# Patient Record
Sex: Male | Born: 1950
Health system: Southern US, Community
[De-identification: ages and names within clinical notes are randomized; demographics above are authoritative.]

## PROBLEM LIST (undated history)

## (undated) DIAGNOSIS — I712 Thoracic aortic aneurysm, without rupture: Secondary | ICD-10-CM

## (undated) DIAGNOSIS — K5100 Ulcerative (chronic) pancolitis without complications: Secondary | ICD-10-CM

## (undated) DIAGNOSIS — C801 Malignant (primary) neoplasm, unspecified: Secondary | ICD-10-CM

## (undated) DIAGNOSIS — F411 Generalized anxiety disorder: Secondary | ICD-10-CM

## (undated) DIAGNOSIS — Z973 Presence of spectacles and contact lenses: Secondary | ICD-10-CM

## (undated) DIAGNOSIS — H409 Unspecified glaucoma: Secondary | ICD-10-CM

## (undated) DIAGNOSIS — E785 Hyperlipidemia, unspecified: Secondary | ICD-10-CM

## (undated) DIAGNOSIS — H269 Unspecified cataract: Secondary | ICD-10-CM

## (undated) DIAGNOSIS — M8580 Other specified disorders of bone density and structure, unspecified site: Secondary | ICD-10-CM

## (undated) DIAGNOSIS — J309 Allergic rhinitis, unspecified: Secondary | ICD-10-CM

## (undated) DIAGNOSIS — W19XXXA Unspecified fall, initial encounter: Secondary | ICD-10-CM

## (undated) DIAGNOSIS — I2510 Atherosclerotic heart disease of native coronary artery without angina pectoris: Secondary | ICD-10-CM

## (undated) DIAGNOSIS — H44009 Unspecified purulent endophthalmitis, unspecified eye: Secondary | ICD-10-CM

## (undated) DIAGNOSIS — T7840XA Allergy, unspecified, initial encounter: Secondary | ICD-10-CM

## (undated) DIAGNOSIS — I7121 Aneurysm of the ascending aorta, without rupture: Secondary | ICD-10-CM

## (undated) DIAGNOSIS — R351 Nocturia: Secondary | ICD-10-CM

## (undated) DIAGNOSIS — Z87898 Personal history of other specified conditions: Secondary | ICD-10-CM

## (undated) HISTORY — DX: Hyperlipidemia, unspecified: E78.5

## (undated) HISTORY — PX: COLONOSCOPY: SHX174

## (undated) HISTORY — DX: Unspecified cataract: H26.9

## (undated) HISTORY — PX: EYE SURGERY: SHX253

## (undated) HISTORY — DX: Other specified disorders of bone density and structure, unspecified site: M85.80

## (undated) HISTORY — PX: CARDIAC CATHETERIZATION: SHX172

## (undated) HISTORY — DX: Malignant (primary) neoplasm, unspecified: C80.1

## (undated) HISTORY — DX: Generalized anxiety disorder: F41.1

## (undated) HISTORY — PX: CORONARY ANGIOPLASTY WITH STENT PLACEMENT: SHX49

## (undated) HISTORY — PX: TRANSTHORACIC ECHOCARDIOGRAM: SHX275

## (undated) HISTORY — DX: Allergy, unspecified, initial encounter: T78.40XA

## (undated) HISTORY — PX: CARDIOVASCULAR STRESS TEST: SHX262

## (undated) HISTORY — DX: Atherosclerotic heart disease of native coronary artery without angina pectoris: I25.10

## (undated) HISTORY — DX: Unspecified fall, initial encounter: W19.XXXA

## (undated) HISTORY — DX: Unspecified purulent endophthalmitis, unspecified eye: H44.009

---
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
## 1985-01-17 HISTORY — PX: SEPTOPLASTY: SUR1290

## 1998-04-15 ENCOUNTER — Ambulatory Visit (HOSPITAL_COMMUNITY): Admission: RE | Admit: 1998-04-15 | Discharge: 1998-04-15 | Payer: Self-pay | Admitting: Gastroenterology

## 1999-04-21 ENCOUNTER — Ambulatory Visit (HOSPITAL_COMMUNITY): Admission: RE | Admit: 1999-04-21 | Discharge: 1999-04-21 | Payer: Self-pay | Admitting: Gastroenterology

## 1999-04-21 ENCOUNTER — Encounter (INDEPENDENT_AMBULATORY_CARE_PROVIDER_SITE_OTHER): Payer: Self-pay | Admitting: Specialist

## 2000-04-28 ENCOUNTER — Ambulatory Visit (HOSPITAL_COMMUNITY): Admission: RE | Admit: 2000-04-28 | Discharge: 2000-04-28 | Payer: Self-pay | Admitting: Gastroenterology

## 2000-10-13 ENCOUNTER — Encounter (INDEPENDENT_AMBULATORY_CARE_PROVIDER_SITE_OTHER): Payer: Self-pay | Admitting: Specialist

## 2000-10-13 ENCOUNTER — Ambulatory Visit (HOSPITAL_COMMUNITY): Admission: RE | Admit: 2000-10-13 | Discharge: 2000-10-13 | Payer: Self-pay | Admitting: Gastroenterology

## 2002-07-25 ENCOUNTER — Ambulatory Visit (HOSPITAL_COMMUNITY): Admission: RE | Admit: 2002-07-25 | Discharge: 2002-07-26 | Payer: Self-pay | Admitting: Cardiology

## 2003-04-04 ENCOUNTER — Ambulatory Visit (HOSPITAL_BASED_OUTPATIENT_CLINIC_OR_DEPARTMENT_OTHER): Admission: RE | Admit: 2003-04-04 | Discharge: 2003-04-04 | Payer: Self-pay | Admitting: Pulmonary Disease

## 2003-12-30 ENCOUNTER — Ambulatory Visit: Payer: Self-pay | Admitting: Pulmonary Disease

## 2004-01-16 ENCOUNTER — Ambulatory Visit: Payer: Self-pay | Admitting: Internal Medicine

## 2004-01-18 HISTORY — PX: INGUINAL HERNIA REPAIR: SUR1180

## 2004-01-28 ENCOUNTER — Ambulatory Visit: Payer: Self-pay | Admitting: Pulmonary Disease

## 2004-01-30 ENCOUNTER — Ambulatory Visit: Payer: Self-pay | Admitting: Gastroenterology

## 2004-02-20 ENCOUNTER — Ambulatory Visit: Payer: Self-pay | Admitting: Gastroenterology

## 2004-02-27 ENCOUNTER — Ambulatory Visit: Payer: Self-pay | Admitting: Gastroenterology

## 2004-03-09 ENCOUNTER — Ambulatory Visit: Payer: Self-pay | Admitting: Cardiology

## 2004-03-26 ENCOUNTER — Ambulatory Visit: Payer: Self-pay | Admitting: Gastroenterology

## 2004-05-13 ENCOUNTER — Ambulatory Visit: Payer: Self-pay | Admitting: Gastroenterology

## 2004-10-25 ENCOUNTER — Ambulatory Visit (HOSPITAL_COMMUNITY): Admission: RE | Admit: 2004-10-25 | Discharge: 2004-10-25 | Payer: Self-pay | Admitting: Family Medicine

## 2004-11-24 ENCOUNTER — Encounter: Admission: RE | Admit: 2004-11-24 | Discharge: 2004-11-24 | Payer: Self-pay | Admitting: Surgery

## 2004-12-24 ENCOUNTER — Ambulatory Visit: Payer: Self-pay | Admitting: Gastroenterology

## 2005-03-08 ENCOUNTER — Encounter: Admission: RE | Admit: 2005-03-08 | Discharge: 2005-03-08 | Payer: Self-pay | Admitting: Emergency Medicine

## 2005-03-14 ENCOUNTER — Ambulatory Visit: Payer: Self-pay | Admitting: Gastroenterology

## 2005-03-18 ENCOUNTER — Encounter: Admission: RE | Admit: 2005-03-18 | Discharge: 2005-03-18 | Payer: Self-pay | Admitting: Cardiology

## 2005-03-18 ENCOUNTER — Ambulatory Visit: Payer: Self-pay | Admitting: Oncology

## 2005-03-29 ENCOUNTER — Encounter (INDEPENDENT_AMBULATORY_CARE_PROVIDER_SITE_OTHER): Payer: Self-pay | Admitting: Specialist

## 2005-03-29 ENCOUNTER — Ambulatory Visit: Payer: Self-pay | Admitting: Gastroenterology

## 2005-04-27 LAB — CBC WITH DIFFERENTIAL/PLATELET
BASO%: 1.7 % (ref 0.0–2.0)
EOS%: 3.7 % (ref 0.0–7.0)
HCT: 42.8 % (ref 38.7–49.9)
LYMPH%: 22.4 % (ref 14.0–48.0)
MCH: 36.5 pg — ABNORMAL HIGH (ref 28.0–33.4)
MCHC: 34.4 g/dL (ref 32.0–35.9)
NEUT%: 63.2 % (ref 40.0–75.0)
Platelets: 191 10*3/uL (ref 145–400)
RBC: 4.03 10*6/uL — ABNORMAL LOW (ref 4.20–5.71)

## 2006-04-17 ENCOUNTER — Ambulatory Visit: Payer: Self-pay | Admitting: Gastroenterology

## 2006-04-17 LAB — CONVERTED CEMR LAB
ALT: 12 units/L (ref 0–40)
AST: 26 units/L (ref 0–37)
Albumin: 3.8 g/dL (ref 3.5–5.2)
Alkaline Phosphatase: 47 units/L (ref 39–117)
Basophils Absolute: 0.1 10*3/uL (ref 0.0–0.1)
Basophils Relative: 1.6 % — ABNORMAL HIGH (ref 0.0–1.0)
Bilirubin, Direct: 0.1 mg/dL (ref 0.0–0.3)
Eosinophils Absolute: 0.1 10*3/uL (ref 0.0–0.6)
Eosinophils Relative: 2.8 % (ref 0.0–5.0)
HCT: 43.1 % (ref 39.0–52.0)
Hemoglobin: 15 g/dL (ref 13.0–17.0)
Lymphocytes Relative: 25.7 % (ref 12.0–46.0)
MCHC: 34.7 g/dL (ref 30.0–36.0)
MCV: 101.1 fL — ABNORMAL HIGH (ref 78.0–100.0)
Monocytes Absolute: 0.4 10*3/uL (ref 0.2–0.7)
Monocytes Relative: 8.4 % (ref 3.0–11.0)
Neutro Abs: 3.3 10*3/uL (ref 1.4–7.7)
Neutrophils Relative %: 61.5 % (ref 43.0–77.0)
Platelets: 255 10*3/uL (ref 150–400)
RBC: 4.27 M/uL (ref 4.22–5.81)
RDW: 13.2 % (ref 11.5–14.6)
Total Bilirubin: 1 mg/dL (ref 0.3–1.2)
Total Protein: 7.3 g/dL (ref 6.0–8.3)
WBC: 5.2 10*3/uL (ref 4.5–10.5)

## 2006-05-02 ENCOUNTER — Ambulatory Visit: Payer: Self-pay | Admitting: Gastroenterology

## 2006-05-02 ENCOUNTER — Encounter (INDEPENDENT_AMBULATORY_CARE_PROVIDER_SITE_OTHER): Payer: Self-pay | Admitting: Specialist

## 2006-10-20 ENCOUNTER — Ambulatory Visit: Payer: Self-pay | Admitting: Gastroenterology

## 2006-10-20 LAB — CONVERTED CEMR LAB
ALT: 24 units/L (ref 0–53)
AST: 32 units/L (ref 0–37)
Alkaline Phosphatase: 50 units/L (ref 39–117)
Basophils Absolute: 0.1 10*3/uL (ref 0.0–0.1)
Basophils Relative: 1.2 % — ABNORMAL HIGH (ref 0.0–1.0)
Bilirubin, Direct: 0.2 mg/dL (ref 0.0–0.3)
Eosinophils Absolute: 0.1 10*3/uL (ref 0.0–0.6)
Eosinophils Relative: 2.8 % (ref 0.0–5.0)
HCT: 40.1 % (ref 39.0–52.0)
Hemoglobin: 13.8 g/dL (ref 13.0–17.0)
Lymphocytes Relative: 27.1 % (ref 12.0–46.0)
MCHC: 34.3 g/dL (ref 30.0–36.0)
MCV: 101.8 fL — ABNORMAL HIGH (ref 78.0–100.0)
Monocytes Relative: 9.5 % (ref 3.0–11.0)
Neutrophils Relative %: 59.4 % (ref 43.0–77.0)
Platelets: 246 10*3/uL (ref 150–400)
RBC: 3.94 M/uL — ABNORMAL LOW (ref 4.22–5.81)
RDW: 14.6 % (ref 11.5–14.6)
Total Bilirubin: 1.4 mg/dL — ABNORMAL HIGH (ref 0.3–1.2)
Total Protein: 7.4 g/dL (ref 6.0–8.3)
WBC: 4.4 10*3/uL — ABNORMAL LOW (ref 4.5–10.5)

## 2007-01-24 ENCOUNTER — Ambulatory Visit: Payer: Self-pay | Admitting: Gastroenterology

## 2007-01-24 LAB — CONVERTED CEMR LAB
ALT: 17 units/L (ref 0–53)
AST: 26 units/L (ref 0–37)
Albumin: 4 g/dL (ref 3.5–5.2)
Alkaline Phosphatase: 43 units/L (ref 39–117)
Basophils Absolute: 0 10*3/uL (ref 0.0–0.1)
Basophils Relative: 0 % (ref 0.0–1.0)
Bilirubin, Direct: 0.3 mg/dL (ref 0.0–0.3)
Eosinophils Absolute: 0.1 10*3/uL (ref 0.0–0.6)
Eosinophils Relative: 2.1 % (ref 0.0–5.0)
HCT: 40.8 % (ref 39.0–52.0)
Hemoglobin: 14.6 g/dL (ref 13.0–17.0)
Lymphocytes Relative: 18.6 % (ref 12.0–46.0)
MCHC: 35.9 g/dL (ref 30.0–36.0)
MCV: 103.1 fL — ABNORMAL HIGH (ref 78.0–100.0)
Monocytes Absolute: 0.3 10*3/uL (ref 0.2–0.7)
Monocytes Relative: 5.7 % (ref 3.0–11.0)
Neutro Abs: 4.5 10*3/uL (ref 1.4–7.7)
Neutrophils Relative %: 73.6 % (ref 43.0–77.0)
Platelets: 194 10*3/uL (ref 150–400)
RBC: 3.96 M/uL — ABNORMAL LOW (ref 4.22–5.81)
RDW: 15.8 % — ABNORMAL HIGH (ref 11.5–14.6)
Total Bilirubin: 2 mg/dL — ABNORMAL HIGH (ref 0.3–1.2)
Total Protein: 7.3 g/dL (ref 6.0–8.3)
WBC: 6 10*3/uL (ref 4.5–10.5)

## 2007-04-03 ENCOUNTER — Encounter: Admission: RE | Admit: 2007-04-03 | Discharge: 2007-04-03 | Payer: Self-pay | Admitting: Family Medicine

## 2007-05-17 ENCOUNTER — Ambulatory Visit: Payer: Self-pay | Admitting: Gastroenterology

## 2007-05-17 DIAGNOSIS — K5190 Ulcerative colitis, unspecified, without complications: Secondary | ICD-10-CM | POA: Insufficient documentation

## 2007-05-17 LAB — CONVERTED CEMR LAB
ALT: 18 units/L (ref 0–53)
AST: 26 units/L (ref 0–37)
Albumin: 4.3 g/dL (ref 3.5–5.2)
Alkaline Phosphatase: 48 units/L (ref 39–117)
Basophils Absolute: 0 10*3/uL (ref 0.0–0.1)
Basophils Relative: 0.8 % (ref 0.0–1.0)
Bilirubin, Direct: 0.1 mg/dL (ref 0.0–0.3)
Eosinophils Absolute: 0.1 10*3/uL (ref 0.0–0.7)
Eosinophils Relative: 2.8 % (ref 0.0–5.0)
HCT: 41.6 % (ref 39.0–52.0)
Hemoglobin: 14.1 g/dL (ref 13.0–17.0)
Lymphocytes Relative: 23.3 % (ref 12.0–46.0)
MCHC: 33.9 g/dL (ref 30.0–36.0)
MCV: 108.9 fL — ABNORMAL HIGH (ref 78.0–100.0)
Monocytes Absolute: 0.3 10*3/uL (ref 0.1–1.0)
Monocytes Relative: 6.2 % (ref 3.0–12.0)
Neutro Abs: 3.1 10*3/uL (ref 1.4–7.7)
Neutrophils Relative %: 66.9 % (ref 43.0–77.0)
Platelets: 216 10*3/uL (ref 150–400)
RBC: 3.82 M/uL — ABNORMAL LOW (ref 4.22–5.81)
RDW: 15.7 % — ABNORMAL HIGH (ref 11.5–14.6)
Total Bilirubin: 1.5 mg/dL — ABNORMAL HIGH (ref 0.3–1.2)
Total Protein: 7.6 g/dL (ref 6.0–8.3)
WBC: 4.6 10*3/uL (ref 4.5–10.5)

## 2007-06-07 ENCOUNTER — Encounter: Payer: Self-pay | Admitting: Gastroenterology

## 2007-06-07 ENCOUNTER — Ambulatory Visit: Payer: Self-pay | Admitting: Gastroenterology

## 2007-06-13 ENCOUNTER — Encounter: Payer: Self-pay | Admitting: Gastroenterology

## 2007-08-06 ENCOUNTER — Telehealth: Payer: Self-pay | Admitting: Gastroenterology

## 2007-08-13 ENCOUNTER — Encounter: Payer: Self-pay | Admitting: Gastroenterology

## 2007-08-13 ENCOUNTER — Telehealth: Payer: Self-pay | Admitting: Gastroenterology

## 2007-08-15 ENCOUNTER — Encounter: Payer: Self-pay | Admitting: Gastroenterology

## 2007-11-07 ENCOUNTER — Telehealth: Payer: Self-pay | Admitting: Gastroenterology

## 2007-11-27 ENCOUNTER — Emergency Department (HOSPITAL_COMMUNITY): Admission: EM | Admit: 2007-11-27 | Discharge: 2007-11-27 | Payer: Self-pay | Admitting: Emergency Medicine

## 2007-12-03 ENCOUNTER — Telehealth: Payer: Self-pay | Admitting: Gastroenterology

## 2007-12-03 ENCOUNTER — Encounter (HOSPITAL_COMMUNITY): Admission: RE | Admit: 2007-12-03 | Discharge: 2008-01-17 | Payer: Self-pay | Admitting: Emergency Medicine

## 2008-01-02 ENCOUNTER — Encounter: Payer: Self-pay | Admitting: Internal Medicine

## 2008-01-30 ENCOUNTER — Telehealth: Payer: Self-pay | Admitting: Gastroenterology

## 2008-06-03 ENCOUNTER — Encounter (INDEPENDENT_AMBULATORY_CARE_PROVIDER_SITE_OTHER): Payer: Self-pay | Admitting: *Deleted

## 2008-07-01 ENCOUNTER — Ambulatory Visit: Payer: Self-pay | Admitting: Gastroenterology

## 2008-07-01 ENCOUNTER — Telehealth: Payer: Self-pay | Admitting: Gastroenterology

## 2008-07-01 LAB — CONVERTED CEMR LAB
ALT: 36 units/L (ref 0–53)
AST: 34 units/L (ref 0–37)
Albumin: 3.9 g/dL (ref 3.5–5.2)
Alkaline Phosphatase: 38 units/L — ABNORMAL LOW (ref 39–117)
Basophils Absolute: 0.1 10*3/uL (ref 0.0–0.1)
Basophils Relative: 1.4 % (ref 0.0–3.0)
Bilirubin, Direct: 0.3 mg/dL (ref 0.0–0.3)
Eosinophils Absolute: 0.1 10*3/uL (ref 0.0–0.7)
Eosinophils Relative: 2.1 % (ref 0.0–5.0)
HCT: 39.4 % (ref 39.0–52.0)
Hemoglobin: 13.9 g/dL (ref 13.0–17.0)
Lymphocytes Relative: 19.1 % (ref 12.0–46.0)
Lymphs Abs: 1 10*3/uL (ref 0.7–4.0)
MCHC: 35.3 g/dL (ref 30.0–36.0)
MCV: 111.7 fL — ABNORMAL HIGH (ref 78.0–100.0)
Monocytes Absolute: 0.3 10*3/uL (ref 0.1–1.0)
Monocytes Relative: 5.7 % (ref 3.0–12.0)
Neutro Abs: 3.5 10*3/uL (ref 1.4–7.7)
Neutrophils Relative %: 71.7 % (ref 43.0–77.0)
Platelets: 161 10*3/uL (ref 150.0–400.0)
RBC: 3.52 M/uL — ABNORMAL LOW (ref 4.22–5.81)
RDW: 15.1 % — ABNORMAL HIGH (ref 11.5–14.6)
Total Bilirubin: 1.8 mg/dL — ABNORMAL HIGH (ref 0.3–1.2)
Total Protein: 7 g/dL (ref 6.0–8.3)
WBC: 5 10*3/uL (ref 4.5–10.5)

## 2008-09-02 ENCOUNTER — Ambulatory Visit: Payer: Self-pay | Admitting: Gastroenterology

## 2008-09-12 ENCOUNTER — Telehealth: Payer: Self-pay | Admitting: Gastroenterology

## 2008-10-10 ENCOUNTER — Encounter: Payer: Self-pay | Admitting: Gastroenterology

## 2008-10-10 ENCOUNTER — Ambulatory Visit: Payer: Self-pay | Admitting: Gastroenterology

## 2008-10-10 LAB — HM COLONOSCOPY

## 2008-10-15 ENCOUNTER — Encounter: Payer: Self-pay | Admitting: Gastroenterology

## 2008-12-02 ENCOUNTER — Telehealth: Payer: Self-pay | Admitting: Gastroenterology

## 2008-12-17 ENCOUNTER — Telehealth: Payer: Self-pay | Admitting: Gastroenterology

## 2008-12-19 ENCOUNTER — Encounter: Payer: Self-pay | Admitting: Internal Medicine

## 2008-12-19 ENCOUNTER — Encounter: Payer: Self-pay | Admitting: Gastroenterology

## 2008-12-19 LAB — CONVERTED CEMR LAB
ALT: 30 units/L
AST: 35 units/L
Albumin: 4.3 g/dL
Alkaline Phosphatase: 30 units/L
BUN: 15 mg/dL
Basophils Relative: 1.4 %
CO2: 29 meq/L
Calcium: 9 mg/dL
Chloride: 103 meq/L
Cholesterol: 165 mg/dL
Creatinine, Ser: 0.9 mg/dL
Eosinophils Relative: 3.5 %
Glucose, Bld: 89 mg/dL
HCT: 40.7 %
HDL: 61 mg/dL
Hemoglobin: 14 g/dL
LDL Cholesterol: 89 mg/dL
Lymphocytes, automated: 28.5 %
MCV: 110.3 fL
Monocytes Relative: 4 %
Neutrophils Relative %: 62.6 %
PSA: 2.66 ng/mL
Platelets: 181 10*3/uL
Potassium: 3.8 meq/L
RBC: 3.69 M/uL
RDW: 15.6 %
Sodium: 139 meq/L
TSH: 1.5 microintl units/mL
Total Bilirubin: 1.2 mg/dL
Total Protein: 7 g/dL
Triglyceride fasting, serum: 46 mg/dL
WBC: 3.9 10*3/uL

## 2008-12-30 ENCOUNTER — Telehealth: Payer: Self-pay | Admitting: Gastroenterology

## 2009-01-13 ENCOUNTER — Encounter: Payer: Self-pay | Admitting: Internal Medicine

## 2009-03-16 ENCOUNTER — Telehealth: Payer: Self-pay | Admitting: Gastroenterology

## 2009-03-23 ENCOUNTER — Telehealth: Payer: Self-pay | Admitting: Gastroenterology

## 2009-05-15 ENCOUNTER — Ambulatory Visit: Payer: Self-pay | Admitting: Internal Medicine

## 2009-05-15 DIAGNOSIS — M8580 Other specified disorders of bone density and structure, unspecified site: Secondary | ICD-10-CM | POA: Insufficient documentation

## 2009-05-15 DIAGNOSIS — M810 Age-related osteoporosis without current pathological fracture: Secondary | ICD-10-CM | POA: Insufficient documentation

## 2009-05-15 DIAGNOSIS — F411 Generalized anxiety disorder: Secondary | ICD-10-CM | POA: Insufficient documentation

## 2009-05-18 ENCOUNTER — Ambulatory Visit: Payer: Self-pay | Admitting: Internal Medicine

## 2009-05-18 LAB — CONVERTED CEMR LAB
ALT: 33 units/L (ref 0–53)
AST: 33 units/L (ref 0–37)
Albumin: 4.2 g/dL (ref 3.5–5.2)
Alkaline Phosphatase: 37 units/L — ABNORMAL LOW (ref 39–117)
BUN: 13 mg/dL (ref 6–23)
Basophils Absolute: 0 10*3/uL (ref 0.0–0.1)
Basophils Relative: 0.3 % (ref 0.0–3.0)
Bilirubin Urine: NEGATIVE
Bilirubin, Direct: 0.2 mg/dL (ref 0.0–0.3)
CO2: 32 meq/L (ref 19–32)
Calcium: 9.2 mg/dL (ref 8.4–10.5)
Chloride: 106 meq/L (ref 96–112)
Cholesterol: 157 mg/dL (ref 0–200)
Creatinine, Ser: 1 mg/dL (ref 0.4–1.5)
Eosinophils Absolute: 0.1 10*3/uL (ref 0.0–0.7)
Eosinophils Relative: 3.4 % (ref 0.0–5.0)
GFR calc non Af Amer: 81.82 mL/min (ref >60)
Glucose, Bld: 92 mg/dL (ref 70–99)
HCT: 38.8 % — ABNORMAL LOW (ref 39.0–52.0)
HDL: 45.9 mg/dL (ref >39.00)
Hemoglobin: 13.6 g/dL (ref 13.0–17.0)
Ketones, ur: NEGATIVE mg/dL
LDL Cholesterol: 98 mg/dL (ref 0–99)
Leukocytes, UA: NEGATIVE
Lymphocytes Relative: 27.3 % (ref 12.0–46.0)
Lymphs Abs: 0.8 10*3/uL (ref 0.7–4.0)
MCHC: 35 g/dL (ref 30.0–36.0)
MCV: 111.6 fL — ABNORMAL HIGH (ref 78.0–100.0)
Monocytes Absolute: 0.2 10*3/uL (ref 0.1–1.0)
Monocytes Relative: 6.7 % (ref 3.0–12.0)
Neutro Abs: 1.9 10*3/uL (ref 1.4–7.7)
Neutrophils Relative %: 62.3 % (ref 43.0–77.0)
Nitrite: NEGATIVE
PSA: 2.59 ng/mL (ref 0.10–4.00)
Platelets: 203 10*3/uL (ref 150.0–400.0)
Potassium: 4 meq/L (ref 3.5–5.1)
RBC: 3.47 M/uL — ABNORMAL LOW (ref 4.22–5.81)
RDW: 16.6 % — ABNORMAL HIGH (ref 11.5–14.6)
Sodium: 143 meq/L (ref 135–145)
Specific Gravity, Urine: 1.025 (ref 1.000–1.030)
TSH: 1.36 microintl units/mL (ref 0.35–5.50)
Total Bilirubin: 1.3 mg/dL — ABNORMAL HIGH (ref 0.3–1.2)
Total CHOL/HDL Ratio: 3
Total Protein, Urine: NEGATIVE mg/dL
Total Protein: 6.7 g/dL (ref 6.0–8.3)
Triglycerides: 64 mg/dL (ref 0.0–149.0)
Urine Glucose: NEGATIVE mg/dL
Urobilinogen, UA: 0.2 (ref 0.0–1.0)
VLDL: 12.8 mg/dL (ref 0.0–40.0)
WBC: 3.1 10*3/uL — ABNORMAL LOW (ref 4.5–10.5)
pH: 5.5 (ref 5.0–8.0)

## 2009-05-20 ENCOUNTER — Telehealth (INDEPENDENT_AMBULATORY_CARE_PROVIDER_SITE_OTHER): Payer: Self-pay | Admitting: *Deleted

## 2009-06-02 ENCOUNTER — Telehealth (INDEPENDENT_AMBULATORY_CARE_PROVIDER_SITE_OTHER): Payer: Self-pay | Admitting: *Deleted

## 2009-06-04 ENCOUNTER — Encounter: Payer: Self-pay | Admitting: Internal Medicine

## 2009-06-04 DIAGNOSIS — Z9889 Other specified postprocedural states: Secondary | ICD-10-CM | POA: Insufficient documentation

## 2009-06-04 DIAGNOSIS — E559 Vitamin D deficiency, unspecified: Secondary | ICD-10-CM | POA: Insufficient documentation

## 2009-06-04 DIAGNOSIS — D509 Iron deficiency anemia, unspecified: Secondary | ICD-10-CM | POA: Insufficient documentation

## 2009-06-04 DIAGNOSIS — J309 Allergic rhinitis, unspecified: Secondary | ICD-10-CM | POA: Insufficient documentation

## 2009-06-04 DIAGNOSIS — K219 Gastro-esophageal reflux disease without esophagitis: Secondary | ICD-10-CM | POA: Insufficient documentation

## 2009-06-08 ENCOUNTER — Telehealth: Payer: Self-pay | Admitting: Internal Medicine

## 2009-06-11 ENCOUNTER — Encounter: Payer: Self-pay | Admitting: Cardiology

## 2009-06-12 ENCOUNTER — Ambulatory Visit: Payer: Self-pay | Admitting: Cardiology

## 2009-06-12 DIAGNOSIS — E7800 Pure hypercholesterolemia, unspecified: Secondary | ICD-10-CM | POA: Insufficient documentation

## 2009-06-12 DIAGNOSIS — E785 Hyperlipidemia, unspecified: Secondary | ICD-10-CM | POA: Insufficient documentation

## 2009-06-12 HISTORY — DX: Pure hypercholesterolemia, unspecified: E78.00

## 2009-08-07 ENCOUNTER — Ambulatory Visit: Payer: Self-pay | Admitting: Cardiology

## 2009-08-13 LAB — CONVERTED CEMR LAB
ALT: 26 units/L (ref 0–53)
AST: 34 units/L (ref 0–37)
Albumin: 4 g/dL (ref 3.5–5.2)
Alkaline Phosphatase: 39 units/L (ref 39–117)
Bilirubin, Direct: 0.2 mg/dL (ref 0.0–0.3)
Cholesterol: 139 mg/dL (ref 0–200)
HDL: 53.4 mg/dL (ref >39.00)
LDL Cholesterol: 80 mg/dL (ref 0–99)
Total Bilirubin: 1 mg/dL (ref 0.3–1.2)
Total CHOL/HDL Ratio: 3
Total Protein: 6.7 g/dL (ref 6.0–8.3)
Triglycerides: 28 mg/dL (ref 0.0–149.0)
VLDL: 5.6 mg/dL (ref 0.0–40.0)

## 2009-09-23 ENCOUNTER — Ambulatory Visit: Payer: Self-pay

## 2009-09-23 ENCOUNTER — Ambulatory Visit: Payer: Self-pay | Admitting: Internal Medicine

## 2009-09-23 DIAGNOSIS — M79609 Pain in unspecified limb: Secondary | ICD-10-CM | POA: Insufficient documentation

## 2009-10-07 ENCOUNTER — Encounter: Payer: Self-pay | Admitting: Gastroenterology

## 2009-10-07 ENCOUNTER — Encounter: Payer: Self-pay | Admitting: Internal Medicine

## 2009-12-07 ENCOUNTER — Telehealth: Payer: Self-pay | Admitting: Gastroenterology

## 2009-12-14 ENCOUNTER — Telehealth: Payer: Self-pay | Admitting: Gastroenterology

## 2009-12-21 ENCOUNTER — Ambulatory Visit: Payer: Self-pay | Admitting: Internal Medicine

## 2009-12-21 DIAGNOSIS — J019 Acute sinusitis, unspecified: Secondary | ICD-10-CM | POA: Insufficient documentation

## 2010-01-19 ENCOUNTER — Telehealth: Payer: Self-pay | Admitting: Gastroenterology

## 2010-01-19 ENCOUNTER — Encounter: Payer: Self-pay | Admitting: Gastroenterology

## 2010-01-19 ENCOUNTER — Ambulatory Visit
Admission: RE | Admit: 2010-01-19 | Discharge: 2010-01-19 | Payer: Self-pay | Source: Home / Self Care | Attending: Gastroenterology | Admitting: Gastroenterology

## 2010-01-19 ENCOUNTER — Encounter: Payer: Self-pay | Admitting: Cardiology

## 2010-01-19 LAB — CONVERTED CEMR LAB
BUN: 12 mg/dL (ref 6–23)
Basophils Absolute: 0 10*3/uL (ref 0.0–0.1)
Basophils Relative: 0.5 % (ref 0.0–3.0)
CO2: 32 meq/L (ref 19–32)
Calcium: 9.2 mg/dL (ref 8.4–10.5)
Chloride: 104 meq/L (ref 96–112)
Creatinine, Ser: 1 mg/dL (ref 0.4–1.5)
Eosinophils Relative: 5.3 % — ABNORMAL HIGH (ref 0.0–5.0)
Glucose, Bld: 84 mg/dL (ref 70–99)
HCT: 41.6 % (ref 39.0–52.0)
Hemoglobin: 14.4 g/dL (ref 13.0–17.0)
Lymphocytes Relative: 23.6 % (ref 12.0–46.0)
Lymphs Abs: 0.9 10*3/uL (ref 0.7–4.0)
MCV: 111 fL — ABNORMAL HIGH (ref 78.0–100.0)
Monocytes Absolute: 0.3 10*3/uL (ref 0.1–1.0)
Monocytes Relative: 7.1 % (ref 3.0–12.0)
Neutro Abs: 2.3 10*3/uL (ref 1.4–7.7)
Neutrophils Relative %: 63.5 % (ref 43.0–77.0)
Platelets: 156 10*3/uL (ref 150.0–400.0)
Potassium: 4.4 meq/L (ref 3.5–5.1)
RBC: 3.74 M/uL — ABNORMAL LOW (ref 4.22–5.81)
Sodium: 141 meq/L (ref 135–145)
WBC: 3.7 10*3/uL — ABNORMAL LOW (ref 4.5–10.5)

## 2010-01-21 ENCOUNTER — Encounter: Payer: Self-pay | Admitting: Cardiology

## 2010-02-04 ENCOUNTER — Other Ambulatory Visit: Payer: Self-pay | Admitting: Gastroenterology

## 2010-02-04 ENCOUNTER — Ambulatory Visit
Admission: RE | Admit: 2010-02-04 | Discharge: 2010-02-04 | Payer: Self-pay | Source: Home / Self Care | Attending: Gastroenterology | Admitting: Gastroenterology

## 2010-02-10 ENCOUNTER — Encounter: Payer: Self-pay | Admitting: Gastroenterology

## 2010-02-11 ENCOUNTER — Ambulatory Visit
Admission: RE | Admit: 2010-02-11 | Discharge: 2010-02-11 | Payer: Self-pay | Source: Home / Self Care | Attending: Cardiology | Admitting: Cardiology

## 2010-02-16 NOTE — Progress Notes (Signed)
Summary: Needs refill/PT PAST DUE FOR COLONOSCOPY  Medications Added PENTASA 500 MG CR-CAPS (MESALAMINE) 2 tablets by mouth four times a day       Phone Note Call from Patient Call back at Home Phone [phone]   Caller: Patient Call For: Dr. Arlyce Dice Reason for Call: Refill Medication Details for Reason: Refill Summary of Call: Pt. needs a refill for a 14-month supply of Pentasa faxed to [facility] [phone]. It will need to include his name, date of birth, and member ID# which is [id number].  Thank you. Initial call taken by: Schuyler Amor,  December 07, 2009 1:12 PM  Follow-up for Phone Call        Called pt to inform he is past due on Colonoscopy. And to follow up office appointment, L/M for pt to return call if he has any questions Follow-up by: Merri Ray CMA Duncan Dull),  December 07, 2009 4:21 PM    New/Updated Medications: PENTASA 500 MG CR-CAPS (MESALAMINE) 2 tablets by mouth four times a day Prescriptions: PENTASA 500 MG CR-CAPS (MESALAMINE) 2 tablets by mouth four times a day  #720 x 3   Entered by:   Merri Ray CMA (AAMA)   Authorized by:   Louis Meckel MD   Signed by:   Merri Ray CMA (AAMA) on 12/07/2009   Method used:   Faxed to ...       [facility] Environmental education officer)       [address] [address]       [place], [place]  [address]       Ph: [phone]       Fax: [phone]   RxID:   [id number]

## 2010-02-16 NOTE — Assessment & Plan Note (Signed)
Summary: SINUS PROBLEM--CHILLS --SORE THROAT  STC   Vital Signs:  Patient profile:   60 year old male Height:      68.5 inches (173.99 cm) Weight:      156.4 pounds (71.09 kg) O2 Sat:      96 % on Room air Temp:     99.0 degrees F (37.22 degrees C) oral Pulse rate:   74 / minute BP sitting:   110 / 80  (left arm) Cuff size:   regular  Vitals Entered By: Tomma Lightning RMA (December 21, 2009 11:30 AM)  O2 Flow:  Room air CC: sinus/chills/sore throat Is Patient Diabetic? No Pain Assessment Patient in pain? no        Primary Care Provider:  Rowe Clack MD  CC:  sinus/chills/sore throat.  History of Present Illness: c/o sinus pressure onset 4 days ago located above and behind both eyes assoc with chills but no fever, green nasal discharge and ST +sick contacts with similar symptoms  no fever, no HA  symptoms not improved with otc meds thus far   also review of other med issues:  CAD -  follows annually with cardiologist Ron Parker),  no CP or anginal symptoms - 100% compliant with meds  UC - no flares since 2009 - 100% compliant with meds as rx'd by GI - no adv se on current tx  osteoporosis - no bone pains - related to prior steroid use - not on fosamax for years and does not take Ca or vit d regularly  Current Medications (verified): 1)  Azathioprine 50 Mg  Tabs (Azathioprine) .... Take 3 1/2 Tablets Daily( 179m Per Day) 2)  Pentasa 500 Mg Cr-Caps (Mesalamine) .... 2 Tablets By Mouth Four Times A Day 3)  Lexapro 10 Mg Tabs (Escitalopram Oxalate) ..Marland Kitchen. 1 Tablet By Mouth Once Daily 4)  Aspirin 81 Mg Tbec (Aspirin) ..Marland Kitchen. 1 Tablet By Mouth Once Daily 5)  Niacin 500 Mg Tabs (Niacin) .... 2 Tablets By Mouth Once Daily 6)  Multivitamins  Tabs (Multiple Vitamin) ..Marland Kitchen. 1 Tablet By Mouth Once Daily 7)  B Complex  Tabs (B Complex Vitamins) ..Marland Kitchen. 1 Tablet By Mouth Once Daily 8)  Plavix 75 Mg Tabs (Clopidogrel Bisulfate) ..Marland Kitchen. 1 Tablet By Mouth Once Daily 9)  Eql Coq10 300 Mg Caps  (Coenzyme Q10) .... Once Daily 10)  Oscal 500/200 D-3 500-200 Mg-Unit Tabs (Calcium-Vitamin D) ..Marland Kitchen. 1 By Mouth Once Daily 11)  Simvastatin 10 Mg Tabs (Simvastatin) .... Take One Tablet By Mouth Daily At Bedtime  Allergies (verified): 1)  ! Demerol  Past History:  Past Medical History: CAD   Cypher stent..Marland KitchenLAD - 2004 (seen last cardiology 2006) /  nuclear scan... 2005... excellent tolerance... no significant scar or ischemia... EF 51% Ulcerative Colitis anxiety osteopenia - T score -2.1 Ulcerative colitis LV.. normal by history   GERD Allergic rhinitis  MD roster: GI - kaplan cards - prev torelli, now [place] - Kaur  optho - cohen derm - gruber  Review of Systems  The patient denies weight loss, vision loss, decreased hearing, hoarseness, chest pain, syncope, dyspnea on exertion, peripheral edema, prolonged cough, and headaches.    Physical Exam  General:  alert, well-developed, well-nourished, and cooperative to examination.   mild ill Head:  tenderness to palp over frontal sinuses Eyes:  vision grossly intact; pupils equal, round and reactive to light.  conjunctiva and lids normal.    Ears:  normal pinnae bilaterally, without erythema, swelling, or tenderness to  palpation. TMs clear, without effusion, or cerumen impaction. Hearing grossly normal bilaterally  Mouth:  teeth and gums in good repair; mucous membranes moist, without lesions or ulcers. oropharynx clear without exudate, mild erythema.  Lungs:  normal respiratory effort, no intercostal retractions or use of accessory muscles; normal breath sounds bilaterally - no crackles and no wheezes.    Heart:  normal rate, regular rhythm, no murmur, and no rub. BLE without edema.   Impression & Recommendations:  Problem # 1:  ACUTE SINUSITIS, UNSPECIFIED (ICD-461.9)  emperic levaquin - also rec otc meds - mucinex dm or tylenol cold and sinus Instructed on treatment. Call if symptoms persist or worsen.   His updated  medication list for this problem includes:    Levaquin 500 Mg Tabs (Levofloxacin) .Marland Kitchen... 1 by mouth once daily x 7days    Tylenol Cold Head Congestion 05-18-08-325 Mg Tabs (Phenyleph-cpm-dm-apap) .Marland Kitchen... As directed  Complete Medication List: 1)  Azathioprine 50 Mg Tabs (Azathioprine) .... Take 3 1/2 tablets daily( 134m per day) 2)  Pentasa 500 Mg Cr-caps (Mesalamine) .... 2 tablets by mouth four times a day 3)  Lexapro 10 Mg Tabs (Escitalopram oxalate) ..Marland Kitchen. 1 tablet by mouth once daily 4)  Aspirin 81 Mg Tbec (Aspirin) ..Marland Kitchen. 1 tablet by mouth once daily 5)  Niacin 500 Mg Tabs (Niacin) .... 2 tablets by mouth once daily 6)  Multivitamins Tabs (Multiple vitamin) ..Marland Kitchen. 1 tablet by mouth once daily 7)  B Complex Tabs (B complex vitamins) ..Marland Kitchen. 1 tablet by mouth once daily 8)  Plavix 75 Mg Tabs (Clopidogrel bisulfate) ..Marland Kitchen. 1 tablet by mouth once daily 9)  Eql Coq10 300 Mg Caps (Coenzyme q10) .... Once daily 10)  Oscal 500/200 D-3 500-200 Mg-unit Tabs (Calcium-vitamin d) ..Marland Kitchen. 1 by mouth once daily 11)  Simvastatin 10 Mg Tabs (Simvastatin) .... Take one tablet by mouth daily at bedtime 12)  Levaquin 500 Mg Tabs (Levofloxacin) ..Marland Kitchen. 1 by mouth once daily x 7days 13)  Tylenol Cold Head Congestion 05-18-08-325 Mg Tabs (Phenyleph-cpm-dm-apap) .... As directed  Patient Instructions: 1)  it was good to see you today. 2)  Levaquin as discussed for sinus symptoms - also Mucinex DM or other OTC cold formulation 3)  Get plenty of rest, drink lots of clear liquids, and use Tylenol or Ibuprofen for fever and comfort. Return in 7-10 days if you're not better:sooner if you're feeling worse. Prescriptions: LEVAQUIN 500 MG TABS (LEVOFLOXACIN) 1 by mouth once daily x 7days  #7 x 0   Entered and Authorized by:   VRowe ClackMD   Signed by:   VRowe ClackMD on 12/21/2009   Method used:   Print then Give to Patient   RxID:   [id number]   Orders Added: 1)  Est. Patient Level IV [[95284]

## 2010-02-16 NOTE — Assessment & Plan Note (Signed)
Summary: ec6/cad last seen 06  Medications Added EQL COQ10 300 MG CAPS (COENZYME Q10) once daily OSCAL 500/200 D-3 500-200 MG-UNIT TABS (CALCIUM-VITAMIN D) 1 by mouth once daily DOXYCYCLINE HYCLATE 100 MG CAPS (DOXYCYCLINE HYCLATE) once daily PROBIOTIC  CAPS (PROBIOTIC PRODUCT) once daily SIMVASTATIN 10 MG TABS (SIMVASTATIN) Take one tablet by mouth daily at bedtime        Visit Type:  reestablish cardiac care Primary Provider:  Rowe Clack MD  CC:  CAD.  History of Present Illness: The patient is seen to reestablish his cardiac care with our office.  He has known coronary disease.  He received a Cypher stent in 2004.  Nuclear scan in 2005 revealed excellent tolerance with no ischemia.  Ejection fraction was in the low 50% range in the past.  Over the past several years he has seen Dr.Torelli.  He had nuclear stress studies and echo stress studies.  The last one was done approximately 2 years ago.  He had no significant ischemia.  I do not have his left ventricular function data but I can assume that is normal.  Patient has a problem with chronic mild fatigue.  Etiology is unknown.  There is question that it may be related to his ulcerative colitis but this is not proven.  Currently he is not having any significant chest pain or shortness of breath.  Current Medications (verified): 1)  Azathioprine 50 Mg  Tabs (Azathioprine) .... Take 3 1/2 Tablets Daily( 165m Per Day) 2)  Pentasa 500 Mg Cr-Caps (Mesalamine) .... 2 Tablets By Mouth Four Times A Day 3)  Lexapro 10 Mg Tabs (Escitalopram Oxalate) ..Marland Kitchen. 1 Tablet By Mouth Once Daily 4)  Aspirin 81 Mg Tbec (Aspirin) ..Marland Kitchen. 1 Tablet By Mouth Once Daily 5)  Red Yeast Rice Extract 600 Mg Caps (Red Yeast Rice Extract) .... Take 4 By Mouth Once Daily 6)  Niacin 500 Mg Tabs (Niacin) .... 2 Tablets By Mouth Once Daily 7)  Multivitamins  Tabs (Multiple Vitamin) ..Marland Kitchen. 1 Tablet By Mouth Once Daily 8)  B Complex  Tabs (B Complex Vitamins) ..Marland Kitchen. 1  Tablet By Mouth Once Daily 9)  Plavix 75 Mg Tabs (Clopidogrel Bisulfate) ..Marland Kitchen. 1 Tablet By Mouth Once Daily 10)  Eql Coq10 300 Mg Caps (Coenzyme Q10) .... Once Daily 11)  Oscal 500/200 D-3 500-200 Mg-Unit Tabs (Calcium-Vitamin D) ..Marland Kitchen. 1 By Mouth Once Daily 12)  Doxycycline Hyclate 100 Mg Caps (Doxycycline Hyclate) .... Once Daily 13)  Probiotic  Caps (Probiotic Product) .... Once Daily  Allergies: 1)  ! Demerol  Past History:  Past Medical History: Last updated: 06/11/2009 CAD   Cypher stent..Marland KitchenLAD - 2004 (seen last cardiology 2006) /  nuclear scan... 2005... excellent tolerance... no significant scar or ischemia... EF 51% Ulcerative Colitis anxiety osteopenia - T score -2.1 Ulcerative colitis LV.. normal by history  MD rooster: GI - kaplan cards - torelli, (nishan) psyc - Kaur  optho - cohen derm - gruber  GERD Allergic rhinitis  Review of Systems       Patient denies fever, chills, headache, sweats, rash, change in vision, change in hearing, chest pain, cough, nausea or vomiting, urinary symptoms.  All other systems are reviewed and are negative  Vital Signs:  Patient profile:   60year old male Height:      68.5 inches Weight:      151 pounds BMI:     22.71 Pulse rate:   64 / minute BP sitting:   118 / 52  (  left arm)  Vitals Entered By: Margaretmary Bayley CMA (Jun 12, 2009 4:10 PM)  Physical Exam  General:  patient is stable in general. Head:  head is atraumatic. Eyes:  no xanthelasma. Neck:  no jugular venous stent. Chest Wall:  no chest wall tenderness. Lungs:  lungs are clear.  Respiratory effort is nonlabored. Heart:  cardiac exam reveals S1-S2.  No clicks or significant murmurs. Abdomen:  abdomen is soft. Msk:  no musculoskeletal deformities. Extremities:  no peripheral edema. Skin:  no skin rashes. Psych:  patient is oriented to person time and place.  Affect is normal.   Impression & Recommendations:  Problem # 1:  CAD (ICD-414.00)  His updated  medication list for this problem includes:    Aspirin 81 Mg Tbec (Aspirin) .Marland Kitchen... 1 tablet by mouth once daily    Plavix 75 Mg Tabs (Clopidogrel bisulfate) .Marland Kitchen... 1 tablet by mouth once daily The patient has documented coronary disease.  He has had stress tests over the past several years.  The last was approximately 2 years ago.  He is active and not having any significant symptoms.  I have reviewed EKG done previously on May 15 2009.  It is normal.  He does not need further workup at this time.  We do need to be more aggressive with his lipid treatment.  Problem # 2:  DYSLIPIDEMIA (ICD-272.4) The patient follows a good diet.  He had some difficulty with Advicor in the past.  He has used radiation arise.  The guidelines have changed over the years and if possible I would like for his LDL to be closer to 70.  This in mind we will try a small dose of simvastatin to see how he responds.  Problem # 3:  FATIGUE (ICD-780.79) I believe that his fatigue is not related to his cardiovascular system.  No further workup.  Patient Instructions: 1)  Start Simvastatin 55m daily 2)  Your physician recommends that you return for a FASTING lipid and liver profile: in 6 weeks (414.01, 272.2) 3)  Follow up in 1 year Prescriptions: SIMVASTATIN 10 MG TABS (SIMVASTATIN) Take one tablet by mouth daily at bedtime  #30 x 6   Entered by:   HKevan Rosebush RN   Authorized by:   JLorenza Evangelist MD, [facility]  Signed by:   HKevan Rosebush RN on 06/12/2009   Method used:   Electronically to        [id number] (retail)       [address]      [place]  [address]      Ph: [phone] [phone]      Fax: [phone]  RxID:   [id number]

## 2010-02-16 NOTE — Progress Notes (Signed)
Summary: Disability Form   Phone Note Call from Patient Call back at Home Phone [phone]   Caller: Patient Call For: Dr. Arlyce Dice Reason for Call: Talk to Nurse Summary of Call: Calling about a disability form he dropped off 2 weeks ago Initial call taken by: Karna Christmas,  December 14, 2009 12:09 PM  Follow-up for Phone Call        Informed pt that we have the paperwork and I will get Dr Arlyce Dice to singn tomorrow. Dr Arlyce Dice was off for 10 days Follow-up by: Merri Ray CMA Duncan Dull),  December 14, 2009 1:54 PM  Additional Follow-up for Phone Call Additional follow up Details #1::        Papers are signed and hand delivered to Medical Records. Additional Follow-up by: Merri Ray CMA (AAMA),  December 15, 2009 9:30 AM

## 2010-02-16 NOTE — Progress Notes (Signed)
Summary: Refill  Medications Added AZATHIOPRINE 50 MG  TABS (AZATHIOPRINE) take 3 1/2 tablets daily( 175mg  per day)       Phone Note Call from Patient Call back at [facility] Phone [phone]   Call For: Dr Arlyce Dice Reason for Call: Refill Medication Summary of Call: Azathioprimne 20m supply takes 3 1/2 pills daily Total of 315tabs. Can we fax  refill sent to [facility]. Fax [phone] Needs to have on script: Date of Birth & member ID # [id number]. Can we let him know when it has been done? Initial call taken by: Leanor Kail [facility],  March 16, 2009 11:10 AM  Follow-up for Phone Call        Called pt to inform that [facility] Follow-up by: Merri Ray CMA Duncan Dull),  March 16, 2009 1:46 PM    New/Updated Medications: AZATHIOPRINE 50 MG  TABS (AZATHIOPRINE) take 3 1/2 tablets daily( 175mg  per day) Prescriptions: AZATHIOPRINE 50 MG  TABS (AZATHIOPRINE) take 3 1/2 tablets daily( 175mg  per day)  #315 x 4   Entered by:   Merri Ray CMA (AAMA)   Authorized by:   Louis Meckel MD   Signed by:   Merri Ray CMA (AAMA) on 03/16/2009   Method used:   Faxed to ...       [facility] Environmental education officer)       [address] [address]       [place], [place]  [address]       Ph: [phone]       Fax: [phone]   RxID:   [id number]

## 2010-02-16 NOTE — Assessment & Plan Note (Signed)
Summary: LEFT LEG PAIN-LB   Vital Signs:  Patient profile:   60 year old male Height:      68.5 inches (173.99 cm) Weight:      155.4 pounds (70.64 kg) O2 Sat:      97 % on Room air Temp:     98.2 degrees F (36.78 degrees C) oral Pulse rate:   57 / minute BP sitting:   110 / 72  (left arm) Cuff size:   regular  Vitals Entered By: Orlan Leavens RMA (September 23, 2009 10:39 AM)  O2 Flow:  Room air CC: (L) leg pain x's 2 weeks Is Patient Diabetic? No Pain Assessment Patient in pain? yes     Location: (L) leg Type: aching   Primary Care Provider:  Newt Lukes MD  CC:  (L) leg pain x's 2 weeks.  History of Present Illness:  c/o LLE pain onset 2 weeks ago - poss precipitated by tubing with g-kids 2 weeks prior to onset of pain - but does not recall injury or pop pain worse with prolonged walking and when standing on toes (as opposed to heels) assoc with swelling calf after prolonged (5 mile) walk- but no swelling now no bruising or bleeding at site of pain (medial lower calf) concerned for poss blood clot - no CP or SOB, no prior hx DVT    also review of other med issues:  CAD -  est with cardiologist for annual review [name]), prev saw torelli 2009 - no CP or anginal symptoms - 100% compliant with meds  UC - no flares >1 year - 100% compliant with meds as rx'd by GI - no adv se on current tx  osteoporosis - no bone pains - related to prior steroid use - not on fosamax for years and does not take Ca or vit d regularly  Current Medications (verified): 1)  Azathioprine 50 Mg  Tabs (Azathioprine) .... Take 3 1/2 Tablets Daily( 175mg  Per Day) 2)  Pentasa 500 Mg Cr-Caps (Mesalamine) .... 2 Tablets By Mouth Four Times A Day 3)  Lexapro 10 Mg Tabs (Escitalopram Oxalate) .Marland Kitchen.. 1 Tablet By Mouth Once Daily 4)  Aspirin 81 Mg Tbec (Aspirin) .Marland Kitchen.. 1 Tablet By Mouth Once Daily 5)  Niacin 500 Mg Tabs (Niacin) .... 2 Tablets By Mouth Once Daily 6)  Multivitamins  Tabs (Multiple  Vitamin) .Marland Kitchen.. 1 Tablet By Mouth Once Daily 7)  B Complex  Tabs (B Complex Vitamins) .Marland Kitchen.. 1 Tablet By Mouth Once Daily 8)  Plavix 75 Mg Tabs (Clopidogrel Bisulfate) .Marland Kitchen.. 1 Tablet By Mouth Once Daily 9)  Eql Coq10 300 Mg Caps (Coenzyme Q10) .... Once Daily 10)  Oscal 500/200 D-3 500-200 Mg-Unit Tabs (Calcium-Vitamin D) .Marland Kitchen.. 1 By Mouth Once Daily 11)  Simvastatin 10 Mg Tabs (Simvastatin) .... Take One Tablet By Mouth Daily At Bedtime 12)  Vitamin E 1000 Unit Caps (Vitamin E) .... Take 1 By Mouth Once Daily  Allergies (verified): 1)  ! Demerol  Past History:  Past Medical History: CAD   Cypher stent.Marland Kitchen LAD - 2004 (seen last cardiology 2006) /  nuclear scan... 2005... excellent tolerance... no significant scar or ischemia... EF 51% Ulcerative Colitis anxiety osteopenia - T score -2.1 Ulcerative colitis LV.. normal by history  MD roster: GI - kaplan cards - prev torelli, now [place] psyc - Kaur  optho - cohen derm - gruber  GERD Allergic rhinitis  Review of Systems  The patient denies fever, chest pain, dyspnea on exertion, headaches,  muscle weakness, and difficulty walking.    Physical Exam  General:  fit, thin, alert, well-developed, well-nourished, and cooperative to examination.    Lungs:  normal respiratory effort, no intercostal retractions or use of accessory muscles; normal breath sounds bilaterally - no crackles and no wheezes.    Heart:  normal rate, regular rhythm, no murmur, and no rub. BLE without edema. normal DP pulses and normal cap refill in all 4 extremities    Msk:  right calf tender along medial head of gastroc to palp - no divets - ligamtenous fx intact with nontender FROM active and passive Skin:  mild brusing anterior shin LLE but no brusining over site of pain (medial distal gastroc head)   Impression & Recommendations:  Problem # 1:  CALF PAIN, LEFT (ICD-729.5) suspect gastroc strain based on hx and exam -  improved with TENS unit (provided by  chiropractor) but only temp relief while TENS on - given prev swelling, r/o DVT for pt reassurance - doppler today if no DVT, tx 2 weeks meloxicam with ice/TENS - to call if unimproved for refer to sports med doc and or PT, call sooner if worse - erx done instucted to avoid explosive type activity - running, jumping Orders: Misc. Referral (Misc. Ref) Prescription Created Electronically [phone])  Complete Medication List: 1)  Azathioprine 50 Mg Tabs (Azathioprine) .... Take 3 1/2 tablets daily( 175mg  per day) 2)  Pentasa 500 Mg Cr-caps (Mesalamine) .... 2 tablets by mouth four times a day 3)  Lexapro 10 Mg Tabs (Escitalopram oxalate) .Marland Kitchen.. 1 tablet by mouth once daily 4)  Aspirin 81 Mg Tbec (Aspirin) .Marland Kitchen.. 1 tablet by mouth once daily 5)  Niacin 500 Mg Tabs (Niacin) .... 2 tablets by mouth once daily 6)  Multivitamins Tabs (Multiple vitamin) .Marland Kitchen.. 1 tablet by mouth once daily 7)  B Complex Tabs (B complex vitamins) .Marland Kitchen.. 1 tablet by mouth once daily 8)  Plavix 75 Mg Tabs (Clopidogrel bisulfate) .Marland Kitchen.. 1 tablet by mouth once daily 9)  Eql Coq10 300 Mg Caps (Coenzyme q10) .... Once daily 10)  Oscal 500/200 D-3 500-200 Mg-unit Tabs (Calcium-vitamin d) .Marland Kitchen.. 1 by mouth once daily 11)  Simvastatin 10 Mg Tabs (Simvastatin) .... Take one tablet by mouth daily at bedtime 12)  Vitamin E 1000 Unit Caps (Vitamin e) .... Take 1 by mouth once daily 13)  Meloxicam 15 Mg Tabs (Meloxicam) .Marland Kitchen.. 1 by mouth once daily x 7 days, then as needed for pain  Patient Instructions: 1)  it was good to see you today. 2)  we'll make referral for ultrasound of left calf today to look for clot. Our office will contact you regarding this appointment once made.  3)  If clot found, you will be contacted with further medication instructions - 4)  if no clot found, use meloxicam once daily x 7 days, then as needed for pain as discussed -your prescription has been electronically submitted to your pharmacy. Contact our office if you  believe you're having problems with the medication(s).  5)  cont TENS unit, ice and rest as ongoing until pain improved - 6)  if continued pain in 2 weeks, or if pain becoming wrose, let us know and we will refer to orthopedics for further evaluation - 7)  until pain improved, avoid explosive activity such as running and jumping - ok to walk so long as not increasing pain to do so Prescriptions: MELOXICAM 15 MG TABS (MELOXICAM) 1 by mouth once daily x 7 days, then  as needed for pain  #30 x 0   Entered and Authorized by:   Newt Lukes MD   Signed by:   Newt Lukes MD on 09/23/2009   Method used:   Electronically to        [id number] (retail)       [address]       [place], [place]  [address]       Ph: [phone] or [phone]       Fax: [phone]   RxID:   [id number]

## 2010-02-16 NOTE — Progress Notes (Signed)
Summary: Refill    Phone Note Call from Patient Call back at Home Phone [phone]   Call For: Dr Arlyce Dice Reason for Call: Refill Medication Summary of Call: [facility] did not receive the refill order we faxed. Wonders if we can call in to  [phone]. It's a vmail and we need to leave his name, member id, dob & address. Initial call taken by: Leanor Kail [facility],  March 23, 2009 10:55 AM  Follow-up for Phone Call        [facility], refaxed rx with pts ID #. Will call back to verify they recieved Follow-up by: Merri Ray CMA Duncan Dull),  March 23, 2009 12:05 PM  Additional Follow-up for Phone Call Additional follow up Details #1::        Called pt to inform that med was refaxed Additional Follow-up by: Merri Ray CMA Duncan Dull),  March 23, 2009 2:54 PM    Prescriptions: AZATHIOPRINE 50 MG  TABS (AZATHIOPRINE) take 3 1/2 tablets daily( 175mg  per day)  #315 x 4   Entered by:   Merri Ray CMA (AAMA)   Authorized by:   Louis Meckel MD   Signed by:   Merri Ray CMA (AAMA) on 03/23/2009   Method used:   Printed then faxed to ...       [facility] Environmental education officer)       [address] [address]       [place], [place]  [address]       Ph: [phone]       Fax: [phone]   RxID:   [id number]

## 2010-02-16 NOTE — Progress Notes (Signed)
----   Converted from flag ---- ---- 05/20/2009 12:44 PM, Edman Circle wrote: I scheduled this pt with Dr [name]. He saw him last in 2006. Dr Eden Emms will not see pt who have seen another Dr in the past. I sent a message to Dr Fabio Bering nurse but have not heard a reply.   Appt 5/6 @ 9:30 with [name]     ---- 05/15/2009 3:59 PM, Dagoberto Reef wrote: Thanks  ---- 05/15/2009 3:50 PM, Newt Lukes MD wrote: The following orders have been entered for this patient and placed on Admin Hold:  Type:     Referral       Code:   Cardiology Description:   Cardiology Referral Order Date:   05/15/2009   Authorized By:   Newt Lukes MD Order #:   [id number] Clinical Notes:   dr. Eden Emms at [facility] - eval and tx, hx stent to LAD 2004,  chronic dz mgmt/review - no active symptoms ------------------------------

## 2010-02-16 NOTE — Letter (Signed)
Summary: [facility]  [facility]   Imported By: Phillis Knack 10/12/2009 08:24:56  _____________________________________________________________________  External Attachment:    Type:   Image     Comment:   External Document

## 2010-02-16 NOTE — Progress Notes (Signed)
  Phone Note Other Incoming   Summary of Call: Copies of records received from Dr. Georgina Pillion at [facility] [phone]); sent up to Dr. Diamantina Monks attn by [employer].

## 2010-02-16 NOTE — Letter (Signed)
Summary: Colonoscopy Letter  [facility]  [name], [name] [address]   Phone: [phone]  Fax: [phone]      October 07, 2009 MRN: [id number]   [facility] [place], [place]  [address]   Dear Joseph Booker,   According to your medical record, it is time for you to schedule a Colonoscopy. The American Cancer Society recommends this procedure as a method to detect early colon cancer. Patients with a family history of colon cancer, or a personal history of colon polyps or inflammatory bowel disease are at increased risk.  This letter has beeen generated based on the recommendations made at the time of your procedure. If you feel that in your particular situation this may no longer apply, please contact our office.  Please call our office at [phone] to schedule this appointment or to update your records at your earliest convenience.  Thank you for cooperating with [place] to provide you with the very best care possible.   Sincerely,  Sandy Salaam. Deatra Ina, M.D.  [facility] [phone]

## 2010-02-16 NOTE — Miscellaneous (Signed)
  Clinical Lists Changes  Observations: Added new observation of PAST MED HX: CAD   Cypher stent.Marland Kitchen LAD - 2004 (seen last cardiology 2006) /  nuclear scan... 2005... excellent tolerance... no significant scar or ischemia... EF 51% Ulcerative Colitis anxiety osteopenia - T score -2.1 Ulcerative colitis LV.. normal by history  MD rooster: GI - kaplan cards - torelli, (nishan) psyc - [facility]  optho - cohen derm - gruber  GERD Allergic rhinitis  (06/11/2009 12:36) Added new observation of PRIMARY MD: Newt Lukes MD (06/11/2009 12:36)       Past History:  Past Medical History: CAD   Cypher stent.Marland Kitchen LAD - 2004 (seen last cardiology 2006) /  nuclear scan... 2005... excellent tolerance... no significant scar or ischemia... EF 51% Ulcerative Colitis anxiety osteopenia - T score -2.1 Ulcerative colitis LV.. normal by history  MD rooster: GI - kaplan cards - torelli, (nishan) psyc - [facility]  optho - cohen derm - gruber  GERD Allergic rhinitis

## 2010-02-16 NOTE — Assessment & Plan Note (Signed)
Summary: NEW/AETNA/#/CD   Vital Signs:  Patient profile:   60 year old male Height:      68.5 inches (173.99 cm) Weight:      153.0 pounds (69.55 kg) O2 Sat:      98 % on Room air Temp:     98.4 degrees F (36.89 degrees C) oral Pulse rate:   74 / minute BP sitting:   130 / 76  (left arm) Cuff size:   regular  Vitals Entered By: Tomma Lightning (May 15, 2009 2:57 PM)  O2 Flow:  Room air CC: New patient Is Patient Diabetic? No Pain Assessment Patient in pain? no        Primary Care Provider:  Rowe Clack MD  CC:  New patient.  History of Present Illness: new pt to me and our division (seen remotely by dr. hopper in 12/2003) - here to est care prior PCP dr. Burnett Harry at [facility] has recently retired  also patient is here today for annual physical.  Patient feels well and has no complaints.   Not fasting but will return next week for labs.  also review of other med issues:  CAD -needs new cardiologist for annual review, last saw torelli >35mo ago - no CP or anginal symptoms - 100% compliant with meds  UC - no flares >1 year - 100% compliant with meds as rx'd by GI - no adv se on current tx  osteoporosis - no bone pains - related to prior steroid use - not on fosamax for years and does not take Ca or vit d regularly  Preventive Screening-Counseling & Management  Alcohol-Tobacco     Alcohol drinks/day: <1     Alcohol type: wine     Alcohol Counseling: not indicated; use of alcohol is not excessive or problematic     Smoking Status: never     Tobacco Counseling: not indicated; no tobacco use  Caffeine-Diet-Exercise     Caffeine Counseling: not indicated; caffeine use is not excessive or problematic     Does Patient Exercise: yes     Exercise Counseling: not indicated; exercise is adequate     Depression Counseling: not indicated; screening negative for depression  Safety-Violence-Falls     Seat Belt Counseling: not indicated; patient wears seat belts     Helmet  Counseling: not applicable     Firearm Counseling: not applicable     Smoke Detector Counseling: no     Violence Counseling: not indicated; no violence risk noted     Fall Risk Counseling: not indicated; no significant falls noted  Clinical Review Panels:  Prevention   Last Colonoscopy:  Location:  [facility]  (10/10/2008)  Immunizations   Last Tetanus Booster:  Historical (01/18/2007)   Last Flu Vaccine:  Historical (10/17/2008)   Last Pneumovax:  Historical (01/18/1992)  CBC   WBC:  5.0 (07/01/2008)   RBC:  3.52 (07/01/2008)   Hgb:  13.9 (07/01/2008)   Hct:  39.4 (07/01/2008)   Platelets:  161.0 (07/01/2008)   MCV  111.7 H fl (07/01/2008)   MCHC  35.3 (07/01/2008)   RDW  15.1 (07/01/2008)   PMN:  71.7 (07/01/2008)   Lymphs:  19.1 (07/01/2008)   Monos:  5.7 (07/01/2008)   Eosinophils:  2.1 (07/01/2008)   Basophil:  1.4 (07/01/2008)  Complete Metabolic Panel   Albumin:  3.9 (07/01/2008)   Total Protein:  7.0 (07/01/2008)   Total Bili:  1.8 (07/01/2008)   Alk Phos:  38 (07/01/2008)   SGPT (  ALT):  36 (07/01/2008)   SGOT (AST):  34 (07/01/2008)   Current Medications (verified): 1)  Azathioprine 50 Mg  Tabs (Azathioprine) .... Take 3 1/2 Tablets Daily( 180m Per Day) 2)  Pentasa 500 Mg Cr-Caps (Mesalamine) .... 2 Tablets By Mouth Four Times A Day 3)  Lexapro 10 Mg Tabs (Escitalopram Oxalate) ..Marland Kitchen. 1 Tablet By Mouth Once Daily 4)  Aspirin 81 Mg Tbec (Aspirin) ..Marland Kitchen. 1 Tablet By Mouth Once Daily 5)  Red Yeast Rice Extract 600 Mg Caps (Red Yeast Rice Extract) .... Take 4 By Mouth Once Daily 6)  Niacin 500 Mg Tabs (Niacin) .... 2 Tablets By Mouth Once Daily 7)  Multivitamins  Tabs (Multiple Vitamin) ..Marland Kitchen. 1 Tablet By Mouth Once Daily 8)  B Complex  Tabs (B Complex Vitamins) ..Marland Kitchen. 1 Tablet By Mouth Once Daily 9)  Plavix 75 Mg Tabs (Clopidogrel Bisulfate) ..Marland Kitchen. 1 Tablet By Mouth Once Daily 10)  Coq10 30 Mg Caps (Coenzyme Q10) ..Marland Kitchen. 1 Capsule By Mouth Once  Daily  Allergies (verified): 1)  ! Demerol  Past History:  Past Medical History: CAD s/p stent to LAD - 2004 Ulcerative Colitis anxiety osteopenia - T score -2.1  MD rooster: GI - kaplan cards - torelli, (nishan) psyc - [facility] - cohen derm -Tonia Brooms Past Surgical History: Reviewed history from 07/29/2008 and no changes required. 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 Alcohol Use - yes  Occasional wine >>8/EXHBIllicit Drug Use - no Daily Caffeine Use  2 per day  Does Patient Exercise:  yes  Review of Systems       see HPI above. I have reviewed all other systems and they were negative.   Physical Exam  General:  fit, thin, alert, well-developed, well-nourished, and cooperative to examination.    Eyes:  vision grossly intact; pupils equal, round and reactive to light.  conjunctiva and lids normal.   wears glasses Ears:  normal pinnae bilaterally, without erythema, swelling, or tenderness to palpation. TMs clear, without effusion, or cerumen impaction. Hearing grossly normal bilaterally  Mouth:  teeth and gums in good repair; mucous membranes moist, without lesions or ulcers. oropharynx clear without exudate, no erythema.  Neck:  supple, full ROM, no masses, no thyromegaly; no thyroid nodules or tenderness. no JVD or carotid bruits.   Lungs:  normal respiratory effort, no intercostal retractions or use of accessory muscles; normal breath sounds bilaterally - no crackles and no wheezes.    Heart:  normal rate, regular rhythm, no murmur, and no rub. BLE without edema. normal DP pulses and normal cap refill in all 4 extremities    Abdomen:  soft, non-tender, normal bowel sounds, no distention; no masses and no  appreciable hepatomegaly or splenomegaly.   Rectal:  defer to GI Prostate:  defer to GI Msk:  No deformity or scoliosis noted of thoracic or lumbar spine.   Neurologic:  alert & oriented X3 and cranial nerves II-XII symetrically intact.  strength normal in all extremities, sensation intact to light touch, and gait normal. speech fluent without dysarthria or aphasia; follows commands with good comprehension.  Skin:  no rashes, vesicles, ulcers, or erythema. No nodules or irregularity to palpation.  Psych:  Oriented X3, memory intact for recent and remote, normally  interactive, good eye contact, not anxious appearing, not depressed appearing, and not agitated.      Impression & Recommendations:  Problem # 1:  PREVENTIVE HEALTH CARE (ICD-V70.0)  Patient has been counseled on age-appropriate routine health concerns for screening and prevention. These are reviewed and up-to-date. Immunizations are up-to-date or declined. Labs ordered, and ECG reviewed.   Orders: EKG w/ Interpretation (93000)  Problem # 2:  OSTEOPENIA (ICD-733.90) related to prior steroid use will send for records - reports last bone scan 8mo ago stable at -2.1, prev -2.2 took fosamax for years but none > 3 years reminded of need to take Ca+D  Problem # 3:  CAD (ICD-414.00)  will send for records re: last stress test - no active symptoms -EKG today for CPX normal cont current meds refer to cards to est with new cards provider - pt requests dr. nJohnsie CancelHis updated medication list for this problem includes:    Aspirin 81 Mg Tbec (Aspirin) ..Marland Kitchen.. 1 tablet by mouth once daily    Plavix 75 Mg Tabs (Clopidogrel bisulfate) ..Marland Kitchen.. 1 tablet by mouth once daily  Orders: Cardiology Referral (Cardiology)  Problem # 4:  ULCERATIVE COLITIS-UNIVERSAL (ICD-556.9) stable- mgmt per dr. kDeatra Ina Problem # 5:  ANXIETY STATE, UNSPECIFIED (ICD-300.00) stable - cont meds and to follow with psyc as needed  His updated medication list for  this problem includes:    Lexapro 10 Mg Tabs (Escitalopram oxalate) ..Marland Kitchen.. 1 tablet by mouth once daily  Complete Medication List: 1)  Azathioprine 50 Mg Tabs (Azathioprine) .... Take 3 1/2 tablets daily( 1743mper day) 2)  Pentasa 500 Mg Cr-caps (Mesalamine) .... 2 tablets by mouth four times a day 3)  Lexapro 10 Mg Tabs (Escitalopram oxalate) ...Marland Kitchen 1 tablet by mouth once daily 4)  Aspirin 81 Mg Tbec (Aspirin) ...Marland Kitchen 1 tablet by mouth once daily 5)  Red Yeast Rice Extract 600 Mg Caps (Red yeast rice extract) .... Take 4 by mouth once daily 6)  Niacin 500 Mg Tabs (Niacin) .... 2 tablets by mouth once daily 7)  Multivitamins Tabs (Multiple vitamin) ...Marland Kitchen 1 tablet by mouth once daily 8)  B Complex Tabs (B complex vitamins) ...Marland Kitchen 1 tablet by mouth once daily 9)  Plavix 75 Mg Tabs (Clopidogrel bisulfate) ...Marland Kitchen 1 tablet by mouth once daily 10)  Coq10 30 Mg Caps (Coenzyme q10) ...Marland Kitchen 1 capsule by mouth once daily 11)  Oscal 500/200 D-3 500-200 Mg-unit Tabs (Calcium-vitamin d) ...Marland Kitchen 1 by mouth two times a day  Patient Instructions: 1)  it was good to see you today.  2)  history and medications reviewed today -  3)  recommend adding calcium + vit D (such as Oscal or Citracal) two times a day 4)  will send for records from dr. maCarolyne Fiscalffice 5)  we'll make referral to Dr. NiJohnsie Cancelith cardiology. Our office will contact you regarding this appointment once made.  6)  return next week in AM for labs as discussed - test(s) ordered today - your results will be posted on the phone tree for review in 48-72 hours from the time of test completion; call [phone] enter your 9 digit MRN (listed above on this page, just below your name); if any changes need to be made or there are abnormal results, you will be contacted directly.  7)  Please schedule a follow-up appointment annually, sooner if problems.    Colonoscopy  Procedure date:  05/02/2006  Findings:      Location:  [facility].    Results: Normal.   Colonoscopy  Procedure date:  03/29/2005  Findings:      Location:  [facility].  Results: Colitis.            Immunization History:  Tetanus/Td Immunization History:    Tetanus/Td:  historical (01/18/2007)  Influenza Immunization History:    Influenza:  historical (10/17/2008)  Pneumovax Immunization History:    Pneumovax:  historical (01/18/1992)

## 2010-02-16 NOTE — Letter (Signed)
Summary: [facility] and Labs/[facility]   Imported By: Lester Bulloch 06/09/2009 08:43:23  _____________________________________________________________________  External Attachment:    Type:   Image     Comment:   External Document

## 2010-02-16 NOTE — Miscellaneous (Signed)
Summary: Orders Update  Clinical Lists Changes  Orders: Added new Test order of Venous Duplex Lower Extremity (Venous Duplex Lower) - Signed 

## 2010-02-16 NOTE — Progress Notes (Signed)
Summary: Rx refill req  Phone Note Refill Request   Refills Requested: Medication #1:  PLAVIX 75 MG TABS 1 tablet by mouth once daily   Dosage confirmed as above?Dosage Confirmed Pt is requesting 90 day supply to [facility]  Initial call taken by: Margaret Pyle, CMA,  Jun 08, 2009 1:18 PM    Prescriptions: PLAVIX 75 MG TABS (CLOPIDOGREL BISULFATE) 1 tablet by mouth once daily  #90 x 3   Entered by:   Margaret Pyle, CMA   Authorized by:   Newt Lukes MD   Signed by:   Margaret Pyle, CMA on 06/08/2009   Method used:   Faxed to ...       [facility] Environmental education officer)       [address] [address]       [place], [place]  [address]       Ph: [phone]       Fax: [phone]   RxID:   [id number]

## 2010-02-18 NOTE — Letter (Signed)
Summary: Patient Notice- Colon Biospy Results  [facility]  [address] [place], [place] [address]   Phone: [phone]  Fax: [phone]        February 10, 2010 MRN: [id number]    [facility] [address] [place], [place]  [address]    Dear Mr. WILCZYNSKI,  I am pleased to inform you that the biopsies taken during your recent colonoscopy did not show any evidence of cancer or precancerous changes upon pathologic examination.  Additional information/recommendations:  __No further action is needed at this time.  Please follow-up with      your primary care physician for your other healthcare needs.  __Please call [phone] to schedule a return visit to review      your condition.  __Continue with the treatment plan as outlined on the day of your      exam.  _x_You should have a repeat colonoscopy examination in 1_ years.  Please call us if you are having persistent problems or have questions about your condition that have not been fully answered at this time.  Sincerely,  Louis Meckel MD   This letter has been electronically signed by your physician.  Appended Document: Patient Notice- Colon Biospy Results LETTER MAILED

## 2010-02-18 NOTE — Assessment & Plan Note (Signed)
Summary: f/u--ch.    History of Present Illness Visit Type: Follow-up Visit Primary GI MD: Erskine Emery MD [facility] Primary Provider: Rowe Clack MD Requesting Provider: na Chief Complaint: F/u for ulcerative colitis. Pt states he is having severe fatigue but denies any GI complaints  History of Present Illness:   Joseph Booker has returned for followup of his ulcerative colitis.  He remains in clinical remission on a regimen of Imuran and Pentasa.  He occasionally has loose stools.  He denies rectal bleeding.  His main complaint is fatigue.  This is an intermittent problem that has been more noticeable over the last 3-4 months.  He has had pancolitis for greater than 15 years.  He is due for surveillance colonoscopy.  The patient is on Plavix for coronary artery disease.  He has a stent that was placed in 2004.   GI Review of Systems      Denies abdominal pain, acid reflux, belching, bloating, chest pain, dysphagia with liquids, dysphagia with solids, heartburn, loss of appetite, nausea, vomiting, vomiting blood, weight loss, and  weight gain.        Denies anal fissure, black tarry stools, change in bowel habit, constipation, diarrhea, diverticulosis, fecal incontinence, heme positive stool, hemorrhoids, irritable bowel syndrome, jaundice, light color stool, liver problems, rectal bleeding, and  rectal pain.    Current Medications (verified): 1)  Azathioprine 50 Mg  Tabs (Azathioprine) .... Take 3 1/2 Tablets Daily( 131m Per Day) 2)  Pentasa 500 Mg Cr-Caps (Mesalamine) .... 2 Tablets By Mouth Four Times A Day 3)  Lexapro 10 Mg Tabs (Escitalopram Oxalate) ..Marland Kitchen. 1 Tablet By Mouth Once Daily 4)  Aspirin 81 Mg Tbec (Aspirin) ..Marland Kitchen. 1 Tablet By Mouth Once Daily 5)  Niacin 500 Mg Tabs (Niacin) .... 2 Tablets By Mouth Once Daily 6)  Multivitamins  Tabs (Multiple Vitamin) ..Marland Kitchen. 1 Tablet By Mouth Once Daily 7)  B Complex  Tabs (B Complex Vitamins) ..Marland Kitchen. 1 Tablet By Mouth Once Daily 8)  Plavix 75  Mg Tabs (Clopidogrel Bisulfate) ..Marland Kitchen. 1 Tablet By Mouth Once Daily 9)  Eql Coq10 300 Mg Caps (Coenzyme Q10) .... Once Daily 10)  Oscal 500/200 D-3 500-200 Mg-Unit Tabs (Calcium-Vitamin D) ..Marland Kitchen. 1 By Mouth Once Daily 11)  Simvastatin 10 Mg Tabs (Simvastatin) .... Take One Tablet By Mouth Daily At Bedtime  Allergies (verified): 1)  ! Demerol  Past History:  Past Medical History: CAD   Cypher stent..Marland KitchenLAD - 2004 (seen last cardiology 2006) /  nuclear scan... 2005... excellent tolerance... no significant scar or ischemia... EF 51% anxiety osteopenia - T score -2.1 Ulcerative colitis LV.. normal by history   GERD Allergic rhinitis Esophagitis  Hyperlipidemia   MD roster: GI - kaplan cards - prev torelli, now k[place]iapsyc - [facility]  optho - cohen derm -Tonia Brooms Past Surgical History: Reviewed history from 06/04/2009 and no changes required. angioplasty/stent 2004 abdominal hernia 2006  Family History: Reviewed history from 05/15/2009 and no changes required. No FH of Colon Cancer Family History of Diabetes: mother,father  Family History of Heart Disease: mother and father died at age 5859MI  Social History: Reviewed history from 05/15/2009 and no changes required. 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 Alcohol Use - yes  Occasional wine >>5/YIFOIllicit Drug Use - no Daily Caffeine Use  2 per day   Review of Systems       The patient complains of fatigue.  The  patient denies allergy/sinus, anemia, anxiety-new, arthritis/joint pain, back pain, blood in urine, breast changes/lumps, change in vision, confusion, cough, coughing up blood, depression-new, fainting, fever, headaches-new, hearing problems, heart murmur, heart rhythm changes, itching, menstrual pain, muscle pains/cramps, night sweats, nosebleeds, pregnancy symptoms, shortness of breath, skin rash, sleeping problems, sore throat, swelling of feet/legs,  swollen lymph glands, thirst - excessive , urination - excessive , urination changes/pain, urine leakage, vision changes, and voice change.         All other systems were reviewed and were negative   Vital Signs:  Patient profile:   60 year old male Height:      68.5 inches Weight:      159 pounds BMI:     23.91 BSA:     1.87 Pulse rate:   64 / minute Pulse rhythm:   regular BP sitting:   124 / 72  (left arm) Cuff size:   regular  Vitals Entered By: Hope Pigeon CMA (January 19, 2010 9:01 AM)  Physical Exam  Additional Exam:  On physical exam he is a well-developed well-nourished male  skin: anicteric HEENT: normocephalic; PEERLA; no nasal or pharyngeal abnormalities neck: supple nodes: no cervical lymphadenopathy chest: clear to ausculatation and percussion heart: no murmurs, gallops, or rubs abd: soft, nontender; BS normoactive; no abdominal masses, tenderness, organomegaly rectal: deferred ext: no cynanosis, clubbing, edema skeletal: no deformities neuro: oriented x 3; no focal abnormalities    Impression & Recommendations:  Problem # 1:  ULCERATIVE COLITIS-UNIVERSAL (ICD-556.9) Patient remains in clinical remission on Imuran and Pentasa.  Medications #1 surveillance colonoscopy  Orders: Colonoscopy (Colon) TLB-CBC Platelet - w/Differential (85025-CBCD) TLB-BMP (Basic Metabolic Panel-BMET) (06301-SWFUXNA)  Problem # 2:  FATIGUE (ICD-780.79) This is a nonspecific symptom.  Labs will be checked including CBC and comprehensive metabolic profile.  Patient Instructions: 1)  Copy sent to : Rowe Clack MD 2)  You will go to the basement for labs today 3)  You have signed a waiver to take [place] for your prep for your procedure 4)  Your colonoscopy is scheduled on 02/04/2010 at 4pm 5)  You will hold your Plavix 7 days prior to your procedure Per Dr Deatra Ina 6)  Colonoscopy and Flexible Sigmoidoscopy brochure given.  7)  Conscious Sedation brochure given.    8)  The medication list was reviewed and reconciled.  All changed / newly prescribed medications were explained.  A complete medication list was provided to the patient / caregiver. Prescriptions: OSMOPREP 1.102-0.398 GM  TABS (SOD PHOS MONO-SOD PHOS DIBASIC) As per prep instructions.  #32 x 0   Entered by:   Genella Mech CMA ([place])   Authorized by:   Inda Castle MD   Signed by:   Genella Mech CMA ([place]) on 01/19/2010   Method used:   Electronically to        [id number] (retail)       [address]       [place], [place]  [address]       Ph: [phone] or [phone]       Fax: [phone]   RxID:   [id number]

## 2010-02-18 NOTE — Progress Notes (Signed)
Summary: Rx Info   Phone Note Call from Patient Call back at Home Phone [phone]   Caller: Patient Call For: Dr Deatra Ina Reason for Call: Talk to Nurse Details for Reason: New RX coverage Summary of Call: Pt wanted to call and let you know that [facility] is now his RX coverage Initial call taken by: Cora Daniels,  January 19, 2010 2:22 PM  Follow-up for Phone Call        Noted, will change in system  Additional Follow-up for Phone Call Additional follow up Details #1::        Changed to [facility] Additional Follow-up by: Genella Mech CMA Deborra Medina),  January 20, 2010 10:16 AM

## 2010-02-18 NOTE — Letter (Signed)
Summary: Anticoagulation/[facility]  Anticoagulation/[facility]   Imported By: Sherian Rein 01/29/2010 12:33:40  _____________________________________________________________________  External Attachment:    Type:   Image     Comment:   External Document

## 2010-02-18 NOTE — Miscellaneous (Signed)
Summary: OSP consent form/[facility]  OSP consent form/[facility]   Imported By: Sherian Rein 01/22/2010 09:42:28  _____________________________________________________________________  External Attachment:    Type:   Image     Comment:   External Document

## 2010-02-18 NOTE — Letter (Signed)
Summary: Anticoagulation Modification Letter (OK TO HOLD)  [facility]  [place], [place] [address]   Phone: [phone]  Fax: [phone]    January 19, 2010  Re:    Joseph Booker DOB:    [birth year] MRN:    [id number]    Dear Dr Ron Parker,  We have scheduled the above patient for an endoscopic procedure. Our records show that  he/she is on anticoagulation therapy. Please advise as to how long the patient may come off their therapy of Plavix  prior to the scheduled procedure(s) on PLAVIX   Please fax back/or route the completed form to [place]  at [phone] Thank you for your help with this matter.  Sincerely,  Genella Mech CMA Deborra Medina)   Physician Recommendation:  Hold Plavix 7 days prior ________________   Appended Document: Anticoagulation Modification Letter (OK TO HOLD) Recieved letter back from Dr Ron Parker. Will send to be scanned in

## 2010-02-18 NOTE — Procedures (Addendum)
Summary: Colonoscopy  Patient: [name] Note: All result statuses are Final unless otherwise noted.  Tests: (1) Colonoscopy (COL)   COL Colonoscopy           DONE     [facility]     [address]     [place], [place]  [address]           COLONOSCOPY PROCEDURE REPORT           PATIENT:  Joseph Booker, Joseph Booker  MR#:  [id number]     BIRTHDATE:  Mar 04, 1950, 59 yrs. old  GENDER:  male           ENDOSCOPIST:  Barbette Hair. Arlyce Dice, MD     Referred by:           PROCEDURE DATE:  02/04/2010     PROCEDURE:  Colonoscopy with biopsy     ASA CLASS:  Class II     INDICATIONS:  1) evaluation of ulcerative colitis  2) surveillance                 MEDICATIONS:   Fentanyl 100 mcg IV, Versed 10 mg IV, Benadryl 50     mg IV           DESCRIPTION OF PROCEDURE:   After the risks benefits and     alternatives of the procedure were thoroughly explained, informed     consent was obtained.  Digital rectal exam was performed and     revealed no abnormalities.   The [facility] CF-[id number] endoscope     was introduced through the anus and advanced to the cecum, which     was identified by both the appendix and ileocecal valve, without     limitations.  The quality of the prep was excellent, using     MoviPrep.  The instrument was then slowly withdrawn as the colon     was fully examined.     <<PROCEDUREIMAGES>>           FINDINGS:  A normal appearing cecum, ileocecal valve, and     appendiceal orifice were identified. The ascending, hepatic     flexure, transverse, splenic flexure, descending, sigmoid colon,     and rectum appeared unremarkable (see image2, image3, image4,     image5, image6, and image7). Random biopsies were taken every 10cm     Retroflexed views in the rectum revealed no abnormalities.    The     time to cecum =  6.0  minutes. The scope was then withdrawn (time     =  7.75  min) from the patient and the procedure completed.     COMPLICATIONS:  None           ENDOSCOPIC  IMPRESSION:     1) Normal colon     RECOMMENDATIONS:     1) Colonoscopy     2) Sedation with MAC for future procedures           REPEAT EXAM:   1 year(s) Colonoscopy           ______________________________     Barbette Hair. Arlyce Dice, MD           CC:  Newt Lukes, MD           n.     Rosalie DoctorBarbette Hair. Lachele Lievanos at 02/04/2010 04:48 PM           Mallie Snooks, [id number]  Note: An exclamation mark (!) indicates a  result that was not dispersed into the flowsheet. Document Creation Date: 02/04/2010 4:48 PM _______________________________________________________________________  (1) Order result status: Final Collection or observation date-time: 02/04/2010 16:44 Requested date-time:  Receipt date-time:  Reported date-time:  Referring Physician:   Ordering Physician: Melvia Heaps [phone]) Specimen Source:  Source: Launa Grill Order Number: [id number] Lab site:   Appended Document: Colonoscopy     Procedures Next Due Date:    Colonoscopy: 01/2011

## 2010-02-18 NOTE — Letter (Signed)
Summary: Letter from [facility]  Letter from [facility]   Imported By: Marylou Mccoy 01/27/2010 17:24:06  _____________________________________________________________________  External Attachment:    Type:   Image     Comment:   External Document

## 2010-02-22 ENCOUNTER — Telehealth (INDEPENDENT_AMBULATORY_CARE_PROVIDER_SITE_OTHER): Payer: Self-pay

## 2010-02-23 ENCOUNTER — Encounter: Payer: Self-pay | Admitting: Cardiology

## 2010-02-23 ENCOUNTER — Ambulatory Visit (HOSPITAL_COMMUNITY): Payer: Managed Care, Other (non HMO) | Attending: Cardiology

## 2010-02-23 DIAGNOSIS — I4949 Other premature depolarization: Secondary | ICD-10-CM

## 2010-02-23 DIAGNOSIS — I2510 Atherosclerotic heart disease of native coronary artery without angina pectoris: Secondary | ICD-10-CM | POA: Insufficient documentation

## 2010-02-23 DIAGNOSIS — R0789 Other chest pain: Secondary | ICD-10-CM

## 2010-02-23 DIAGNOSIS — R0609 Other forms of dyspnea: Secondary | ICD-10-CM

## 2010-02-23 DIAGNOSIS — R0989 Other specified symptoms and signs involving the circulatory and respiratory systems: Secondary | ICD-10-CM

## 2010-02-24 NOTE — Assessment & Plan Note (Signed)
Summary: rov. gd      Allergies Added:   Visit Type:  Follow-up Primary Provider:  Newt Lukes MD  CC:  CAD.  History of Present Illness: The patient is seen for cardiology followup.  He is actually doing well.  He has not had any significant problems.  He is almost 3 years out from his last coronary intervention.  He is taking a very low dose of statin.  This is not causing any increased fatigue.  His LDL is 80, HDL 53, triglycerides 28.  He is going about fall activities.  Current Medications (verified): 1)  Azathioprine 50 Mg  Tabs (Azathioprine) .... Take 3 1/2 Tablets Daily( 175mg  Per Day) 2)  Pentasa 500 Mg Cr-Caps (Mesalamine) .... 2 Tablets By Mouth Four Times A Day 3)  Lexapro 10 Mg Tabs (Escitalopram Oxalate) .Marland Kitchen.. 1 Tablet By Mouth Once Daily 4)  Aspirin 81 Mg Tbec (Aspirin) .Marland Kitchen.. 1 Tablet By Mouth Once Daily 5)  Niacin 500 Mg Tabs (Niacin) .... 2 Tablets By Mouth Once Daily 6)  Multivitamins  Tabs (Multiple Vitamin) .Marland Kitchen.. 1 Tablet By Mouth Once Daily 7)  B Complex  Tabs (B Complex Vitamins) .Marland Kitchen.. 1 Tablet By Mouth Once Daily 8)  Plavix 75 Mg Tabs (Clopidogrel Bisulfate) .Marland Kitchen.. 1 Tablet By Mouth Once Daily 9)  Eql Coq10 300 Mg Caps (Coenzyme Q10) .... Once Daily 10)  Oscal 500/200 D-3 500-200 Mg-Unit Tabs (Calcium-Vitamin D) .Marland Kitchen.. 1 By Mouth Once Daily 11)  Simvastatin 10 Mg Tabs (Simvastatin) .... Take One Tablet By Mouth Daily At Bedtime  Allergies (verified): 1)  ! Demerol  Past History:  Past Medical History: CAD   Cypher stent.Marland Kitchen LAD - 2004 (seen last cardiology 2006) /  nuclear scan... 2005... excellent tolerance... no significant scar or ischemia... EF 51% anxiety osteopenia - T score -2.1 Ulcerative colitis LV.. normal by history   GERD Allergic rhinitis Esophagitis  Hyperlipidemia ..  MD roster: GI - kaplan cards - prev torelli, now [place] psyc - Kaur  optho - cohen derm - gruber  Review of Systems       The patient denies fever, chills, headache,  sweats, rash, change in vision, change in hearing, chest pain, cough, nausea vomiting, urinary symptoms.  All of the systems are reviewed and are negative  Vital Signs:  Patient profile:   60 year old male Height:      68.5 inches Weight:      156 pounds BMI:     23.46 Pulse rate:   70 / minute BP sitting:   122 / 74  (left arm) Cuff size:   regular  Vitals Entered By: Hardin Negus, RMA (February 11, 2010 3:17 PM)  Physical Exam  General:  The patient looks good today. Head:  head is atraumatic. Eyes:  no xanthelasma. Neck:  no jugular venous distention. Chest Wall:  no chest wall tenderness. Lungs:  lungs are clear cardiorespiratory effort is nonlabored. Heart:  cardiac exam reveals S1-S2.  No clicks or significant murmurs. Abdomen:  abdomen is soft. Msk:  no musculoskeletal deformities. Extremities:  no peripheral edema. Skin:  no skin rashes. Psych:  patient is oriented to person time and place.  Affect is normal.   Impression & Recommendations:  Problem # 1:  DYSLIPIDEMIA (ICD-272.4) The patient is tolerating very low dose statin.  He is doing well with this.  No change in therapy.  Problem # 2:  FATIGUE (ICD-780.79) His fatigue level appears to be stable.  No change in therapy.  Problem # 3:  CAD (ICD-414.00)  His updated medication list for this problem includes:    Aspirin 81 Mg Tbec (Aspirin) .Marland Kitchen... 1 tablet by mouth once daily    Plavix 75 Mg Tabs (Clopidogrel bisulfate) .Marland Kitchen... 1 tablet by mouth once daily The patient's coronary status seems to be stable.  His last nuclear study was 2005.  It is appropriate now to proceed with followup stress nuclear scan.  We will be very careful to follow all  FAA specifications for this study.  This will help [place] reassess his heart and also have all information needed for his place of work.  Orders: Nuclear Stress Test (Nuc Stress Test)  Problem # 4:  ACUTE SINUSITIS, UNSPECIFIED (ICD-461.9)  Patient Instructions: 1)  Your  physician recommends that you schedule a follow-up appointment after stress test 2)  Your physician recommends that you continue on your current medications as directed. Please refer to the Current Medication list given to you today. 3)  Your physician has requested that you have an exercise stress myoview.  For further information please visit [email or web address]  Please follow instruction sheet, as given.

## 2010-03-04 NOTE — Progress Notes (Signed)
Summary: Nuc. Pre-Procedure  Phone Note Outgoing Call Call back at [facility] Phone [phone]   Call placed by: Irean Hong, RN,  February 22, 2010 3:03 PM Summary of Call: Reviewed information on Myoview Information Sheet (see scanned document for further details).  Spoke with patient per Allen Kell CNMT/Brianna Bennett,RN.     Nuclear Med Background Indications for Stress Test: Evaluation for Ischemia, Stent Patency  Indications Comments: FAA Physical  History: Echo, GXT, Heart Catheterization, Myocardial Perfusion Study, Stents  History Comments: GXT, Echo in past by Dr. Shelva Majestic. '04 Cath>Stent LAD. '05 MPS: (-) ischemia, EF=51%.  Symptoms: Fatigue    Nuclear Pre-Procedure Cardiac Risk Factors: Family History - CAD, Lipids Height (in): 68.5

## 2010-03-04 NOTE — Assessment & Plan Note (Signed)
Summary: Cardiology Nuclear Testing  Nuclear Med Background Indications for Stress Test: Evaluation for Ischemia, Stent Patency  Indications Comments: FAA Physical  History: Echo, GXT, Heart Catheterization, Myocardial Perfusion Study, Stents  History Comments: GXT, Echo in past by Dr. Rayford Halsted. '04 Cath>Stent LAD. '05 MPS: (-) ischemia, EF=51%.  Symptoms: Chest Pressure, Chest Pressure with Exertion, Fatigue, Palpitations, Rapid HR  Symptoms Comments: Last CP 5 days ago,10 second duration.   Nuclear Pre-Procedure Cardiac Risk Factors: Family History - CAD, Lipids Caffeine/Decaff Intake: none NPO After: 8:00 PM Lungs: clear IV 0.9% NS with Angio Cath: 20g     IV Site: R Forearm IV Started by: Matilde Haymaker, RN Chest Size (in) 40     Height (in): 68.5 Weight (lb): 156 BMI: 23.46  Nuclear Med Study 1 or 2 day study:  1 day     Stress Test Type:  Stress Reading MD:  Kirk Ruths, MD     Referring MD:  Veatrice Bourbon Resting Radionuclide:  Technetium 67mTetrofosmin     Resting Radionuclide Dose:  10.8 mCi  Stress Radionuclide:  Technetium 928metrofosmin     Stress Radionuclide Dose:  33 mCi   Stress Protocol Exercise Time (min):  13:01 min     Max HR:  150 bpm     Predicted Max HR:  16433pm  Max Systolic BP: 18295m Hg     Percent Max HR:  92.02 %     METS: 15.3 Rate Pressure Product:  27300    Stress Test Technologist:  PaIrven Baltimore RN     Nuclear Technologist:  ToAnnye RuskCNMT  Rest Procedure  Myocardial perfusion imaging was performed at rest 45 minutes following the intravenous administration of Technetium 9921mtrofosmin.  Stress Procedure  The patient exercised for 13 minutes and 01 second, RPE=16.  The patient stopped due to DOE   and denied any chest pain.  There were no significant ST-T wave changes, occ. PVC's. The systolic BP dropped to 149188/41mediately post exercise with dizziness and mild nausea, RPE=16.  Technetium 37m40mrofosmin was injected at peak  exercise and myocardial perfusion imaging was performed after a brief delay.  QPS Raw Data Images:  There is interference from nuclear activity from structures below the diaphragm.  This does not affect the ability to read the study. Stress Images:  There is decreased uptake in the inferobasal wall Rest Images:  There is decreased uptake in the inferobasal wall. Subtraction (SDS):  No evidence of ischemia. Transient Ischemic Dilatation:  0.87  (Normal <1.22)  Lung/Heart Ratio:  0.28  (Normal <0.45)  Quantitative Gated Spect Images QGS EDV:  111 ml QGS ESV:  46 ml QGS EF:  59 % QGS cine images:  Normal wall motion.   Overall Impression  Exercise Capacity: Excellent exercise capacity. BP Response: Normal blood pressure response. Clinical Symptoms: No chest pain ECG Impression: No significant ST segment change suggestive of ischemia. Overall Impression: Normal stress nuclear study with inferior thinning but no ischemia.  Appended Document: Cardiology Nuclear Testing GOOD  Appended Document: Cardiology Nuclear Testing pt aware

## 2010-03-08 ENCOUNTER — Encounter: Payer: Self-pay | Admitting: Cardiology

## 2010-03-09 ENCOUNTER — Encounter: Payer: Self-pay | Admitting: Cardiology

## 2010-03-09 ENCOUNTER — Ambulatory Visit (INDEPENDENT_AMBULATORY_CARE_PROVIDER_SITE_OTHER): Payer: Managed Care, Other (non HMO) | Admitting: Cardiology

## 2010-03-09 DIAGNOSIS — I2510 Atherosclerotic heart disease of native coronary artery without angina pectoris: Secondary | ICD-10-CM

## 2010-03-16 NOTE — Letter (Signed)
Summary: Cardiac Catheterization Instructions- [facility]  [facility], Main Office  [address]   [place], [place] [address]   Phone: [phone]  Fax: [phone]     03/09/2010 MRN: [id number]  DOUGLES KIMMEY [address] [place], [place]  [address]  [place]  Dear Mr. NAVES,   Bonita Quin are scheduled for a Cardiac Catheterization on Friday 03/26/10 with Dr. Clifton James  Please arrive to the 1st floor of the Heart and Vascular Center at [facility] at 8:30 am / pm on the day of your procedure. Please do not arrive before 6:30 a.m. Call the Heart and Vascular Center at [phone] if you are unable to make your appointmnet. The Code to get into the parking garage under the building is 3000. Take the elevators to the 1st floor. You must have someone to drive you home. Someone must be with you for the first 24 hours after you arrive home. Please wear clothes that are easy to get on and off and wear slip-on shoes. Do not eat or drink after midnight except water with your medications that morning. Bring all your medications and current insurance cards with you.  ___ DO NOT take these medications before your procedure: ________________________________________________________________  ___ Make sure you take your aspirin.  _X__ You may take ALL of your medications with water that morning. ________________________________________________________________________________________________________________________________  ___ DO NOT take ANY medications before your procedure.  ___ Pre-med instructions:  ________________________________________________________________________________________________________________________________  The usual length of stay after your procedure is 2 to 3 hours. This can vary.  If you have any questions, please call the office at the number listed above.   Meredith Staggers, RN  Appended Document: Cardiac Catheterization Instructions- [facility] cath  time resch he needs to be there at 9:30am have called pt and Left message to call back   Appended Document: Cardiac Catheterization Instructions- [facility] pt aware of time change

## 2010-03-16 NOTE — Miscellaneous (Signed)
  Clinical Lists Changes  Observations: Added new observation of PAST MED HX: CAD   Cypher stent.Marland Kitchen LAD - 2004 (seen last cardiology 2006) /  nuclear scan... 2005... excellent tolerance... no significant scar or ischemia... EF 51%  /  nuclear.. February 7,2012... no scar or ischemia... EF 59% anxiety osteopenia - T score -2.1 Ulcerative colitis LV.. normal by history   GERD Allergic rhinitis Esophagitis  Hyperlipidemia ..  MD roster: GI - kaplan cards - prev torelli, now [place] psyc - Kaur  optho - cohen derm - gruber  (03/08/2010 16:09) Added new observation of REFERRING MD: na (03/08/2010 16:09) Added new observation of PRIMARY MD: Rowe Clack MD (03/08/2010 16:09)       Past History:  Past Medical History: CAD   Cypher stent.Marland Kitchen LAD - 2004 (seen last cardiology 2006) /  nuclear scan... 2005... excellent tolerance... no significant scar or ischemia... EF 51%  /  nuclear.. February 7,2012... no scar or ischemia... EF 59% anxiety osteopenia - T score -2.1 Ulcerative colitis LV.. normal by history   GERD Allergic rhinitis Esophagitis  Hyperlipidemia ..  MD roster: GI - kaplan cards - prev torelli, now [place] psyc - Kaur  optho - cohen derm - Tonia Brooms

## 2010-03-16 NOTE — Assessment & Plan Note (Signed)
Summary: F/U STRESS  TEST/SF      Allergies Added: NKDA  Visit Type:  Follow-up Primary Provider:  Newt Lukes MD  CC:  CAD.  History of Present Illness: The patient is seen for followup of coronary artery disease.  When I saw her last decision was made to proceed with a stress nuclear scan.  This has been done..  There was no diagnostic abnormality on the nuclear scan.  Unfortunately he has been having some chest tightness.  At this point there is no definite proof of ischemia.  However the patient didn't have a stent to the LAD in 2004..  I cannot rule out the possibility of recurrent ischemia.  The nuclear test is 85% sensitive and specific.  Current Medications (verified): 1)  Azathioprine 50 Mg  Tabs (Azathioprine) .... Take 3 1/2 Tablets Daily( 175mg  Per Day) 2)  Pentasa 500 Mg Cr-Caps (Mesalamine) .... 2 Tablets By Mouth Four Times A Day 3)  Lexapro 10 Mg Tabs (Escitalopram Oxalate) .Marland Kitchen.. 1 Tablet By Mouth Once Daily 4)  Aspirin 81 Mg Tbec (Aspirin) .Marland Kitchen.. 1 Tablet By Mouth Once Daily 5)  Niacin 500 Mg Tabs (Niacin) .... 2 Tablets By Mouth Once Daily 6)  Multivitamins  Tabs (Multiple Vitamin) .Marland Kitchen.. 1 Tablet By Mouth Once Daily 7)  B Complex  Tabs (B Complex Vitamins) .Marland Kitchen.. 1 Tablet By Mouth Once Daily 8)  Plavix 75 Mg Tabs (Clopidogrel Bisulfate) .Marland Kitchen.. 1 Tablet By Mouth Once Daily 9)  Eql Coq10 300 Mg Caps (Coenzyme Q10) .... Once Daily 10)  Oscal 500/200 D-3 500-200 Mg-Unit Tabs (Calcium-Vitamin D) .Marland Kitchen.. 1 By Mouth Once Daily 11)  Simvastatin 10 Mg Tabs (Simvastatin) .... Take One Tablet By Mouth Daily At Bedtime  Allergies (verified): No Known Drug Allergies  Past History:  Past Medical History: CAD   Cypher stent.Marland Kitchen LAD - 2004 (seen last cardiology 2006) /  nuclear scan... 2005... excellent tolerance... no significant scar or ischemia... EF 51%  /  nuclear.. February 7,2012... no scar or ischemia... EF 59% anxiety osteopenia - T score -2.1 Ulcerative colitis LV.. normal  by history   GERD Allergic rhinitis Esophagitis .Marland Kitchen Hyperlipidemia ..  MD roster: GI - kaplan cards - prev torelli, now [place] psyc - Kaur  optho - cohen derm - gruber  Review of Systems       Patient denies fever, chills, headache, sweats, rash, change in vision, change in hearing, cough, nausea vomiting, urinary symptoms.  All other systems are reviewed and are negative.  Vital Signs:  Patient profile:   60 year old male Height:      68.5 inches Weight:      157 pounds BMI:     23.61 Pulse rate:   60 / minute BP sitting:   98 / 70  (left arm) Cuff size:   regular  Vitals Entered By: Hardin Negus, RMA (March 09, 2010 10:48 AM)  Physical Exam  General:  Patient is stable today. Head:  head is atraumatic. Eyes:  no xanthelasma. Neck:  no jugular venous distention. Chest Wall:  no chest wall tenderness. Lungs:  lungs are clear.  Respiratory effort is nonlabored. Heart:  cardiac exam reveals S1-S2.  No clicks or significant murmurs. Abdomen:  abdomen is soft. Msk:  no musculoskeletal knees. Extremities:  no peripheral edema. Skin:  no skin rashes. Psych:  patient is oriented to person time and place.  Affect is normal.   Impression & Recommendations:  Problem # 1:  DYSLIPIDEMIA (ICD-272.4) Lipids are being  treated.  No change in therapy.  Problem # 2:  CAD (ICD-414.00) The patient has known coronary disease.  He has been having some chest tightness.  His nuclear scan does not show significant ischemia.  In the past he has had a nuclear scan that did not show any significant abnormalities before his coronary artery disease was discovered.  At this point I cannot be sure if he has any ongoing significant stenoses or not.  Therefore after very careful discussion I decided to proceed with catheterization.  This will be arranged.  EKG is done today and reviewed by me.  There is no diagnostic change.  Other Orders: EKG w/ Interpretation (93000) Cardiac Catheterization  (Cardiac Cath)  Patient Instructions: 1)  Your physician recommends that you return for lab work in: week of 3/5 (bmet, cbc, pt 414.01, v72.81) 2)  Your physician has requested that you have a cardiac catheterization.  Cardiac catheterization is used to diagnose and/or treat various heart conditions. Doctors may recommend this procedure for a number of different reasons. The most common reason is to evaluate chest pain. Chest pain can be a symptom of coronary artery disease (CAD), and cardiac catheterization can show whether plaque is narrowing or blocking your heart's arteries. This procedure is also used to evaluate the valves, as well as measure the blood flow and oxygen levels in different parts of your heart.  For further information please visit [email or web address]  Please follow instruction sheet, as given.

## 2010-03-22 ENCOUNTER — Other Ambulatory Visit (INDEPENDENT_AMBULATORY_CARE_PROVIDER_SITE_OTHER): Payer: Managed Care, Other (non HMO)

## 2010-03-22 ENCOUNTER — Encounter: Payer: Self-pay | Admitting: Cardiology

## 2010-03-22 ENCOUNTER — Other Ambulatory Visit: Payer: Self-pay | Admitting: Cardiology

## 2010-03-22 DIAGNOSIS — I2510 Atherosclerotic heart disease of native coronary artery without angina pectoris: Secondary | ICD-10-CM

## 2010-03-22 DIAGNOSIS — Z01810 Encounter for preprocedural cardiovascular examination: Secondary | ICD-10-CM

## 2010-03-22 LAB — BASIC METABOLIC PANEL
BUN: 17 mg/dL (ref 6–23)
CO2: 29 mEq/L (ref 19–32)
Calcium: 9.1 mg/dL (ref 8.4–10.5)
Chloride: 104 mEq/L (ref 96–112)
Creatinine, Ser: 1.1 mg/dL (ref 0.4–1.5)
Glucose, Bld: 90 mg/dL (ref 70–99)
Potassium: 4.2 mEq/L (ref 3.5–5.1)
Sodium: 139 mEq/L (ref 135–145)

## 2010-03-22 LAB — CBC WITH DIFFERENTIAL/PLATELET
Basophils Relative: 0.6 % (ref 0.0–3.0)
Eosinophils Absolute: 0.1 10*3/uL (ref 0.0–0.7)
HCT: 41 % (ref 39.0–52.0)
Lymphs Abs: 1 10*3/uL (ref 0.7–4.0)
MCHC: 34.7 g/dL (ref 30.0–36.0)
MCV: 112.7 fl — ABNORMAL HIGH (ref 78.0–100.0)
Monocytes Absolute: 0.2 10*3/uL (ref 0.1–1.0)
Neutro Abs: 2.6 10*3/uL (ref 1.4–7.7)
Neutrophils Relative %: 65.4 % (ref 43.0–77.0)
Platelets: 207 10*3/uL (ref 150.0–400.0)
RBC: 3.64 Mil/uL — ABNORMAL LOW (ref 4.22–5.81)
RDW: 16.2 % — ABNORMAL HIGH (ref 11.5–14.6)

## 2010-03-22 LAB — PROTIME-INR
INR: 1.4 ratio — ABNORMAL HIGH (ref 0.8–1.0)
Prothrombin Time: 14.9 s — ABNORMAL HIGH (ref 10.2–12.4)

## 2010-03-26 ENCOUNTER — Inpatient Hospital Stay (HOSPITAL_BASED_OUTPATIENT_CLINIC_OR_DEPARTMENT_OTHER)
Admission: RE | Admit: 2010-03-26 | Discharge: 2010-03-26 | Disposition: A | Discharge status: Home / Self Care | Payer: Managed Care, Other (non HMO) | Source: Ambulatory Visit | Attending: Cardiovascular Disease | Admitting: Cardiovascular Disease

## 2010-03-26 DIAGNOSIS — R079 Chest pain, unspecified: Secondary | ICD-10-CM | POA: Insufficient documentation

## 2010-03-26 DIAGNOSIS — I2510 Atherosclerotic heart disease of native coronary artery without angina pectoris: Secondary | ICD-10-CM

## 2010-03-26 DIAGNOSIS — Z9861 Coronary angioplasty status: Secondary | ICD-10-CM | POA: Insufficient documentation

## 2010-03-29 ENCOUNTER — Encounter: Payer: Self-pay | Admitting: Internal Medicine

## 2010-03-29 ENCOUNTER — Ambulatory Visit (INDEPENDENT_AMBULATORY_CARE_PROVIDER_SITE_OTHER): Payer: Managed Care, Other (non HMO) | Admitting: Internal Medicine

## 2010-03-29 ENCOUNTER — Other Ambulatory Visit: Payer: Managed Care, Other (non HMO)

## 2010-03-29 ENCOUNTER — Other Ambulatory Visit: Payer: Self-pay | Admitting: Internal Medicine

## 2010-03-29 DIAGNOSIS — E785 Hyperlipidemia, unspecified: Secondary | ICD-10-CM

## 2010-03-29 DIAGNOSIS — K219 Gastro-esophageal reflux disease without esophagitis: Secondary | ICD-10-CM

## 2010-03-29 DIAGNOSIS — Z131 Encounter for screening for diabetes mellitus: Secondary | ICD-10-CM

## 2010-03-29 LAB — GLUCOSE, RANDOM: Glucose, Bld: 93 mg/dL (ref 70–99)

## 2010-03-29 LAB — LIPID PANEL
HDL: 52.1 mg/dL (ref >39.00)
Total CHOL/HDL Ratio: 3
Triglycerides: 37 mg/dL (ref 0.0–149.0)
VLDL: 7.4 mg/dL (ref 0.0–40.0)

## 2010-03-29 NOTE — Procedures (Signed)
Joseph Booker, Joseph Booker NO.:  [id number]  MEDICAL RECORD NO.:  [id number]           PATIENT TYPE:  LOCATION:                                 FACILITY:  PHYSICIAN:  Lauree Chandler, MDDATE OF BIRTH:  12/28/1950  DATE OF PROCEDURE:  03/26/2010 DATE OF DISCHARGE:                           CARDIAC CATHETERIZATION   PRIMARY CARDIOLOGIST:  Carlena Bjornstad, MD, [facility]  PRIMARY CARE PHYSICIAN:  Jannifer Rodney. Asa Lente, MD  PROCEDURES PERFORMED: 1. Left heart catheterization. 2. Selective coronary angiography. 3. Left ventricular angiogram.  OPERATOR:  Lauree Chandler, MD  INDICATIONS:  This is a 60 year old Caucasian male with a history of coronary artery disease with placement of a Cypher drug-eluting stent in the mid left anterior descending artery in 2004 by Dr. Rodell Perna. The patient has done well since then.  He recently has been having complaints of chest discomfort.  A nuclear stress test showed no ischemia.  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  This catheterization was arranged by Dr. Dola Argyle.  I was asked to perform the procedure.  DETAILS OF PROCEDURE:  The patient was brought to the Outpatient Cardiac Catheterization Laboratory after signing informed consent for the procedure.  The right groin was prepped and draped in a sterile fashion. Lidocaine 1% was used for local anesthesia.  A 4-French sheath was inserted into the right femoral artery without difficulty.  A JL-5 diagnostic catheter was used to perform selective angiography of left coronary system.  A 3-DRC catheter was used to perform selective angiography of the native right coronary artery.  A pigtail catheter was used to perform a left ventricular angiogram.  The patient tolerated the procedure well.  He was taken to  the recovery area in stable condition. There were no immediate complications.  HEMODYNAMIC FINDINGS:  Central aortic pressure 116/65.  Left ventricular pressure 115/6.  Left ventricular end-diastolic pressure 12.  ANGIOGRAPHIC FINDINGS: 1. The left main coronary artery had no angiographic evidence of     coronary artery disease.  This vessel bifurcated into the left     anterior descending artery and the circumflex artery. 2. Left anterior descending was a large vessel that coursed to the     apex and gave off 2 small caliber diagonal branches.  Both diagonal     branches were free of any disease.  The proximal left anterior     descending artery had 10-20% nonobstructive plaque.  The mid vessel     just after the first diagonal branch had a 30% stenosis.  This     appeared unchanged since his catheterization in 2004.  The stent in     the mid vessel was patent with mild 10% in-stent restenosis.  There     was mild 10% plaque in the distal LAD. 3. The circumflex artery was a moderate-sized vessel that gave off a     moderate-sized obtuse marginal branch.  The AV groove circumflex     became small  in caliber after the takeoff of the obtuse marginal     branch.  There was no obstructive disease noted in this vessel. 4. The right coronary artery is a large dominant vessel with mild 10%     plaque in the proximal and mid vessel.  There were no flow-limiting     lesions in this vessel.  This vessel terminated into a large     posterior descending artery and a large posterolateral branch. 5. Left ventricular angiogram performed in the RAO projection showed     normal left ventricular systolic function with ejection fraction of     55%.  There was no mitral regurgitation noted.  The aortic root was     not enlarged.  IMPRESSION: 1. Single-vessel coronary artery disease with patent stent in the mid     left anterior descending artery. 2. Mild to moderate disease in the remainder of the left  anterior     descending artery with no lesions that appeared to be flow-     limiting. 3. Normal left ventricular systolic function.  RECOMMENDATIONS:  I would recommend continued medical management at this time with no further ischemic workup.     Lauree Chandler, MD     CM/MEDQ  D:  03/26/2010  T:  03/27/2010  Job:  [id number]  cc:   Carlena Bjornstad, MD, [facility] Jannifer Rodney. Asa Lente, MD  Electronically Signed by Lauree Chandler MD on 03/29/2010 01:58:26 PM

## 2010-04-06 NOTE — Assessment & Plan Note (Signed)
Summary: SORE THROAT   STC   Vital Signs:  Patient profile:   60 year old male O2 Sat:      97 % on Room air Temp:     98.3 degrees F (36.83 degrees C) oral Pulse rate:   66 / minute BP sitting:   110 / 72  (left arm) Cuff size:   regular  Vitals Entered By: Orlan Leavens RMA (March 29, 2010 1:52 PM)  O2 Flow:  Room air CC: Sore throat Is Patient Diabetic? No Pain Assessment Patient in pain? no      Comments Pt states he ? if his systoms are acid reflux. Had strep test done came back negative, but still has scratchy throat & bad breath at times   Primary Care Provider:  Newt Lukes MD  CC:  Sore throat.  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 denies adverse side effects related to current therapy.   Clinical Review Panels:  Lipid Management   Cholesterol:  139 (08/07/2009)   LDL (bad choesterol):  80 (08/07/2009)   HDL (good cholesterol):  53.40 (08/07/2009)   Triglycerides:  46 (12/19/2008)  CBC   WBC:  3.9 (03/22/2010)   RBC:  3.64 (03/22/2010)   Hgb:  14.2 (03/22/2010)   Hct:  41.0 (03/22/2010)   Platelets:  207.0 (03/22/2010)   MCV  112.7 (03/22/2010)   MCHC  34.7 (03/22/2010)   RDW  16.2  (03/22/2010)   PMN:  65.4 (03/22/2010)   Lymphs:  25.0 (03/22/2010)   Monos:  5.7 (03/22/2010)   Eosinophils:  3.3 (03/22/2010)   Basophil:  0.6 (03/22/2010)  Complete Metabolic Panel   Glucose:  90 (03/22/2010)   Sodium:  139 (03/22/2010)   Potassium:  4.2 (03/22/2010)   Chloride:  104 (03/22/2010)   CO2:  29 (03/22/2010)   BUN:  17 (03/22/2010)   Creatinine:  1.1 (03/22/2010)   Albumin:  4.0 (08/07/2009)   Total Protein:  6.7 (08/07/2009)   Calcium:  9.1 (03/22/2010)   Total Bili:  1.0 (08/07/2009)   Alk Phos:  39 (08/07/2009)   SGPT (ALT):  26 (08/07/2009)   SGOT (AST):  34 (08/07/2009)   Current Medications (verified): 1)  Azathioprine 50 Mg  Tabs (Azathioprine) .... Take 3 1/2 Tablets Daily( 175mg  Per Day) 2)  Pentasa 500 Mg Cr-Caps (Mesalamine) .... 2 Tablets By Mouth Four Times A Day 3)  Lexapro 10 Mg Tabs (Escitalopram Oxalate) .Marland Kitchen.. 1 Tablet By Mouth Once Daily 4)  Aspirin 81 Mg Tbec (Aspirin) .Marland Kitchen.. 1 Tablet By Mouth Once Daily 5)  Niacin 500 Mg Tabs (Niacin) .... 2 Tablets By Mouth Once Daily 6)  Multivitamins  Tabs (Multiple Vitamin) .Marland Kitchen.. 1 Tablet By Mouth Once Daily 7)  B Complex  Tabs (B Complex Vitamins) .Marland Kitchen.. 1 Tablet By Mouth Once Daily 8)  Plavix 75 Mg Tabs (Clopidogrel Bisulfate) .Marland Kitchen.. 1 Tablet By Mouth Once Daily 9)  Eql Coq10 300 Mg Caps (Coenzyme Q10) .... Once Daily 10)  Oscal 500/200 D-3 500-200 Mg-Unit Tabs (Calcium-Vitamin D) .Marland Kitchen.. 1 By Mouth Once Daily 11)  Simvastatin 10 Mg Tabs (Simvastatin) .... Take One Tablet By Mouth Daily At Bedtime  Allergies (verified): No Known Drug Allergies  Past History:  Past Medical History: CAD   Cypher stent.Marland Kitchen LAD - 2004 (seen last cardiology 2006) /  nuclear scan... 2005... excellent tolerance... no significant scar or ischemia... EF 51%  /  nuclear.. February 7,2012... no scar or ischemia... EF 59% anxiety osteopenia - T score -2.1 Ulcerative colitis LV.. normal by history    GERD Allergic rhinitis Esophagitis  .Marland Kitchen Hyperlipidemia ..  MD roster: GI - kaplan cards - prev torelli, now [place] psyc - Kaur  optho - cohen derm - gruber  Review of Systems  The patient denies weight loss, chest pain, headaches, and hemoptysis.    Physical Exam  General:  alert, well-developed, well-nourished, and cooperative to examination.   Eyes:  vision grossly intact; pupils equal, round and reactive to light.  conjunctiva and lids normal.    Ears:  R ear normal and L ear normal.   Mouth:  teeth and gums in good repair; mucous membranes moist, without lesions or ulcers. oropharynx clear without exudate, min erythema.  Lungs:  normal respiratory effort, no intercostal retractions or use of accessory muscles; normal breath sounds bilaterally - no crackles and no wheezes.    Heart:  normal rate, regular rhythm, no murmur, and no rub. BLE without edema. Abdomen:  soft, non-tender, normal bowel sounds, no distention; no masses and no appreciable hepatomegaly or splenomegaly.     Impression & Recommendations:  Problem # 1:  GERD (ICD-530.81)  advised against otc med use due to plavix use - try rx PPI x 30d if not improved, f/u GI as planned His updated medication list for this problem includes:    Pantoprazole Sodium 40 Mg Tbec (Pantoprazole sodium) .Marland Kitchen... 1 by mouth two times a day x 2 weeks, then 1 by mouth once daily (or as directed)  EGD: Findings: Esophagitis  Location: [facility]   (02/27/2004)  Labs Reviewed: Hgb: 14.2 (03/22/2010)   Hct: 41.0 (03/22/2010)  Orders: Prescription Created Electronically [phone])  Problem # 2:  DYSLIPIDEMIA (ICD-272.4)  His updated medication list for this problem includes:    Niacin 500 Mg Tabs (Niacin) .Marland Kitchen... 2 tablets by mouth once daily    Simvastatin 10 Mg Tabs (Simvastatin) .Marland Kitchen... Take one tablet by mouth daily at bedtime  Orders: TLB-Lipid Panel (80061-LIPID)  Lipids are being treated.  No change in therapy.  Labs Reviewed: SGOT: 34  (08/07/2009)   SGPT: 26 (08/07/2009)   HDL:53.40 (08/07/2009), 45.90 (05/18/2009)  LDL:80 (08/07/2009), 98 (05/18/2009)  Chol:139 (08/07/2009), 157 (05/18/2009)  Trig:28.0 (08/07/2009), 64.0 (05/18/2009)  Problem # 3:  SCREENING, DIABETES MELLITUS (ICD-V77.1)  Orders: TLB-Glucose, QUANT (82947-GLU)  Complete Medication List: 1)  Azathioprine 50 Mg Tabs (Azathioprine) .... Take 3 1/2 tablets daily( 175mg  per day) 2)  Pentasa 500 Mg Cr-caps (Mesalamine) .... 2 tablets by mouth four times a day 3)  Lexapro 10 Mg Tabs (Escitalopram oxalate) .Marland Kitchen.. 1 tablet by mouth once daily 4)  Aspirin 81 Mg Tbec (Aspirin) .Marland KitchenMarland KitchenMarland Kitchen 1  tablet by mouth once daily 5)  Niacin 500 Mg Tabs (Niacin) .... 2 tablets by mouth once daily 6)  Multivitamins Tabs (Multiple vitamin) .Marland Kitchen.. 1 tablet by mouth once daily 7)  B Complex Tabs (B complex vitamins) .Marland Kitchen.. 1 tablet by mouth once daily 8)  Plavix 75 Mg Tabs (Clopidogrel bisulfate) .Marland Kitchen.. 1 tablet by mouth once daily 9)  Eql Coq10 300 Mg Caps (Coenzyme q10) .... Once daily 10)  Oscal 500/200 D-3 500-200 Mg-unit Tabs (Calcium-vitamin d) .Marland Kitchen.. 1 by mouth once daily 11)  Simvastatin 10 Mg Tabs (Simvastatin) .... Take one tablet by mouth daily at bedtime 12)  Pantoprazole Sodium 40 Mg Tbec (Pantoprazole sodium) .Marland Kitchen.. 1 by mouth two times a day x 2 weeks, then 1 by mouth once daily (or as directed)  Patient Instructions: 1)  it was good to see you today. 2)  use generic protonix for reflux symptoms - your prescriptions have been electronically submitted to your pharmacy. Please take as directed. Contact our office if you believe you're having problems with the medication(s).  3)  If reflux not improved with this treatment, follow up with Dr. Arlyce Dice as discussed 4)  test(s) ordered today as requested - your results will be mailed to you after review in 48-72 hours from the time of test completion 5)  Please schedule a follow-up appointment in 6 months (or as scheduled), call sooner if  problems.  Prescriptions: PANTOPRAZOLE SODIUM 40 MG TBEC (PANTOPRAZOLE SODIUM) 1 by mouth two times a day x 2 weeks, then 1 by mouth once daily (or as directed)  #60 x 3   Entered and Authorized by:   Newt Lukes MD   Signed by:   Newt Lukes MD on 03/29/2010   Method used:   Electronically to        [id number] (retail)       [address]       [place], [place]  [address]       Ph: [phone] or [phone]       Fax: [phone]   RxID:   [id number]    Orders Added: 1)  TLB-Lipid Panel [80061-LIPID] 2)  TLB-Glucose, QUANT [82947-GLU] 3)  Est. Patient Level IV [64403] 4)  Prescription Created Electronically [id number]

## 2010-04-14 ENCOUNTER — Encounter: Payer: Self-pay | Admitting: Cardiology

## 2010-04-28 ENCOUNTER — Encounter: Payer: Self-pay | Admitting: Cardiology

## 2010-04-28 ENCOUNTER — Other Ambulatory Visit: Payer: Managed Care, Other (non HMO) | Admitting: *Deleted

## 2010-04-28 ENCOUNTER — Ambulatory Visit (INDEPENDENT_AMBULATORY_CARE_PROVIDER_SITE_OTHER): Payer: Managed Care, Other (non HMO) | Admitting: Cardiology

## 2010-04-28 DIAGNOSIS — I2510 Atherosclerotic heart disease of native coronary artery without angina pectoris: Secondary | ICD-10-CM | POA: Insufficient documentation

## 2010-04-28 DIAGNOSIS — R0789 Other chest pain: Secondary | ICD-10-CM | POA: Insufficient documentation

## 2010-04-28 NOTE — Assessment & Plan Note (Signed)
Patient's cardiac status is stable.  His catheter shows that he has no significant obstructive disease.  No further workup is needed.

## 2010-04-28 NOTE — Progress Notes (Signed)
HPI The patient is seen today to followup his cardiac catheterization.  See the complete report.  His stent site looks excellent.  He has minimal other scattered disease.  He tolerated the procedure well.  The patient has had some vague chest tightness since his catheterization.  This does not appear to be cardiac in origin.  No Known Allergies  Current Outpatient Prescriptions  Medication Sig Dispense Refill  . aspirin 81 MG tablet Take 81 mg by mouth daily.        Marland Kitchen azaTHIOprine (IMURAN) 50 MG tablet 50 mg. 3 1/2 tab po daily       . B Complex Vitamins (VITAMIN B COMPLEX PO) 1 po daily       . calcium-vitamin D (OSCAL WITH D) 500-200 MG-UNIT per tablet Take 1 tablet by mouth daily.        . clopidogrel (PLAVIX) 75 MG tablet Take 75 mg by mouth daily.        . Coenzyme Q10 (EQL COQ10) 300 MG CAPS 1 po daily       . escitalopram (LEXAPRO) 10 MG tablet Take 10 mg by mouth daily.        . mesalamine (PENTASA) 500 MG CR capsule 500 mg. 2 po qid       . Multiple Vitamin (MULTIVITAMIN) capsule Take 1 capsule by mouth daily.        . niacin (NIASPAN) 500 MG CR tablet 500 mg. 2 po daily       . pantoprazole (PROTONIX) 40 MG tablet Take 40 mg by mouth daily.        . simvastatin (ZOCOR) 10 MG tablet Take 10 mg by mouth at bedtime.          History   Social History  . Marital Status: Married    Spouse Name: N/A    Number of Children: 1  . Years of Education: N/A   Occupational History  . Systems developer    Social History Main Topics  . Smoking status: Never Smoker   . Smokeless tobacco: Not on file  . Alcohol Use: Yes  . Drug Use: No  . Sexually Active: Not on file   Other Topics Concern  . Not on file   Social History Narrative  . No narrative on file    Family History  Problem Relation Age of Onset  . Diabetes Mother   . Heart disease Mother   . Diabetes Father   . Heart disease Father 77    MI    Past Medical History  Diagnosis Date  . Anxiety   . Osteopenia   .  Ulcerative colitis   . GERD (gastroesophageal reflux disease)   . Allergic rhinitis   . Esophagitis   . Hyperlipidemia   . CAD (coronary artery disease)     [name] / nuclear 2005,, excellent tolerance no scar or ischemia, EF 51% / nuclear, February, 2012, no scar or ischemia / catheterization March 26, 2010.. 10% in-stent restenosis, minimal other nonobstructive coronary disease, ejection fraction 55%., excellent result  . Chest tightness     April, 2012    Past Surgical History  Procedure Date  . Angioplasty     stent 2004  . Hernia repair     2006    ROS  Patient denies fever, chills, headache, sweats, rash, change in vision, change in hearing, chest pain, cough, nausea vomiting, urinary symptoms.  All other systems are reviewed and are negative.  PHYSICAL EXAM Patient is oriented  to person time and place.  Affect is normal.  There is no xanthelasma.  Lungs are clear.  Respiratory effort is not labored.  There is no drug abuse distention.  Cardiac exam reveals S1-S2.  No clicks or significant murmurs.  The abdomen is soft.  There is no peripheral edema.  Filed Vitals:   04/28/10 1103  BP: 112/68  Pulse: 60  Resp: 18  Height: 5\' 9"  (1.753 m)  Weight: 156 lb (70.761 kg)    EKG EKG is done today and reviewed by me.  It is normal.  ASSESSMENT & PLAN

## 2010-04-28 NOTE — Patient Instructions (Signed)
Your physician wants you to follow-up in: 1 year. You will receive a reminder letter in the mail two months in advance. If you don't receive a letter, please call our office to schedule the follow-up appointment.  

## 2010-04-28 NOTE — Assessment & Plan Note (Signed)
The patient has vague chest tightness today.  His EKG is normal.  I believe that this is not cardiac in origin.  No further workup.

## 2010-06-01 ENCOUNTER — Other Ambulatory Visit: Payer: Self-pay | Admitting: Internal Medicine

## 2010-06-04 NOTE — Discharge Summary (Signed)
NAMECHIOKE, NOXON NO.:  [id number]   MEDICAL RECORD NO.:  [id number]                   PATIENT TYPE:  OIB   LOCATION:  7741                                 FACILITY:  [facility]   PHYSICIAN:  Barnett Abu, M.D.               DATE OF BIRTH:  07-08-1950   DATE OF ADMISSION:  07/25/2002  DATE OF DISCHARGE:  07/26/2002                                 DISCHARGE SUMMARY   ADMISSION DIAGNOSES:  1. High-grade left anterior descending disease, as evidenced at diagnostic     cardiac catheterization July 11, 2002.  2. Preserved left ventricular function, ejection fraction 60%-65%.  3. Ulcerative colitis.  4. Osteopenia.  5. History of LDL 123, elevated Lpa (by [place]).  64. Strong family history of premature coronary artery disease.   DISCHARGE DIAGNOSES:  1. Elective left anterior descending intervention, successful.  2. Diagnosis of elevated LDL and LPa--Zocor therapy initiated.  3. Preserved left ventricular function, ejection fraction 60%-65%.  4. Ulcerative colitis.  5. Osteopenia.  77. Strong family history of premature coronary artery disease.   HISTORY OF PRESENT ILLNESS:  Mr. Brissett is a pleasant 60 year old gentleman  who was initially seen by Dr. Jaci Standard on July 01, 2002 with complaints of  stabbing chest pain and shortness of breath.  A regular stress test was  performed by his primary care Jaimeson Gopal and showed no inducible ischemia  after 13 minutes and 42 seconds of exercise; however, he has had progressive  chest pain with exertion.  He is concerned and he has significant risk  factors for coronary artery disease including a very strong family history  of premature coronary artery disease and an elevated LPa performed at the  [facility] and an LDL of 123.   Diagnostic cardiac catheterization was performed at the [facility] on July 11, 2002 by  Dr. Jaci Standard and revealed critical proximal LAD disease of 70%  (single  vessel).  Preserved EF at 60%-65%.   The patient is now admitted to [facility] for elective intervention  to the LAD by Dr. Leonia Reeves.  The risks and the benefits of the procedure have  been reviewed and he agrees to proceed.   PROCEDURES:  Cardiac catheterization with intervention to the LAD on July 25, 2002 by Dr. Leonia Reeves.   COMPLICATIONS:  None.   CONSULTATIONS:  None.   HOSPITAL COURSE:  Mr. Hern was brought to [facility] on July 25, 2002 for elective cardiac catheterization and intervention to the LAD.  Preprocedure laboratory studies revealed a BUN of 11 and creatinine 1.  Potassium 4.  Hemoglobin 14.5 and platelets 251.  INR 1.16.   Catheterization with intervention by Dr. Leonia Reeves was performed without  difficulty.  He reduced a 75% mid LAD lesion to 0% and placed a 3.5 x 18 mm  Cypher stent.  There was no  angina with device insertion.  There was good  hemostasis after Perclose and [place]-Seal.  The patient tolerated the  procedure well.   On July 26, 2002, the patient continued to be stable.  Hemoglobin 13.3 and  platelets 203.  Potassium 3.6, BUN 8, and creatinine 1.   The right groin is stable.   The patient will be discharged to home today in improved condition.   DISCHARGE MEDICATIONS:  1. Plavix 75 mg a day for six months.  2. Enteric-coated aspirin 325 mg a day.  3. Actonel 30 mg a week.  4. Nexium 40 mg a day.  5. Pentasa 400 mg a day.  6. Folic acid 459 mg a day.  7. Calcium plus D 1200 mg a day.  8. Multivitamin.  9. Zocor 40 mg at bedtime.  10.      Nitroglycerin under the tongue as needed for chest pain.   No strenuous activity, lifting more than 5 pounds, or driving for the next  24-48 hours.  Low fat, low cholesterol, low salt.  No soaking in the tub or  swimming for one week.   He is asked to call the office with any problems or questions.   Followup has been scheduled with Dr. Jaci Standard for Friday, July 23, at 10   o'clock.  He will also have a followup statin panel and an appointment with  the cholesterol clinic Tuesday, August 10, at 10 o'clock.  If there are any  problems or questions in the interim, he will call.     Dellia Nims Jernejcic, P.A.                   Barnett Abu, M.D.    TCJ/MEDQ  D:  07/26/2002  T:  07/26/2002  Job:  [id number]   cc:   Fabio Asa, M.D.  [address] [address]  Fax: [phone]   Zella Richer. Burnett Harry, M.D.  [place]  [place] [address]  Fax: [phone]    cc:   Fabio Asa, M.D.  [address] [address]  Fax: [phone]   Zella Richer. Burnett Harry, M.D.  [place]  [place] [address]  Fax: [phone]

## 2010-06-04 NOTE — Cardiovascular Report (Signed)
Joseph Booker, Joseph Booker NO.:  [id number]   MEDICAL RECORD NO.:  [id number]                   PATIENT TYPE:  OIB   LOCATION:  2852                                 FACILITY:  [facility]   PHYSICIAN:  Barnett Abu, M.D.               DATE OF BIRTH:  January 20, 1950   DATE OF PROCEDURE:  07/25/2002  DATE OF DISCHARGE:                              CARDIAC CATHETERIZATION   PROCEDURE PERFORMED:  1. PCI/drug eluding stent implantation mid left anterior descending.  2. Percutaneous closure right femoral artery (Perclose).   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  He  underwent a myocardial perfusion study which showed reversible lateral and  inferolateral defect.  Cardiac catheterization revealed a 70-80% LAD  stenosis.  No significant obstruction was seen in the LCX or right coronary  system.  The patient has been advised that this stenosis does not correspond  to the area of ischemia noted and may not relieve his chest pain.  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  He is brought now to the cardiac catheterization laboratory to  provide for percutaneous revascularization.   PROCEDURAL NOTE:  The patient is brought to the cardiac catheterization  laboratory in a fasting state.  The right groin was prepped and draped in  the usual sterile fashion.  Local anesthesia was obtained with the  infiltration of 1% lidocaine.  A 6-French catheter sheath was inserted  percutaneously into the right femoral artery utilizing an anterior approach  over a guiding J-wire.  A 6-French #4 CLS guiding catheter was advanced to  the ascending aorta where the left coronary os was engaged.  The patient had  received 54 mg of bivalirudin intravenously.  He also received a confident  infusion of 1.75 mg/kg/hour during the procedure.  The initial ACT was 377  seconds.  A 0.014 inch Scimed Luge  intracoronary guidewire was passed across  the lesion in the mid LAD without difficulty.  The stent was deployed at a  peak pressure of 14 atmospheres for approximately one minute.  This resulted  in wide patency and good coverage of the lesion.  No evidence of edge  dissection seen.  There was good TIMI grade III flow following the  insertion.  Adequate patency was confirmed in orthogonal views both with and  without the guidewire in place.  The guiding catheter was then removed.  A  right femoral arteriogram was performed via the sheath using hand injection  in a 45 degree RAO angulation.  This documented the right femoral artery to  be widely patent with the arteriotomy site well above the bifurcation into  the profunda femoris and superficial femoral arteries.  The artery was quite  tortuous.  The Perclose device was deployed successfully.  There was some  fairly brisk  oozing at completion that resolved significantly with pressure  alone for 10 minutes.  The Chitoseal was applied with a good result  subsequently.   FINAL IMPRESSION:  1. Successful elective percutaneous coronary intervention with drug eluding     stent implantation mid left anterior descending.  This was using a 3.5/18     mm cortis Cypher stent.  2. Successful percutaneous closure right femoral artery.  3. Typical angina was not reproduced with device insertion or balloon     inflation.                                               Barnett Abu, M.D.    JHE/MEDQ  D:  07/25/2002  T:  07/26/2002  Job:  [id number]  Fabio Asa, M.D.  [place] [place] [address]  Fax: [phone]   Zella Richer. Burnett Harry, M.D.  [address]  [place] [address]  Fax: [phone]   cc:   Fabio Asa, M.D.  [address] [place] [address]  Fax: [phone]   Zella Richer. Burnett Harry, M.D.  [place]  [place] [address]  Fax: [phone]

## 2010-06-04 NOTE — Assessment & Plan Note (Signed)
[facility] OFFICE NOTE   ALEXAVIER, TSUTSUI                          MRN:          [id number]  DATE:04/17/2006                            DOB:          Apr 16, 1950    PROBLEM:  Ulcerative colitis.   REASON FOR VISIT:  Mr. Chilson has returned for an annual visit.  He  remains in clinical remission on a regimen of Imuran 175 mg a day and  Pentasa 1 gm four times a day.  Though instructed to have his liver  tests and blood counts checked several times a year, he has not had any  blood work for one year.  He has no GI complaints including abdominal  pain, diarrhea, or bleeding.   MEDICATIONS:  Plavix, Pentasa, Imuran, baby aspirin, red rice, yeast,  and Niacin.  He is ALLERGIC TO DEMEROL.   PHYSICAL EXAMINATION:  VITAL SIGNS:  Pulse 74, blood pressure 118/62,  weight 158.   IMPRESSION:  Ulcerative colitis - in remission.   RECOMMENDATION:  1. Check LFTs and CBC today and every three to four months.  2. Followup colonoscopy.     Sandy Salaam. Deatra Ina, MD,FACG  Electronically Signed    RDK/MedQ  DD: 04/17/2006  DT: 04/17/2006  Job #: [id number]   cc:   Darrick Penna. Linna Darner, MD,FACP,FCCP  Carlena Bjornstad, MD, [facility]

## 2010-06-18 ENCOUNTER — Telehealth: Payer: Self-pay | Admitting: Cardiology

## 2010-06-18 NOTE — Telephone Encounter (Signed)
Walk In Pt Form " Pt Needs records for [employer] License" Pt signed ROI,I copied records called pt for Pick up 06/18/10/km

## 2010-06-23 ENCOUNTER — Encounter: Payer: Self-pay | Admitting: Gastroenterology

## 2010-06-28 ENCOUNTER — Encounter: Payer: Self-pay | Admitting: Internal Medicine

## 2010-06-28 ENCOUNTER — Ambulatory Visit (INDEPENDENT_AMBULATORY_CARE_PROVIDER_SITE_OTHER): Payer: Managed Care, Other (non HMO) | Admitting: Internal Medicine

## 2010-06-28 VITALS — BP 112/72 | HR 58 | Temp 99.5°F | Ht 68.5 in

## 2010-06-28 DIAGNOSIS — K5190 Ulcerative colitis, unspecified, without complications: Secondary | ICD-10-CM

## 2010-06-28 DIAGNOSIS — J329 Chronic sinusitis, unspecified: Secondary | ICD-10-CM

## 2010-06-28 MED ORDER — LEVOFLOXACIN 500 MG PO TABS: 500.0000 mg | ORAL_TABLET | Freq: Every day | ORAL | Status: AC

## 2010-06-28 NOTE — Progress Notes (Signed)
  Subjective:     Joseph Booker is a 60 y.o. male who presents for evaluation of sinus pain. Symptoms include: facial pain, fevers, headaches, nasal congestion, sinus pressure, sneezing, sore throat and tooth pain. Onset of symptoms was 2 days ago. Symptoms have been rapidly worsening since that time. Past history is significant for no history of pneumonia or bronchitis. Patient is a non-smoker. Last flare sinusitis 12/2009 - improved with levaquin  The following portions of the patient's history were reviewed and updated as appropriate: allergies, current medications, past medical history, past social history and problem list.  Review of Systems Constitutional: negative for anorexia Respiratory: negative for dyspnea on exertion, hemoptysis and wheezing Cardiovascular: negative for chest pressure/discomfort and palpitations Genitourinary:negative for dysuria and urinary incontinence   Objective:    BP 112/72  Pulse 58  Temp(Src) 99.5 F (37.5 C) (Oral)  Ht 5' 8.5" (1.74 m)  SpO2 97% General appearance: alert, cooperative and no distress Head: Normocephalic, without obvious abnormality, atraumatic, sinuses tender to percussion Eyes: conjunctivae/corneas clear. PERRL, EOM's intact. Fundi benign. Nose: no discharge, sinus tenderness left Throat: lips, mucosa, and tongue normal; teeth and gums normal Neck: no adenopathy, no carotid bruit, no JVD, supple, symmetrical, trachea midline and thyroid not enlarged, symmetric, no tenderness/mass/nodules Lungs: clear to auscultation bilaterally Heart: regular rate and rhythm, S1, S2 normal, no murmur, click, rub or gallop    Assessment:    Acute bacterial sinusitis.    Plan:    Nasal saline sprays. Levaquin per medication orders.

## 2010-06-28 NOTE — Assessment & Plan Note (Signed)
Follows with GI ([facility]) for same - chronic immune suppression - No flares in years - stable symptoms on current treatment surveillance colonoscopy ongoing as per GI -

## 2010-06-28 NOTE — Patient Instructions (Signed)
It was good to see you today. Levaquin - Your prescription(s) have been submitted to your pharmacy. Please take as directed and contact our office if you believe you are having problem(s) with the medication(s). try nasal saline or Neti pot if congestion worse If unimproved after treatment, call for other treatment/evaluation as needed, sooner if symptoms worse

## 2010-07-05 ENCOUNTER — Telehealth: Payer: Self-pay

## 2010-07-05 MED ORDER — AMOXICILLIN-POT CLAVULANATE 875-125 MG PO TABS: 1.0000 | ORAL_TABLET | Freq: Two times a day (BID) | ORAL | Status: DC

## 2010-07-05 NOTE — Telephone Encounter (Signed)
Pt called stating he is still sick with sinus pressure and pain. Pt has completed Levaquin course but is requesting alternate ABX therapy, please advise

## 2010-07-05 NOTE — Telephone Encounter (Signed)
erx Augmentin done - if continued problems after this additional week, will need ROV to reeval symptoms, sooner if worse - thanks

## 2010-07-05 NOTE — Telephone Encounter (Signed)
Pt advised of Md's recommendations

## 2010-07-22 ENCOUNTER — Other Ambulatory Visit: Payer: Self-pay

## 2010-07-22 MED ORDER — SIMVASTATIN 10 MG PO TABS: 10.0000 mg | ORAL_TABLET | Freq: Every day | ORAL | Status: DC

## 2010-07-25 ENCOUNTER — Other Ambulatory Visit: Payer: Self-pay | Admitting: Internal Medicine

## 2010-07-27 ENCOUNTER — Other Ambulatory Visit: Payer: Self-pay | Admitting: *Deleted

## 2010-07-27 ENCOUNTER — Telehealth: Payer: Self-pay | Admitting: Cardiology

## 2010-07-27 MED ORDER — CLOPIDOGREL BISULFATE 75 MG PO TABS: 75.0000 mg | ORAL_TABLET | Freq: Every day | ORAL | Status: DC

## 2010-07-27 NOTE — Telephone Encounter (Signed)
PT CALLING TO SEE IF CAN CHANGE TO GENERIC PLAVIX 75 MG, IF SO NEEDS REFILL SENT TO [facility]

## 2010-08-11 ENCOUNTER — Other Ambulatory Visit: Payer: Self-pay | Admitting: Cardiology

## 2010-09-03 ENCOUNTER — Encounter: Payer: Self-pay | Admitting: Internal Medicine

## 2010-10-06 ENCOUNTER — Other Ambulatory Visit: Payer: Self-pay

## 2010-10-11 MED ORDER — SIMVASTATIN 10 MG PO TABS: 10.0000 mg | ORAL_TABLET | Freq: Every day | ORAL | Status: DC

## 2010-11-26 ENCOUNTER — Ambulatory Visit (INDEPENDENT_AMBULATORY_CARE_PROVIDER_SITE_OTHER): Payer: Managed Care, Other (non HMO) | Admitting: Internal Medicine

## 2010-11-26 ENCOUNTER — Encounter: Payer: Self-pay | Admitting: Internal Medicine

## 2010-11-26 VITALS — BP 110/72 | HR 72 | Temp 99.4°F

## 2010-11-26 DIAGNOSIS — J069 Acute upper respiratory infection, unspecified: Secondary | ICD-10-CM

## 2010-11-26 MED ORDER — AZITHROMYCIN 250 MG PO TABS: ORAL_TABLET | ORAL | Status: AC

## 2010-11-26 MED ORDER — HYDROCODONE-HOMATROPINE 5-1.5 MG/5ML PO SYRP: 5.0000 mL | ORAL_SOLUTION | Freq: Four times a day (QID) | ORAL | Status: AC | PRN

## 2010-11-26 MED ORDER — BENZONATATE 100 MG PO CAPS: 100.0000 mg | ORAL_CAPSULE | Freq: Three times a day (TID) | ORAL | Status: DC | PRN

## 2010-11-26 NOTE — Progress Notes (Signed)
  Subjective:    Patient ID: Joseph Booker, male    DOB: [birth year], 60 y.o.   MRN: [id number]  HPI  complains of cough associated with PND, sneezing but clear discharge Onset 5 days ago +sick contacts  Past Medical History  Diagnosis Date  . Osteopenia   . Esophagitis   . CAD (coronary artery disease)     [name] / nuclear 2005, excellent tolerance no scar or ischemia, EF 51% / nuclear, February, 2012, no scar or ischemia / catheterization March 26, 2010.. 10% in-stent restenosis, minimal other nonobstructive coronary disease, ejection fraction 55%., excellent result  . INGUINAL HERNIA   . OSTEOPENIA   . VITAMIN D DEFICIENCY   . ALLERGIC RHINITIS   . ANEMIA, IRON DEFICIENCY   . Anxiety state, unspecified   . ULCERATIVE COLITIS-UNIVERSAL   . Hyperlipidemia   . GERD (gastroesophageal reflux disease)   . DYSLIPIDEMIA      Review of Systems  Constitutional: Negative for fever, chills and fatigue.  HENT: Positive for sinus pressure. Negative for ear pain.   Respiratory: Negative for shortness of breath and wheezing.   Cardiovascular: Negative for chest pain and leg swelling.  Neurological: Negative for syncope and headaches.       Objective:   Physical Exam BP 110/72  Pulse 72  Temp(Src) 99.4 F (37.4 C) (Oral)  SpO2 97% Wt Readings from Last 3 Encounters:  04/28/10 156 lb (70.761 kg)  03/09/10 157 lb (71.215 kg)  02/23/10 156 lb (70.761 kg)   Constitutional:  He appears well-developed and well-nourished. No distress.  HENT: Nontender to palpation over sinuses, ears clear bilaterally; oropharynx without erythema Neck: Normal range of motion. Neck supple. No JVD present. No thyromegaly present.  Cardiovascular: Normal rate, regular rhythm and normal heart sounds.  No murmur heard. no BLE edema Pulmonary/Chest: Effort normal and breath sounds normal. No respiratory distress. no wheezes.  Psychiatric: he has a normal mood and affect. behavior is normal.  Judgment and thought content normal.        Assessment & Plan:  URI - tendency towards sinusitis but no evidence for bacterial infections at this time  Zpak to fill if symptoms worse but explained no role for antibiotics in viral disease symptomatic treatment with antitussives: Hydromet and/or Tessalon - rx done

## 2010-11-26 NOTE — Patient Instructions (Signed)
It was good to see you today. Z-Pak antibiotic prescription provided to you today to fill if you develop worsening symptoms or fever, but it does not appear necessary to use antibiotics at this time. Cough medication for suppression as discussed: Hydromet at night and Tessalon during the day as needed -  these prescription(s) have been submitted to your pharmacy. Please take as directed and contact our office if you believe you are having problem(s) with the medication(s). Okay to continue DayQuil as ongoing, focus on adequate hydration and rest Let us know if referral to ENT specialist as needed

## 2010-12-07 ENCOUNTER — Telehealth: Payer: Self-pay

## 2010-12-07 MED ORDER — AMOXICILLIN-POT CLAVULANATE 875-125 MG PO TABS: 1.0000 | ORAL_TABLET | Freq: Two times a day (BID) | ORAL | Status: AC

## 2010-12-07 NOTE — Telephone Encounter (Signed)
augmentin erx done - stop Zpak

## 2010-12-07 NOTE — Telephone Encounter (Signed)
Pt called stating he waited 10 days before starting the ABX in the hope that sxs would subside. Pt still congested, sinus pressure and discomfort. Pt is requesting ABX - Amox?

## 2010-12-07 NOTE — Telephone Encounter (Signed)
Pt advised via VM that new ABX has been sent to pharmacy per pt request

## 2010-12-15 ENCOUNTER — Other Ambulatory Visit: Payer: Self-pay | Admitting: Gastroenterology

## 2011-02-03 ENCOUNTER — Encounter: Payer: Self-pay | Admitting: Gastroenterology

## 2011-02-15 ENCOUNTER — Ambulatory Visit (INDEPENDENT_AMBULATORY_CARE_PROVIDER_SITE_OTHER): Payer: Managed Care, Other (non HMO) | Admitting: Gastroenterology

## 2011-02-15 ENCOUNTER — Encounter: Payer: Self-pay | Admitting: Gastroenterology

## 2011-02-15 ENCOUNTER — Telehealth: Payer: Self-pay | Admitting: Cardiology

## 2011-02-15 ENCOUNTER — Telehealth: Payer: Self-pay | Admitting: Gastroenterology

## 2011-02-15 VITALS — BP 118/72 | HR 82 | Ht 68.5 in | Wt 161.0 lb

## 2011-02-15 DIAGNOSIS — K5190 Ulcerative colitis, unspecified, without complications: Secondary | ICD-10-CM

## 2011-02-15 MED ORDER — SOD PHOS MONO-SOD PHOS DIBASIC 1.102-0.398 G PO TABS: 1.0000 | ORAL_TABLET | Freq: Once | ORAL | Status: DC

## 2011-02-15 MED ORDER — SIMVASTATIN 10 MG PO TABS: 10.0000 mg | ORAL_TABLET | Freq: Every day | ORAL | Status: DC

## 2011-02-15 NOTE — Telephone Encounter (Signed)
N/A.  LMTC. 

## 2011-02-15 NOTE — Progress Notes (Signed)
History of Present Illness:  Joseph Booker has returned for his annual visit. He's had ulcerative colitis for 16 years and is maintained Imuran and Pentasa. He has no GI complaints.    Review of Systems: Pertinent positive and negative review of systems were noted in the above HPI section. All other review of systems were otherwise negative.    Current Medications, Allergies, Past Medical History, Past Surgical History, Family History and Social History were reviewed in [employer] [facility] electronic medical record  Vital signs were reviewed in today's medical record. Physical Exam: General: Well developed , well nourished, no acute distress Head: Normocephalic and atraumatic Eyes:  sclerae anicteric, EOMI Ears: Normal auditory acuity Mouth: No deformity or lesions Lungs: Clear throughout to auscultation Heart: Regular rate and rhythm; no murmurs, rubs or bruits Abdomen: Soft, non tender and non distended. No masses, hepatosplenomegaly or hernias noted. Normal Bowel sounds Rectal:deferred Musculoskeletal: Symmetrical with no gross deformities  Pulses:  Normal pulses noted Extremities: No clubbing, cyanosis, edema or deformities noted Neurological: Alert oriented x 4, grossly nonfocal Psychological:  Alert and cooperative. Normal mood and affect

## 2011-02-15 NOTE — Telephone Encounter (Signed)
Called pt to inform the directions on the OsmoPrep

## 2011-02-15 NOTE — Telephone Encounter (Signed)
Pt requesting written rx for Simvastatin.

## 2011-02-15 NOTE — Telephone Encounter (Signed)
F/U  Patient returning nurse DL call, he can be reached at [phone]

## 2011-02-15 NOTE — Assessment & Plan Note (Signed)
Remains in remission on Imuran and Pentasa  Recommendations #1 continue current medications #2 check LFTs and CBC #3 surveillance colonoscopy

## 2011-02-15 NOTE — Telephone Encounter (Signed)
New problem Pt wants paper rx for simvastatin 10 mg 30 day supply until he gets mail order. Please call when ready

## 2011-02-15 NOTE — Patient Instructions (Signed)
Your Colonoscopy is scheduled on 03/04/2011 at 9am Your Prep has been sent to your pharmacy Waiver for OsmoPrep has been signed You will go to the basement today for labs

## 2011-02-16 ENCOUNTER — Other Ambulatory Visit (INDEPENDENT_AMBULATORY_CARE_PROVIDER_SITE_OTHER): Payer: Managed Care, Other (non HMO)

## 2011-02-16 DIAGNOSIS — K5190 Ulcerative colitis, unspecified, without complications: Secondary | ICD-10-CM

## 2011-02-16 LAB — CBC WITH DIFFERENTIAL/PLATELET
Eosinophils Relative: 5 % (ref 0.0–5.0)
HCT: 39 % (ref 39.0–52.0)
Hemoglobin: 13.5 g/dL (ref 13.0–17.0)
Lymphs Abs: 1.1 10*3/uL (ref 0.7–4.0)
Monocytes Relative: 4.4 % (ref 3.0–12.0)
Neutro Abs: 2.9 10*3/uL (ref 1.4–7.7)
RBC: 3.55 Mil/uL — ABNORMAL LOW (ref 4.22–5.81)
WBC: 4.5 10*3/uL (ref 4.5–10.5)

## 2011-02-16 LAB — HEPATIC FUNCTION PANEL
ALT: 24 U/L (ref 0–53)
AST: 29 U/L (ref 0–37)
Albumin: 4 g/dL (ref 3.5–5.2)

## 2011-02-17 ENCOUNTER — Telehealth: Payer: Self-pay | Admitting: Cardiology

## 2011-02-17 NOTE — Telephone Encounter (Signed)
FU Call: Pt returning call from [place]. Please call pt back.

## 2011-02-17 NOTE — Telephone Encounter (Signed)
Pt was notified that his rx is ready to pick up.  Rx left at the front desk.

## 2011-02-17 NOTE — Telephone Encounter (Signed)
Rx signed.  N/A at pt number.

## 2011-03-02 ENCOUNTER — Ambulatory Visit (AMBULATORY_SURGERY_CENTER): Payer: Managed Care, Other (non HMO) | Admitting: Gastroenterology

## 2011-03-02 ENCOUNTER — Encounter: Payer: Self-pay | Admitting: Gastroenterology

## 2011-03-02 VITALS — BP 116/66 | HR 62 | Temp 98.1°F | Resp 17 | Ht 68.0 in | Wt 161.0 lb

## 2011-03-02 DIAGNOSIS — K5190 Ulcerative colitis, unspecified, without complications: Secondary | ICD-10-CM

## 2011-03-02 DIAGNOSIS — D126 Benign neoplasm of colon, unspecified: Secondary | ICD-10-CM

## 2011-03-02 MED ORDER — SODIUM CHLORIDE 0.9 % IV SOLN: 500.0000 mL | INTRAVENOUS | Status: DC

## 2011-03-02 NOTE — Progress Notes (Signed)
Patient did not experience any of the following events: a burn prior to discharge; a fall within the facility; wrong site/side/patient/procedure/implant event; or a hospital transfer or hospital admission upon discharge from the facility. (G8907) Patient did not have preoperative order for IV antibiotic SSI prophylaxis. (G8918)  

## 2011-03-02 NOTE — Patient Instructions (Signed)
Please refer to your blue and neon green sheets for instructions regarding diet and activity for the rest of today.  You may resume your medications as you would normally take them.  

## 2011-03-02 NOTE — Op Note (Signed)
[facility] [address] [place], [place]  [address]  COLONOSCOPY PROCEDURE REPORT  PATIENT:  Joseph Booker, Joseph Booker  MR#:  [id number] BIRTHDATE:  02-29-1950, 60 yrs. old  GENDER:  male ENDOSCOPIST:  Barbette Hair. Arlyce Dice, MD REF. BY: PROCEDURE DATE:  03/02/2011 PROCEDURE:  Colonoscopy with biopsy ASA CLASS:  Class II INDICATIONS:  evaluation of ulcerative colitis MEDICATIONS:   MAC sedation, administered by CRNA propofol 350mg IV  DESCRIPTION OF PROCEDURE:   After the risks benefits and alternatives of the procedure were thoroughly explained, informed consent was obtained.  Digital rectal exam was performed and revealed no abnormalities.   The LB CF-[id number] endoscope was introduced through the anus and advanced to the cecum, which was identified by both the appendix and ileocecal valve, without limitations.  The quality of the prep was excellent, using MoviPrep.  The instrument was then slowly withdrawn as the colon was fully examined. <<PROCEDUREIMAGES>>  FINDINGS:  A normal appearing cecum, ileocecal valve, and appendiceal orifice were identified. The ascending, hepatic flexure, transverse, splenic flexure, descending, sigmoid colon, and rectum appeared unremarkable (see image2, image3, image5, and image6). Random biopsies were taken every 10cm throughout the colon   Retroflexed views in the rectum revealed no abnormalities. The time to cecum =  1) 11.25  minutes. The scope was then withdrawn in  1) 7.0  minutes from the cecum and the procedure completed. COMPLICATIONS:  None ENDOSCOPIC IMPRESSION: 1) Normal colon RECOMMENDATIONS: 1) Continue current medications REPEAT EXAM:  In 1 year(s) for Colonoscopy.  ______________________________ Barbette Hair. Arlyce Dice, MD  CC:  Newt Lukes, MD  n. Rosalie DoctorBarbette Hair. Marica Trentham at 03/02/2011 11:55 AM  Mallie Snooks, [phone]

## 2011-03-03 ENCOUNTER — Telehealth: Payer: Self-pay

## 2011-03-03 NOTE — Telephone Encounter (Signed)
Left message on answering machine. 

## 2011-03-04 ENCOUNTER — Encounter: Payer: Managed Care, Other (non HMO) | Admitting: Gastroenterology

## 2011-03-08 ENCOUNTER — Encounter: Payer: Self-pay | Admitting: Gastroenterology

## 2011-06-15 ENCOUNTER — Encounter: Payer: Self-pay | Admitting: Cardiology

## 2011-06-15 ENCOUNTER — Ambulatory Visit (INDEPENDENT_AMBULATORY_CARE_PROVIDER_SITE_OTHER): Payer: Managed Care, Other (non HMO) | Admitting: Cardiology

## 2011-06-15 VITALS — BP 130/78 | HR 53 | Ht 68.0 in | Wt 156.0 lb

## 2011-06-15 DIAGNOSIS — I2510 Atherosclerotic heart disease of native coronary artery without angina pectoris: Secondary | ICD-10-CM

## 2011-06-15 DIAGNOSIS — E785 Hyperlipidemia, unspecified: Secondary | ICD-10-CM

## 2011-06-15 MED ORDER — SIMVASTATIN 10 MG PO TABS: 10.0000 mg | ORAL_TABLET | Freq: Every day | ORAL | Status: DC

## 2011-06-15 MED ORDER — CLOPIDOGREL BISULFATE 75 MG PO TABS: 75.0000 mg | ORAL_TABLET | Freq: Every day | ORAL | Status: DC

## 2011-06-15 NOTE — Assessment & Plan Note (Signed)
Patient needs a fasting lipid profile. This will be arranged.

## 2011-06-15 NOTE — Patient Instructions (Addendum)
Your physician recommends that you return for lab work: 06/16/11 anytime after 8:15am.  (lipid, liver, cbc, bmet, tsh)  Your physician wants you to follow-up in: 1 year.  You will receive a reminder letter in the mail two months in advance. If you don't receive a letter, please call our office to schedule the follow-up appointment.

## 2011-06-15 NOTE — Assessment & Plan Note (Signed)
The patient's coronary status is stable. He needs no further workup. He needs a fasting lipid profile and other screening labs.

## 2011-06-15 NOTE — Progress Notes (Signed)
HPI  Patient returns for cardiology followup. I saw him last April, 2012. He has known coronary disease. He's been very stable. In 2012 decision was made to proceed with further testing. Ultimately cardiac catheterization was done. The study showed 10% in-stent restenosis in his stent and minimal nonobstructive disease elsewhere. LV function was normal. He also had had a stress test that showed no marked abnormalities. He continues to have rare chest tightness.  No Known Allergies  Current Outpatient Prescriptions  Medication Sig Dispense Refill  . aspirin 81 MG tablet Take 81 mg by mouth daily.        Marland Kitchen azaTHIOprine (IMURAN) 50 MG tablet 50 mg. 3 1/2 tab po daily       . B Complex Vitamins (VITAMIN B COMPLEX PO) 1 po daily       . calcium-vitamin D (OSCAL WITH D) 500-200 MG-UNIT per tablet Take 1 tablet by mouth daily.        . clopidogrel (PLAVIX) 75 MG tablet Take 1 tablet (75 mg total) by mouth daily.  90 tablet  3  . Coenzyme Q10 (EQL COQ10) 300 MG CAPS 1 po daily       . escitalopram (LEXAPRO) 10 MG tablet Take 10 mg by mouth daily.        . fish oil-omega-3 fatty acids 1000 MG capsule Take 1 g by mouth daily.      . Multiple Vitamin (MULTIVITAMIN) capsule Take 1 capsule by mouth daily.        . niacin (NIASPAN) 500 MG CR tablet 500 mg. 2 po daily       . PENTASA 500 MG CR capsule TAKE 2 CAPSULES FOUR TIMES A DAY  720 capsule  2  . simvastatin (ZOCOR) 10 MG tablet Take 1 tablet (10 mg total) by mouth at bedtime.  30 tablet  3  . sodium phosphates (VISICOL) 1.102-0.398 G TABS Take 1 tablet by mouth once. As per Prep instructions  32 tablet  0    History   Social History  . Marital Status: Married    Spouse Name: N/A    Number of Children: 1  . Years of Education: N/A   Occupational History  . Herbalist    Social History Main Topics  . Smoking status: Never Smoker   . Smokeless tobacco: Never Used   Comment: Married, 1 stepson, 5 g-kids. Retired Emergency planning/management officer, now  Herbalist  . Alcohol Use: 0.6 oz/week    1 Glasses of wine per week     weekly  . Drug Use: No  . Sexually Active: Not on file   Other Topics Concern  . Not on file   Social History Narrative  . No narrative on file    Family History  Problem Relation Age of Onset  . Diabetes Mother   . Heart disease Mother   . Diabetes Father   . Heart disease Father 50    MI    Past Medical History  Diagnosis Date  . Osteopenia   . Esophagitis   . CAD (coronary artery disease)     [name] / nuclear 2005, excellent tolerance no scar or ischemia, EF 51% / nuclear, February, 2012, no scar or ischemia / catheterization March 26, 2010.. 10% in-stent restenosis, minimal other nonobstructive coronary disease, ejection fraction 55%., excellent result  . INGUINAL HERNIA   . OSTEOPENIA   . VITAMIN D DEFICIENCY   . ALLERGIC RHINITIS   . ANEMIA, IRON DEFICIENCY   .  Anxiety state, unspecified   . ULCERATIVE COLITIS-UNIVERSAL   . Hyperlipidemia   . GERD (gastroesophageal reflux disease)   . DYSLIPIDEMIA   . URI (upper respiratory infection)     Past Surgical History  Procedure Date  . Angioplasty     stent 2004  . Hernia repair     2006    ROS   Patient denies fever, chills, headache, sweats, rash, change in vision, change in hearing, chest pain, cough, nausea vomiting, urinary symptoms. All other systems are reviewed and are negative.  PHYSICAL EXAM  Patient is stable. He is oriented to person time and place. Affect is normal. Lungs are clear. Respiratory effort is nonlabored. There is no jugular venous distention. Cardiac exam reveals S1 and S2. There no clicks or significant murmurs. Abdomen is soft. There is no peripheral edema.  There were no vitals filed for this visit.  EKG is done today and reviewed by me. It is normal. ASSESSMENT & PLAN

## 2011-06-16 ENCOUNTER — Other Ambulatory Visit (INDEPENDENT_AMBULATORY_CARE_PROVIDER_SITE_OTHER): Payer: Managed Care, Other (non HMO)

## 2011-06-16 DIAGNOSIS — I2510 Atherosclerotic heart disease of native coronary artery without angina pectoris: Secondary | ICD-10-CM

## 2011-06-16 DIAGNOSIS — E785 Hyperlipidemia, unspecified: Secondary | ICD-10-CM

## 2011-06-16 LAB — CBC WITH DIFFERENTIAL/PLATELET
Basophils Relative: 0.9 % (ref 0.0–3.0)
Eosinophils Relative: 3.2 % (ref 0.0–5.0)
Lymphocytes Relative: 26.4 % (ref 12.0–46.0)
Neutrophils Relative %: 65.4 % (ref 43.0–77.0)
RBC: 3.63 Mil/uL — ABNORMAL LOW (ref 4.22–5.81)
WBC: 3.5 10*3/uL — ABNORMAL LOW (ref 4.5–10.5)

## 2011-06-16 LAB — BASIC METABOLIC PANEL
BUN: 16 mg/dL (ref 6–23)
Creatinine, Ser: 0.9 mg/dL (ref 0.4–1.5)
GFR: 94.15 mL/min (ref >60.00)
Glucose, Bld: 86 mg/dL (ref 70–99)
Potassium: 3.8 mEq/L (ref 3.5–5.1)

## 2011-06-16 LAB — LIPID PANEL
Triglycerides: 98 mg/dL (ref 0.0–149.0)
VLDL: 19.6 mg/dL (ref 0.0–40.0)

## 2011-06-16 LAB — HEPATIC FUNCTION PANEL
ALT: 40 U/L (ref 0–53)
Alkaline Phosphatase: 45 U/L (ref 39–117)
Bilirubin, Direct: 0.2 mg/dL (ref 0.0–0.3)
Total Bilirubin: 1.5 mg/dL — ABNORMAL HIGH (ref 0.3–1.2)
Total Protein: 7.1 g/dL (ref 6.0–8.3)

## 2011-06-16 LAB — TSH: TSH: 0.73 u[IU]/mL (ref 0.35–5.50)

## 2011-06-28 ENCOUNTER — Telehealth: Payer: Self-pay | Admitting: Cardiology

## 2011-06-28 NOTE — Telephone Encounter (Signed)
Lab results were given to pt.  He was also told to contact Dr Basilio Cairo regarding his cbc and bili.

## 2011-06-28 NOTE — Telephone Encounter (Signed)
Fu call °Patient returning your call °

## 2011-07-06 ENCOUNTER — Encounter: Payer: Self-pay | Admitting: Internal Medicine

## 2011-07-06 ENCOUNTER — Ambulatory Visit (INDEPENDENT_AMBULATORY_CARE_PROVIDER_SITE_OTHER): Payer: Managed Care, Other (non HMO) | Admitting: Internal Medicine

## 2011-07-06 VITALS — BP 112/70 | HR 65 | Temp 98.2°F | Ht 68.5 in | Wt 156.0 lb

## 2011-07-06 DIAGNOSIS — D72819 Decreased white blood cell count, unspecified: Secondary | ICD-10-CM

## 2011-07-06 DIAGNOSIS — N400 Enlarged prostate without lower urinary tract symptoms: Secondary | ICD-10-CM | POA: Insufficient documentation

## 2011-07-06 MED ORDER — SAW PALMETTO (SERENOA REPENS) 80 MG PO CAPS: 80.0000 mg | ORAL_CAPSULE | Freq: Two times a day (BID) | ORAL | Status: DC

## 2011-07-06 NOTE — Progress Notes (Signed)
Subjective:    Patient ID: Joseph Booker, male    DOB: 09/16/1950, 61 y.o.   MRN: [id number]  HPI Here to discuss recent lab abnormalities: mild CBC and tot bili on recent labs -  Also increase in nocturnal polyuria - no dysuria  also review of other medical issues:   CAD -stent to LAD 2004 -  follows annually with cardiologist Ron Parker),  no chest pain or anginal symptoms - 100% compliant with meds   UC - no flares since 2009 - 100% compliant with meds as rx'd by GI - no adv se on current tx   osteoporosis - no bone pains - related to prior steroid use - not on fosamax for years and does not take Ca or vit d regularly   dyslipidemia - reports compliance with ongoing medical treatment and no changes in medication dose or frequency. denies adverse side effects related to current therapy.     Past Medical History  Diagnosis Date  . Osteopenia   . Esophagitis   . CAD (coronary artery disease)     [name] / nuclear 2005, excellent tolerance no scar or ischemia, EF 51% / nuclear, February, 2012, no scar or ischemia / catheterization March 26, 2010.. 10% in-stent restenosis, minimal other nonobstructive coronary disease, ejection fraction 55%., excellent result  . INGUINAL HERNIA   . OSTEOPENIA   . VITAMIN D DEFICIENCY   . ALLERGIC RHINITIS   . ANEMIA, IRON DEFICIENCY   . Anxiety state, unspecified   . ULCERATIVE COLITIS-UNIVERSAL   . Hyperlipidemia   . GERD (gastroesophageal reflux disease)      Review of Systems  Respiratory: Negative for cough and shortness of breath.   Cardiovascular: Negative for chest pain and palpitations.  Genitourinary: Positive for difficulty urinating. Negative for dysuria, urgency, hematuria, flank pain, decreased urine volume and testicular pain.       Objective:   Physical Exam BP 112/70  Pulse 65  Temp 98.2 F (36.8 C) (Oral)  Ht 5' 8.5" (1.74 m)  Wt 156 lb (70.761 kg)  BMI 23.37 kg/m2  SpO2 95% Wt Readings from Last 3  Encounters:  07/06/11 156 lb (70.761 kg)  06/15/11 156 lb (70.761 kg)  03/02/11 161 lb (73.029 kg)   Constitutional:  He appears well-developed and well-nourished. No distress.  Neck: Normal range of motion. Neck supple. No JVD present. No thyromegaly present.  Cardiovascular: Normal rate, regular rhythm and normal heart sounds.  No murmur heard. no BLE edema Pulmonary/Chest: Effort normal and breath sounds normal. No respiratory distress. no wheezes.  Abdominal: Soft. Bowel sounds are normal. Patient exhibits no distension. There is no tenderness.  GU: rectal with slight hypertrophy and soft, no nodules or mass Skin: Skin is warm and dry.  No erythema or ulceration.  Psychiatric: he has a normal mood and affect. behavior is normal. Judgment and thought content normal.   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

## 2011-07-06 NOTE — Patient Instructions (Addendum)
It was good to see you today. We have reviewed your prior records including labs and tests today Try saw palmetto for your prostate symptoms - let us know if symptoms progressive despite this medication Other Medications reviewed, no changes at this time.  Please schedule followup in 6 months for physical and labs, call sooner if problems. Benign Prostatic Hypertrophy   The prostate gland is part of the reproductive system of men. A normal prostate is about the size and shape of a walnut. The prostate gland makes a fluid that is mixed with sperm to make semen. This gland surrounds the urethra and is located in front of the rectum and just below the bladder. The bladder is where urine is stored. The urethra is the tube through which urine passes from the bladder to get out of the body. The prostate grows as a man ages. An enlarged prostate not caused by cancer is called benign prostatic hypertrophy (BPH). This is a common health problem in men over age 28. This condition is a normal part of aging. An enlarged prostate presses on the urethra. This makes it harder to pass urine. In the early stages of enlargement, the bladder can get by with a narrowed urethra by forcing the urine through. If the problem gets worse, medical or surgical treatment may be required.   This condition should be followed by your caregiver. Longstanding back pressure on the kidneys can cause infection. Back pressure and infection can progress to bladder damage and kidney (renal) failure. If needed, your caregiver may refer you to a specialist in kidney and prostate disease (urologist). CAUSES   The exact cause is not known.   SYMPTOMS    You are not able to completely empty your bladder.   Getting up often during the night to urinate.   Need to urinate frequently during the day.   Difficultly in starting urine flow.   Decrease in size and strength of the urine stream.   Dribbling after urination.   Pain on urination  (more common with infection).   Inability to pass your water. This needs immediate treatment.  DIAGNOSIS   These tests will help your caregiver understand your problem:  Digital rectal exam (DRE). In a rectal exam, your caregiver checks your prostate by putting a gloved, lubricated finger into the rectum to feel the back of your prostate gland. This exam detects the size of the gland and abnormal lumps or growths.   Urinalysis (exam of the urine). This may include a culture if there is concern about infection.   Prostate Specific Antigen (PSA). This is a blood test used to screen for prostate cancer. It is not used alone for diagnosing prostate cancer.   Rectal ultrasound (sonogram). This test uses sound waves to electronically produce a "picture" of the prostate. It helps examine the prostate gland for cancer.  TREATMENT   Mild symptoms may not need treatment. Simple observation and yearly exams may be all that is required. Medications and surgery are options for more severe problems. Your caregiver can help you make an informed decision for what is best. Two classes of medications are available for relief of prostate symptoms:  Medications that shrink the prostate. This helps relieve symptoms.   Uncommon side effects include problems with sexual function.   Medications to relax the muscle of the prostate. This also relieves the obstruction.   Side effects can include dizziness, fatigue, lightheadedness, and retrograde ejaculation (diminished volume of ejaculate).  Several types of surgical  treatments are available for relief of prostate symptoms:  Transurethral resection of the prostate (TURP). In this treatment, an instrument is inserted through opening at the tip of the penis. It is used to cut away pieces of the inner core of the prostate. The pieces are removed through the same opening of the penis. This removes the obstruction and helps get rid of the symptoms.   Transurethral  incision (TUIP). In this procedure, small cuts are made in the prostate. This lessens the prostates pressure on the urethra.   Transurethral microwave thermotherapy (TUMT). This procedure uses microwaves to create heat. The heat destroys and removes a small amount of prostate tissue.   Transurethral needle ablation (TUNA). This is a procedure that uses radio frequencies to do the same as TUMT.   Interstitial laser coagulation ([place]). This is a procedure that uses a laser to do the same as TUMT and TUNA.   Transurethral electrovaporization (TUVP). This is a procedure that uses electrodes to do the same as the procedures listed above.  Regardless of the method of treatment chosen, you and your caregiver will discuss the options. With this knowledge, you along with your caregiver can decide upon the best treatment for you. SEEK MEDICAL CARE IF:    You develop chills, fever of 100.5 F (38.1 C), or night sweats.   There is unexplained back pain.   Symptoms are not helped by medications prescribed.   You develop medication side effects.   Your urine becomes very dark or has a bad smell.  SEEK IMMEDIATE MEDICAL CARE IF:    You are suddenly unable to urinate. This is an emergency. You should be seen immediately.   There are large amounts of blood or clots in the urine.   Your urinary problems become unmanageable.   You develop lightheadedness, severe dizziness, or you feel faint.   You develop moderate to severe low back or flank pain.   You develop chills or fever.  Document Released: 01/03/2005 Document Revised: 12/23/2010 Document Reviewed: 09/25/2006 [facility] Patient Information 2012 [place].

## 2011-07-06 NOTE — Assessment & Plan Note (Signed)
Mild nocturnal symptoms Declines rx med - will try OTC 1st Will check PSA next visit/CPX Education provided Lab Results  Component Value Date   PSA 2.59 05/18/2009   PSA 2.66 12/19/2008

## 2011-07-21 ENCOUNTER — Other Ambulatory Visit: Payer: Self-pay | Admitting: Gastroenterology

## 2011-08-08 ENCOUNTER — Telehealth: Payer: Self-pay | Admitting: Cardiology

## 2011-08-08 DIAGNOSIS — I2510 Atherosclerotic heart disease of native coronary artery without angina pectoris: Secondary | ICD-10-CM

## 2011-08-08 NOTE — Telephone Encounter (Signed)
Pt calling re the [place] pt needs EKG and exercise  stress test same day can get order, pls call pt [phone]

## 2011-08-08 NOTE — Telephone Encounter (Signed)
Spoke with patient and stated these are done every year.  Will forward to Debbie L. LPN to make sure ok.  Then if ok can be sent to scheduling.

## 2011-08-09 NOTE — Telephone Encounter (Signed)
Does Mr Hinnant need a stress test?  If so, regular ETT or Nuclear?  Thanks.

## 2011-08-10 ENCOUNTER — Telehealth: Payer: Self-pay | Admitting: Cardiology

## 2011-08-10 NOTE — Telephone Encounter (Signed)
PT NEEDS STRESS TEST PER FAA, THEY ARE NOT REQUIRING NUCLEAR  THIS TIME, CAN WE GO AHEAD AND SCHEDULE? REALLY NEEDS BEFORE DR Ron Parker GETS BACK IF POSSIBLE  [phone]

## 2011-08-10 NOTE — Telephone Encounter (Signed)
Pt was notified that I will call him as soon as I check with Dr [name] regarding need for stress test and what type is necessary.

## 2011-08-18 NOTE — Telephone Encounter (Signed)
Standard GXT is fine if this is what he says the FAA wants.

## 2011-08-19 NOTE — Telephone Encounter (Signed)
LM for pt that he will receive a call to schedule this.

## 2011-10-05 ENCOUNTER — Encounter: Payer: Self-pay | Admitting: Physician Assistant

## 2011-10-05 ENCOUNTER — Ambulatory Visit (INDEPENDENT_AMBULATORY_CARE_PROVIDER_SITE_OTHER): Payer: Managed Care, Other (non HMO) | Admitting: Physician Assistant

## 2011-10-05 DIAGNOSIS — I2510 Atherosclerotic heart disease of native coronary artery without angina pectoris: Secondary | ICD-10-CM

## 2011-10-05 NOTE — Procedures (Signed)
Exercise Treadmill Test  Pre-Exercise Testing Evaluation Rhythm: normal sinus  Rate: 66   PR:  .13 QRS:  .09  QT:  .39 QTc: .41     Test  Exercise Tolerance Test Ordering MD: Willa Rough, MD  Interpreting MD: Tereso Newcomer , PA-C  Unique Test No: 1  Treadmill:  1  Indication for ETT: known ASHD  Contraindication to ETT: No   Stress Modality: exercise - treadmill  Cardiac Imaging Performed: non   Protocol: standard Bruce - maximal  Max BP:  191/77  Max MPHR (bpm):  161 85% MPR (bpm):  137  MPHR obtained (bpm):  157 % MPHR obtained:  97%  Reached 85% MPHR (min:sec):  10:05 Total Exercise Time (min-sec):  13:14  Workload in METS:  15.7 Borg Scale: 16  Reason ETT Terminated:  desired heart rate attained    ST Segment Analysis At Rest: normal ST segments - no evidence of significant ST depression With Exercise: no evidence of significant ST depression  Other Information Arrhythmia:  1 Ventricular Couplet noted at Max Stress Angina during ETT:  absent (0) Quality of ETT:  diagnostic  ETT Interpretation:  normal - no evidence of ischemia by ST analysis  Comments: Excellent exercise tolerance. No chest pain. Normal BP response to exercise. No ST-T changes to suggest ischemia.   Recommendations: Results reviewed with Mallie Snooks  Follow up with Dr. Zackery Barefoot as directed Tereso Newcomer, PA-C  10:04 AM 10/05/2011

## 2011-10-13 ENCOUNTER — Telehealth: Payer: Self-pay | Admitting: Cardiology

## 2011-10-13 NOTE — Telephone Encounter (Signed)
Walk In pt Form " Pt Dropped Off Letter from [name]" sent to Joseph Booker  10/13/11/KM

## 2011-10-14 ENCOUNTER — Encounter: Payer: Self-pay | Admitting: Gastroenterology

## 2011-10-17 ENCOUNTER — Encounter: Payer: Self-pay | Admitting: Cardiology

## 2011-10-17 ENCOUNTER — Telehealth: Payer: Self-pay

## 2011-10-17 NOTE — Telephone Encounter (Signed)
Left detailed message on pt's VM that his paperwork ([place]) is ready to be picked up at the front office./LV

## 2011-10-17 NOTE — Progress Notes (Signed)
   We're currently putting together data to give to the [facility] for the patient. I personally saw him last in the office in May, 2013. He had a standard treadmill done by Mr. Tereso Newcomer PA-C in September, 2013. I have reviewed this study. There were no diagnostic EKG changes. The study was negative. The patient underwent cardiac catheterization in 2012. He had 10% in-stent restenosis in his stent. There was minimal nonobstructive disease elsewhere. Left ventricular function is normal.  The patient continues on his cardiac meds. These are listed in the progress note that I have copied from Jun 15, 2011. The patient is stable. He will continue on these medications.  The plan for cardiology followup will be in one year from May, 2013. Of course we will see him if he has any significant change in his status.  Jerral Bonito, MD, [facility]

## 2012-01-04 ENCOUNTER — Encounter: Payer: Self-pay | Admitting: Internal Medicine

## 2012-01-04 ENCOUNTER — Other Ambulatory Visit (INDEPENDENT_AMBULATORY_CARE_PROVIDER_SITE_OTHER): Payer: Managed Care, Other (non HMO)

## 2012-01-04 ENCOUNTER — Ambulatory Visit (INDEPENDENT_AMBULATORY_CARE_PROVIDER_SITE_OTHER): Payer: Managed Care, Other (non HMO) | Admitting: Internal Medicine

## 2012-01-04 VITALS — BP 130/72 | HR 65 | Temp 98.7°F | Ht 68.5 in | Wt 162.4 lb

## 2012-01-04 DIAGNOSIS — R5381 Other malaise: Secondary | ICD-10-CM

## 2012-01-04 DIAGNOSIS — J309 Allergic rhinitis, unspecified: Secondary | ICD-10-CM

## 2012-01-04 DIAGNOSIS — Z0000 Encounter for general adult medical examination without abnormal findings: Secondary | ICD-10-CM

## 2012-01-04 DIAGNOSIS — E785 Hyperlipidemia, unspecified: Secondary | ICD-10-CM

## 2012-01-04 DIAGNOSIS — K5190 Ulcerative colitis, unspecified, without complications: Secondary | ICD-10-CM

## 2012-01-04 DIAGNOSIS — R5383 Other fatigue: Secondary | ICD-10-CM

## 2012-01-04 DIAGNOSIS — Z23 Encounter for immunization: Secondary | ICD-10-CM

## 2012-01-04 LAB — CBC WITH DIFFERENTIAL/PLATELET
Basophils Absolute: 0 10*3/uL (ref 0.0–0.1)
Eosinophils Absolute: 0.2 10*3/uL (ref 0.0–0.7)
Eosinophils Relative: 4.3 % (ref 0.0–5.0)
HCT: 43 % (ref 39.0–52.0)
Lymphs Abs: 0.9 10*3/uL (ref 0.7–4.0)
MCHC: 33.9 g/dL (ref 30.0–36.0)
MCV: 109.8 fl — ABNORMAL HIGH (ref 78.0–100.0)
Monocytes Absolute: 0.2 10*3/uL (ref 0.1–1.0)
Neutrophils Relative %: 66 % (ref 43.0–77.0)
Platelets: 229 10*3/uL (ref 150.0–400.0)
RDW: 15.9 % — ABNORMAL HIGH (ref 11.5–14.6)

## 2012-01-04 LAB — HEPATIC FUNCTION PANEL
Alkaline Phosphatase: 50 U/L (ref 39–117)
Bilirubin, Direct: 0.2 mg/dL (ref 0.0–0.3)
Total Bilirubin: 1.6 mg/dL — ABNORMAL HIGH (ref 0.3–1.2)
Total Protein: 7.5 g/dL (ref 6.0–8.3)

## 2012-01-04 LAB — LIPID PANEL
Cholesterol: 152 mg/dL (ref 0–200)
LDL Cholesterol: 89 mg/dL (ref 0–99)
VLDL: 11.4 mg/dL (ref 0.0–40.0)

## 2012-01-04 LAB — BASIC METABOLIC PANEL
BUN: 17 mg/dL (ref 6–23)
CO2: 28 mEq/L (ref 19–32)
Chloride: 105 mEq/L (ref 96–112)
Creatinine, Ser: 1 mg/dL (ref 0.4–1.5)
Glucose, Bld: 97 mg/dL (ref 70–99)
Potassium: 4.1 mEq/L (ref 3.5–5.1)

## 2012-01-04 MED ORDER — FLUTICASONE PROPIONATE 50 MCG/ACT NA SUSP: 2.0000 | Freq: Every day | NASAL | Status: DC

## 2012-01-04 NOTE — Assessment & Plan Note (Signed)
Follows with GI (kaplan) for same - chronic immune suppression - No flares in years - stable symptoms on current treatment surveillance colonoscopy ongoing as per GI - 

## 2012-01-04 NOTE — Assessment & Plan Note (Signed)
Increase seasonal symptoms  Start flonase - erx done

## 2012-01-04 NOTE — Patient Instructions (Addendum)
It was good to see you today. Health Maintenance reviewed - all recommended immunizations and age-appropriate screenings are up-to-date. Test(s) ordered today. Your results will be released to MyChart (or called to you) after review, usually within 72hours after test completion. If any changes need to be made, you will be notified at that same time. Medications reviewed, add Flonase - no other changes at this time. Your prescription(s) have been printed for you to submit to your pharmacy. Please take as directed and contact our office if you believe you are having problem(s) with the medication(s). Please schedule followup in 6-12 months, call sooner if problems.  Health Maintenance, Males A healthy lifestyle and preventative care can promote health and wellness.  Maintain regular health, dental, and eye exams.   Eat a healthy diet. Foods like vegetables, fruits, whole grains, low-fat dairy products, and lean protein foods contain the nutrients you need without too many calories. Decrease your intake of foods high in solid fats, added sugars, and salt. Get information about a proper diet from your caregiver, if necessary.   Regular physical exercise is one of the most important things you can do for your health. Most adults should get at least 150 minutes of moderate-intensity exercise (any activity that increases your heart rate and causes you to sweat) each week. In addition, most adults need muscle-strengthening exercises on 2 or more days a week.     Maintain a healthy weight. The body mass index (BMI) is a screening tool to identify possible weight problems. It provides an estimate of body fat based on height and weight. Your caregiver can help determine your BMI, and can help you achieve or maintain a healthy weight. For adults 20 years and older:   A BMI below 18.5 is considered underweight.   A BMI of 18.5 to 24.9 is normal.   A BMI of 25 to 29.9 is considered overweight.   A BMI of  30 and above is considered obese.   Maintain normal blood lipids and cholesterol by exercising and minimizing your intake of saturated fat. Eat a balanced diet with plenty of fruits and vegetables. Blood tests for lipids and cholesterol should begin at age 49 and be repeated every 5 years. If your lipid or cholesterol levels are high, you are over 50, or you are a high risk for heart disease, you may need your cholesterol levels checked more frequently. Ongoing high lipid and cholesterol levels should be treated with medicines, if diet and exercise are not effective.   If you smoke, find out from your caregiver how to quit. If you do not use tobacco, do not start.   If you choose to drink alcohol, do not exceed 2 drinks per day. One drink is considered to be 12 ounces (355 mL) of beer, 5 ounces (148 mL) of wine, or 1.5 ounces (44 mL) of liquor.   Avoid use of street drugs. Do not share needles with anyone. Ask for help if you need support or instructions about stopping the use of drugs.   High blood pressure causes heart disease and increases the risk of stroke. Blood pressure should be checked at least every 1 to 2 years. Ongoing high blood pressure should be treated with medicines if weight loss and exercise are not effective.   If you are 59 to 59 years old, ask your caregiver if you should take aspirin to prevent heart disease.   Diabetes screening involves taking a blood sample to check your fasting blood sugar  level. This should be done once every 3 years, after age 99, if you are within normal weight and without risk factors for diabetes. Testing should be considered at a younger age or be carried out more frequently if you are overweight and have at least 1 risk factor for diabetes.   Colorectal cancer can be detected and often prevented. Most routine colorectal cancer screening begins at the age of 48 and continues through age 63. However, your caregiver may recommend screening at an earlier  age if you have risk factors for colon cancer. On a yearly basis, your caregiver may provide home test kits to check for hidden blood in the stool. Use of a small camera at the end of a tube, to directly examine the colon (sigmoidoscopy or colonoscopy), can detect the earliest forms of colorectal cancer. Talk to your caregiver about this at age 87, when routine screening begins. Direct examination of the colon should be repeated every 5 to 10 years through age 14, unless early forms of pre-cancerous polyps or small growths are found.   Hepatitis C blood testing is recommended for all people born from [birth year] through 1963 and any individual with known risks for hepatitis C.   Healthy men should no longer receive prostate-specific antigen (PSA) blood tests as part of routine cancer screening. Consult with your caregiver about prostate cancer screening.   Testicular cancer screening is not recommended for adolescents or adult males who have no symptoms. Screening includes self-exam, caregiver exam, and other screening tests. Consult with your caregiver about any symptoms you have or any concerns you have about testicular cancer.   Practice safe sex. Use condoms and avoid high-risk sexual practices to reduce the spread of sexually transmitted infections (STIs).   Use sunscreen with a sun protection factor (SPF) of 30 or greater. Apply sunscreen liberally and repeatedly throughout the day. You should seek shade when your shadow is shorter than you. Protect yourself by wearing long sleeves, pants, a wide-brimmed hat, and sunglasses year round, whenever you are outdoors.   Notify your caregiver of new moles or changes in moles, especially if there is a change in shape or color. Also notify your caregiver if a mole is larger than the size of a pencil eraser.   A one-time screening for abdominal aortic aneurysm (AAA) and surgical repair of large AAAs by sound wave imaging (ultrasonography) is recommended for ages  66 to 10 years who are current or former smokers.   Stay current with your immunizations.  Document Released: 07/02/2007 Document Revised: 03/28/2011 Document Reviewed: 05/31/2010 [facility] Patient Information 2013 [place], [place].

## 2012-01-04 NOTE — Assessment & Plan Note (Signed)
On statin, low dose due CAD - ?mild myalgias Ok for 1 week drug holiday - follow up with cards on same Check lipids and adjust as needed, ?alternate statin if needed

## 2012-01-04 NOTE — Progress Notes (Signed)
Subjective:    Patient ID: Joseph Booker, male    DOB: December 12, 1950, 61 y.o.   MRN: [id number]  HPI  patient is here today for annual physical. Patient feels well overall.  also review of other medical issues:   CAD -stent to LAD 2004 -  follows annually with cardiologist [name]),  no chest pain or anginal symptoms - 100% compliant with meds   UC - no flares since 2009 - 100% compliant with meds as rx'd by GI - no adverse side effects on current tx   osteoporosis - no bone pains - related to prior steroid use - not on fosamax for years and does not take Ca or vit d regularly   dyslipidemia - complains of myalgia, hx same on atorva remotely, now on simva due to CAD at urging of cards. reports compliance with ongoing medical treatment and no changes in medication dose or frequency.     Past Medical History  Diagnosis Date  . Osteopenia   . Esophagitis   . CAD (coronary artery disease)     [name] / nuclear 2005, excellent tolerance no scar or ischemia, EF 51% / nuclear, February, 2012, no scar or ischemia / catheterization March 26, 2010.. 10% in-stent restenosis, minimal other nonobstructive coronary disease, ejection fraction 55%., excellent result; ETT 9/13: ex 13:14, no CP, no ischemic ECG changes  . INGUINAL HERNIA   . OSTEOPENIA   . VITAMIN D DEFICIENCY   . ALLERGIC RHINITIS   . ANEMIA, IRON DEFICIENCY   . Anxiety state, unspecified   . ULCERATIVE COLITIS-UNIVERSAL   . Hyperlipidemia   . GERD (gastroesophageal reflux disease)    Family History  Problem Relation Age of Onset  . Diabetes Mother   . Heart disease Mother   . Diabetes Father   . Heart disease Father 79    MI   History  Substance Use Topics  . Smoking status: Never Smoker   . Smokeless tobacco: Never Used     Comment: Married, 1 stepson, 5 g-kids. Retired Buyer, retail, now Systems developer  . Alcohol Use: 0.6 oz/week    1 Glasses of wine per week     Comment: weekly   Review of Systems   Constitutional: Negative for fever or weight change. complains of fatigue Respiratory: Negative for cough and shortness of breath.   Cardiovascular: Negative for chest pain or palpitations.  Gastrointestinal: Negative for abdominal pain, no bowel changes.  Musculoskeletal: Negative for gait problem or joint swelling.  Skin: Negative for rash.  Neurological: Negative for dizziness or headache.  No other specific complaints in a complete review of systems (except as listed in HPI above).     Objective:   Physical Exam  BP 130/72  Pulse 65  Temp 98.7 F (37.1 C) (Oral)  Ht 5' 8.5" (1.74 m)  Wt 162 lb 6.4 oz (73.664 kg)  BMI 24.33 kg/m2  SpO2 97% Wt Readings from Last 3 Encounters:  01/04/12 162 lb 6.4 oz (73.664 kg)  07/06/11 156 lb (70.761 kg)  06/15/11 156 lb (70.761 kg)   Constitutional:  He appears well-developed and well-nourished. No distress.  Neck: Normal range of motion. Neck supple. No JVD present. No thyromegaly present.  Cardiovascular: Normal rate, regular rhythm and normal heart sounds.  No murmur heard. no BLE edema Pulmonary/Chest: Effort normal and breath sounds normal. No respiratory distress. no wheezes.  Abdominal: Soft. Bowel sounds are normal. Patient exhibits no distension. There is no tenderness.  GU: defer today Skin: Skin is  warm and dry.  No erythema or ulceration.  Psychiatric: he has a normal mood and affect. behavior is normal. Judgment and thought content normal.   Lab Results  Component Value Date   WBC 3.5* 06/16/2011   HGB 13.7 06/16/2011   HCT 40.7 06/16/2011   PLT 173.0 06/16/2011   GLUCOSE 86 06/16/2011   CHOL 153 06/16/2011   TRIG 98.0 06/16/2011   HDL 49.80 06/16/2011   LDLCALC 84 06/16/2011   ALT 40 06/16/2011   AST 37 06/16/2011   NA 141 06/16/2011   K 3.8 06/16/2011   CL 108 06/16/2011   CREATININE 0.9 06/16/2011   BUN 16 06/16/2011   CO2 25 06/16/2011   TSH 0.73 06/16/2011   PSA 2.59 05/18/2009   INR 1.4* 03/22/2010        Assessment &  Plan:   Patient has been counseled on age-appropriate routine health concerns for screening and prevention. These are reviewed and up-to-date. Immunizations are up-to-date or declined. Labs ordered/reviewed  Fatigue - nonspecific symptoms/exam - check screening labs  Also See problem list. Medications and labs reviewed today.

## 2012-01-19 ENCOUNTER — Other Ambulatory Visit: Payer: Self-pay | Admitting: Gastroenterology

## 2012-01-19 MED ORDER — MESALAMINE ER 500 MG PO CPCR: 500.0000 mg | ORAL_CAPSULE | Freq: Four times a day (QID) | ORAL | Status: DC

## 2012-01-19 NOTE — Telephone Encounter (Signed)
Called pt to inform medication sent

## 2012-01-20 ENCOUNTER — Other Ambulatory Visit: Payer: Self-pay | Admitting: Gastroenterology

## 2012-01-20 NOTE — Telephone Encounter (Signed)
[facility]. No other information was needed per Pharmacy  Called pt to inform

## 2012-01-26 ENCOUNTER — Telehealth: Payer: Self-pay | Admitting: Gastroenterology

## 2012-01-27 NOTE — Telephone Encounter (Signed)
Waited on hold for 20 minutes will try and call back

## 2012-01-30 ENCOUNTER — Other Ambulatory Visit: Payer: Self-pay

## 2012-01-30 MED ORDER — MESALAMINE ER 500 MG PO CPCR: 1000.0000 mg | ORAL_CAPSULE | Freq: Four times a day (QID) | ORAL | Status: DC

## 2012-02-01 ENCOUNTER — Encounter: Payer: Self-pay | Admitting: Gastroenterology

## 2012-02-01 NOTE — Telephone Encounter (Signed)
Called pharmacy at [facility] called in new script for Pentasa BID and cancelled the old one.

## 2012-02-09 ENCOUNTER — Encounter: Payer: Self-pay | Admitting: Cardiology

## 2012-02-09 ENCOUNTER — Ambulatory Visit (INDEPENDENT_AMBULATORY_CARE_PROVIDER_SITE_OTHER): Payer: Managed Care, Other (non HMO) | Admitting: Cardiology

## 2012-02-09 VITALS — BP 118/76 | HR 66 | Ht 68.5 in | Wt 157.8 lb

## 2012-02-09 DIAGNOSIS — E785 Hyperlipidemia, unspecified: Secondary | ICD-10-CM

## 2012-02-09 DIAGNOSIS — M79609 Pain in unspecified limb: Secondary | ICD-10-CM

## 2012-02-09 DIAGNOSIS — M79603 Pain in arm, unspecified: Secondary | ICD-10-CM | POA: Insufficient documentation

## 2012-02-09 DIAGNOSIS — I2510 Atherosclerotic heart disease of native coronary artery without angina pectoris: Secondary | ICD-10-CM

## 2012-02-09 DIAGNOSIS — IMO0001 Reserved for inherently not codable concepts without codable children: Secondary | ICD-10-CM

## 2012-02-09 DIAGNOSIS — M791 Myalgia, unspecified site: Secondary | ICD-10-CM

## 2012-02-09 NOTE — Patient Instructions (Addendum)
Your physician wants you to follow-up in: 6 months.   You will receive a reminder letter in the mail two months in advance. If you don't receive a letter, please call our office to schedule the follow-up appointment.  Your physician has recommended you make the following change in your medication: HOLD your Simvastatin for 4 weeks, then call Debby Modene Andy, Dr Henrietta Hoover nurse, at [phone] and let us know how your arm pain is doing.  Your physician recommends that you return for lab work in: today (cpk)

## 2012-02-09 NOTE — Progress Notes (Signed)
HPI   Patient is seen today to followup coronary disease. He is also seen with a new complaint of discomfort in his right arm. He seems to be having difficulties when using his right arm. He has discomfort in the right upper arm but there is some radiation down the arm. Etiology is not clear. He held his statin for 8 days without improvement. He has vague chest pain but this is unchanged from the past.  No Known Allergies  Current Outpatient Prescriptions  Medication Sig Dispense Refill  . aspirin 81 MG tablet Take 81 mg by mouth daily.        Marland Kitchen azaTHIOprine (IMURAN) 50 MG tablet TAKE THREE AND ONE-HALF TABLETS DAILY  315 tablet  3  . B Complex Vitamins (VITAMIN B COMPLEX PO) 1 po daily       . calcium-vitamin D (OSCAL WITH D) 500-200 MG-UNIT per tablet Take 1 tablet by mouth daily.        . clopidogrel (PLAVIX) 75 MG tablet Take 1 tablet (75 mg total) by mouth daily.  90 tablet  3  . Coenzyme Q10 (EQL COQ10) 300 MG CAPS 1 po daily       . escitalopram (LEXAPRO) 10 MG tablet Take 10 mg by mouth daily.        . mesalamine (PENTASA) 500 MG CR capsule Take 2 capsules (1,000 mg total) by mouth 4 (four) times daily.  720 capsule  3  . Multiple Vitamin (MULTIVITAMIN) capsule Take 1 capsule by mouth daily.        . niacin (NIASPAN) 500 MG CR tablet 500 mg. 2 po daily       . saw palmetto 160 MG capsule Take 160 mg by mouth 2 (two) times daily.      . simvastatin (ZOCOR) 10 MG tablet Take 1 tablet (10 mg total) by mouth at bedtime.  90 tablet  3    History   Social History  . Marital Status: Married    Spouse Name: N/A    Number of Children: 1  . Years of Education: N/A   Occupational History  . Systems developer    Social History Main Topics  . Smoking status: Never Smoker   . Smokeless tobacco: Never Used     Comment: Married, 1 stepson, 5 g-kids. Retired Buyer, retail, now Systems developer  . Alcohol Use: 0.6 oz/week    1 Glasses of wine per week     Comment: weekly  . Drug Use: No   . Sexually Active: Not on file   Other Topics Concern  . Not on file   Social History Narrative  . No narrative on file    Family History  Problem Relation Age of Onset  . Diabetes Mother   . Heart disease Mother   . Diabetes Father   . Heart disease Father 61    MI    Past Medical History  Diagnosis Date  . Osteopenia   . Esophagitis   . CAD (coronary artery disease)     [name] / nuclear 2005, excellent tolerance no scar or ischemia, EF 51% / nuclear, February, 2012, no scar or ischemia / catheterization March 26, 2010.. 10% in-stent restenosis, minimal other nonobstructive coronary disease, ejection fraction 55%., excellent result; ETT 9/13: ex 13:14, no CP, no ischemic ECG changes  . INGUINAL HERNIA   . OSTEOPENIA   . VITAMIN D DEFICIENCY   . ALLERGIC RHINITIS   . ANEMIA, IRON DEFICIENCY   . Anxiety  state, unspecified   . ULCERATIVE COLITIS-UNIVERSAL   . Hyperlipidemia   . GERD (gastroesophageal reflux disease)     Past Surgical History  Procedure Date  . Angioplasty     stent 2004  . Hernia repair     2006    Patient Active Problem List  Diagnosis  . VITAMIN D DEFICIENCY  . DYSLIPIDEMIA  . ANEMIA, IRON DEFICIENCY  . ANXIETY STATE, UNSPECIFIED  . ALLERGIC RHINITIS  . GERD  . ULCERATIVE COLITIS-UNIVERSAL  . OSTEOPENIA  . CAD (coronary artery disease)  . BPH (benign prostatic hypertrophy)    ROS   Patient denies fever, chills, headache, sweats, rash, change in vision, change in hearing, cough, nausea vomiting, urinary symptoms. All other systems are reviewed and are negative.  PHYSICAL EXAM  Patient is oriented to person time and place. Affect is normal. There is no jugulovenous distention. Lungs are clear. Respiratory effort is nonlabored. Cardiac exam reveals S1 and S2. There no clicks or significant murmurs. The abdomen is soft. Is no peripheral edema. There is no obvious functional abnormality with his right arm.  Filed Vitals:    02/09/12 1620  BP: 118/76  Pulse: 66  Height: 5' 8.5" (1.74 m)  Weight: 157 lb 12.8 oz (71.578 kg)    6  ASSESSMENT & PLAN

## 2012-02-09 NOTE — Assessment & Plan Note (Signed)
On long term the patient needs to remain on his statin. However we will hold it for 3-4 weeks to give a complete trial relative to the pain in his arm. Also CPK will be checked.

## 2012-02-09 NOTE — Assessment & Plan Note (Signed)
Coronary disease is stable. He has normal left jugular function. Catheterization in March, 2012 revealed 10% in-stent restenosis and minimal other disease. No further workup at this time.

## 2012-02-09 NOTE — Assessment & Plan Note (Signed)
Etiology of the arm pain is not clear. We will keep him off his statin for several weeks and check a CPK. If the CPK is normal and there is no obvious improvement he'll talk to him about further referral.

## 2012-02-10 LAB — CK: Total CK: 164 U/L (ref 7–232)

## 2012-02-17 ENCOUNTER — Telehealth: Payer: Self-pay | Admitting: Cardiology

## 2012-02-17 NOTE — Telephone Encounter (Signed)
**Note De-Identified  Obfuscation** Pt advised of CPK results, he verbalized understanding.

## 2012-02-17 NOTE — Telephone Encounter (Signed)
New problem:    Aware that nurse is off today   Returning call back to nurse

## 2012-04-13 ENCOUNTER — Ambulatory Visit (INDEPENDENT_AMBULATORY_CARE_PROVIDER_SITE_OTHER): Payer: Managed Care, Other (non HMO) | Admitting: Gastroenterology

## 2012-04-13 ENCOUNTER — Encounter: Payer: Self-pay | Admitting: Gastroenterology

## 2012-04-13 ENCOUNTER — Telehealth: Payer: Self-pay | Admitting: *Deleted

## 2012-04-13 VITALS — BP 118/80 | HR 57 | Ht 69.0 in | Wt 163.0 lb

## 2012-04-13 DIAGNOSIS — K5190 Ulcerative colitis, unspecified, without complications: Secondary | ICD-10-CM

## 2012-04-13 MED ORDER — NA SULFATE-K SULFATE-MG SULF 17.5-3.13-1.6 GM/177ML PO SOLN: 1.0000 | Freq: Once | ORAL | Status: DC

## 2012-04-13 MED ORDER — SOD PHOS MONO-SOD PHOS DIBASIC 1.102-0.398 G PO TABS: 1.0000 | ORAL_TABLET | Freq: Once | ORAL | Status: DC

## 2012-04-13 NOTE — Telephone Encounter (Signed)
[facility] [address] [place] [address]   04/13/2012    RE: Joseph Booker DOB: [birth year] MRN: [id number]   Dear  [name],   We have scheduled the above patient for an endoscopic procedure. Our records show that he is on anticoagulation therapy.   Please advise as to how long the patient may come off his therapy of plavix prior to the procedure, which is scheduled for 05/17/2012.  Please fax back/ or route the completed form to [name] at [phone].   Sincerely,  Merri Ray

## 2012-04-13 NOTE — Progress Notes (Signed)
History of Present Illness: Pleasant 62 year old white male with history of ulcerative colitis on Imuran and Pentasa here for an annual visit. He occasionally has mild urgency and loose stools. He denies rectal bleeding. Patient is on Plavix because of coronary artery disease. Last colonoscopy was one year ago.    Past Medical History  Diagnosis Date  . Osteopenia   . Esophagitis   . CAD (coronary artery disease)     [name] / nuclear 2005, excellent tolerance no scar or ischemia, EF 51% / nuclear, February, 2012, no scar or ischemia / catheterization March 26, 2010.. 10% in-stent restenosis, minimal other nonobstructive coronary disease, ejection fraction 55%., excellent result; ETT 9/13: ex 13:14, no CP, no ischemic ECG changes  . INGUINAL HERNIA   . OSTEOPENIA   . VITAMIN D DEFICIENCY   . ALLERGIC RHINITIS   . ANEMIA, IRON DEFICIENCY   . Anxiety state, unspecified   . ULCERATIVE COLITIS-UNIVERSAL   . Hyperlipidemia   . GERD (gastroesophageal reflux disease)   . Arm pain     Right arm discomfort with some radiation from the biceps down to the wrist..  January, 2014   Past Surgical History  Procedure Laterality Date  . Angioplasty      stent 2004  . Hernia repair      2006   family history includes Diabetes in his father and mother; Heart disease in his mother; and Heart disease (age of onset: 75) in his father. Current Outpatient Prescriptions  Medication Sig Dispense Refill  . aspirin 81 MG tablet Take 81 mg by mouth daily.        Marland Kitchen azaTHIOprine (IMURAN) 50 MG tablet TAKE THREE AND ONE-HALF TABLETS DAILY  315 tablet  3  . B Complex Vitamins (VITAMIN B COMPLEX PO) 1 po daily       . calcium-vitamin D (OSCAL WITH D) 500-200 MG-UNIT per tablet Take 1 tablet by mouth daily.        . clopidogrel (PLAVIX) 75 MG tablet Take 1 tablet (75 mg total) by mouth daily.  90 tablet  3  . Coenzyme Q10 (EQL COQ10) 300 MG CAPS 1 po daily       . escitalopram (LEXAPRO) 10 MG tablet  Take 10 mg by mouth daily.        . mesalamine (PENTASA) 500 MG CR capsule Take 2 capsules (1,000 mg total) by mouth 4 (four) times daily.  720 capsule  3  . Multiple Vitamin (MULTIVITAMIN) capsule Take 1 capsule by mouth daily.        . niacin (NIASPAN) 500 MG CR tablet 500 mg. 2 po daily       . saw palmetto 160 MG capsule Take 160 mg by mouth 2 (two) times daily.      . simvastatin (ZOCOR) 10 MG tablet Take 1 tablet (10 mg total) by mouth at bedtime.  90 tablet  3   No current facility-administered medications for this visit.   Allergies as of 04/13/2012  . (No Known Allergies)    reports that he has never smoked. He has never used smokeless tobacco. He reports that he drinks about 0.6 ounces of alcohol per week. He reports that he does not use illicit drugs.     Review of Systems: Pertinent positive and negative review of systems were noted in the above HPI section. All other review of systems were otherwise negative.  Vital signs were reviewed in today's medical record Physical Exam: General: Well developed , well nourished, no  acute distress Skin: anicteric Head: Normocephalic and atraumatic Eyes:  sclerae anicteric, EOMI Ears: Normal auditory acuity Mouth: No deformity or lesions Neck: Supple, no masses or thyromegaly Lungs: Clear throughout to auscultation Heart: Regular rate and rhythm; no murmurs, rubs or bruits Abdomen: Soft, non tender and non distended. No masses, hepatosplenomegaly or hernias noted. Normal Bowel sounds Rectal:deferred Musculoskeletal: Symmetrical with no gross deformities  Skin: No lesions on visible extremities Pulses:  Normal pulses noted Extremities: No clubbing, cyanosis, edema or deformities noted Neurological: Alert oriented x 4, grossly nonfocal Cervical Nodes:  No significant cervical adenopathy Inguinal Nodes: No significant inguinal adenopathy Psychological:  Alert and cooperative. Normal mood and affect

## 2012-04-13 NOTE — Patient Instructions (Addendum)
We are excited to introduce MyChart, a new best-in-class service that provides you online access to important information in your electronic medical record. We want to make it easier for you to view your health information - all in one secure location - when and where you need it. We expect MyChart will enhance the quality of care and service we provide.  When you register for MyChart, you can:    View your test results.    Request appointments and receive appointment reminders via email.    Request medication renewals.    View your medical history, allergies, medications and immunizations.    Communicate with your physician's office through a password-protected site.    Conveniently print information such as your medication lists.  To find out if MyChart is right for you, please talk to a member of our clinical staff today. We will gladly answer your questions about this free health and wellness tool.  If you are age 60 or older and want a member of your family to have access to your record, you must provide written consent by completing a proxy form available at our office. Please speak to our clinical staff about guidelines regarding accounts for patients younger than age 6.  As you activate your MyChart account and need any technical assistance, please call the MyChart technical support line at [phone]) or email your question to [email or web address]. If you email your question(s), please include your name, a return phone number and the best time to reach you.  If you have non-urgent health-related questions, you can send a message to our office through MyChart at [email or web address]. If you have a medical emergency, call 911.  Thank you for using MyChart as your new health and wellness resource!   MyChart licensed from [employer],  [id number]. Patents Pending.   You have been scheduled for a  colonoscopy with propofol. Please follow written instructions given to you at your visit today.  Please pick up your prep kit at the pharmacy within the next 1-3 days. If you use inhalers (even only as needed), please bring them with you on the day of your procedure.  You will be contacted by our office prior to your procedure for directions on holding your Plavix.  If you do not hear from our office 1 week prior to your scheduled procedure, please call [phone] to discuss.    April 13, 2012 MRN: [id number] [facility]  [facility]  OSP BASED PRODUCT CONSENT FORM  I understand that in order to prepare myself for an examination of my large bowel during a colonoscpoy I must undergo a bowel cleansing prior to the procedure. This is to ensure that my bowel is as clean as possible so the physician performing the procedure can thoroughly examine the lining of my colon to detect polyps or other abnormalities.  I have beeen informed that the FDA has become aware of reports of acute phosphate nephropathy, a type of acute kidney injury, associated with the use of oral sodium phosphate based products (OPS) for bowel cleansing prior to colonoscopy and other procedures. These rare but serious events have occurred in some patients wothout identifible factors that would put them at risk for developing acute kidney injury. These products include the prescription products, OsmoPrep and Visicol, as well as previously available over-the-counter laxatives (e.g., Fleet Phospho-soda).  Prior caution should be taken in patients with a history of renal insufficiency, advanced age, or those taking diuretics or certain blood  pressure medications. The use of OSP is contraindicated in patients with advanced renal disease or a history of heart failure.  Alternative preparations for bowel cleansing recommended by my physician include: MoviPrep, Golytely, HalfLytely, Miralax, or other PEG (polyethylene glycol)  based products which, to date, have not been associated with these types od adverse events.  The above risks and alternatives have been fully expained to me and I am aware that by taking OsmoPrep, or like products, an acute kidney injury may occur without warning. Despite this warning, I have chosen to use a phosphate-based regime for bowel cleansing prior to my colonoscopy and will assume responsibility for any adverse effect that I may experience from this product. I also understand the importance of vigorous hydration before and immediately following my procedure.     Instructed by:________________________________     Signed:________________________________________   Date:_______________   Original 03/20/07   Was signed and scanned in epic..... rms

## 2012-04-13 NOTE — Assessment & Plan Note (Signed)
Plan to continue Imuran and Pentasa. We'll check CBC and comprehensive metabolic profile twice here. Continue yearly colonoscopy.

## 2012-04-16 ENCOUNTER — Encounter: Payer: Self-pay | Admitting: Gastroenterology

## 2012-04-16 NOTE — Telephone Encounter (Signed)
Okay to hold Plavix for GI procedure. 

## 2012-04-20 NOTE — Telephone Encounter (Signed)
Dr [name] do we hold 5 or 7 days

## 2012-04-24 NOTE — Telephone Encounter (Signed)
Ok to hold plavix before procedure L/M for pt that it is ok

## 2012-04-26 ENCOUNTER — Telehealth: Payer: Self-pay | Admitting: Gastroenterology

## 2012-04-26 NOTE — Telephone Encounter (Signed)
Lm on vm 

## 2012-04-26 NOTE — Telephone Encounter (Signed)
After reviewing Dr. Kelby Fam most recent office note and the colonoscopy instructions given the patient, I told him it appeared that the suprep had been ordered in error as everything pointed to him just using Osmoprep for his procedure.  Patient agreed.

## 2012-05-17 ENCOUNTER — Ambulatory Visit (AMBULATORY_SURGERY_CENTER): Payer: Managed Care, Other (non HMO) | Admitting: Gastroenterology

## 2012-05-17 ENCOUNTER — Encounter: Payer: Self-pay | Admitting: Gastroenterology

## 2012-05-17 VITALS — BP 114/51 | HR 46 | Temp 97.8°F | Resp 60 | Ht 69.0 in | Wt 163.0 lb

## 2012-05-17 DIAGNOSIS — K5289 Other specified noninfective gastroenteritis and colitis: Secondary | ICD-10-CM

## 2012-05-17 DIAGNOSIS — K5190 Ulcerative colitis, unspecified, without complications: Secondary | ICD-10-CM

## 2012-05-17 MED ORDER — SODIUM CHLORIDE 0.9 % IV SOLN: 500.0000 mL | INTRAVENOUS | Status: DC

## 2012-05-17 NOTE — Op Note (Signed)
[place]  [address] [place], [address]   COLONOSCOPY PROCEDURE REPORT  PATIENT: Joseph, Booker  MR#: [id number] BIRTHDATE: 1950-06-15 , 62  yrs. old GENDER: Male ENDOSCOPIST: Inda Castle, MD REFERRED BY: PROCEDURE DATE:  05/17/2012 PROCEDURE:   Colonoscopy with biopsy ASA CLASS:   Class II INDICATIONS: MEDICATIONS: MAC sedation, administered by CRNA and propofol (Diprivan) 368m IV  DESCRIPTION OF PROCEDURE:   After the risks benefits and alternatives of the procedure were thoroughly explained, informed consent was obtained.  A digital rectal exam revealed no abnormalities of the rectum.   The LB [id number] endoscope was introduced through the anus and advanced to the cecum, which was identified by both the appendix and ileocecal valve. No adverse events experienced.   The quality of the prep was excellent using Suprep  The instrument was then slowly withdrawn as the colon was fully examined.      COLON FINDINGS: The colon was irrigated with methylene-impregnated normal saline.  There were very few areas of increased uptake. These were selectively biopsied..  There was no gross erythema or other mucosal changes in the colon.  It was very slight mucosal atrophy in the rectal vault.   The colon was irrigated with methylene-impregnated normal saline.  There were very few areas of increased uptake.  These were selectively biopsied..  There was no gross erythema or other mucosal changes in the colon.  It was very slight mucosal atrophy in the rectal vault.  Retroflexed views revealed no abnormalities. The time to cecum=6 minutes 37 seconds. Withdrawal time=9 minutes 34 seconds.  The scope was withdrawn and the procedure completed. COMPLICATIONS: There were no complications.  ENDOSCOPIC IMPRESSION: 1.  ulcerative colitis-in remission  RECOMMENDATIONS: Await biopsy results   eSigned:  RInda Castle MD 05/17/2012 4:15 PM   cc:  VRowe Clack MD and AMarylynn PearsonMD

## 2012-05-17 NOTE — Progress Notes (Signed)
Patient did not experience any of the following events: a burn prior to discharge; a fall within the facility; wrong site/side/patient/procedure/implant event; or a hospital transfer or hospital admission upon discharge from the facility. (G8907) Patient did not have preoperative order for IV antibiotic SSI prophylaxis. (G8918)  

## 2012-05-17 NOTE — Patient Instructions (Addendum)
Discharge instructions given with verbal understanding. Biopsies taken. Resume previous medications. YOU HAD AN ENDOSCOPIC PROCEDURE TODAY AT THE [facility]: Refer to the procedure report that was given to you for any specific questions about what was found during the examination.  If the procedure report does not answer your questions, please call your gastroenterologist to clarify.  If you requested that your care partner not be given the details of your procedure findings, then the procedure report has been included in a sealed envelope for you to review at your convenience later.  YOU SHOULD EXPECT: Some feelings of bloating in the abdomen. Passage of more gas than usual.  Walking can help get rid of the air that was put into your GI tract during the procedure and reduce the bloating. If you had a lower endoscopy (such as a colonoscopy or flexible sigmoidoscopy) you may notice spotting of blood in your stool or on the toilet paper. If you underwent a bowel prep for your procedure, then you may not have a normal bowel movement for a few days.  DIET: Your first meal following the procedure should be a light meal and then it is ok to progress to your normal diet.  A half-sandwich or bowl of soup is an example of a good first meal.  Heavy or fried foods are harder to digest and may make you feel nauseous or bloated.  Likewise meals heavy in dairy and vegetables can cause extra gas to form and this can also increase the bloating.  Drink plenty of fluids but you should avoid alcoholic beverages for 24 hours.  ACTIVITY: Your care partner should take you home directly after the procedure.  You should plan to take it easy, moving slowly for the rest of the day.  You can resume normal activity the day after the procedure however you should NOT DRIVE or use heavy machinery for 24 hours (because of the sedation medicines used during the test).    SYMPTOMS TO REPORT IMMEDIATELY: A gastroenterologist  can be reached at any hour.  During normal business hours, 8:30 AM to 5:00 PM Monday through Friday, call [phone].  After hours and on weekends, please call the GI answering service at [phone] who will take a message and have the physician on call contact you.   Following lower endoscopy (colonoscopy or flexible sigmoidoscopy):  Excessive amounts of blood in the stool  Significant tenderness or worsening of abdominal pains  Swelling of the abdomen that is new, acute  Fever of 100F or higher  FOLLOW UP: If any biopsies were taken you will be contacted by phone or by letter within the next 1-3 weeks.  Call your gastroenterologist if you have not heard about the biopsies in 3 weeks.  Our staff will call the home number listed on your records the next business day following your procedure to check on you and address any questions or concerns that you may have at that time regarding the information given to you following your procedure. This is a courtesy call and so if there is no answer at the home number and we have not heard from you through the emergency physician on call, we will assume that you have returned to your regular daily activities without incident.  SIGNATURES/CONFIDENTIALITY: You and/or your care partner have signed paperwork which will be entered into your electronic medical record.  These signatures attest to the fact that that the information above on your After Visit Summary has been reviewed   and is understood.  Full responsibility of the confidentiality of this discharge information lies with you and/or your care-partner.  

## 2012-05-17 NOTE — Progress Notes (Signed)
Stable to RR 

## 2012-05-18 ENCOUNTER — Telehealth: Payer: Self-pay | Admitting: *Deleted

## 2012-05-18 NOTE — Telephone Encounter (Signed)
  Follow up Call-  Call back number 05/17/2012 03/02/2011  Post procedure Call Back phone  # [phone]  Permission to leave phone message Yes Yes    LMOM

## 2012-05-25 ENCOUNTER — Encounter: Payer: Self-pay | Admitting: Gastroenterology

## 2012-06-22 ENCOUNTER — Other Ambulatory Visit: Payer: Self-pay | Admitting: Gastroenterology

## 2012-06-26 ENCOUNTER — Other Ambulatory Visit: Payer: Self-pay

## 2012-06-26 NOTE — Telephone Encounter (Signed)
02/09/2012 4:00 PM Office Visit Simvastatin (ZOCOR) 10 MG tablet  Take 1 tablet (10 mg total) by mouth at bedtime.   90 tablet   3   Your physician has recommended you make the following change in your medication: HOLD your Simvastatin for 4 weeks, then call Debby Lefler, Dr Henrietta Hoover nurse, at [phone] and let us know how your arm pain is doing. Your physician recommends that you return for lab work in: today (cpk)

## 2012-06-27 MED ORDER — SIMVASTATIN 10 MG PO TABS: 10.0000 mg | ORAL_TABLET | Freq: Every day | ORAL | Status: DC

## 2012-06-28 ENCOUNTER — Other Ambulatory Visit: Payer: Self-pay | Admitting: Cardiology

## 2012-09-02 ENCOUNTER — Other Ambulatory Visit: Payer: Self-pay | Admitting: Cardiology

## 2012-11-21 ENCOUNTER — Ambulatory Visit (INDEPENDENT_AMBULATORY_CARE_PROVIDER_SITE_OTHER): Payer: Managed Care, Other (non HMO)

## 2012-11-21 DIAGNOSIS — Z23 Encounter for immunization: Secondary | ICD-10-CM

## 2013-01-24 ENCOUNTER — Other Ambulatory Visit: Payer: Self-pay | Admitting: Dermatology

## 2013-02-24 ENCOUNTER — Other Ambulatory Visit: Payer: Self-pay | Admitting: Gastroenterology

## 2013-03-01 ENCOUNTER — Other Ambulatory Visit: Payer: Self-pay | Admitting: Cardiology

## 2013-03-02 ENCOUNTER — Other Ambulatory Visit: Payer: Self-pay | Admitting: Cardiology

## 2013-03-06 ENCOUNTER — Other Ambulatory Visit: Payer: Self-pay | Admitting: Dermatology

## 2013-03-12 ENCOUNTER — Telehealth: Payer: Self-pay | Admitting: *Deleted

## 2013-03-12 DIAGNOSIS — Z0000 Encounter for general adult medical examination without abnormal findings: Secondary | ICD-10-CM

## 2013-03-12 DIAGNOSIS — I2510 Atherosclerotic heart disease of native coronary artery without angina pectoris: Secondary | ICD-10-CM

## 2013-03-12 NOTE — Telephone Encounter (Signed)
Patient phoned requesting for PCP to order a lipid panel in addition to any other routine labs necessary.  Last OV with PCP 01/04/12 (and last routine annual exam). Please advise.  CB# [phone]

## 2013-03-13 NOTE — Telephone Encounter (Signed)
Phoned and left voicemail message on patient's line with MD's orders.

## 2013-03-13 NOTE — Telephone Encounter (Signed)
Labs ordered.

## 2013-03-19 ENCOUNTER — Encounter: Payer: Self-pay | Admitting: Gastroenterology

## 2013-03-20 ENCOUNTER — Other Ambulatory Visit (INDEPENDENT_AMBULATORY_CARE_PROVIDER_SITE_OTHER): Payer: Managed Care, Other (non HMO)

## 2013-03-20 ENCOUNTER — Telehealth: Payer: Self-pay | Admitting: Cardiology

## 2013-03-20 DIAGNOSIS — I2510 Atherosclerotic heart disease of native coronary artery without angina pectoris: Secondary | ICD-10-CM

## 2013-03-20 DIAGNOSIS — Z0000 Encounter for general adult medical examination without abnormal findings: Secondary | ICD-10-CM

## 2013-03-20 LAB — URINALYSIS, ROUTINE W REFLEX MICROSCOPIC
Bilirubin Urine: NEGATIVE
Ketones, ur: NEGATIVE
LEUKOCYTES UA: NEGATIVE
NITRITE: NEGATIVE
Specific Gravity, Urine: >=1.03 — AB (ref 1.000–1.030)
Total Protein, Urine: NEGATIVE
UROBILINOGEN UA: 0.2 (ref 0.0–1.0)
Urine Glucose: NEGATIVE
pH: 5.5 (ref 5.0–8.0)

## 2013-03-20 LAB — TSH: TSH: 1.3 u[IU]/mL (ref 0.35–5.50)

## 2013-03-20 LAB — BASIC METABOLIC PANEL
BUN: 18 mg/dL (ref 6–23)
CALCIUM: 9 mg/dL (ref 8.4–10.5)
CHLORIDE: 107 meq/L (ref 96–112)
CO2: 27 meq/L (ref 19–32)
Creatinine, Ser: 1 mg/dL (ref 0.4–1.5)
GFR: 82.66 mL/min (ref >60.00)
GLUCOSE: 92 mg/dL (ref 70–99)
Potassium: 3.8 mEq/L (ref 3.5–5.1)
Sodium: 140 mEq/L (ref 135–145)

## 2013-03-20 LAB — CBC WITH DIFFERENTIAL/PLATELET
BASOS ABS: 0 10*3/uL (ref 0.0–0.1)
Basophils Relative: 0.8 % (ref 0.0–3.0)
Eosinophils Absolute: 0.2 10*3/uL (ref 0.0–0.7)
Eosinophils Relative: 3.5 % (ref 0.0–5.0)
HCT: 44.6 % (ref 39.0–52.0)
HEMOGLOBIN: 14.9 g/dL (ref 13.0–17.0)
LYMPHS PCT: 15.6 % (ref 12.0–46.0)
Lymphs Abs: 0.8 10*3/uL (ref 0.7–4.0)
MCHC: 33.5 g/dL (ref 30.0–36.0)
MCV: 110.7 fl — ABNORMAL HIGH (ref 78.0–100.0)
Monocytes Absolute: 0.3 10*3/uL (ref 0.1–1.0)
Monocytes Relative: 6 % (ref 3.0–12.0)
NEUTROS ABS: 3.8 10*3/uL (ref 1.4–7.7)
Neutrophils Relative %: 74.1 % (ref 43.0–77.0)
Platelets: 189 10*3/uL (ref 150.0–400.0)
RBC: 4.03 Mil/uL — ABNORMAL LOW (ref 4.22–5.81)
RDW: 15.8 % — AB (ref 11.5–14.6)
WBC: 5.2 10*3/uL (ref 4.5–10.5)

## 2013-03-20 LAB — HEPATIC FUNCTION PANEL
ALT: 34 U/L (ref 0–53)
AST: 34 U/L (ref 0–37)
Albumin: 4.3 g/dL (ref 3.5–5.2)
Alkaline Phosphatase: 48 U/L (ref 39–117)
Bilirubin, Direct: 0.1 mg/dL (ref 0.0–0.3)
Total Bilirubin: 1.3 mg/dL — ABNORMAL HIGH (ref 0.3–1.2)
Total Protein: 7.3 g/dL (ref 6.0–8.3)

## 2013-03-20 LAB — LIPID PANEL
Cholesterol: 141 mg/dL (ref 0–200)
HDL: 56.8 mg/dL (ref >39.00)
LDL CALC: 79 mg/dL (ref 0–99)
Total CHOL/HDL Ratio: 2
Triglycerides: 24 mg/dL (ref 0.0–149.0)
VLDL: 4.8 mg/dL (ref 0.0–40.0)

## 2013-03-20 NOTE — Telephone Encounter (Signed)
Walk in pt Form " sealed Envelope" Dropped Off gave to Wonda Horner

## 2013-03-22 ENCOUNTER — Other Ambulatory Visit: Payer: Self-pay

## 2013-03-22 ENCOUNTER — Telehealth: Payer: Self-pay

## 2013-03-22 DIAGNOSIS — I2510 Atherosclerotic heart disease of native coronary artery without angina pectoris: Secondary | ICD-10-CM

## 2013-03-22 NOTE — Telephone Encounter (Signed)
Patient called no answer.Left message to call me Monday 03/25/13 regarding letter you sent to Dr.Katz.

## 2013-03-25 ENCOUNTER — Telehealth: Payer: Self-pay | Admitting: Cardiology

## 2013-03-25 NOTE — Telephone Encounter (Signed)
Patient came by office.Dr.Katz received letter your mailed him.Treadmill scheduled with Dr.Katz Monday 04/01/13 at 1:00 pm.

## 2013-03-25 NOTE — Telephone Encounter (Signed)
Returned call to patient no answer.LMTC. 

## 2013-03-25 NOTE — Telephone Encounter (Signed)
New message  Patient is returning your call from Friday, please call back.

## 2013-04-01 ENCOUNTER — Encounter: Payer: Self-pay | Admitting: Cardiology

## 2013-04-01 ENCOUNTER — Ambulatory Visit (INDEPENDENT_AMBULATORY_CARE_PROVIDER_SITE_OTHER): Payer: Managed Care, Other (non HMO) | Admitting: Cardiology

## 2013-04-01 VITALS — BP 130/66 | HR 60 | Ht 68.0 in | Wt 158.0 lb

## 2013-04-01 DIAGNOSIS — I2510 Atherosclerotic heart disease of native coronary artery without angina pectoris: Secondary | ICD-10-CM

## 2013-04-01 DIAGNOSIS — E785 Hyperlipidemia, unspecified: Secondary | ICD-10-CM

## 2013-04-01 NOTE — Assessment & Plan Note (Signed)
The patient received a stent to his LAD in 2004. He had repeat catheterization in March, 2012. There was 10% in-stent restenosis. There was mild other coronary disease. He has some chest pain at this time. The treadmill has been done today. He exercised well. There was no definite EKG change. There was one ventricular couplet with peak stress. There was a mild hypertensive response. There were be no change in his therapy at this time.  I have outlined the patient's symptoms. Medications are listed in this note. Physical examination is listed. Diagnoses are listed. The patient's overall prognosis at this time is good. He has known coronary disease but he is stable at this time. Recommended followup will be in one year.

## 2013-04-01 NOTE — Assessment & Plan Note (Signed)
The patient is on a low dose of a statin. He does not tolerate higher dosing. No change in therapy at this time.

## 2013-04-01 NOTE — Progress Notes (Signed)
Exercise Treadmill Test  Pre-Exercise Testing Evaluation Rhythm: sinus bradycardia Rate: 52    Test  Exercise Tolerance Test Ordering MD: Dola Argyle, MD  Interpreting MD: Dola Argyle, MD  Unique Test No: 1  Treadmill:  1  Indication for ETT: known ASHD  Contraindication to ETT: No   Stress Modality: exercise - treadmill  Cardiac Imaging Performed: non   Protocol: standard Bruce - maximal  Max BP:  205/96  Max MPHR (bpm):  160 85% MPR (bpm):  136   MPHR obtained (bpm):  153 % MPHR obtained:  96  Reached 85% MPHR (min:sec):  12:06 Total Exercise Time (min-sec):  13:39  Workload in METS: 16.5 Borg Scale: 18  Reason ETT Terminated:  fatigue    ST Segment Analysis At Rest: normal ST segments - no evidence of significant ST depression With Exercise: no evidence of significant ST depression  Other Information Arrhythmia:  Yes Angina during ETT:  absent (0) Quality of ETT:  diagnostic  ETT Interpretation:  normal - no evidence of ischemia by ST analysis  Comments:   The patient exercise well on the treadmill. This was a maximum effort for him. The study was limited by fatigue. There were no diagnostic EKG changes. There were scattered PVCs. The patient did have 2 ventricular couplets there were seen at peak stress. There was a mild hypertensive blood pressure response.  Recommendations:   No further cardiac workup is recommended at this time.  Dola Argyle, MD

## 2013-04-01 NOTE — Progress Notes (Signed)
HPI  The patient is seen today to followup his coronary disease. He does have some chest discomfort. He is able to go about good activities. He is able to tolerate a low dose of a statin.  No Known Allergies  Current Outpatient Prescriptions  Medication Sig Dispense Refill  . aspirin 81 MG tablet Take 81 mg by mouth daily.        Marland Kitchen azaTHIOprine (IMURAN) 50 MG tablet TAKE THREE AND ONE-HALF TABLETS DAILY  315 tablet  1  . clopidogrel (PLAVIX) 75 MG tablet TAKE 1 TABLET DAILY  90 tablet  2  . Coenzyme Q10 (EQL COQ10) 300 MG CAPS 1 po daily       . mesalamine (PENTASA) 500 MG CR capsule Take 2 capsules (1,000 mg total) by mouth 4 (four) times daily.  720 capsule  3  . niacin (NIASPAN) 500 MG CR tablet 500 mg. 2 po daily       . saw palmetto 160 MG capsule Take 160 mg by mouth 2 (two) times daily.      . simvastatin (ZOCOR) 10 MG tablet TAKE 1 TABLET AT BEDTIME  90 tablet  0   No current facility-administered medications for this visit.    History   Social History  . Marital Status: Married    Spouse Name: N/A    Number of Children: 1  . Years of Education: N/A   Occupational History  . Herbalist    Social History Main Topics  . Smoking status: Never Smoker   . Smokeless tobacco: Never Used     Comment: Married, 1 stepson, 5 g-kids. Retired Emergency planning/management officer, now Herbalist  . Alcohol Use: 0.6 oz/week    1 Glasses of wine per week     Comment: weekly  . Drug Use: No  . Sexual Activity: Not on file   Other Topics Concern  . Not on file   Social History Narrative  . No narrative on file    Family History  Problem Relation Age of Onset  . Diabetes Mother   . Heart disease Mother   . Diabetes Father   . Heart disease Father 34    MI    Past Medical History  Diagnosis Date  . Osteopenia   . Esophagitis   . CAD (coronary artery disease)     [name] / nuclear 2005, excellent tolerance no scar or ischemia, EF 51% / nuclear, February, 2012,  no scar or ischemia / catheterization March 26, 2010.. 10% in-stent restenosis, minimal other nonobstructive coronary disease, ejection fraction 55%., excellent result; ETT 9/13: ex 13:14, no CP, no ischemic ECG changes  . INGUINAL HERNIA   . OSTEOPENIA   . VITAMIN D DEFICIENCY   . ALLERGIC RHINITIS   . ANEMIA, IRON DEFICIENCY   . Anxiety state, unspecified   . ULCERATIVE COLITIS-UNIVERSAL   . Hyperlipidemia   . GERD (gastroesophageal reflux disease)   . Arm pain     Right arm discomfort with some radiation from the biceps down to the wrist..  January, 2014    Past Surgical History  Procedure Laterality Date  . Angioplasty      stent 2004  . Hernia repair      2006    Patient Active Problem List   Diagnosis Date Noted  . Arm pain   . BPH (benign prostatic hypertrophy) 07/06/2011  . CAD (coronary artery disease)   . DYSLIPIDEMIA 06/12/2009  . VITAMIN D DEFICIENCY 06/04/2009  . ANEMIA,  IRON DEFICIENCY 06/04/2009  . ALLERGIC RHINITIS 06/04/2009  . GERD 06/04/2009  . ANXIETY STATE, UNSPECIFIED 05/15/2009  . OSTEOPENIA 05/15/2009  . ULCERATIVE COLITIS-UNIVERSAL 05/17/2007    ROS   Patient denies fever, chills, headache, sweats, rash, change in vision, change in hearing, cough, nausea vomiting, urinary symptoms. All other systems are reviewed and are negative.  PHYSICAL EXAM  Patient is oriented to person time and place. Affect is normal. There is no jugulovenous distention. Lungs are clear. Respiratory effort is not labored. Cardiac exam reveals S1 and S2. There no clicks or significant murmurs. The abdomen is soft. There is no peripheral edema. There no musculoskeletal deformities. There are no skin rashes.  Filed Vitals:   04/01/13 1412  BP: 130/66  Pulse: 60  Height: 5\' 8"  (1.727 m)  Weight: 158 lb (71.668 kg)   Resting EKG reveals no diagnostic abnormalities. A full standard treadmill test was done today. The report is enclosed.  ASSESSMENT & PLAN

## 2013-04-01 NOTE — Patient Instructions (Signed)
**Note De-identified  Obfuscation** Your physician recommends that you continue on your current medications as directed. Please refer to the Current Medication list given to you today.  Your physician wants you to follow-up in: 1 year. You will receive a reminder letter in the mail two months in advance. If you don't receive a letter, please call our office to schedule the follow-up appointment.  

## 2013-04-10 ENCOUNTER — Ambulatory Visit: Payer: Managed Care, Other (non HMO) | Admitting: Cardiology

## 2013-04-17 ENCOUNTER — Ambulatory Visit (INDEPENDENT_AMBULATORY_CARE_PROVIDER_SITE_OTHER)
Admission: RE | Admit: 2013-04-17 | Discharge: 2013-04-17 | Disposition: A | Discharge status: Home / Self Care | Payer: Managed Care, Other (non HMO) | Source: Ambulatory Visit | Attending: Internal Medicine | Admitting: Internal Medicine

## 2013-04-17 ENCOUNTER — Ambulatory Visit (INDEPENDENT_AMBULATORY_CARE_PROVIDER_SITE_OTHER): Payer: Managed Care, Other (non HMO) | Admitting: Internal Medicine

## 2013-04-17 ENCOUNTER — Encounter: Payer: Self-pay | Admitting: Internal Medicine

## 2013-04-17 VITALS — BP 120/70 | HR 58 | Temp 98.6°F | Wt 162.4 lb

## 2013-04-17 DIAGNOSIS — R0989 Other specified symptoms and signs involving the circulatory and respiratory systems: Secondary | ICD-10-CM

## 2013-04-17 DIAGNOSIS — R0600 Dyspnea, unspecified: Secondary | ICD-10-CM

## 2013-04-17 DIAGNOSIS — R9389 Abnormal findings on diagnostic imaging of other specified body structures: Secondary | ICD-10-CM

## 2013-04-17 DIAGNOSIS — R918 Other nonspecific abnormal finding of lung field: Secondary | ICD-10-CM

## 2013-04-17 DIAGNOSIS — R0609 Other forms of dyspnea: Secondary | ICD-10-CM

## 2013-04-17 LAB — PULMONARY FUNCTION TEST
DL/VA % PRED: 101 %
DL/VA: 4.61 ml/min/mmHg/L
DLCO UNC % PRED: 95 %
DLCO UNC: 29.72 ml/min/mmHg
FEF 25-75 PRE: 4.5 L/s
FEF 25-75 Post: 3.87 L/sec
FEF2575-%Change-Post: -14 %
FEF2575-%PRED-PRE: 158 %
FEF2575-%Pred-Post: 136 %
FEV1-%Change-Post: 0 %
FEV1-%PRED-PRE: 106 %
FEV1-%Pred-Post: 105 %
FEV1-Post: 3.65 L
FEV1-Pre: 3.66 L
FEV1FVC-%Change-Post: 0 %
FEV1FVC-%Pred-Pre: 114 %
FEV6-%CHANGE-POST: 1 %
FEV6-%PRED-POST: 98 %
FEV6-%Pred-Pre: 97 %
FEV6-Post: 4.28 L
FEV6-Pre: 4.23 L
FEV6FVC-%Change-Post: 0 %
FEV6FVC-%Pred-Post: 105 %
FEV6FVC-%Pred-Pre: 104 %
FVC-%Change-Post: 0 %
FVC-%PRED-POST: 93 %
FVC-%Pred-Pre: 93 %
FVC-Post: 4.28 L
FVC-Pre: 4.28 L
POST FEV1/FVC RATIO: 85 %
POST FEV6/FVC RATIO: 100 %
Pre FEV1/FVC ratio: 86 %
Pre FEV6/FVC Ratio: 99 %
RV % pred: 100 %
RV: 2.22 L
TLC % PRED: 95 %
TLC: 6.5 L

## 2013-04-17 NOTE — Patient Instructions (Signed)
It was good to see you today.  We have reviewed your prior records including labs and tests today  Test(s) ordered today (chest xray). Your results will be released to [place] (or called to you) after review, usually within 72hours after test completion. If any changes need to be made, you will be notified at that same time.  Medications reviewed and updated, no changes recommended at this time. Refill on medication(s) as discussed today.  we'll make referral for pulmonary function test to evaluate for potential asthma . Our office will contact you regarding appointment(s) once made.  Please schedule followup in 3-4 months to 10 your review; please call sooner if problems.

## 2013-04-17 NOTE — Progress Notes (Signed)
Subjective:    Patient ID: Joseph Booker, male    DOB: November 11, 1950, 63 y.o.   MRN: [id number]  Breathing Problem He complains of difficulty breathing and shortness of breath. There is no chest tightness, cough, frequent throat clearing, hoarse voice, sputum production or wheezing. This is a recurrent (every winter, worse in past 6 months) problem. The problem occurs daily. The problem has been waxing and waning. Pertinent negatives include no chest pain, fever, postnasal drip, rhinorrhea, sneezing, sore throat or trouble swallowing. His symptoms are aggravated by minimal activity (dry air). His symptoms are alleviated by rest. He reports minimal improvement on treatment. There is no history of emphysema or pneumonia. Asthma: ??    Also reviewed chronic medical issues and interval medical events  Past Medical History  Diagnosis Date  . Osteopenia   . Esophagitis   . CAD (coronary artery disease)     [name] / nuclear 2005, excellent tolerance no scar or ischemia, EF 51% / nuclear, February, 2012, no scar or ischemia / catheterization March 26, 2010.. 10% in-stent restenosis, minimal other nonobstructive coronary disease, ejection fraction 55%., excellent result; ETT 9/13: ex 13:14, no CP, no ischemic ECG changes  . INGUINAL HERNIA   . OSTEOPENIA   . VITAMIN D DEFICIENCY   . ALLERGIC RHINITIS   . ANEMIA, IRON DEFICIENCY   . Anxiety state, unspecified   . ULCERATIVE COLITIS-UNIVERSAL   . Hyperlipidemia   . GERD (gastroesophageal reflux disease)   . Arm pain     Right arm discomfort with some radiation from the biceps down to the wrist..  January, 2014    Review of Systems  Constitutional: Negative for fever and fatigue.  HENT: Negative for dental problem, drooling, hoarse voice, postnasal drip, rhinorrhea, sinus pressure, sneezing, sore throat, trouble swallowing and voice change.   Respiratory: Positive for shortness of breath. Negative for cough, sputum production and  wheezing.   Cardiovascular: Negative for chest pain and leg swelling.  Neurological: Negative for weakness.       Objective:   Physical Exam  BP 120/70  Pulse 58  Temp(Src) 98.6 F (37 C) (Oral)  Wt 162 lb 6.4 oz (73.664 kg)  SpO2 96% Wt Readings from Last 3 Encounters:  04/17/13 162 lb 6.4 oz (73.664 kg)  04/01/13 158 lb (71.668 kg)  05/17/12 163 lb (73.936 kg)   Constitutional: he appears well-developed and well-nourished. No distress.  Neck: Normal range of motion. Neck supple. No JVD present. No thyromegaly present.  Cardiovascular: Normal rate, regular rhythm and normal heart sounds.  No murmur heard. No BLE edema. Pulmonary/Chest: Effort normal and breath sounds normal. No respiratory distress. he has no wheezes.  Psychiatric: he has a normal mood and affect. His behavior is normal. Judgment and thought content normal.   Lab Results  Component Value Date   WBC 5.2 03/20/2013   HGB 14.9 03/20/2013   HCT 44.6 03/20/2013   PLT 189.0 03/20/2013   GLUCOSE 92 03/20/2013   CHOL 141 03/20/2013   TRIG 24.0 03/20/2013   HDL 56.80 03/20/2013   LDLCALC 79 03/20/2013   ALT 34 03/20/2013   AST 34 03/20/2013   NA 140 03/20/2013   K 3.8 03/20/2013   CL 107 03/20/2013   CREATININE 1.0 03/20/2013   BUN 18 03/20/2013   CO2 27 03/20/2013   TSH 1.30 03/20/2013   PSA 2.59 05/18/2009   INR 1.4* 03/22/2010   04/01/13 ETT: negative for ischemia, negative for arrhythmia  Chest x-ray and CT lungs  2007 reviewed -unremarkable     Assessment & Plan:   Dyspnea. Reports triggered by dry air worse in size. Not specifically exertional, not positional, not seasonal. Describes feeling winded with conversation but not with exercise. No associated weight loss, cough, chest tightness, wheeze or allergy symptoms. Concern for potential asthma given family history of same (sister)  Reviewed recent cardiac stress test 2 weeks ago, negative for ischemia Reviewed recent labs: No anemia or metabolic abnormalities  Check chest x-ray now  and refer for pulmonary function testing consider CT chest with pulmonary consult if abnormalities or persistent symptoms  No medication changes recommended today in absence of clarifying diagnosis  Reviewed with patient who understands and agrees to same  Problem List Items Addressed This Visit   None    Visit Diagnoses   Dyspnea    -  Primary    Relevant Orders       DG Chest 2 View       Pulmonary Function Test

## 2013-04-17 NOTE — Progress Notes (Signed)
Pre visit review using our clinic review tool, if applicable. No additional management support is needed unless otherwise documented below in the visit note. 

## 2013-04-18 DIAGNOSIS — R9389 Abnormal findings on diagnostic imaging of other specified body structures: Secondary | ICD-10-CM | POA: Insufficient documentation

## 2013-04-18 NOTE — Addendum Note (Signed)
Addended by: Gwendolyn Grant A on: 04/18/2013 09:13 AM   Modules accepted: Orders

## 2013-04-23 ENCOUNTER — Encounter: Payer: Self-pay | Admitting: *Deleted

## 2013-04-23 ENCOUNTER — Other Ambulatory Visit: Payer: Self-pay | Admitting: Gastroenterology

## 2013-04-24 ENCOUNTER — Ambulatory Visit (INDEPENDENT_AMBULATORY_CARE_PROVIDER_SITE_OTHER): Payer: Managed Care, Other (non HMO) | Admitting: Internal Medicine

## 2013-04-24 ENCOUNTER — Ambulatory Visit: Payer: Managed Care, Other (non HMO) | Admitting: Internal Medicine

## 2013-04-24 DIAGNOSIS — R0602 Shortness of breath: Secondary | ICD-10-CM

## 2013-04-24 NOTE — Progress Notes (Signed)
PFT done today. 

## 2013-04-30 ENCOUNTER — Other Ambulatory Visit: Payer: Managed Care, Other (non HMO)

## 2013-04-30 ENCOUNTER — Ambulatory Visit (INDEPENDENT_AMBULATORY_CARE_PROVIDER_SITE_OTHER): Payer: Managed Care, Other (non HMO) | Admitting: Internal Medicine

## 2013-04-30 ENCOUNTER — Encounter: Payer: Self-pay | Admitting: Internal Medicine

## 2013-04-30 VITALS — BP 134/72 | HR 54 | Ht 69.0 in | Wt 165.8 lb

## 2013-04-30 DIAGNOSIS — R0600 Dyspnea, unspecified: Secondary | ICD-10-CM

## 2013-04-30 DIAGNOSIS — R0609 Other forms of dyspnea: Secondary | ICD-10-CM

## 2013-04-30 DIAGNOSIS — R0989 Other specified symptoms and signs involving the circulatory and respiratory systems: Secondary | ICD-10-CM

## 2013-04-30 NOTE — Patient Instructions (Addendum)
Unclear cause for shortness of breath but could include hiatal hernia and Ulcerative colitis related pulmonary issues even though PFT normal Will need to rule out Pulmonar Embolism: Please do d-dimer blood test for shortness of breath Depending on the results of this test will advise you about CT scan of the chest

## 2013-04-30 NOTE — Progress Notes (Signed)
Subjective:    Patient ID: Joseph Booker, male    DOB: [birth year], 63 y.o.   MRN: [id number] PCP Gwendolyn Grant, MD  HPI  IOV 04/30/2013  Chief Complaint  Patient presents with  . Pulmonary Consult    Referred by Dr. Asa Lente for abnormal cxr and dyspnea   63 year old male with medical problems listed below reports dyspnea. Insidious onset 6 months to a year ago. It is progressive. Moderate in intensity. It mostly occurs at rest when he is talking a lot or when he is sitting and doing some paperwork or when he lies down. When he walks 5 miles a day for his regular exercise he does not short of breath. Dyspnea is relieved by talking less of stopping talking. There is no specific relieving factors other than this. He feels like he cannot get a deep sensation. He reports associated hiatal hernia that is apparently large according to his history 15 years ago when he was subjected to endoscopy. I personally reviewed his imaging from 2007 CT scan of the abdomen and chest and I do not see a large hiatal hernia. On chest x-ray 2015 there is a small hiatal hernia present. He also seems have pectus excavatum of the chest that is mild but his PFTs are normal.. No PE risk factors   reports that he has never smoked. He has never used smokeless tobacco.  Body mass index is 24.47 kg/(m^2).   Ct chest 2007 - emphysema per report, reporeted but in my opinion has LLL GGO  CXR 4/1/12015: emphysema with clear lung fields per report, hyperinflaion per me. Also small hiatal hernia present according to me  Function test 04/17/2013 shows  -FEV13.6 L/106%. FVC 4.3 L/92%. Ratio of 86. Post bronchodilator FEV1 is 3.65/105%. No bronchodilator response. Total lung capacity 6.5/95%. Residual volume 2.2 L/100%. DLCO 29.7/95%  - Cardiac exercise stress test March 2015: As normal according to patient and primary care physician  - Past medical history: Has ulcerative colitis for many years. Used to be on chronic  prednisone several years ago. Currently followed by Dr. Deatra Ina. Gives history of "  large hiatal hernia" diagnosed 15 years ago by Dr. Gershon Crane on endoscopy a chest x-ray shows a small hiatal hernia in 2015    - Family history: Mom died of interstitial lung disease. Sister has asthma   Past Medical History  Diagnosis Date  . Osteopenia   . Esophagitis   . CAD (coronary artery disease)     [name] / nuclear 2005, excellent tolerance no scar or ischemia, EF 51% / nuclear, February, 2012, no scar or ischemia / catheterization March 26, 2010.. 10% in-stent restenosis, minimal other nonobstructive coronary disease, ejection fraction 55%., excellent result; ETT 9/13: ex 13:14, no CP, no ischemic ECG changes  . INGUINAL HERNIA   . OSTEOPENIA   . VITAMIN D DEFICIENCY   . ALLERGIC RHINITIS   . ANEMIA, IRON DEFICIENCY   . Anxiety state, unspecified   . ULCERATIVE COLITIS-UNIVERSAL   . Hyperlipidemia   . GERD (gastroesophageal reflux disease)   . Arm pain     Right arm discomfort with some radiation from the biceps down to the wrist..  January, 2014     Family History  Problem Relation Age of Onset  . Diabetes Mother   . Heart disease Mother   . Heart disease Father 67    MI  . Lung disease Mother     ILD  . Asthma Sister      History  Social History  . Marital Status: Married    Spouse Name: N/A    Number of Children: 1  . Years of Education: N/A   Occupational History  . retired    Social History Main Topics  . Smoking status: Never Smoker   . Smokeless tobacco: Never Used     Comment: Married, 1 stepson, 5 g-kids. Retired Emergency planning/management officer, now Herbalist  . Alcohol Use: 0.6 oz/week    1 Glasses of wine per week     Comment: weekly  . Drug Use: No  . Sexual Activity: Not on file   Other Topics Concern  . Not on file   Social History Narrative  . No narrative on file     No Known Allergies   Outpatient Prescriptions Prior to Visit  Medication Sig  Dispense Refill  . aspirin 81 MG tablet Take 81 mg by mouth daily.        Marland Kitchen azaTHIOprine (IMURAN) 50 MG tablet TAKE THREE AND ONE-HALF TABLETS DAILY  315 tablet  1  . clopidogrel (PLAVIX) 75 MG tablet TAKE 1 TABLET DAILY  90 tablet  2  . Coenzyme Q10 (EQL COQ10) 300 MG CAPS 1 po daily       . niacin (NIASPAN) 500 MG CR tablet 500 mg. 2 po daily       . PENTASA 500 MG CR capsule TAKE 2 CAPSULES (1 GRAM) FOUR TIMES A DAY  720 capsule  2  . saw palmetto 160 MG capsule Take 160 mg by mouth daily.       . simvastatin (ZOCOR) 10 MG tablet TAKE 1 TABLET AT BEDTIME  90 tablet  0   No facility-administered medications prior to visit.       Review of Systems  Constitutional: Negative for fever and unexpected weight change.  HENT: Positive for postnasal drip and rhinorrhea. Negative for congestion, dental problem, ear pain, nosebleeds, sinus pressure, sneezing, sore throat and trouble swallowing.   Eyes: Negative for redness and itching.  Respiratory: Positive for cough, chest tightness and shortness of breath. Negative for wheezing.   Cardiovascular: Negative for palpitations and leg swelling.  Gastrointestinal: Negative for nausea and vomiting.  Genitourinary: Negative for dysuria.  Musculoskeletal: Negative for joint swelling.  Skin: Negative for rash.  Neurological: Negative for headaches.  Hematological: Does not bruise/bleed easily.  Psychiatric/Behavioral: Negative for dysphoric mood. The patient is not nervous/anxious.        Objective:   Physical Exam  Nursing note and vitals reviewed. Constitutional: He is oriented to person, place, and time. He appears well-developed and well-nourished. No distress.  HENT:  Head: Normocephalic and atraumatic.  Right Ear: External ear normal.  Left Ear: External ear normal.  Mouth/Throat: Oropharynx is clear and moist. No oropharyngeal exudate.  Eyes: Conjunctivae and EOM are normal. Pupils are equal, round, and reactive to light. Right eye  exhibits no discharge. Left eye exhibits no discharge. No scleral icterus.  Neck: Normal range of motion. Neck supple. No JVD present. No tracheal deviation present. No thyromegaly present.  Cardiovascular: Normal rate, regular rhythm and intact distal pulses.  Exam reveals no gallop and no friction rub.   No murmur heard. Pulmonary/Chest: Effort normal and breath sounds normal. No respiratory distress. He has no wheezes. He has no rales. He exhibits no tenderness.  ? Mild pectus excavatum  Abdominal: Soft. Bowel sounds are normal. He exhibits no distension and no mass. There is no tenderness. There is no rebound and no guarding.  Musculoskeletal:  Normal range of motion. He exhibits no edema and no tenderness.  Lymphadenopathy:    He has no cervical adenopathy.  Neurological: He is alert and oriented to person, place, and time. He has normal reflexes. No cranial nerve deficit. Coordination normal.  Skin: Skin is warm and dry. No rash noted. He is not diaphoretic. No erythema. No pallor.  Psychiatric: He has a normal mood and affect. His behavior is normal. Judgment and thought content normal.          Assessment & Plan:

## 2013-05-01 ENCOUNTER — Telehealth: Payer: Self-pay | Admitting: Internal Medicine

## 2013-05-01 DIAGNOSIS — K5190 Ulcerative colitis, unspecified, without complications: Secondary | ICD-10-CM

## 2013-05-01 LAB — D-DIMER, QUANTITATIVE: D-Dimer, Quant: 0.27 ug/mL-FEU (ref 0.00–0.48)

## 2013-05-01 NOTE — Telephone Encounter (Signed)
Pt aware of results and recs.  CT ordered.  Nothing further needed at this time.

## 2013-05-01 NOTE — Telephone Encounter (Signed)
Joseph Booker  Needs to be High Resolution CT chest without contrast on ILD protocol. Only  Dr Lorin Picket or Dr. Vinnie Langton to read   Please double check    Dr. Brand Males, M.D., [facility].C.P Pulmonary and Critical Care Medicine Staff Physician [facility] Pager: [phone], If no answer or between  15:00h - 7:00h: call [phone]  05/01/2013 5:56 PM

## 2013-05-01 NOTE — Telephone Encounter (Signed)
D dimer test is normal. So no blood clot or atleast unlikely. Dyspnea remains unexplained. Please order   High Resolution CT chest without contrast on ILD protocol. Only  Dr Lorin Picket or Dr. Vinnie Langton to read: indication; Dyspnea, ulcerative colitis. Hiatal hernia - put all 3

## 2013-05-02 ENCOUNTER — Other Ambulatory Visit: Payer: Self-pay

## 2013-05-02 NOTE — Telephone Encounter (Signed)
Spoke with Joseph Booker with imaging.  CT has been ordered high res without contrast ild protocol, blietz or entrikin to read.  Nothing further needed.

## 2013-05-05 NOTE — Assessment & Plan Note (Signed)
Unclear cause for shortness of breath but could include hiatal hernia and Ulcerative colitis related pulmonary issues even though PFT normal Will need to rule out Pulmonar Embolism: Please do d-dimer blood test for shortness of breath Depending on the results of this test will advise you about CT scan of the ches

## 2013-05-08 ENCOUNTER — Telehealth: Payer: Self-pay | Admitting: Internal Medicine

## 2013-05-08 ENCOUNTER — Other Ambulatory Visit: Payer: Managed Care, Other (non HMO)

## 2013-05-08 ENCOUNTER — Other Ambulatory Visit: Payer: Self-pay | Admitting: Cardiology

## 2013-05-08 NOTE — Telephone Encounter (Signed)
Per pt Questionnaire: Joseph Booker,   We will be happy to forward your questions/concerns to Joseph Booker for you.  Once he provides his feedback, we will contact you either through [place] or phone.  If you should have any further questions prior to hearing from our office, please let us know.  Thank you and have a great day! [facility] ===View-only below this line===   ----- Message -----    From: Joseph Booker    Sent: 05/08/2013 11:19 AM EDT      To: Joseph Males, MD Subject: RE: Questionnaire Submission  I believe my concerns about my ultra low triglyceride levels might be related to my lung problems.  This is why I asked Joseph. Chase Booker to review the numbers. My finger tips are very dry and so is my mouth.  They have lost some of their elasticity and feeling.  Is it possible that this same problem is occurring in my lungs? Could this "drying out" effect show up as emphysema on an x-ray?  ----- Message ----- From: Joseph Booker Sent: 05/08/2013 11:05 AM EDT To: Joseph Booker Subject: RE: Questionnaire Submission  In regards to reviewing your labs you would need to contact Joseph Booker office since she is your primary care doctor. Thanks and have a great day!  ----- Message -----    From: Joseph Booker    Sent: 05/08/2013 10:20 AM EDT      To: Joseph Males, MD Subject: Questionnaire Submission  Patient Questionnaire Submission --------------------------------  Questionnaire: Questionnaire  Question: How are you feeling after your recent visit? Answer:   The same.  Question: Does the recommended course of treatment seem to be helping your symptoms? Answer:   There is no treatment plan at this time.  Question: Are you experiencing any side effects from your recommended treatment? Answer:   n/a  Question: is there anything else you would like to ask your physician? Answer:   Please check my recent blood test results.  My lipid profile (specifically my  triglycerides) looks odd to me.  ----- Message -----    From: Joseph Males, MD    Sent: 05/05/2013  7:37 PM      To: Joseph Booker Subject: Questionnaire   To ensure we are providing you the highest quality healthcare, we'd like to know how you are feeling after your recent visit. At your earliest convenience, please complete the brief follow-up assessment by clicking the Task: Questionnaire link listed above.   Thank you for your time in helping [place] improve our services and for partnering with [place] in your wellness and care.   Sincerely,   Your Care Team  =================================================== Joseph please advise, thank you. Message    I believe my concerns about my ultra low triglyceride levels might be related to my lung problems. This is why I asked Joseph. Chase Booker to review the numbers.   My finger tips are very dry and so is my mouth. They have lost some of their elasticity and feeling. Is it possible that this same problem is occurring in my lungs? Could this "drying out" effect show up as emphysema on an x-ray?

## 2013-05-08 NOTE — Telephone Encounter (Signed)
I can only answer question after CT chest

## 2013-05-09 ENCOUNTER — Ambulatory Visit (INDEPENDENT_AMBULATORY_CARE_PROVIDER_SITE_OTHER)
Admission: RE | Admit: 2013-05-09 | Discharge: 2013-05-09 | Disposition: A | Discharge status: Home / Self Care | Payer: Managed Care, Other (non HMO) | Source: Ambulatory Visit | Attending: Internal Medicine | Admitting: Internal Medicine

## 2013-05-09 DIAGNOSIS — K5190 Ulcerative colitis, unspecified, without complications: Secondary | ICD-10-CM

## 2013-05-09 NOTE — Telephone Encounter (Signed)
I have sent pt an email .

## 2013-05-10 ENCOUNTER — Telehealth: Payer: Self-pay | Admitting: Internal Medicine

## 2013-05-10 DIAGNOSIS — I719 Aortic aneurysm of unspecified site, without rupture: Secondary | ICD-10-CM

## 2013-05-10 NOTE — Telephone Encounter (Signed)
Highly anxious patient. So non urgent result to be called in to him today so sending this to triage instead of [place] [facility].   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 Please give him news and fu with cardiology next week [facility] Dr Ron Parker or his PA  REturn to see me in 1 month   Dr. Brand Males, M.D., [facility].C.P Pulmonary and Critical Care Medicine Staff Physician [place] [facility] Pager: [phone], If no answer or between  15:00h - 7:00h: call [phone]  05/10/2013 10:52 AM  Ct Chest High Resolution  05/09/2013   CLINICAL DATA:  Increasing shortness of breath over the past few months. Evaluate for potential interstitial lung disease.  EXAM: CHEST CT WITHOUT CONTRAST  TECHNIQUE: Multidetector CT imaging of the chest was performed following the standard protocol without intravenous contrast. High resolution imaging of the lungs, as well as inspiratory and expiratory imaging, was performed.  COMPARISON:  Chest CT 03/18/2005.  FINDINGS: Mediastinum: Heart size is normal. There is no significant pericardial fluid, thickening or pericardial calcification. There is atherosclerosis of the thoracic aorta, the great vessels of the mediastinum and the coronary arteries, including calcified atherosclerotic plaque in the left anterior descending coronary arteries. Left anterior descending coronary artery stent also noted. Ectasia of the ascending thoracic aorta which measures up to 4.2 cm in diameter. The in aortic root also appears prominent measuring approximately 43 mm in diameter at the level of the sinuses of Valsalva. No pathologically enlarged mediastinal or hilar lymph nodes. Please note that accurate exclusion of hilar adenopathy is limited on noncontrast CT scans. Esophagus is  unremarkable in appearance.  Lungs/Pleura: High-resolution images demonstrate no significant ground-glass attenuation, subpleural reticulation, parenchymal banding, traction bronchiectasis or honeycombing to suggest an underlying interstitial lung disease at this time. Inspiratory and expiratory imaging is unremarkable. No acute consolidative airspace disease. No pleural effusions. No suspicious appearing pulmonary nodules or masses.  Upper Abdomen: Unremarkable.  Musculoskeletal: There are no aggressive appearing lytic or blastic lesions noted in the visualized portions of the skeleton.  IMPRESSION: 1. No evidence of underlying interstitial lung disease. 2. Atherosclerosis, including left anterior descending coronary artery disease. Patient has a left anterior descending coronary artery stent in position. 3. In addition, there is ectasia of the ascending thoracic aorta, and prominence of the aortic root which is estimated to measure at least 43 mm at the level of the sinuses of Valsalva. Given the lack of lung findings to account for the patient's symptoms, consideration for further evaluation with echocardiography to assess for potential aortic valve disease may be warranted if clinically appropriate.   Electronically Signed   By: Vinnie Langton M.D.   On: 05/09/2013 16:37

## 2013-05-10 NOTE — Telephone Encounter (Signed)
Called and spoke with pt and he is aware of results per MR.  Order has been placed for the pt to be set up with Dr. Ron Parker next week for appt.  Pt is aware. Pt stated that he will call back to schedule the appt with MR in 1 month.

## 2013-05-10 NOTE — Telephone Encounter (Signed)
Pt is requesting results prior to the weekend & can be reached at  [phone].  Joseph Booker

## 2013-05-10 NOTE — Telephone Encounter (Signed)
lmomtcb x1 for pt 

## 2013-05-13 ENCOUNTER — Encounter: Payer: Self-pay | Admitting: Cardiology

## 2013-05-13 ENCOUNTER — Ambulatory Visit (INDEPENDENT_AMBULATORY_CARE_PROVIDER_SITE_OTHER): Payer: Managed Care, Other (non HMO) | Admitting: Cardiology

## 2013-05-13 ENCOUNTER — Telehealth: Payer: Self-pay | Admitting: Cardiology

## 2013-05-13 VITALS — BP 138/88 | HR 64 | Ht 69.0 in | Wt 160.1 lb

## 2013-05-13 DIAGNOSIS — I2510 Atherosclerotic heart disease of native coronary artery without angina pectoris: Secondary | ICD-10-CM

## 2013-05-13 DIAGNOSIS — K5190 Ulcerative colitis, unspecified, without complications: Secondary | ICD-10-CM

## 2013-05-13 DIAGNOSIS — I77810 Thoracic aortic ectasia: Secondary | ICD-10-CM | POA: Insufficient documentation

## 2013-05-13 DIAGNOSIS — K219 Gastro-esophageal reflux disease without esophagitis: Secondary | ICD-10-CM

## 2013-05-13 DIAGNOSIS — R209 Unspecified disturbances of skin sensation: Secondary | ICD-10-CM

## 2013-05-13 DIAGNOSIS — R0602 Shortness of breath: Secondary | ICD-10-CM

## 2013-05-13 DIAGNOSIS — R202 Paresthesia of skin: Secondary | ICD-10-CM

## 2013-05-13 DIAGNOSIS — Q676 Pectus excavatum: Secondary | ICD-10-CM | POA: Insufficient documentation

## 2013-05-13 DIAGNOSIS — R200 Anesthesia of skin: Secondary | ICD-10-CM | POA: Insufficient documentation

## 2013-05-13 NOTE — Telephone Encounter (Signed)
The pt is scheduled to see Dr Ron Parker today at 4 pm, the pt made aware by scheduler.

## 2013-05-13 NOTE — Assessment & Plan Note (Addendum)
The etiology of his shortness of breath is not clear. Two-dimensional echo will be obtained to further assess his aortic root in his LV and RV function. We will proceed with a full cardiopulmonary exercise test to see if more information can be obtained. I have tried to reassure him. I have encouraged him to go about usual activities. I have made plans for early followup to be sure that we continue to watch him carefully. I have ordered a CPK to see if there is any suggestion of a muscular process causing his problem. Eventually it may be necessary to proceed with cardiac catheterization. However I feel this is not necessary at this time.  I feel it is safe for him to continue to drive.  As part of today's evaluation I have spent greater than 60 minutes with his full assessment. I spent an extensive amount of time talking the patient and his wife on a diagnostic basis and a reassuring bases.

## 2013-05-13 NOTE — Assessment & Plan Note (Signed)
The patient may have slight pectus excavatum. I think it is very unlikely that this is playing any role with his current symptoms.

## 2013-05-13 NOTE — Assessment & Plan Note (Signed)
As of today I suspect that this is a nonspecific finding for him.

## 2013-05-13 NOTE — Telephone Encounter (Signed)
New Message  Pt called states that he was advised to come in for an urgent appt with dr. Ron Parker. We offered a PA or Np. Pt declined. Requests an urgent appt with Dr. Ron Parker being that he has a thorasdic aorta anuerism with difficulty breathing sharp chest pain. Per Dr. Ron Parker add pt to the 4 pm time slot on 05/13/2013.

## 2013-05-13 NOTE — Assessment & Plan Note (Signed)
There is a history of GERD. He also says that he has a large hiatal hernia.

## 2013-05-13 NOTE — Assessment & Plan Note (Signed)
The patient has known coronary disease. His last catheterization was March, 2012. He had 10% in-stent restenosis. GXT in March, 2015 showed good tolerance and no ischemia. As of today I am not convinced that his shortness of breath represents ischemia. However this possibility has to be kept in mind. I've chosen not to proceed with ischemia evaluation.

## 2013-05-13 NOTE — Patient Instructions (Signed)
Your physician has requested that you have an echocardiogram. Echocardiography is a painless test that uses sound waves to create images of your heart. It provides your doctor with information about the size and shape of your heart and how well your heart's chambers and valves are working. This procedure takes approximately one hour. There are no restrictions for this procedure.Dr.Katz wants scheduled soon   Scheduled Cardio Pulmonary Stress Test   Lab work today ( CPK )   Your physician recommends that you schedule a follow-up appointment in: 2 weeks with Dr.Katz.

## 2013-05-13 NOTE — Assessment & Plan Note (Signed)
The patient has ulcerative colitis that is under good control. He is on Imuran.

## 2013-05-13 NOTE — Assessment & Plan Note (Signed)
From the CT scan there is suggestion that he has some dilatation of the sinuses of Valsalva. He will have a 2-D echo to assess this area and his aortic valve and his LV function. RV function can also be assessed.

## 2013-05-13 NOTE — Progress Notes (Signed)
Patient ID: Joseph Booker, male   DOB: 1950/12/01, 63 y.o.   MRN: [id number]    HPI  The patient is seen today as an urgent add-on because he had been told that he has a new diagnosis of a thoracic aortic aneurysm. Fortunately, he does not have a thoracic aortic aneurysm. I have reviewed his old records carefully. I have reviewed the recent evaluation by primary care and pulmonary. I have reviewed his recent high-resolution chest CT scan. I had an extensive discussion with the patient and his wife.  The patient does have coronary artery disease. He received a stent in the past. The stent was patent on his last catheterization. He had a standard treadmill within the past few months showing no obvious ischemia. Most recently he has had shortness of breath. He feels that he cannot take a deep breath. He does not have PND or orthopnea. He does think that he is becoming more short of breath when he tries to walk. His chest x-ray showed no evidence of CHF. There was question of some interstitial findings. He had pulmonary function studies showing no significant abnormalities. He had a high-resolution chest CT that showed no significant interstitial lung disease. It was noted on that CT that he had coronary artery disease. He was also noted that he is aorta was somewhat ectatic. It was mentioned that the measurement of his aorta at the sinuses of Valsalva was 43 mm. This is mildly enlarged. This does not represent a thoracic aortic aneurysm. The patient has had a mild cough but this has not been a major symptom. He has had some chest discomfort with deep inspiration. He also has some chest discomfort after his large dog jumped up on him yesterday.  It is my understanding the patient does have a large hiatal hernia. I do not know if this could be playing a role with his current symptoms.  The patient is on Imuran for ulcerative colitis. This problem has been stable.  He has had lab studies very recently showing  normal CBC and normal chemistries. His lipids are quite good. He is concerned that his triglycerides may be too low. TSH was normal.    No Known Allergies  Current Outpatient Prescriptions  Medication Sig Dispense Refill  . aspirin 81 MG tablet Take 81 mg by mouth daily.        Marland Kitchen azaTHIOprine (IMURAN) 50 MG tablet TAKE THREE AND ONE-HALF TABLETS DAILY  315 tablet  1  . clopidogrel (PLAVIX) 75 MG tablet TAKE 1 TABLET DAILY  90 tablet  2  . Coenzyme Q10 (EQL COQ10) 300 MG CAPS 1 po daily       . fluticasone (FLONASE) 50 MCG/ACT nasal spray Place 2 sprays into both nostrils daily as needed for allergies or rhinitis.      Marland Kitchen niacin (NIASPAN) 500 MG CR tablet 500 mg. 2 po daily       . PENTASA 500 MG CR capsule TAKE 2 CAPSULES (1 GRAM) FOUR TIMES A DAY  720 capsule  2  . saw palmetto 160 MG capsule Take 160 mg by mouth daily.       . simvastatin (ZOCOR) 10 MG tablet TAKE 1 TABLET AT BEDTIME (NO FURTHER REFILLS WITHOUT APPOINTMENT)  90 tablet  0   No current facility-administered medications for this visit.    History   Social History  . Marital Status: Married    Spouse Name: N/A    Number of Children: 1  . Years of Education:  N/A   Occupational History  . retired    Social History Main Topics  . Smoking status: Never Smoker   . Smokeless tobacco: Never Used     Comment: Married, 1 stepson, 5 g-kids. Retired Emergency planning/management officer, now Herbalist  . Alcohol Use: 0.6 oz/week    1 Glasses of wine per week     Comment: weekly  . Drug Use: No  . Sexual Activity: Not on file   Other Topics Concern  . Not on file   Social History Narrative  . No narrative on file    Family History  Problem Relation Age of Onset  . Diabetes Mother   . Heart disease Mother   . Heart disease Father 94    MI  . Lung disease Mother     ILD  . Asthma Sister     Past Medical History  Diagnosis Date  . Osteopenia   . Esophagitis   . CAD (coronary artery disease)     [name] /  nuclear 2005, excellent tolerance no scar or ischemia, EF 51% / nuclear, February, 2012, no scar or ischemia / catheterization March 26, 2010.. 10% in-stent restenosis, minimal other nonobstructive coronary disease, ejection fraction 55%., excellent result; ETT 9/13: ex 13:14, no CP, no ischemic ECG changes  . INGUINAL HERNIA   . OSTEOPENIA   . VITAMIN D DEFICIENCY   . ALLERGIC RHINITIS   . ANEMIA, IRON DEFICIENCY   . Anxiety state, unspecified   . ULCERATIVE COLITIS-UNIVERSAL   . Hyperlipidemia   . GERD (gastroesophageal reflux disease)   . Arm pain     Right arm discomfort with some radiation from the biceps down to the wrist..  January, 2014    Past Surgical History  Procedure Laterality Date  . Angioplasty      stent 2004  . Hernia repair      2006  . Tonsillectomy      Patient Active Problem List   Diagnosis Date Noted  . Shortness of breath 05/13/2013  . 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 Dyspnea 05/05/2013  . Abnormal chest x-ray 04/18/2013  . Arm pain   . BPH (benign prostatic hypertrophy) 07/06/2011  . CAD (coronary artery disease)   . DYSLIPIDEMIA 06/12/2009  . VITAMIN D DEFICIENCY 06/04/2009  . ANEMIA, IRON DEFICIENCY 06/04/2009  . ALLERGIC RHINITIS 06/04/2009  . GERD 06/04/2009  . ANXIETY STATE, UNSPECIFIED 05/15/2009  . OSTEOPENIA 05/15/2009  . ULCERATIVE COLITIS-UNIVERSAL 05/17/2007    ROS   Patient denies fever, chills, headache, sweats, rash, change in vision, change in hearing, nausea vomiting, urinary symptoms. All other systems are reviewed and are negative.  PHYSICAL EXAM  The patient is oriented to person time and place. Affect is normal. He is here with his wife. In general he appears quite stable. Head is atraumatic. Conjunctiva are normal. There is no carotid bruit. There is no jugulovenous distention. Lungs are clear. Respiratory effort is nonlabored. Cardiac exam reveals an S1 and S2. There is a very soft systolic murmur. The abdomen is soft.  There is no peripheral edema. There are no skin rashes. 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  Filed Vitals:   05/13/13 0912  BP: 138/88  Pulse: 64  Height: 5\' 9"  (1.753 m)  Weight: 160 lb 1.9 oz (72.63 kg)     ASSESSMENT & PLAN

## 2013-05-14 ENCOUNTER — Ambulatory Visit (HOSPITAL_COMMUNITY)
Admission: RE | Admit: 2013-05-14 | Discharge: 2013-05-14 | Disposition: A | Discharge status: Home / Self Care | Payer: Managed Care, Other (non HMO) | Source: Ambulatory Visit | Attending: Cardiovascular Disease | Admitting: Cardiovascular Disease

## 2013-05-14 ENCOUNTER — Telehealth: Payer: Self-pay | Admitting: Internal Medicine

## 2013-05-14 DIAGNOSIS — I2510 Atherosclerotic heart disease of native coronary artery without angina pectoris: Secondary | ICD-10-CM | POA: Insufficient documentation

## 2013-05-14 DIAGNOSIS — I77810 Thoracic aortic ectasia: Secondary | ICD-10-CM

## 2013-05-14 DIAGNOSIS — I059 Rheumatic mitral valve disease, unspecified: Secondary | ICD-10-CM

## 2013-05-14 DIAGNOSIS — I77819 Aortic ectasia, unspecified site: Secondary | ICD-10-CM | POA: Insufficient documentation

## 2013-05-14 DIAGNOSIS — R0602 Shortness of breath: Secondary | ICD-10-CM | POA: Insufficient documentation

## 2013-05-14 LAB — CK: Total CK: 115 U/L (ref 7–232)

## 2013-05-14 NOTE — Progress Notes (Signed)
2D Echo Performed 05/14/2013    Tacy Chavis, RCS  

## 2013-05-14 NOTE — Telephone Encounter (Signed)
We received the following message from the pt through MyChart:  I am not sure what happened here, but I do hope that someone will sit up and take notice.  I was told that I had a thoracic aorta aneurism last Friday. Since I am symptomatic, I spent the entire weekend being very worried about my probable upcoming open chest surgery. Yesterday, I found out from Dr. Ron Parker that that diagnosis was incorrect.   Please review this mistaken diagnosis.   Joseph Booker  lmtcb x1

## 2013-05-14 NOTE — Telephone Encounter (Signed)
I spoke with the pt and apologized about any confusion. I advised the pt that I will have Dr. Chase Caller call him. I advised it will not be today, but I will send message to MR to call the pt to discuss his CT results and any questions he may have.Marland Kitchen

## 2013-05-15 NOTE — Telephone Encounter (Signed)
LMTCB [phone] [id number]  Tonight before 11pm  Dr. Brand Males, M.D., [facility].C.P Pulmonary and Critical Care Medicine Staff Physician [place] [facility] Pager: [phone], If no answer or between  15:00h - 7:00h: call [phone]  05/15/2013 5:19 PM

## 2013-05-15 NOTE — Telephone Encounter (Signed)
Spoke with patient  Explained that I generated confusion on by using the word aneurysm when the report said ectasia because both mean dilatation. Regardless, I wwas going to send him to cards for this. So, I am glad he saw cards DR Ron Parker (I have spoke to Dr Ron Parker on this issue as well). I apologized for confusion. Next test is CPX which has been ordered by cards anyways. I will follow from far and if CPST shows resp causes, will see hm back. HE is agreeable with plan  Thanks  Dr. Brand Males, M.D., [facility].C.P Pulmonary and Critical Care Medicine Staff Physician [place] [facility] Pager: [phone], If no answer or between  15:00h - 7:00h: call [phone]  05/15/2013 6:35 PM

## 2013-05-20 ENCOUNTER — Encounter (HOSPITAL_COMMUNITY): Payer: Managed Care, Other (non HMO)

## 2013-05-23 ENCOUNTER — Ambulatory Visit (HOSPITAL_COMMUNITY): Payer: Managed Care, Other (non HMO) | Attending: Cardiology

## 2013-05-23 ENCOUNTER — Encounter: Payer: Self-pay | Admitting: Cardiology

## 2013-05-23 DIAGNOSIS — Z87898 Personal history of other specified conditions: Secondary | ICD-10-CM

## 2013-05-23 DIAGNOSIS — R9430 Abnormal result of cardiovascular function study, unspecified: Secondary | ICD-10-CM | POA: Insufficient documentation

## 2013-05-23 DIAGNOSIS — R0602 Shortness of breath: Secondary | ICD-10-CM

## 2013-05-23 DIAGNOSIS — I340 Nonrheumatic mitral (valve) insufficiency: Secondary | ICD-10-CM | POA: Insufficient documentation

## 2013-05-23 DIAGNOSIS — I77819 Aortic ectasia, unspecified site: Secondary | ICD-10-CM | POA: Insufficient documentation

## 2013-05-23 DIAGNOSIS — I77810 Thoracic aortic ectasia: Secondary | ICD-10-CM

## 2013-05-23 DIAGNOSIS — I2510 Atherosclerotic heart disease of native coronary artery without angina pectoris: Secondary | ICD-10-CM

## 2013-05-23 HISTORY — DX: Personal history of other specified conditions: Z87.898

## 2013-05-27 ENCOUNTER — Ambulatory Visit (INDEPENDENT_AMBULATORY_CARE_PROVIDER_SITE_OTHER): Payer: Managed Care, Other (non HMO) | Admitting: Cardiology

## 2013-05-27 ENCOUNTER — Encounter: Payer: Self-pay | Admitting: Cardiology

## 2013-05-27 VITALS — BP 137/84 | HR 57 | Ht 69.0 in | Wt 163.1 lb

## 2013-05-27 DIAGNOSIS — R0602 Shortness of breath: Secondary | ICD-10-CM

## 2013-05-27 MED ORDER — SIMVASTATIN 10 MG PO TABS: ORAL_TABLET | ORAL | Status: DC

## 2013-05-27 NOTE — Progress Notes (Signed)
Patient ID: Joseph Booker, male   DOB: [birth year], 63 y.o.   MRN: [id number]    HPI  Patient is seen today to followup coronary disease and to followup the recent sensation of shortness of breath. I have an extensive note in the record dated May 13, 2013. On that day I described to him that he had mild dilatation of his aortic root. We also talked at length about his ongoing evaluation of a sensation of shortness of breath. CPK was checked to be sure he didn't have a type of myositis. The CPK was normal. Two-dimensional echo was done showing again that he has good systolic left ventricular function. He also had a full cardiopulmonary exercise test. The preliminary result shows no significant abnormality. I will await the final reading.  The patient realized that he was taking ginger along with his statin. He stopped them both. He then began to feel much better. In the last day or 2 he had some slight sensation of shortness of breath but not marked.  No Known Allergies  Current Outpatient Prescriptions  Medication Sig Dispense Refill  . aspirin 81 MG tablet Take 81 mg by mouth daily.        Marland Kitchen azaTHIOprine (IMURAN) 50 MG tablet TAKE THREE AND ONE-HALF TABLETS DAILY  315 tablet  1  . clopidogrel (PLAVIX) 75 MG tablet TAKE 1 TABLET DAILY  90 tablet  2  . Coenzyme Q10 (EQL COQ10) 300 MG CAPS 1 po daily       . fluticasone (FLONASE) 50 MCG/ACT nasal spray Place 2 sprays into both nostrils daily as needed for allergies or rhinitis.      Marland Kitchen niacin (NIASPAN) 500 MG CR tablet 500 mg. 2 po daily       . PENTASA 500 MG CR capsule TAKE 2 CAPSULES (1 GRAM) FOUR TIMES A DAY  720 capsule  2  . saw palmetto 160 MG capsule Take 160 mg by mouth daily.        No current facility-administered medications for this visit.    History   Social History  . Marital Status: Married    Spouse Name: N/A    Number of Children: 1  . Years of Education: N/A   Occupational History  . retired    Social History Main  Topics  . Smoking status: Never Smoker   . Smokeless tobacco: Never Used     Comment: Married, 1 stepson, 5 g-kids. Retired Emergency planning/management officer, now Herbalist  . Alcohol Use: 0.6 oz/week    1 Glasses of wine per week     Comment: weekly  . Drug Use: No  . Sexual Activity: Not on file   Other Topics Concern  . Not on file   Social History Narrative  . No narrative on file    Family History  Problem Relation Age of Onset  . Diabetes Mother   . Heart disease Mother   . Heart disease Father 15    MI  . Lung disease Mother     ILD  . Asthma Sister     Past Medical History  Diagnosis Date  . Osteopenia   . Esophagitis   . CAD (coronary artery disease)     [name] / nuclear 2005, excellent tolerance no scar or ischemia, EF 51% / nuclear, February, 2012, no scar or ischemia / catheterization March 26, 2010.. 10% in-stent restenosis, minimal other nonobstructive coronary disease, ejection fraction 55%., excellent result; ETT 9/13: ex 13:14, no CP, no ischemic  ECG changes  . INGUINAL HERNIA   . OSTEOPENIA   . VITAMIN D DEFICIENCY   . ALLERGIC RHINITIS   . ANEMIA, IRON DEFICIENCY   . Anxiety state, unspecified   . ULCERATIVE COLITIS-UNIVERSAL   . Hyperlipidemia   . GERD (gastroesophageal reflux disease)   . Arm pain     Right arm discomfort with some radiation from the biceps down to the wrist..  January, 2014  . Ejection fraction     Past Surgical History  Procedure Laterality Date  . Angioplasty      stent 2004  . Hernia repair      2006  . Tonsillectomy      Patient Active Problem List   Diagnosis Date Noted  . Mitral regurgitation 05/23/2013  . Ejection fraction   . Shortness of breath 05/13/2013  . 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 Pectus excavatum 05/13/2013  . Numbness and tingling in hands 05/13/2013  . Abnormal chest x-ray 04/18/2013  . Arm pain   . BPH (benign prostatic hypertrophy) 07/06/2011  . CAD (coronary artery disease)   .  DYSLIPIDEMIA 06/12/2009  . VITAMIN D DEFICIENCY 06/04/2009  . ANEMIA, IRON DEFICIENCY 06/04/2009  . ALLERGIC RHINITIS 06/04/2009  . GERD 06/04/2009  . ANXIETY STATE, UNSPECIFIED 05/15/2009  . OSTEOPENIA 05/15/2009  . ULCERATIVE COLITIS-UNIVERSAL 05/17/2007    ROS   Patient denies fever, chills, headache, sweats, rash, change in vision, change in hearing, chest pain, cough, nausea or vomiting, urinary symptoms. All other systems are reviewed and are negative.  PHYSICAL EXAM   Patient is stable today. He is oriented to person time and place. Affect is normal. There is no jugulovenous distention. Head is atraumatic. Sclera and conjunctiva are normal. Lungs are clear. Respiratory effort is nonlabored. Cardiac exam reveals S1 and S2. The abdomen is soft. Is no peripheral edema.  Filed Vitals:   05/27/13 1603  BP: 137/84  Pulse: 57  Height: 5\' 9"  (1.753 m)  Weight: 163 lb 1.9 oz (73.991 kg)      ASSESSMENT & PLAN

## 2013-05-27 NOTE — Assessment & Plan Note (Signed)
We know that his PFTs revealed no marked abnormality. High-resolution chest CT revealed no significant interstitial lung disease. His followup 2-D echo shows good left ventricular function. Cardiopulmonary exercise test reveals no marked abnormality. He is feeling better but not completely back to baseline. His CPK was normal. He did stop his statin. He noted also that he had been taking ginger along with a statin. We do not know if this could have been affecting him. The plan for now will be to go about full activity. He will stay off his statin for another week. After that he will resume it at 10 mg every other day for a month and then back to his usual dose of 10 mg daily.  I had a long and careful discussion with him about the findings. At this point I continue to reassure him that his overall cardiac status is stable. As part of today's evaluation I spent greater than 25 minutes with his overall care. More than half of this time was with direct discussion with him. We had a long discussion about the results of his studies. We discussed extensively the idea of whether a statin could be playing a role and whether ginger was planned additional role.

## 2013-05-27 NOTE — Patient Instructions (Addendum)
Your physician has recommended you make the following change in your medication: Stay off of Simvastatin for another week then restart taking it at 10 mg every other night at bedtime.  Your physician recommends that you schedule a follow-up appointment in: 1 month

## 2013-06-03 ENCOUNTER — Encounter: Payer: Self-pay | Admitting: Cardiology

## 2013-06-03 ENCOUNTER — Telehealth: Payer: Self-pay

## 2013-06-03 NOTE — Telephone Encounter (Signed)
This message came through [facility]:   I thought I should give you a heads up on my medical situation. My breathing difficulties have definitely gotten worse. Yesterday while walking my dogs, I had to stop over a dozen times while trying to get a deep breath. Perhaps the heat and walking had something to do with this, but I'm having similar difficulties today and that without doing anything strenuous. Not sure of what to do.   Please advise.

## 2013-06-04 ENCOUNTER — Encounter: Payer: Self-pay | Admitting: Cardiology

## 2013-06-04 NOTE — Telephone Encounter (Signed)
Please call the patient to see how he is feeling today. Please tell him that I have spoken with his primary physician, Dr. Asa Lente. The next step will be for him to see her for a followup visit to rediscuss all of his ongoing symptoms. Please encourage him to call and make that appointment.    Also, please let him know that I spoke directly with Dr. Haroldine Laws about his cardiopulmonary exercise test. Dr. Haroldine Laws is the expert concerning these tests. He does feel that the test is completely normal. This argues against any major cardiac or pulmonary cause for his current symptoms. I want to reassure the patient. I will plan to see him back for his next scheduled appointment with me.

## 2013-06-04 NOTE — Telephone Encounter (Signed)
**Note De-Identified  Obfuscation** The pt is advised, he verbalized understanding and states that he is seeing Dr Asa Lente this Friday May 22. He wants Dr Ron Parker to know that his feet and hands feel very dry and are numb. I will forward this note to Dr Ron Parker as an Juluis Rainier.

## 2013-06-07 ENCOUNTER — Encounter: Payer: Self-pay | Admitting: Internal Medicine

## 2013-06-07 ENCOUNTER — Ambulatory Visit (INDEPENDENT_AMBULATORY_CARE_PROVIDER_SITE_OTHER): Payer: Managed Care, Other (non HMO) | Admitting: Internal Medicine

## 2013-06-07 ENCOUNTER — Other Ambulatory Visit (INDEPENDENT_AMBULATORY_CARE_PROVIDER_SITE_OTHER): Payer: Managed Care, Other (non HMO)

## 2013-06-07 VITALS — BP 142/80 | HR 56 | Temp 98.5°F | Wt 163.2 lb

## 2013-06-07 DIAGNOSIS — R0602 Shortness of breath: Secondary | ICD-10-CM

## 2013-06-07 DIAGNOSIS — R209 Unspecified disturbances of skin sensation: Secondary | ICD-10-CM

## 2013-06-07 DIAGNOSIS — R200 Anesthesia of skin: Secondary | ICD-10-CM

## 2013-06-07 DIAGNOSIS — R202 Paresthesia of skin: Secondary | ICD-10-CM

## 2013-06-07 NOTE — Progress Notes (Signed)
Subjective:    Patient ID: Joseph Booker, male    DOB: [birth year], 63 y.o.   MRN: [id number]  HPI  He re follow up shortness of breath reviewed chronic medical issues and interval medical events  Past Medical History  Diagnosis Date  . Osteopenia   . Esophagitis   . CAD (coronary artery disease)     [name] / nuclear 2005, excellent tolerance no scar or ischemia, EF 51% / nuclear, February, 2012, no scar or ischemia / catheterization March 26, 2010.. 10% in-stent restenosis, minimal other nonobstructive coronary disease, ejection fraction 55%., excellent result; ETT 9/13: ex 13:14, no CP, no ischemic ECG changes  . INGUINAL HERNIA   . OSTEOPENIA   . VITAMIN D DEFICIENCY   . ALLERGIC RHINITIS   . ANEMIA, IRON DEFICIENCY   . Anxiety state, unspecified   . ULCERATIVE COLITIS-UNIVERSAL   . Hyperlipidemia   . GERD (gastroesophageal reflux disease)   . Arm pain     Right arm discomfort with some radiation from the biceps down to the wrist..  January, 2014  . Ejection fraction     Review of Systems  Constitutional: Positive for fatigue. Negative for fever and unexpected weight change.  HENT: Negative for sinus pressure and voice change.   Respiratory: Positive for shortness of breath. Negative for apnea, cough, chest tightness, wheezing and stridor.   Cardiovascular: Negative for leg swelling.  Neurological: Positive for numbness (hands and feet, "dry" feeling with tactile stimulation). Negative for speech difficulty, weakness, light-headedness and headaches.       Objective:   Physical Exam  BP 142/80  Pulse 56  Temp(Src) 98.5 F (36.9 C) (Oral)  Wt 163 lb 3.2 oz (74.027 kg)  SpO2 98% Wt Readings from Last 3 Encounters:  06/07/13 163 lb 3.2 oz (74.027 kg)  05/27/13 163 lb 1.9 oz (73.991 kg)  05/13/13 160 lb 1.9 oz (72.63 kg)   Constitutional: he appears well-developed and well-nourished. No distress.  Neck: Normal range of motion. Neck supple. No JVD  present. No thyromegaly present.  Cardiovascular: Normal rate, regular rhythm and normal heart sounds.  No murmur heard. No BLE edema. Pulmonary/Chest: Effort normal and breath sounds normal. No respiratory distress. he has no wheezes.  Psychiatric: he has a normal mood and affect. His behavior is normal. Judgment and thought content normal.   Lab Results  Component Value Date   WBC 5.2 03/20/2013   HGB 14.9 03/20/2013   HCT 44.6 03/20/2013   PLT 189.0 03/20/2013   GLUCOSE 92 03/20/2013   CHOL 141 03/20/2013   TRIG 24.0 03/20/2013   HDL 56.80 03/20/2013   LDLCALC 79 03/20/2013   ALT 34 03/20/2013   AST 34 03/20/2013   NA 140 03/20/2013   K 3.8 03/20/2013   CL 107 03/20/2013   CREATININE 1.0 03/20/2013   BUN 18 03/20/2013   CO2 27 03/20/2013   TSH 1.30 03/20/2013   PSA 2.59 05/18/2009   INR 1.4* 03/22/2010   No results found for this basename: VITAMINB12    04/01/13 ETT: negative for ischemia, negative for arrhythmia  Chest x-ray and CT lungs 2007 reviewed -unremarkable     Assessment & Plan:   Problem List Items Addressed This Visit   Numbness and tingling in hands     Check B12 and T3, FT4 Neuro eval    Shortness of breath - Primary     Dyspnea. Chronic and progressive over past 3-4 months Reports triggered by dry air or any "scent". symptoms  noted by others (wife, colleagues) to have increased breathing effort at rest  Decreased exercise tolerance due to dyspnea on exertion -walking 5 miles previously without problem, now unable to complete >1.5 mi Not positional, not seasonal.  Describes feeling winded with conversation, rest and any exercise.  symptoms improved with drinking liquid No associated weight loss, cough, chest tightness, wheeze or allergy symptoms.   Reviewed cardiac ETT 04/01/13: negative for ischemia or arrythmia  Reviewed recent labs: No anemia or metabolic abnormalities Reviewed PFTs 04/17/13: no restrictive or obstructive process Reviewed 05/09/13 high res CT: no interstitial lung dz  or process Reviewed 05/14/13 echo - normal LV fx Reviewed cardiopulm stress test 05/23/13: no abnormality  Check B12 and T3 with FT4  Refer to neuro for eval of potential neuromuscular cause given progressive dyspnea with peripheral neuropathy (hands and feet "dry" and numb)  No medication changes recommended today in absence of clarifying diagnosis  Reviewed with patient who understands and agrees to same    Relevant Orders      Vitamin B12      T4, free      T3 (Completed)      Ambulatory referral to Neurology    Other Visit Diagnoses   Numbness of fingers of both hands        Relevant Orders       Vitamin B12       T4, free       T3 (Completed)       Ambulatory referral to Neurology

## 2013-06-07 NOTE — Progress Notes (Signed)
Pre visit review using our clinic review tool, if applicable. No additional management support is needed unless otherwise documented below in the visit note. 

## 2013-06-07 NOTE — Patient Instructions (Signed)
It was good to see you today.  We have reviewed your prior records including labs and tests today  Test(s) ordered today. Your results will be released to [facility] (or called to you) after review, usually within 72hours after test completion. If any changes need to be made, you will be notified at that same time.  Medications reviewed and updated, no changes recommended at this time.  we'll make referral to neurology to evaluate your symptoms . Our office will contact you regarding appointment(s) once made.  Please schedule followup in 3-4 months, call sooner if problems.

## 2013-06-08 LAB — T3: T3, Total: 72.4 ng/dL — ABNORMAL LOW (ref 80.0–204.0)

## 2013-06-08 NOTE — Assessment & Plan Note (Signed)
Dyspnea. Chronic and progressive over past 3-4 months Reports triggered by dry air or any "scent". symptoms noted by others (wife, colleagues) to have increased breathing effort at rest  Decreased exercise tolerance due to dyspnea on exertion -walking 5 miles previously without problem, now unable to complete >1.5 mi Not positional, not seasonal.  Describes feeling winded with conversation, rest and any exercise.  symptoms improved with drinking liquid No associated weight loss, cough, chest tightness, wheeze or allergy symptoms.   Reviewed cardiac ETT 04/01/13: negative for ischemia or arrythmia  Reviewed recent labs: No anemia or metabolic abnormalities Reviewed PFTs 04/17/13: no restrictive or obstructive process Reviewed 05/09/13 high res CT: no interstitial lung dz or process Reviewed 05/14/13 echo - normal LV fx Reviewed cardiopulm stress test 05/23/13: no abnormality  Check B12 and T3 with FT4  Refer to neuro for eval of potential neuromuscular cause given progressive dyspnea with peripheral neuropathy (hands and feet "dry" and numb)  No medication changes recommended today in absence of clarifying diagnosis  Reviewed with patient who understands and agrees to same

## 2013-06-08 NOTE — Assessment & Plan Note (Signed)
Check B12 and T3, FT4 Neuro eval

## 2013-06-11 ENCOUNTER — Other Ambulatory Visit: Payer: Self-pay | Admitting: Internal Medicine

## 2013-06-11 DIAGNOSIS — R7989 Other specified abnormal findings of blood chemistry: Secondary | ICD-10-CM

## 2013-06-11 LAB — T4, FREE: Free T4: 0.82 ng/dL (ref 0.60–1.60)

## 2013-06-11 LAB — VITAMIN B12: Vitamin B-12: 396 pg/mL (ref 211–911)

## 2013-06-20 ENCOUNTER — Ambulatory Visit (INDEPENDENT_AMBULATORY_CARE_PROVIDER_SITE_OTHER): Payer: Managed Care, Other (non HMO) | Admitting: Internal Medicine

## 2013-06-20 ENCOUNTER — Encounter: Payer: Self-pay | Admitting: Internal Medicine

## 2013-06-20 VITALS — BP 126/80 | HR 58 | Temp 97.8°F | Resp 12 | Ht 69.0 in | Wt 161.0 lb

## 2013-06-20 DIAGNOSIS — R0602 Shortness of breath: Secondary | ICD-10-CM

## 2013-06-20 DIAGNOSIS — R202 Paresthesia of skin: Secondary | ICD-10-CM

## 2013-06-20 DIAGNOSIS — R200 Anesthesia of skin: Secondary | ICD-10-CM

## 2013-06-20 DIAGNOSIS — R209 Unspecified disturbances of skin sensation: Secondary | ICD-10-CM

## 2013-06-20 NOTE — Progress Notes (Signed)
Patient ID: Joseph Booker, male   DOB: 03-28-1950, 63 y.o.   MRN: [id number]   HPI  Joseph Booker is a 63 y.o.-year-old male, referred by his PCP, Dr. 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  He started to have difficulty breathing 2 mo ago >> CXR: emphysema >> CT scan: no emphysema >> PFTs normal >> pulmonary stress test normal >> EKG stress test normal.  He also feels numbness in his fingers >> stopped Imuran (for Ulcerative colitis) on which he has been on for >10 years >> did not see an improvement in numbness yet. No recent flares of UC. Still takes the Pentasa. He also stopped Saw Palmetto >> did not see an improvement in sxs.   He is wondering if his sxs can be related to a possible thyroid disorder.  I reviewed pt's thyroid tests: Lab Results  Component Value Date   TSH 1.30 03/20/2013   TSH 1.10 01/04/2012   TSH 0.73 06/16/2011   TSH 1.36 05/18/2009   TSH 1.50 12/19/2008   FREET4 0.82 06/07/2013   A total T3 (06/07/2013) was slightly low, at 72.4 (80-204).  Pt denies feeling nodules in neck, hoarseness, dysphagia/odynophagia, SOB with lying down.  Pt describes: - + heat/cold intolerance - for a long time - + a little weight gain - + fatigue - for a long time  - no constipation/diarrhea - + dry skin - no hair falling - no depression  He has + FH of thyroid disorders in cousin. No FH of thyroid cancer.  No h/o radiation tx to head or neck. No recent use of iodine supplements.  ROS: Constitutional: no weight gain/loss, no fatigue, no subjective hyperthermia/hypothermia, + increased urination Eyes: no blurry vision, no xerophthalmia ENT: no sore throat, no nodules palpated in throat, no dysphagia/odynophagia, no hoarseness Cardiovascular:+ CP/+ SOB/no palpitations/leg swelling Respiratory: no cough/+ SOB Gastrointestinal: no N/V/D/C Musculoskeletal: no muscle/joint aches Skin: no rashes Neurological: no tremors/+numbness/no  tingling/dizziness Psychiatric: no depression/+ anxiety  Past Medical History  Diagnosis Date  . Osteopenia   . Esophagitis   . CAD (coronary artery disease)     [name] / nuclear 2005, excellent tolerance no scar or ischemia, EF 51% / nuclear, February, 2012, no scar or ischemia / catheterization March 26, 2010.. 10% in-stent restenosis, minimal other nonobstructive coronary disease, ejection fraction 55%., excellent result; ETT 9/13: ex 13:14, no CP, no ischemic ECG changes  . INGUINAL HERNIA   . OSTEOPENIA   . VITAMIN D DEFICIENCY   . ALLERGIC RHINITIS   . ANEMIA, IRON DEFICIENCY   . Anxiety state, unspecified   . ULCERATIVE COLITIS-UNIVERSAL   . Hyperlipidemia   . GERD (gastroesophageal reflux disease)   . Arm pain     Right arm discomfort with some radiation from the biceps down to the wrist..  January, 2014  . Ejection fraction    Past Surgical History  Procedure Laterality Date  . Angioplasty      stent 2004  . Hernia repair      2006  . Tonsillectomy     History   Social History  . Marital Status: Married    Spouse Name: N/A    Number of Children: 1, step-son   Occupational History  . Retired Advertising account planner    Social History Main Topics  . Smoking status: Never Smoker   . Smokeless tobacco: Never Used     Comment: Married, 1 stepson, 5 g-kids. Retired Emergency planning/management officer, now Herbalist  . Alcohol Use:  0.6 oz/week    1 Glasses of wine per week     Comment: weekly  . Drug Use: No   Current Outpatient Prescriptions on File Prior to Visit  Medication Sig Dispense Refill  . aspirin 81 MG tablet Take 81 mg by mouth daily.        . clopidogrel (PLAVIX) 75 MG tablet TAKE 1 TABLET DAILY  90 tablet  2  . Coenzyme Q10 (EQL COQ10) 300 MG CAPS 1 po daily       . Multiple Vitamin (MULTIVITAMIN) tablet Take 1 tablet by mouth daily.      . niacin (NIASPAN) 500 MG CR tablet 500 mg. 2 po daily       . PENTASA 500 MG CR capsule TAKE 2 CAPSULES (1 GRAM) FOUR TIMES  A DAY  720 capsule  2  . simvastatin (ZOCOR) 10 MG tablet Take 1 tablet every other day  45 tablet  3  . azaTHIOprine (IMURAN) 50 MG tablet TAKE THREE AND ONE-HALF TABLETS DAILY  315 tablet  1  . fluticasone (FLONASE) 50 MCG/ACT nasal spray Place 2 sprays into both nostrils daily as needed for allergies or rhinitis.      . saw palmetto 160 MG capsule Take 160 mg by mouth daily.        No current facility-administered medications on file prior to visit.   No Known Allergies Family History  Problem Relation Age of Onset  . Diabetes Mother   . Heart disease Mother   . Heart disease Father 85    MI  . Lung disease Mother     ILD  . Asthma Sister    PE: BP 126/80  Pulse 58  Temp(Src) 97.8 F (36.6 C) (Oral)  Resp 12  Ht 5\' 9"  (1.753 m)  Wt 161 lb (73.029 kg)  BMI 23.76 kg/m2  SpO2 96% Wt Readings from Last 3 Encounters:  06/20/13 161 lb (73.029 kg)  06/07/13 163 lb 3.2 oz (74.027 kg)  05/27/13 163 lb 1.9 oz (73.991 kg)   Constitutional: normal weight, in NAD Eyes: PERRLA, EOMI, no exophthalmos ENT: moist mucous membranes, no thyromegaly, no cervical lymphadenopathy Cardiovascular: RRR, No MRG Respiratory: CTA B Gastrointestinal: abdomen soft, NT, ND, BS+ Musculoskeletal: no deformities, strength intact in all 4 Skin: moist, warm, no rashes Neurological: fine tremor with outstretched hands, DTR normal in all 4  ASSESSMENT: 1. Low TT3 -?Hypothyroidism - normal TSH and free T4  2. Numbness - fingers, toes, perioral  3. SOB  PLAN:  1. Patient with multiple normal TSH levels in the past, however with a low TT3 recently. The TSH and free T4 associated with this results were normal. I reassured the pt that the T3 hormone levels are more fluctuating that the TSH and free T4 and if these are normal, I would not consider this an indication of hypothyroidism. Also, the free hormone levels are preferred to total levels as the latter can be confounded by fluctuations in the  thyroid-binding globulin (TBG).  - with recent normal TSh and free T4, I do not feel repeating these levels is high yield - he does not appear to have a goiter, thyroid nodules, or neck compression symptoms  2. Numbness - we reviewed possible etiologies: Imuran toxicity - he came off 3 weeks ago, but it may take longer to come out his system Hypocalcemia - calcium recently normal Long standing Diabetes - previous glucose levels normal B12 def - B12 a little low, but still normal >> I  suggested to get an im inj to see if he feels better Other neurologic conditions - will see neuro in 2 weeks  3. SOB - unclear if related to #2 above.  We will not schedule a f/u appt   CC - per pt' request: - Dr Ron Parker - cardiology - Dr Deatra Ina Fabienne Bruns

## 2013-06-20 NOTE — Patient Instructions (Signed)
The thyroid tests appear normal.  Try to get a im B12 injection to see if you feel better. Consider seeing a neurologist if symptoms do not improve.

## 2013-06-21 NOTE — Progress Notes (Signed)
Salena Saner,    Thanks for helping with Mr. Skluzacek.    Also, thanks for the excellent care of the patients you have seen for me. Your notes and care are excellrent. In addition, Rich Brave recently went out of his way to mention the excellent care you have provided his patients (I hired Ronalee Belts as the first Financial controller primary care doctor MANY years ago).    I have a favor to ask. I would like to ask you to see a patient with diabetes. She is a personal friend. She is extremely compliant and intelligent. Her name is Konrad Saha. I will call you to discuss this further.  Thanks,  Merry Proud

## 2013-06-22 ENCOUNTER — Encounter: Payer: Self-pay | Admitting: Internal Medicine

## 2013-06-25 ENCOUNTER — Ambulatory Visit: Payer: Managed Care, Other (non HMO)

## 2013-06-26 ENCOUNTER — Ambulatory Visit: Payer: Managed Care, Other (non HMO) | Admitting: Cardiology

## 2013-06-27 ENCOUNTER — Other Ambulatory Visit: Payer: Self-pay | Admitting: Dermatology

## 2013-06-27 ENCOUNTER — Ambulatory Visit (INDEPENDENT_AMBULATORY_CARE_PROVIDER_SITE_OTHER): Payer: Managed Care, Other (non HMO)

## 2013-06-27 ENCOUNTER — Encounter: Payer: Self-pay | Admitting: *Deleted

## 2013-06-27 DIAGNOSIS — E538 Deficiency of other specified B group vitamins: Secondary | ICD-10-CM

## 2013-06-27 MED ORDER — CYANOCOBALAMIN 1000 MCG/ML IJ SOLN
1000.0000 ug | Freq: Once | INTRAMUSCULAR | Status: AC
  Administered 2013-06-27: 1000 ug via INTRAMUSCULAR

## 2013-07-02 ENCOUNTER — Telehealth: Payer: Self-pay | Admitting: Cardiology

## 2013-07-02 NOTE — Telephone Encounter (Signed)
Walk In pt Form " [employer]" Disability Form Dropped Off Sent to [facility] For Processing 6.16.15/km

## 2013-07-03 ENCOUNTER — Encounter: Payer: Self-pay | Admitting: Neurology

## 2013-07-03 ENCOUNTER — Ambulatory Visit (INDEPENDENT_AMBULATORY_CARE_PROVIDER_SITE_OTHER): Payer: Managed Care, Other (non HMO) | Admitting: Neurology

## 2013-07-03 VITALS — BP 134/82 | HR 76 | Temp 97.5°F | Resp 20 | Ht 70.0 in | Wt 163.1 lb

## 2013-07-03 DIAGNOSIS — R200 Anesthesia of skin: Secondary | ICD-10-CM

## 2013-07-03 DIAGNOSIS — R0609 Other forms of dyspnea: Secondary | ICD-10-CM

## 2013-07-03 DIAGNOSIS — R209 Unspecified disturbances of skin sensation: Secondary | ICD-10-CM

## 2013-07-03 DIAGNOSIS — R0989 Other specified symptoms and signs involving the circulatory and respiratory systems: Secondary | ICD-10-CM

## 2013-07-03 DIAGNOSIS — R0600 Dyspnea, unspecified: Secondary | ICD-10-CM

## 2013-07-03 NOTE — Patient Instructions (Signed)
Your exam is normal, which is reassuring.  I know your symptoms are vague.  We will check a couple of blood tests. 1.  We will check for Sjogren's given the dry lips and numbness 2.  We will check for myasthenia gravis, given the shortness of breath. I really don't see any reason to get MRI. I will contact you with results.  Call with questions or concerns.

## 2013-07-03 NOTE — Progress Notes (Addendum)
NEUROLOGY CONSULTATION NOTE  Joseph Booker MRN: [id number] DOB: 02/20/1950  Referring provider: Dr. Asa Lente Primary care provider: Dr. Asa Lente  Reason for consult:  Dyspnea, numbness  HISTORY OF PRESENT ILLNESS: Joseph Booker is a 63 year old right-handed man with history of CAD, hyperlipidemia, iron-deficient anemia, osteopenia, and ulcerative colitis who presents with numbness and dyspnea.  Records personally reviewed.  Symptoms started about 2 months ago. At first, he noted some trouble catching his breath. While speaking, he would hesitate so he could take a breath. At first, he had no problems with exercising. More recently, when he exercises, he notes increased problems with shortness of breath. At one point, he consider going to the ER. The dyspnea can occur at any time of day. He denies any associated dysphasia, double vision, weakness of the limbs, or head drop. Shortly after developing the shortness of breath, he began to experience what he calls a sensation of "dryness "involving the fingertips, toes, and later the lips and nasal passages. He denies any actual numbness, burning or tingling sensation. When he touches objects, they will sometimes feels strange.  He denies neck pain radiating down the arms. He denies dry eyes and dry mouth. He stopped Imuran which he was taking for ulcerative colitis for many years, without improvement.  He stopped [employer] with no improvement.  Total T3 was low (72.4), but TSH and free T4 were normal, so endocrinology did not feel he had hypothyroidism.  He did see pulmonology. Pulmonary function tests were unremarkable. A chest x-ray was performed, which revealed possible emphysema. However, a CT of the chest did not reveal any interstitial lung disease. It did reveal some coronary atherosclerosis and ectasia of the ascending thoracic aorta, and prominence of the aortic root. He denies any preceding viral illness.  06/07/13 LABS:  B12 396, free T4 0.82,  total T3 72.4 05/13/13:  CK 115 03/20/13:  TSH 1.30   PAST MEDICAL HISTORY: Past Medical History  Diagnosis Date  . Osteopenia   . Esophagitis   . CAD (coronary artery disease)     [name] / nuclear 2005, excellent tolerance no scar or ischemia, EF 51% / nuclear, February, 2012, no scar or ischemia / catheterization March 26, 2010.. 10% in-stent restenosis, minimal other nonobstructive coronary disease, ejection fraction 55%., excellent result; ETT 9/13: ex 13:14, no CP, no ischemic ECG changes  . INGUINAL HERNIA   . OSTEOPENIA   . VITAMIN D DEFICIENCY   . ALLERGIC RHINITIS   . ANEMIA, IRON DEFICIENCY   . Anxiety state, unspecified   . ULCERATIVE COLITIS-UNIVERSAL   . Hyperlipidemia   . GERD (gastroesophageal reflux disease)   . Arm pain     Right arm discomfort with some radiation from the biceps down to the wrist..  January, 2014  . Ejection fraction     PAST SURGICAL HISTORY: Past Surgical History  Procedure Laterality Date  . Angioplasty      stent 2004  . Hernia repair      2006  . Tonsillectomy      MEDICATIONS: Current Outpatient Prescriptions on File Prior to Visit  Medication Sig Dispense Refill  . aspirin 81 MG tablet Take 81 mg by mouth daily.        Marland Kitchen azaTHIOprine (IMURAN) 50 MG tablet TAKE THREE AND ONE-HALF TABLETS DAILY  315 tablet  1  . clopidogrel (PLAVIX) 75 MG tablet TAKE 1 TABLET DAILY  90 tablet  2  . Coenzyme Q10 (EQL COQ10) 300 MG CAPS 1  po daily       . fluticasone (FLONASE) 50 MCG/ACT nasal spray Place 2 sprays into both nostrils daily as needed for allergies or rhinitis.      . Multiple Vitamin (MULTIVITAMIN) tablet Take 1 tablet by mouth daily.      Marland Kitchen PENTASA 500 MG CR capsule TAKE 2 CAPSULES (1 GRAM) FOUR TIMES A DAY  720 capsule  2  . saw palmetto 160 MG capsule Take 160 mg by mouth daily.       . simvastatin (ZOCOR) 10 MG tablet Take 1 tablet every other day  45 tablet  3  . niacin (NIASPAN) 500 MG CR tablet 500 mg. 2 po daily         No current facility-administered medications on file prior to visit.    ALLERGIES: No Known Allergies  FAMILY HISTORY: Family History  Problem Relation Age of Onset  . Diabetes Mother   . Heart disease Mother   . Heart disease Father 29    MI  . Lung disease Mother     ILD  . Asthma Sister     SOCIAL HISTORY: History   Social History  . Marital Status: Married    Spouse Name: N/A    Number of Children: 1  . Years of Education: N/A   Occupational History  . retired    Social History Main Topics  . Smoking status: Never Smoker   . Smokeless tobacco: Never Used     Comment: Married, 1 stepson, 5 g-kids. Retired Emergency planning/management officer, now Herbalist  . Alcohol Use: 0.6 oz/week    1 Glasses of wine per week     Comment: weekly  . Drug Use: No  . Sexual Activity: Yes    Partners: Female   Other Topics Concern  . Not on file   Social History Narrative  . No narrative on file    REVIEW OF SYSTEMS: Constitutional: No fevers, chills, or sweats, no generalized fatigue, change in appetite Eyes: No visual changes, double vision, eye pain Ear, nose and throat: No hearing loss, ear pain, nasal congestion, sore throat Cardiovascular: No chest pain, palpitations Respiratory:  No shortness of breath at rest or with exertion, wheezes GastrointestinaI: No nausea, vomiting, diarrhea, abdominal pain, fecal incontinence Genitourinary:  No dysuria, urinary retention or frequency Musculoskeletal:  No neck pain, back pain Integumentary: No rash, pruritus, skin lesions Neurological: as above Psychiatric: No depression, insomnia, anxiety Endocrine: No palpitations, fatigue, diaphoresis, mood swings, change in appetite, change in weight, increased thirst Hematologic/Lymphatic:  No anemia, purpura, petechiae. Allergic/Immunologic: no itchy/runny eyes, nasal congestion, recent allergic reactions, rashes  PHYSICAL EXAM: Filed Vitals:   07/03/13 1444  BP: 134/82  Pulse: 76    Temp: 97.5 F (36.4 C)  Resp: 20   General: No acute distress Head:  Normocephalic/atraumatic Neck: supple, no paraspinal tenderness, full range of motion Back: No paraspinal tenderness Heart: regular rate and rhythm Lungs: Clear to auscultation bilaterally. Vascular: No carotid bruits. Neurological Exam: Mental status: alert and oriented to person, place, and time, recent and remote memory intact, fund of knowledge intact, attention and concentration intact, speech fluent and not dysarthric, language intact. Cranial nerves: CN I: not tested CN II: pupils equal, round and reactive to light, visual fields intact, fundi unremarkable, without vessel changes, exudates, hemorrhages or papilledema. CN III, IV, VI:  full range of motion, no nystagmus, no ptosis CN V: facial sensation intact CN VII: upper and lower face symmetric CN VIII: hearing intact CN IX,  X: gag intact, uvula midline CN XI: sternocleidomastoid and trapezius muscles intact CN XII: tongue midline Bulk & Tone: normal, no fasciculations. Motor: 5 out of 5 throughout Sensation: Pinprick and vibration intact Deep Tendon Reflexes: 2+ throughout, toes downgoing Finger to nose testing: No dysmetria Heel to shin: No dysmetria Gait: Normal station and stride. Able to turn and walk in tandem. Romberg negative.  IMPRESSION: Perceives sensation of dryness of his fingertips, toes, and lips. It is altered sensation but not technically numbness. Dyspnea  I have no real neurologic explanation for his symptoms. His exam is completely normal with no evidence of muscle weakness or neuropathy. Regarding the dyspnea, one consideration could be myasthenia gravis, although he does not exhibit any fatigable weakness and does not have any other symptoms suggestive of this. Since he does note dryness of his nasal passages and lips and "numbness", we could consider Sjogren's disease, although this is unlikely. He has no muscle weakness and has  preserved reflexes, therefore CIDP or other demyelinating neuropathy seems unlikely.  PLAN: To be sure, we will check assess SSA/SSB antibodies and the myasthenia panel. At this point I don't think any MRI of the brain is warranted. We will contact him with the results and if any further evaluation or testing is needed. 45 minutes spent with the patient, over 50% spent counseling, discussing possible etiologies, and coordinating care.  Thank you for allowing me to take part in the care of this patient.  Metta Clines, DO  CC:  Gwendolyn Grant, MD

## 2013-07-04 ENCOUNTER — Ambulatory Visit: Payer: Managed Care, Other (non HMO)

## 2013-07-04 LAB — SJOGRENS SYNDROME-B EXTRACTABLE NUCLEAR ANTIBODY: SSB (La) (ENA) Antibody, IgG: <1

## 2013-07-04 LAB — SJOGRENS SYNDROME-A EXTRACTABLE NUCLEAR ANTIBODY: SSA (RO) (ENA) ANTIBODY, IGG: NEGATIVE

## 2013-07-05 LAB — ACETYLCHOLINE RECEPTOR, BINDING

## 2013-07-06 ENCOUNTER — Encounter: Payer: Self-pay | Admitting: Cardiology

## 2013-07-08 ENCOUNTER — Other Ambulatory Visit: Payer: Self-pay

## 2013-07-08 MED ORDER — CLOPIDOGREL BISULFATE 75 MG PO TABS: ORAL_TABLET | ORAL | Status: DC

## 2013-07-09 ENCOUNTER — Telehealth: Payer: Self-pay | Admitting: Neurology

## 2013-07-09 NOTE — Telephone Encounter (Signed)
No labs results to report yet

## 2013-07-09 NOTE — Telephone Encounter (Signed)
calling for lab results. CB# [phone]. OK to leave a message / Sherri S.

## 2013-07-09 NOTE — Telephone Encounter (Signed)
calling for lab results. CB# [phone]. OK to leave a message / Sherri S.

## 2013-07-10 ENCOUNTER — Encounter: Payer: Self-pay | Admitting: Gastroenterology

## 2013-07-10 NOTE — Telephone Encounter (Signed)
I left message on patient voicemail regarding labs it has not resulted  back to [place] yet

## 2013-07-11 ENCOUNTER — Encounter: Payer: Self-pay | Admitting: Neurology

## 2013-07-12 ENCOUNTER — Telehealth: Payer: Self-pay | Admitting: Neurology

## 2013-07-12 NOTE — Telephone Encounter (Signed)
Pt would like a call back about his blood work please call [phone]

## 2013-07-12 NOTE — Telephone Encounter (Signed)
Patient is aware that labs have still not resulted back to [place] .

## 2013-07-15 ENCOUNTER — Encounter: Payer: Self-pay | Admitting: Internal Medicine

## 2013-07-15 ENCOUNTER — Telehealth: Payer: Self-pay | Admitting: *Deleted

## 2013-07-15 DIAGNOSIS — M3500 Sicca syndrome, unspecified: Secondary | ICD-10-CM

## 2013-07-15 LAB — STRIATED MUSCLE ANTIBODY: Striated Muscle Ab: <1:40 {titer}

## 2013-07-15 NOTE — Telephone Encounter (Signed)
Patient is aware of normal lab work

## 2013-07-15 NOTE — Telephone Encounter (Signed)
Message copied by Claudie Revering on Mon Jul 15, 2013  2:58 PM ------      Message from: JAFFE, ADAM R      Created: Mon Jul 15, 2013 11:28 AM       All blood work is normal.  I don't have a neurologic explanation for his symptoms.      ----- Message -----         From: [facility]         Sent: 07/15/2013  11:06 AM           To: Dudley Major, DO                   ------

## 2013-07-16 NOTE — Addendum Note (Signed)
Addended by: Gwendolyn Grant A on: 07/16/2013 02:48 PM   Modules accepted: Orders

## 2013-07-24 ENCOUNTER — Other Ambulatory Visit: Payer: Self-pay | Admitting: Dermatology

## 2013-07-24 ENCOUNTER — Encounter: Payer: Self-pay | Admitting: Internal Medicine

## 2013-07-24 DIAGNOSIS — M3500 Sicca syndrome, unspecified: Secondary | ICD-10-CM

## 2013-07-24 NOTE — Telephone Encounter (Signed)
Please have [facility] follow up with pt on status of the rheum referral(s) Thanks!

## 2013-08-01 ENCOUNTER — Other Ambulatory Visit: Payer: Self-pay | Admitting: Gastroenterology

## 2013-08-01 NOTE — Telephone Encounter (Signed)
Close Encounter 

## 2013-08-21 ENCOUNTER — Ambulatory Visit (INDEPENDENT_AMBULATORY_CARE_PROVIDER_SITE_OTHER): Payer: Managed Care, Other (non HMO) | Admitting: Cardiology

## 2013-08-21 ENCOUNTER — Encounter: Payer: Self-pay | Admitting: Cardiology

## 2013-08-21 VITALS — BP 120/80 | HR 53 | Ht 69.0 in | Wt 165.0 lb

## 2013-08-21 DIAGNOSIS — I340 Nonrheumatic mitral (valve) insufficiency: Secondary | ICD-10-CM

## 2013-08-21 DIAGNOSIS — H409 Unspecified glaucoma: Secondary | ICD-10-CM | POA: Insufficient documentation

## 2013-08-21 DIAGNOSIS — I209 Angina pectoris, unspecified: Secondary | ICD-10-CM

## 2013-08-21 DIAGNOSIS — R0602 Shortness of breath: Secondary | ICD-10-CM

## 2013-08-21 DIAGNOSIS — I25709 Atherosclerosis of coronary artery bypass graft(s), unspecified, with unspecified angina pectoris: Secondary | ICD-10-CM

## 2013-08-21 DIAGNOSIS — I059 Rheumatic mitral valve disease, unspecified: Secondary | ICD-10-CM

## 2013-08-21 DIAGNOSIS — I25810 Atherosclerosis of coronary artery bypass graft(s) without angina pectoris: Secondary | ICD-10-CM

## 2013-08-21 NOTE — Patient Instructions (Signed)
**Note De-identified  Obfuscation** Your physician recommends that you continue on your current medications as directed. Please refer to the Current Medication list given to you today.  Your physician wants you to follow-up in: 6 months. You will receive a reminder letter in the mail two months in advance. If you don't receive a letter, please call our office to schedule the follow-up appointment.  

## 2013-08-21 NOTE — Assessment & Plan Note (Signed)
Coronary disease is stable.  No further workup. 

## 2013-08-21 NOTE — Progress Notes (Signed)
Patient ID: Joseph Booker, male   DOB: 09-20-1950, 63 y.o.   MRN: [id number]    HPI  Patient returns for her overall followup of his cardiac status. We have undergone an extensive workup for his sensation of shortness of breath. I saw him last in May, 2015. Since then he is been seen by his primary physician, endocrinology, neurology, and a specialist for Sjgren syndrome. The Sjgren evaluation is still ongoing. The patient thinks that stopping niacin may have helped him a little bit.  No Known Allergies  Current Outpatient Prescriptions  Medication Sig Dispense Refill  . aspirin 81 MG tablet Take 81 mg by mouth daily.        Marland Kitchen azaTHIOprine (IMURAN) 50 MG tablet TAKE THREE AND ONE-HALF TABLETS DAILY  315 tablet  0  . clopidogrel (PLAVIX) 75 MG tablet TAKE 1 TABLET DAILY  14 tablet  0  . Coenzyme Q10 (EQL COQ10) 300 MG CAPS 1 po daily       . Multiple Vitamin (MULTIVITAMIN) tablet Take 1 tablet by mouth daily.      Marland Kitchen PENTASA 500 MG CR capsule TAKE 2 CAPSULES (1 GRAM) FOUR TIMES A DAY  720 capsule  2  . saw palmetto 160 MG capsule Take 160 mg by mouth daily.       . simvastatin (ZOCOR) 10 MG tablet Take 1 tablet every other day  45 tablet  3  . Travoprost, BAK Free, (TRAVATAN) 0.004 % SOLN ophthalmic solution Place 1 drop into both eyes at bedtime.       No current facility-administered medications for this visit.    History   Social History  . Marital Status: Married    Spouse Name: N/A    Number of Children: 1  . Years of Education: N/A   Occupational History  . retired    Social History Main Topics  . Smoking status: Never Smoker   . Smokeless tobacco: Never Used     Comment: Married, 1 stepson, 5 g-kids. Retired Emergency planning/management officer, now Herbalist  . Alcohol Use: 0.6 oz/week    1 Glasses of wine per week     Comment: weekly  . Drug Use: No  . Sexual Activity: Yes    Partners: Female   Other Topics Concern  . Not on file   Social History Narrative  . No narrative on  file    Family History  Problem Relation Age of Onset  . Diabetes Mother   . Heart disease Mother   . Heart disease Father 17    MI  . Lung disease Mother     ILD  . Asthma Sister     Past Medical History  Diagnosis Date  . Osteopenia   . Esophagitis   . CAD (coronary artery disease)     [name] / nuclear 2005, excellent tolerance no scar or ischemia, EF 51% / nuclear, February, 2012, no scar or ischemia / catheterization March 26, 2010.. 10% in-stent restenosis, minimal other nonobstructive coronary disease, ejection fraction 55%., excellent result; ETT 9/13: ex 13:14, no CP, no ischemic ECG changes  . INGUINAL HERNIA   . OSTEOPENIA   . VITAMIN D DEFICIENCY   . ALLERGIC RHINITIS   . ANEMIA, IRON DEFICIENCY   . Anxiety state, unspecified   . ULCERATIVE COLITIS-UNIVERSAL   . Hyperlipidemia   . GERD (gastroesophageal reflux disease)   . Arm pain     Right arm discomfort with some radiation from the biceps down to the wrist..  January, 2014  . Ejection fraction     Past Surgical History  Procedure Laterality Date  . Angioplasty      stent 2004  . Hernia repair      2006  . Tonsillectomy      Patient Active Problem List   Diagnosis Date Noted  . Glaucoma 08/21/2013  . Mitral regurgitation 05/23/2013  . Ejection fraction   . Shortness of breath 05/13/2013  . 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 Pectus excavatum 05/13/2013  . Numbness and tingling in hands 05/13/2013  . Arm pain   . BPH (benign prostatic hypertrophy) 07/06/2011  . CAD (coronary artery disease)   . DYSLIPIDEMIA 06/12/2009  . VITAMIN D DEFICIENCY 06/04/2009  . ANEMIA, IRON DEFICIENCY 06/04/2009  . ALLERGIC RHINITIS 06/04/2009  . GERD 06/04/2009  . ANXIETY STATE, UNSPECIFIED 05/15/2009  . OSTEOPENIA 05/15/2009  . ULCERATIVE COLITIS-UNIVERSAL 05/17/2007    ROS   Patient denies fever, chills, headache, sweats, rash, change in vision, change in hearing, chest pain, cough, nausea  vomiting, urinary symptoms. All other systems are reviewed and are negative.  PHYSICAL EXAM  He is stable today. He is oriented to person time and place. Affect is normal. Head is atraumatic. Sclera and conjunctiva are normal. There is no jugulovenous distention. Lungs are clear. Respiratory effort is nonlabored. Cardiac exam reveals S1 and S2. Abdomen is soft. There is no peripheral edema.  Filed Vitals:   08/21/13 1602  BP: 120/80  Pulse: 53  Height: 5\' 9"  (1.753 m)  Weight: 165 lb (74.844 kg)   EKG is done today and reviewed by me. There is normal sinus rhythm. There is no significant change.  ASSESSMENT & PLAN

## 2013-08-21 NOTE — Assessment & Plan Note (Signed)
Valvular disease is mild. No further workup.

## 2013-08-21 NOTE — Assessment & Plan Note (Signed)
Etiology of his sensation of shortness of breath remains unclear. He is somewhat improved. There is an ongoing evaluation to rule out Sjogren's syndrome.

## 2013-09-11 ENCOUNTER — Ambulatory Visit (INDEPENDENT_AMBULATORY_CARE_PROVIDER_SITE_OTHER): Payer: Managed Care, Other (non HMO) | Admitting: Gastroenterology

## 2013-09-11 ENCOUNTER — Encounter: Payer: Self-pay | Admitting: Gastroenterology

## 2013-09-11 VITALS — BP 112/70 | HR 72 | Ht 69.0 in | Wt 166.0 lb

## 2013-09-11 DIAGNOSIS — K5090 Crohn's disease, unspecified, without complications: Secondary | ICD-10-CM

## 2013-09-11 NOTE — Assessment & Plan Note (Signed)
Patient remains in clinical remission on Imuran.  We'll continue to check LFTs and CBC twice a year.  Plan followup colonoscopy in 2016

## 2013-09-11 NOTE — Patient Instructions (Signed)
You will have labs in 7 months

## 2013-09-11 NOTE — Progress Notes (Signed)
_                                                                                                                History of Present Illness:  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  He has been include remission for almost 10 years.  He takes Imuran only.  Last colonoscopy 2013 showed no  areas of active inflammation.  Biopsies did not demonstrate dysplasia.  He has no GI complaints per   Past Medical History  Diagnosis Date  . Osteopenia   . Esophagitis   . CAD (coronary artery disease)     [name] / nuclear 2005, excellent tolerance no scar or ischemia, EF 51% / nuclear, February, 2012, no scar or ischemia / catheterization March 26, 2010.. 10% in-stent restenosis, minimal other nonobstructive coronary disease, ejection fraction 55%., excellent result; ETT 9/13: ex 13:14, no CP, no ischemic ECG changes  . INGUINAL HERNIA   . OSTEOPENIA   . VITAMIN D DEFICIENCY   . ALLERGIC RHINITIS   . ANEMIA, IRON DEFICIENCY   . Anxiety state, unspecified   . ULCERATIVE COLITIS-UNIVERSAL   . Hyperlipidemia   . GERD (gastroesophageal reflux disease)   . Arm pain     Right arm discomfort with some radiation from the biceps down to the wrist..  January, 2014  . Ejection fraction   . Glaucoma    Past Surgical History  Procedure Laterality Date  . Angioplasty      stent 2004  . Hernia repair      2006  . Tonsillectomy     family history includes Asthma in his sister; Diabetes in his mother; Heart disease in his mother; Heart disease (age of onset: 13) in his father; Lung disease in his mother. Current Outpatient Prescriptions  Medication Sig Dispense Refill  . aspirin 81 MG tablet Take 81 mg by mouth daily.        Marland Kitchen azaTHIOprine (IMURAN) 50 MG tablet TAKE THREE AND ONE-HALF TABLETS DAILY  315 tablet  0  . clopidogrel (PLAVIX) 75 MG tablet TAKE 1 TABLET DAILY  14 tablet  0  . Coenzyme Q10 (EQL COQ10) 300 MG CAPS 1  po daily       . Krill Oil 300 MG CAPS Take 1 capsule by mouth daily.      . Multiple Vitamin (MULTIVITAMIN) tablet Take 1 tablet by mouth daily.      Marland Kitchen PENTASA 500 MG CR capsule TAKE 2 CAPSULES (1 GRAM) FOUR TIMES A DAY  720 capsule  2  . saw palmetto 160 MG capsule Take 160 mg by mouth daily.       . simvastatin (ZOCOR) 10 MG tablet Take 1 tablet every other day  45 tablet  3  . Travoprost, BAK Free, (TRAVATAN) 0.004 % SOLN ophthalmic solution Place 1 drop into both eyes at bedtime.       No current facility-administered medications for this visit.   Allergies as of  09/11/2013  . (No Known Allergies)    reports that he has never smoked. He has never used smokeless tobacco. He reports that he drinks about .6 ounces of alcohol per week. He reports that he does not use illicit drugs.   Review of Systems: He complains of dry mucous membranes including his eyes nose mouth and the tips of his fingers and toes.  He is undergoing workup for Sjogren's syndrome Pertinent positive and negative review of systems were noted in the above HPI section. All other review of systems were otherwise negative.  Vital signs were reviewed in today's medical record Physical Exam: Joseph Booker: Well developed , well nourished, no acute distress Skin: anicteric Head: Normocephalic and atraumatic Eyes:  sclerae anicteric, EOMI Ears: Normal auditory acuity Mouth: No deformity or lesions Neck: Supple, no masses or thyromegaly Lungs: Clear throughout to auscultation Heart: Regular rate and rhythm; no murmurs, rubs or bruits Abdomen: Soft, non tender and non distended. No masses, hepatosplenomegaly or hernias noted. Normal Bowel sounds Rectal:deferred Musculoskeletal: Symmetrical with no gross deformities  Skin: No lesions on visible extremities Pulses:  Normal pulses noted Extremities: No clubbing, cyanosis, edema or deformities noted Neurological: Alert oriented x 4, grossly nonfocal Cervical Nodes:  No  significant cervical adenopathy Inguinal Nodes: No significant inguinal adenopathy Psychological:  Alert and cooperative. Normal mood and affect  See Assessment and Plan under Problem List

## 2013-09-30 ENCOUNTER — Encounter: Payer: Self-pay | Admitting: Internal Medicine

## 2013-09-30 DIAGNOSIS — R0600 Dyspnea, unspecified: Secondary | ICD-10-CM

## 2013-10-03 NOTE — Addendum Note (Signed)
Addended by: Gwendolyn Grant A on: 10/03/2013 04:13 PM   Modules accepted: Orders

## 2013-10-04 ENCOUNTER — Telehealth: Payer: Self-pay | Admitting: Internal Medicine

## 2013-10-04 NOTE — Telephone Encounter (Signed)
Spoke with the pt  He is asking to switch physicians from [facility] to someone else for a second opinion  He does not have a preference on who he sees  Please advise, thanks!

## 2013-10-04 NOTE — Telephone Encounter (Signed)
LMTCB

## 2013-10-04 NOTE — Telephone Encounter (Signed)
Pt returned call [phone]

## 2013-10-07 NOTE — Telephone Encounter (Signed)
Please advise Dr. Melvyn Novas if you are willing to accept pt? thanks

## 2013-10-07 NOTE — Telephone Encounter (Signed)
He can see anyone he likes in the office. I am ok with him switching Are you/triage asking me if ok to switch? Or are you also asking me for a recommendation?  Thanks  Dr. Brand Males, M.D., [facility].C.P Pulmonary and Critical Care Medicine Staff Physician [place] Pager: [phone], If no answer or between  15:00h - 7:00h: call [phone]  10/07/2013 11:02 AM

## 2013-10-08 NOTE — Telephone Encounter (Signed)
Pt returned call.  Spoke with patient - consult appt scheduled w/ MW for 9.24.15 @ 0930.  Pt aware to arrive early.  Nothing further needed; will sign off.

## 2013-10-08 NOTE — Telephone Encounter (Signed)
lmomtcb x1 for pt 

## 2013-10-08 NOTE — Telephone Encounter (Signed)
Ok with me to see as consult

## 2013-10-10 ENCOUNTER — Encounter: Payer: Self-pay | Admitting: Internal Medicine

## 2013-10-10 ENCOUNTER — Ambulatory Visit (INDEPENDENT_AMBULATORY_CARE_PROVIDER_SITE_OTHER): Payer: Managed Care, Other (non HMO) | Admitting: Internal Medicine

## 2013-10-10 VITALS — BP 120/70 | HR 81 | Temp 98.1°F | Ht 69.0 in | Wt 169.0 lb

## 2013-10-10 DIAGNOSIS — R0602 Shortness of breath: Secondary | ICD-10-CM

## 2013-10-10 MED ORDER — FAMOTIDINE 20 MG PO TABS: ORAL_TABLET | ORAL | Status: DC

## 2013-10-10 MED ORDER — PANTOPRAZOLE SODIUM 40 MG PO TBEC: 40.0000 mg | DELAYED_RELEASE_TABLET | Freq: Every day | ORAL | Status: DC

## 2013-10-10 NOTE — Progress Notes (Signed)
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    10/10/2013 1st [facility] office visit/ [facility]   Chief Complaint  Patient presents with  . Pulmonary Consult    Former patient of Dr Chase Caller. Pt c/o SOB for the past 6 months.  He states that he feels SOB at any time- with or without exertion. He also states strong smells make his breathing worse.   problem sob at rest x 6 months, provoked also by outdoors ex but not inside. Resolves p 3-4 breaths,  Provoked by perfumes or chlorox, never while asleep. No change off statin. Happens every day up to 12 x an hour, sometimes gone hours at time.if just sitting using the computer. Assoc with sensation of globus/ some difficulty with voice use during spells    No obvious other patterns in day to day or daytime variabilty or assoc chronic cough or cp or chest tightness, subjective wheeze overt sinus or hb symptoms. No unusual exp hx or h/o childhood pna/ asthma or knowledge of premature birth.  Sleeping ok without nocturnal  or early am exacerbation  of respiratory  c/o's or need for noct saba. Also denies any obvious fluctuation of symptoms with weather or environmental changes or other aggravating or alleviating factors except as outlined above   Current Medications, Allergies, Complete Past Medical History, Past Surgical History, Family History, and Social History were reviewed in [employer] record.              Review of Systems  Constitutional: Negative for fever, chills, activity change, appetite change and unexpected weight change.  HENT: Positive for congestion. Negative for  dental problem, postnasal drip, rhinorrhea, sneezing, sore throat, trouble swallowing and voice change.   Eyes: Negative for visual disturbance.  Respiratory: Positive for shortness of breath. Negative for cough and choking.   Cardiovascular: Negative for chest pain and leg swelling.  Gastrointestinal: Negative for nausea, vomiting and abdominal pain.  Genitourinary: Negative for difficulty urinating.  Musculoskeletal: Negative for arthralgias.  Skin: Negative for rash.  Psychiatric/Behavioral: Negative for behavioral problems and confusion.       Objective:   Physical Exam   amb slt wm nad slt hoarse  Wt Readings from Last 3 Encounters:  10/10/13 169 lb (76.658 kg)  09/11/13 166 lb (75.297 kg)  08/21/13 165 lb (74.844 kg)      HEENT: nl dentition, turbinates, and orophanx. Nl external ear canals without cough reflex   NECK :  without JVD/Nodes/TM/ nl carotid upstrokes bilaterally   LUNGS: no acc muscle use, clear to A and P bilaterally without cough on insp or exp maneuvers   CV:  RRR  no s3 or murmur or increase in P2, no edema   ABD:  soft and nontender with nl excursion in the supine position. No bruits or organomegaly, bowel sounds nl  MS:  warm without deformities, calf tenderness, cyanosis or clubbing  SKIN: warm and dry without lesions    NEURO:  alert, approp, no deficits    HRCT 05/09/13  1. No evidence of underlying interstitial lung disease.      Assessment &  Plan:

## 2013-10-10 NOTE — Patient Instructions (Signed)
Pantoprazole (protonix) 40 mg   Take 30-60 min before first meal of the day and Pepcid 20 mg one bedtime until return to office - this is the best way to tell whether stomach acid is contributing to your problem.    GERD (REFLUX)  is an extremely common cause of respiratory symptoms, many times with no significant heartburn at all.    It can be treated with medication, but also with lifestyle changes including avoidance of late meals, excessive alcohol, smoking cessation, and avoid fatty foods, chocolate, peppermint, colas, red wine, and acidic juices such as orange juice.  NO MINT OR MENTHOL PRODUCTS SO NO COUGH DROPS  USE SUGARLESS CANDY INSTEAD (jolley ranchers or Stover's)  NO OIL BASED VITAMINS - use powdered substitutes.    Please schedule a follow up office visit in 4 weeks, sooner if needed

## 2013-10-11 NOTE — Assessment & Plan Note (Signed)
April, 2015, PFTs with variably poor insp loop o/w nl , high-resolution chest CT 05/09/13  showing no significant interstitial lung disease  //   CPST  May, 2015, normal, no obvious cardiac or pulmonary basis for shortness of breath though lung volumes quite high.  //   2-D echo, May, 2015, no significant changes, normal LV function  Symptoms are markedly disproportionate to objective findings and not clear this is a lung problem but pt does appear to have difficult airway management issues.   DDX of  difficult airways management all start with A and  include Adherence, Ace Inhibitors, Acid Reflux, Active Sinus Disease, Alpha 1 Antitripsin deficiency, Anxiety masquerading as Airways dz,  ABPA,  allergy(esp in young), Aspiration (esp in elderly), Adverse effects of DPI,  Active smokers, plus two Bs  = Bronchiectasis and Beta blocker use..and one C= CHF  Adherence is always the initial "prime suspect" and is a multilayered concern that requires a "trust but verify" approach in every patient - starting with knowing how to use medications, especially inhalers, correctly, keeping up with refills and understanding the fundamental difference between maintenance and prns vs those medications only taken for a very short course and then stopped and not refilled.   ? Acid (or non-acid) GERD > always difficult to exclude as up to 75% of pts in some series report no assoc GI/ Heartburn symptoms and he has documented large HH with GERD as of 2002 endo> rec max (24h)  acid suppression and diet restrictions/ reviewed and instructions given in writing.   ? Anxiety > note extra large tidal volumes at cpst.  Most pts who don't have lung dz use only 50% of VC with exertion and when they use more that this (regardless of whether there is a pathologic reason for it) it does result in sensation of sob because if limits their insp reserve capacity (the difference between VC and end insp lung volume). Advised should avoid taking  deep breaths and resume plenty of indoor ex which does not seem to provoke his ex symptoms as much.  No evidence at all for ILD assoc with inflammatory bowel dz or other forms of connective tissue dz

## 2013-10-30 ENCOUNTER — Other Ambulatory Visit: Payer: Self-pay | Admitting: Gastroenterology

## 2013-11-01 ENCOUNTER — Other Ambulatory Visit: Payer: Self-pay

## 2013-11-07 ENCOUNTER — Encounter: Payer: Self-pay | Admitting: Internal Medicine

## 2013-11-07 ENCOUNTER — Ambulatory Visit (INDEPENDENT_AMBULATORY_CARE_PROVIDER_SITE_OTHER): Payer: Managed Care, Other (non HMO) | Admitting: Internal Medicine

## 2013-11-07 VITALS — BP 148/80 | HR 63 | Temp 98.9°F | Ht 69.0 in | Wt 164.4 lb

## 2013-11-07 DIAGNOSIS — R0602 Shortness of breath: Secondary | ICD-10-CM

## 2013-11-07 NOTE — Patient Instructions (Addendum)
To get the most out of exercise, you need to be continuously aware that you are short of breath, but never out of breath, for 30 minutes daily. As you improve, it will actually be easier for you to do the same amount of exercise  in  30 minutes so always push to the level where you are short of breath.   Ok to stop acid suppression and stop proair if not helpful   Let us know know if you loose phonation with talking because we can refer you to Dr Joya Gaskins at [facility]

## 2013-11-07 NOTE — Progress Notes (Signed)
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     History of Present Illness  10/10/2013 [facility] office visit/ Joseph Booker   Chief Complaint  Patient presents with  . Pulmonary Consult    Former patient of Dr Chase Caller. Pt c/o SOB for the past 6 months.  He states that he feels SOB at any time- with or without exertion. He also states strong smells make his breathing worse.   problem sob at rest x 6 months, provoked also by outdoors ex but not inside. Resolves p 3-4 breaths,  Provoked by perfumes or chlorox, never while asleep. No change off statin. Happens every day up to 12 x an hour, sometimes gone hours at time.if just sitting using the computer. Assoc with sensation of globus/ some difficulty with voice use during spells  rec Pantoprazole (protonix) 40 mg   Take 30-60 min before first meal of the day and Pepcid 20 mg one bedtime until return to office - this is the best way to tell whether stomach acid is contributing to your problem.      Allergy  Testing [place] neg NO/ nl airflow   11/07/2013 f/u ov/Jarett Dralle re: atypical sob, no better on gerd rx  Chief Complaint  Patient presents with  . Follow-up    Pt states that his breathing is unchanged since the last visit. No new co's today. Using albuterol inhaler maybe twice per wk with no relief.       Unclear whether unable to phonate during spells.  Does fine with indoor exercise including when he did CPST which was nl except for inapprop high VT  No obvious day to day or daytime variabilty or assoc excess or purulent sputum or cp or chest tightness, subjective wheeze overt sinus  or hb symptoms. No unusual exp hx or h/o childhood pna/ asthma or knowledge of premature birth.  Sleeping ok without nocturnal  or early am exacerbation  of respiratory  c/o's or need for noct saba. Also denies any obvious fluctuation of symptoms with weather or environmental changes or other aggravating or alleviating factors except as outlined above   Current Medications, Allergies, Complete Past Medical History, Past Surgical History, Family History, and Social History were reviewed in [employer] record.  ROS  The following are not active complaints unless bolded sore throat, dysphagia, dental problems, itching, sneezing,  nasal congestion or excess/ purulent secretions, ear ache,   fever, chills, sweats, unintended wt loss, pleuritic or exertional cp, hemoptysis,  orthopnea pnd or leg swelling, presyncope, palpitations, heartburn, abdominal pain, anorexia, nausea, vomiting, diarrhea  or change in bowel or urinary habits, change in stools or urine, dysuria,hematuria,  rash, arthralgias, visual complaints, headache, numbness weakness or ataxia or problems with walking or coordination,  change in mood/affect or memory.                        Objective:   Physical Exam   amb slt wm nad Elisabeth Most  11/07/2013      164  Wt Readings from Last 3 Encounters:  10/10/13  169 lb (76.658 kg)  09/11/13 166 lb (75.297 kg)  08/21/13 165 lb (74.844 kg)      HEENT: nl dentition, turbinates, and orophanx. Nl external ear canals without cough reflex   NECK :  without JVD/Nodes/TM/ nl carotid upstrokes bilaterally   LUNGS: no acc muscle use, clear to A and P bilaterally without cough on insp or exp maneuvers   CV:  RRR  no s3 or murmur or increase in P2, no edema   ABD:  soft and nontender with nl excursion in the supine position. No bruits or organomegaly, bowel sounds nl  MS:  warm without deformities, calf tenderness, cyanosis or clubbing  SKIN: warm and dry  without lesions    NEURO:  alert, approp, no deficits    HRCT 05/09/13  1. No evidence of underlying interstitial lung disease.      Assessment & Plan:

## 2013-11-11 NOTE — Assessment & Plan Note (Signed)
April, 2015, PFTs no marked abnormality, high-resolution chest CT showing no significant interstitial lung disease  //   CPST , May, 2015, normal, no obvious cardiac or pulmonary basis for shortness of breath though lung volumes quite high.  //   2-D echo, May, 2015, no significant changes, normal LV function - trial of max gerd RX started 10/10/13 > d/c 11/06/13   I had an extended summary discussion with the patient today lasting 15 to 20 minutes of a 25 minute visit on the following issues:   I believe his problem is all anxiety related = globus sensation/ hyperventilation pattern breathing which is not present with aerobic indoor ex because he paces himself better with this and is also not present at hs because his cortex is not  Overdriving the breathing when he's asleep.  rec regular aerobic exercise and f/u [facility] voice center for globus prn, esp is assoc with loss of phonation.  No pulmonary f/u needed

## 2014-02-12 ENCOUNTER — Other Ambulatory Visit: Payer: Self-pay

## 2014-02-12 MED ORDER — SIMVASTATIN 10 MG PO TABS: ORAL_TABLET | ORAL | Status: DC

## 2014-03-06 ENCOUNTER — Encounter: Payer: Self-pay | Admitting: Internal Medicine

## 2014-03-06 ENCOUNTER — Ambulatory Visit (INDEPENDENT_AMBULATORY_CARE_PROVIDER_SITE_OTHER): Payer: BLUE CROSS/BLUE SHIELD | Admitting: Internal Medicine

## 2014-03-06 VITALS — BP 118/80 | HR 64 | Temp 98.3°F | Resp 16 | Ht 69.0 in | Wt 165.1 lb

## 2014-03-06 DIAGNOSIS — J0100 Acute maxillary sinusitis, unspecified: Secondary | ICD-10-CM

## 2014-03-06 MED ORDER — AMOXICILLIN 500 MG PO CAPS: 500.0000 mg | ORAL_CAPSULE | Freq: Three times a day (TID) | ORAL | Status: DC

## 2014-03-06 NOTE — Progress Notes (Signed)
   Subjective:    Patient ID: Joseph Booker, male    DOB: [birth year], 64 y.o.   MRN: [id number]  HPI  His symptoms began 03/03/14 as cough and upper respiratory tract symptoms. His also had associated fatigue and some dizziness or imbalance. Additionally he describes chills without fever or sweats.  He has itchy watery eyes but has a history of blepharitis and seasonal allergies. This has not exacerbated during this present illness  He has major criteria for rhinosinusitis of congestion, obstruction nasally, nasal purulence with green secretions, and frontal headache. He's had some discomfort in the ear without discharge.  He does not smoke.    Review of Systems He's been evaluated for dyspnea which occurs while he talks and occasionally with exertion. That extensive and detailed evaluation was reviewed.  He's had no exacerbation of dyspnea and he's had no wheezing with present illness.    Objective:   Physical Exam  Other than very minimal erythema of the nasal septum and nasal mucosa and minimal epectus excavatum the exam is unremarkable. Examination included  Evaluation ofwhispered pectoriloquy, tactile fremitus, and assessment of diaphragmatic excursion.  General appearance:Adequately nourished; no acute distress or increased work of breathing is present.  No  lymphadenopathy about the head, neck, or axilla noted.   Eyes: No conjunctival inflammation or lid edema is present. There is no scleral icterus.  Ears:  External ear exam shows no significant lesions or deformities.  Otoscopic examination reveals clear canals, tympanic membranes are intact bilaterally without bulging, retraction, inflammation or discharge.  Nose:  External nasal examination shows no deformity or inflammation.  No septal dislocation or deviation.No obstruction to airflow.   Oral exam: Dental hygiene is good; lips and gums are healthy appearing.There is no oropharyngeal erythema or exudate noted.   Neck:  No  deformities, thyromegaly, masses, or tenderness noted.   Supple with full range of motion without pain.   Heart:  Normal rate and regular rhythm. S1 and S2 normal without gallop, murmur, click, rub or other extra sounds.   Lungs:Chest clear to auscultation; no wheezes, rhonchi,rales ,or rubs present.  Extremities:  No cyanosis, edema, or clubbing  noted    Skin: Warm & dry w/o jaundice or tenting.       Assessment & Plan:  #1 rhinosinusitis without significant bronchitis  Plan: Nasal hygiene interventions discussed. See prescription medications

## 2014-03-06 NOTE — Progress Notes (Signed)
Pre visit review using our clinic review tool, if applicable. No additional management support is needed unless otherwise documented below in the visit note. 

## 2014-03-06 NOTE — Patient Instructions (Signed)

## 2014-03-08 ENCOUNTER — Encounter: Payer: Self-pay | Admitting: Internal Medicine

## 2014-03-09 ENCOUNTER — Emergency Department (INDEPENDENT_AMBULATORY_CARE_PROVIDER_SITE_OTHER): Payer: BLUE CROSS/BLUE SHIELD

## 2014-03-09 ENCOUNTER — Encounter (HOSPITAL_COMMUNITY): Payer: Self-pay | Admitting: Emergency Medicine

## 2014-03-09 ENCOUNTER — Emergency Department (HOSPITAL_COMMUNITY)
Admission: EM | Admit: 2014-03-09 | Discharge: 2014-03-09 | Disposition: A | Discharge status: Home / Self Care | Payer: BLUE CROSS/BLUE SHIELD | Source: Home / Self Care | Attending: Family Medicine | Admitting: Family Medicine

## 2014-03-09 ENCOUNTER — Encounter: Payer: Self-pay | Admitting: Internal Medicine

## 2014-03-09 DIAGNOSIS — J209 Acute bronchitis, unspecified: Secondary | ICD-10-CM

## 2014-03-09 MED ORDER — PREDNISONE 10 MG PO TABS: ORAL_TABLET | ORAL | Status: DC

## 2014-03-09 MED ORDER — MOXIFLOXACIN HCL 400 MG PO TABS: 400.0000 mg | ORAL_TABLET | Freq: Every day | ORAL | Status: DC

## 2014-03-09 MED ORDER — ALBUTEROL SULFATE HFA 108 (90 BASE) MCG/ACT IN AERS: 2.0000 | INHALATION_SPRAY | RESPIRATORY_TRACT | Status: DC | PRN

## 2014-03-09 MED ORDER — HYDROCOD POLST-CHLORPHEN POLST 10-8 MG/5ML PO LQCR
5.0000 mL | Freq: Two times a day (BID) | ORAL | Status: DC | PRN
Start: 2014-03-09 — End: 2014-05-07

## 2014-03-09 MED ORDER — HYDROCOD POLST-CHLORPHEN POLST 10-8 MG/5ML PO LQCR: 5.0000 mL | Freq: Two times a day (BID) | ORAL | Status: DC | PRN

## 2014-03-09 NOTE — ED Notes (Signed)
Pt states that he saw his his PCP on 03/06/2014 and diagnosed with a sinus infection and given amoxicillin. Pt states that he is not feeling any better. Pt state that his cough has gotten worse and he is still congested.

## 2014-03-09 NOTE — Discharge Instructions (Signed)

## 2014-03-09 NOTE — ED Provider Notes (Signed)
CSN: [id number]     Arrival date & time 03/09/14  1431 History   First MD Initiated Contact with Patient 03/09/14 1507     Chief Complaint  Patient presents with  . Cough  . Nasal Congestion  . Chills   (Consider location/radiation/quality/duration/timing/severity/associated sxs/prior Treatment) HPI         64 year old male with history of coronary artery disease, on immunosuppressants for ulcerative colitis, presents complaining of cough, congestion, chest pain with coughing . His symptoms have been present now for about 6 days. He was seen at his primary care physician's office and was prescribed amoxicillin for sinusitis although he denies any sinus pain or pressure at any point. The cough seems to be worse at night when he lays down. No leg pain or swelling. No recent travel or sick contacts. He has chills but no fever, no NVD   Past Medical History  Diagnosis Date  . Osteopenia   . Esophagitis   . CAD (coronary artery disease)     [name] / nuclear 2005, excellent tolerance no scar or ischemia, EF 51% / nuclear, February, 2012, no scar or ischemia / catheterization March 26, 2010.. 10% in-stent restenosis, minimal other nonobstructive coronary disease, ejection fraction 55%., excellent result; ETT 9/13: ex 13:14, no CP, no ischemic ECG changes  . INGUINAL HERNIA   . OSTEOPENIA   . VITAMIN D DEFICIENCY   . ALLERGIC RHINITIS   . ANEMIA, IRON DEFICIENCY   . Anxiety state, unspecified   . ULCERATIVE COLITIS-UNIVERSAL   . Hyperlipidemia   . GERD (gastroesophageal reflux disease)   . Arm pain     Right arm discomfort with some radiation from the biceps down to the wrist..  January, 2014  . Ejection fraction   . Glaucoma    Past Surgical History  Procedure Laterality Date  . Angioplasty      stent 2004  . Hernia repair      2006  . Tonsillectomy     Family History  Problem Relation Age of Onset  . Diabetes Mother   . Heart disease Mother   . Heart disease Father  68    MI  . Lung disease Mother     ILD  . Asthma Sister   . Rheum arthritis Mother    History  Substance Use Topics  . Smoking status: Never Smoker   . Smokeless tobacco: Never Used     Comment: Married, 1 stepson, 5 g-kids. Retired Emergency planning/management officer, now Herbalist  . Alcohol Use: 0.6 oz/week    1 Glasses of wine per week     Comment: weekly    Review of Systems  Constitutional: Positive for chills. Negative for fever.  HENT: Positive for congestion and sore throat. Negative for ear pain and sinus pressure.   Respiratory: Positive for cough. Negative for chest tightness, shortness of breath and wheezing.   Cardiovascular: Positive for chest pain.  Gastrointestinal: Negative for nausea, vomiting and diarrhea.  Musculoskeletal: Negative for neck stiffness.  Skin: Negative for rash.  All other systems reviewed and are negative.   Allergies  Dust mite extract  Home Medications   Prior to Admission medications   Medication Sig Start Date End Date Taking? Authorizing Provider  amoxicillin (AMOXIL) 500 MG capsule Take 1 capsule (500 mg total) by mouth 3 (three) times daily. 03/06/14  Yes Hendricks Limes, MD  aspirin 81 MG tablet Take 81 mg by mouth daily.     Yes Historical Provider, MD  azaTHIOprine (  IMURAN) 50 MG tablet TAKE THREE AND ONE-HALF TABLETS DAILY 10/30/13  Yes Inda Castle, MD  clopidogrel (PLAVIX) 75 MG tablet TAKE 1 TABLET DAILY 07/08/13  Yes Carlena Bjornstad, MD  Multiple Vitamin (MULTIVITAMIN) tablet Take 1 tablet by mouth daily.   Yes Historical Provider, MD  PENTASA 500 MG CR capsule TAKE 2 CAPSULES (1 GRAM) FOUR TIMES A DAY 04/23/13  Yes Inda Castle, MD  simvastatin (ZOCOR) 10 MG tablet Take 1 tablet daily 02/12/14  Yes Carlena Bjornstad, MD  Travoprost, BAK Free, (TRAVATAN) 0.004 % SOLN ophthalmic solution Place 1 drop into both eyes at bedtime.   Yes Historical Provider, MD  albuterol (PROVENTIL HFA;VENTOLIN HFA) 108 (90 BASE) MCG/ACT inhaler Inhale 2 puffs  into the lungs every 4 (four) hours as needed for wheezing. 03/09/14   Liam Graham, PA-C  chlorpheniramine-HYDROcodone (TUSSIONEX PENNKINETIC ER) 10-8 MG/5ML LQCR Take 5 mLs by mouth every 12 (twelve) hours as needed for cough. 03/09/14   Liam Graham, PA-C  moxifloxacin (AVELOX) 400 MG tablet Take 1 tablet (400 mg total) by mouth daily. 03/09/14   Liam Graham, PA-C  Olopatadine HCl 0.6 % SOLN 1 sprays each nostril twice per day 10/30/13   Historical Provider, MD  predniSONE (DELTASONE) 10 MG tablet 4 tabs PO QD for 4 days; 3 tabs PO QD for 3 days; 2 tabs PO QD for 2 days; 1 tab PO QD for 1 day 03/09/14   Liam Graham, PA-C   BP 148/88 mmHg  Pulse 82  Temp(Src) 99.7 F (37.6 C) (Oral)  Resp 18  SpO2 97% Physical Exam  Constitutional: He is oriented to person, place, and time. He appears well-developed and well-nourished. No distress.  HENT:  Head: Normocephalic and atraumatic.  Right Ear: External ear normal.  Left Ear: External ear normal.  Nose: Nose normal.  Mouth/Throat: Oropharynx is clear and moist. No oropharyngeal exudate.  Eyes: Conjunctivae are normal.  Neck: Normal range of motion. Neck supple.  Cardiovascular: Normal rate, regular rhythm and normal heart sounds.   Pulmonary/Chest: Effort normal and breath sounds normal. No respiratory distress.  Lymphadenopathy:    He has no cervical adenopathy.  Neurological: He is alert and oriented to person, place, and time. Coordination normal.  Skin: Skin is warm and dry. No rash noted. He is not diaphoretic.  Psychiatric: He has a normal mood and affect. Judgment normal.  Nursing note and vitals reviewed.   ED Course  Procedures (including critical care time) Labs Review Labs Reviewed - No data to display  Imaging Review Dg Chest 1 View  03/09/2014   CLINICAL DATA:  PA with nipple markers. Followup earlier chest x-ray.  EXAM: CHEST  1 VIEW  COMPARISON:  03/09/2014  FINDINGS: The cardiac silhouette, mediastinal and  hilar contours are within normal limits and stable. Bilateral nipple markers are in place. I do not see the abnormality questioned on the earlier chest film. I think this is most likely the costal chondral cartilage. The lungs are clear. No pleural effusion.  IMPRESSION: No acute cardiopulmonary findings and no worrisome pulmonary lesions.   Electronically Signed   By: Marijo Sanes M.D.   On: 03/09/2014 16:21   Dg Chest 2 View  03/09/2014   CLINICAL DATA:  Productive cough with a grade fever, history coronary disease  EXAM: CHEST  2 VIEW  COMPARISON:  04/17/2013  FINDINGS: Normal heart size, mediastinal contours, and pulmonary vascularity.  Question RIGHT nipple shadow.  Lungs appear emphysematous but  clear.  No pleural effusion or pneumothorax.  Bones unremarkable.  IMPRESSION: Emphysematous changes.  Question RIGHT nipple shadow; repeat PA chest radiograph with nipple markers recommended to exclude pulmonary nodule.   Electronically Signed   By: Lavonia Dana M.D.   On: 03/09/2014 15:58     MDM   1. Acute bronchitis, unspecified organism    treating for pneumonia given his progression of symptoms and immune compromised state. Treat with Avelox as well as somatic management. Follow-up when necessary   Meds ordered this encounter  Medications  . predniSONE (DELTASONE) 10 MG tablet    Sig: 4 tabs PO QD for 4 days; 3 tabs PO QD for 3 days; 2 tabs PO QD for 2 days; 1 tab PO QD for 1 day    Dispense:  30 tablet    Refill:  0  . moxifloxacin (AVELOX) 400 MG tablet    Sig: Take 1 tablet (400 mg total) by mouth daily.    Dispense:  7 tablet    Refill:  0  . chlorpheniramine-HYDROcodone (TUSSIONEX PENNKINETIC ER) 10-8 MG/5ML LQCR    Sig: Take 5 mLs by mouth every 12 (twelve) hours as needed for cough.    Dispense:  115 mL    Refill:  0  . albuterol (PROVENTIL HFA;VENTOLIN HFA) 108 (90 BASE) MCG/ACT inhaler    Sig: Inhale 2 puffs into the lungs every 4 (four) hours as needed for wheezing.     Dispense:  1 Inhaler    Refill:  0       Liam Graham, PA-C 03/09/14 1639

## 2014-03-10 MED ORDER — MOXIFLOXACIN HCL 400 MG PO TABS: 400.0000 mg | ORAL_TABLET | Freq: Every day | ORAL | Status: DC

## 2014-03-18 ENCOUNTER — Telehealth: Payer: Self-pay | Admitting: *Deleted

## 2014-03-18 NOTE — Telephone Encounter (Signed)
--   Message -----    From: Oda Kilts, CMA    Sent: 03/18/2014      To: Oda Kilts, CMA Subject: labs                                           Patient needs labs in 7 months   Due 03-18-2014  Orders in   Informed pt that labs are due      Will come in this week or next and get drawn

## 2014-03-19 ENCOUNTER — Other Ambulatory Visit (INDEPENDENT_AMBULATORY_CARE_PROVIDER_SITE_OTHER): Payer: BLUE CROSS/BLUE SHIELD

## 2014-03-19 DIAGNOSIS — K5090 Crohn's disease, unspecified, without complications: Secondary | ICD-10-CM

## 2014-03-19 LAB — HEPATIC FUNCTION PANEL
ALBUMIN: 4 g/dL (ref 3.5–5.2)
ALT: 14 U/L (ref 0–53)
AST: 18 U/L (ref 0–37)
Alkaline Phosphatase: 45 U/L (ref 39–117)
Bilirubin, Direct: 0.1 mg/dL (ref 0.0–0.3)
Total Bilirubin: 0.6 mg/dL (ref 0.2–1.2)
Total Protein: 7.1 g/dL (ref 6.0–8.3)

## 2014-03-19 LAB — CBC WITH DIFFERENTIAL/PLATELET
Basophils Absolute: 0.1 10*3/uL (ref 0.0–0.1)
Basophils Relative: 0.7 % (ref 0.0–3.0)
EOS PCT: 2.6 % (ref 0.0–5.0)
Eosinophils Absolute: 0.2 10*3/uL (ref 0.0–0.7)
HCT: 47 % (ref 39.0–52.0)
Hemoglobin: 16.4 g/dL (ref 13.0–17.0)
LYMPHS PCT: 21.1 % (ref 12.0–46.0)
Lymphs Abs: 1.8 10*3/uL (ref 0.7–4.0)
MCHC: 34.8 g/dL (ref 30.0–36.0)
MCV: 96.9 fl (ref 78.0–100.0)
MONO ABS: 0.8 10*3/uL (ref 0.1–1.0)
MONOS PCT: 10 % (ref 3.0–12.0)
NEUTROS PCT: 65.6 % (ref 43.0–77.0)
Neutro Abs: 5.5 10*3/uL (ref 1.4–7.7)
PLATELETS: 286 10*3/uL (ref 150.0–400.0)
RBC: 4.85 Mil/uL (ref 4.22–5.81)
RDW: 14.9 % (ref 11.5–15.5)
WBC: 8.4 10*3/uL (ref 4.0–10.5)

## 2014-03-19 NOTE — Progress Notes (Signed)
Quick Note:  Please inform the patient that lab work was normal and to continue current plan of action ______ 

## 2014-04-03 ENCOUNTER — Encounter: Payer: Self-pay | Admitting: Gastroenterology

## 2014-04-05 ENCOUNTER — Other Ambulatory Visit: Payer: Self-pay | Admitting: Cardiology

## 2014-04-05 ENCOUNTER — Encounter: Payer: Self-pay | Admitting: Internal Medicine

## 2014-04-08 ENCOUNTER — Other Ambulatory Visit: Payer: Self-pay

## 2014-04-08 MED ORDER — CLOPIDOGREL BISULFATE 75 MG PO TABS: ORAL_TABLET | ORAL | Status: DC

## 2014-04-10 ENCOUNTER — Other Ambulatory Visit: Payer: Self-pay | Admitting: Cardiology

## 2014-04-11 MED ORDER — CLOPIDOGREL BISULFATE 75 MG PO TABS: ORAL_TABLET | ORAL | Status: DC

## 2014-04-11 NOTE — Telephone Encounter (Signed)
Medication sent to pharmacy  

## 2014-04-12 ENCOUNTER — Encounter: Payer: Self-pay | Admitting: Cardiology

## 2014-04-15 ENCOUNTER — Telehealth: Payer: Self-pay

## 2014-04-15 NOTE — Telephone Encounter (Signed)
[facility] need to talk to patient

## 2014-05-03 ENCOUNTER — Encounter: Payer: Self-pay | Admitting: Gastroenterology

## 2014-05-07 ENCOUNTER — Ambulatory Visit (INDEPENDENT_AMBULATORY_CARE_PROVIDER_SITE_OTHER): Payer: BLUE CROSS/BLUE SHIELD | Admitting: Cardiology

## 2014-05-07 ENCOUNTER — Encounter: Payer: Self-pay | Admitting: Cardiology

## 2014-05-07 VITALS — BP 124/86 | HR 68 | Ht 69.0 in | Wt 167.0 lb

## 2014-05-07 DIAGNOSIS — E785 Hyperlipidemia, unspecified: Secondary | ICD-10-CM

## 2014-05-07 DIAGNOSIS — I2510 Atherosclerotic heart disease of native coronary artery without angina pectoris: Secondary | ICD-10-CM

## 2014-05-07 DIAGNOSIS — Q676 Pectus excavatum: Secondary | ICD-10-CM

## 2014-05-07 DIAGNOSIS — R0602 Shortness of breath: Secondary | ICD-10-CM

## 2014-05-07 DIAGNOSIS — I77810 Thoracic aortic ectasia: Secondary | ICD-10-CM

## 2014-05-07 DIAGNOSIS — I340 Nonrheumatic mitral (valve) insufficiency: Secondary | ICD-10-CM

## 2014-05-07 NOTE — Patient Instructions (Addendum)
Medication Instructions:  None  Labwork: None  Testing/Procedures: None  Follow-Up: Your physician recommends that you schedule a follow-up appointment in: 9 months

## 2014-05-07 NOTE — Assessment & Plan Note (Signed)
The patient received a Cypher stent to the LAD in 2004. He has been reevaluated several times since then with no obvious ischemia found. His most recent cath was 2012. There was minimal non-obstructive disease. There was 10% in-stent restenosis. He had a standard treadmill in March, 2015 showing no ischemia. I was trying to further assess his shortness of breath at that time. No further cardiac workup is needed.

## 2014-05-07 NOTE — Assessment & Plan Note (Signed)
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 There is some dilatation of the aortic root by echo in April, 2015. This can be followed up over time.

## 2014-05-07 NOTE — Progress Notes (Signed)
Cardiology Office Note   Date:  05/07/2014   ID:  Joseph Booker, DOB 01/17/1951, MRN [id number]  PCP:  Gwendolyn Grant, MD  Cardiologist:  Dola Argyle, MD   Chief Complaint  Patient presents with  . Appointment    Follow-up coronary artery disease      History of Present Illness: Joseph Booker is a 64 y.o. male who presents today to follow-up coronary disease and shortness of breath. He is stable. He continues to have a sensation of shortness of breath when he takes a deep breath. He is able to do his exercise. The etiology of his shortness of breath has never been clear. The patient has had an extensive workup over time. It has been my opinion that his shortness of breath is not related to his cardiopulmonary status. He has undergone a cardiopulmonary exercise test showing no significant abnormality.  The patient is aware that I will be retiring. He has specifically requested follow-up with Dr.Nishan. We will arrange for this.    Past Medical History  Diagnosis Date  . Osteopenia   . Esophagitis   . CAD (coronary artery disease)     [name] / nuclear 2005, excellent tolerance no scar or ischemia, EF 51% / nuclear, February, 2012, no scar or ischemia / catheterization March 26, 2010.. 10% in-stent restenosis, minimal other nonobstructive coronary disease, ejection fraction 55%., excellent result; ETT 9/13: ex 13:14, no CP, no ischemic ECG changes  . INGUINAL HERNIA   . OSTEOPENIA   . VITAMIN D DEFICIENCY   . ALLERGIC RHINITIS   . ANEMIA, IRON DEFICIENCY   . Anxiety state, unspecified   . ULCERATIVE COLITIS-UNIVERSAL   . Hyperlipidemia   . GERD (gastroesophageal reflux disease)   . Arm pain     Right arm discomfort with some radiation from the biceps down to the wrist..  January, 2014  . Ejection fraction   . Glaucoma     Past Surgical History  Procedure Laterality Date  . Angioplasty      stent 2004  . Hernia repair      2006  . Tonsillectomy       Patient Active Problem List   Diagnosis Date Noted  . Glaucoma 08/21/2013  . Mitral regurgitation 05/23/2013  . Ejection fraction   . Shortness of breath 05/13/2013  . 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 Pectus excavatum 05/13/2013  . Numbness and tingling in hands 05/13/2013  . Arm pain   . BPH (benign prostatic hypertrophy) 07/06/2011  . CAD (coronary artery disease)   . DYSLIPIDEMIA 06/12/2009  . VITAMIN D DEFICIENCY 06/04/2009  . ANEMIA, IRON DEFICIENCY 06/04/2009  . ALLERGIC RHINITIS 06/04/2009  . GERD 06/04/2009  . ANXIETY STATE, UNSPECIFIED 05/15/2009  . OSTEOPENIA 05/15/2009  . ULCERATIVE COLITIS-UNIVERSAL 05/17/2007      Current Outpatient Prescriptions  Medication Sig Dispense Refill  . aspirin 81 MG tablet Take 81 mg by mouth daily.      Marland Kitchen azaTHIOprine (IMURAN) 50 MG tablet TAKE THREE AND ONE-HALF TABLETS DAILY 315 tablet 3  . clopidogrel (PLAVIX) 75 MG tablet TAKE 1 TABLET DAILY 90 tablet 3  . Multiple Vitamin (MULTIVITAMIN) tablet Take 1 tablet by mouth daily.    . Olopatadine HCl 0.6 % SOLN 1 sprays each nostril twice per day    . PENTASA 500 MG CR capsule TAKE 2 CAPSULES (1 GRAM) FOUR TIMES A DAY 720 capsule 2  . simvastatin (ZOCOR) 10 MG tablet Take 1 tablet daily 90 tablet 4  . Travoprost,  BAK Free, (TRAVATAN) 0.004 % SOLN ophthalmic solution Place 1 drop into both eyes at bedtime.     No current facility-administered medications for this visit.    Allergies:   Dust mite extract    Social History:  The patient  reports that he has never smoked. He has never used smokeless tobacco. He reports that he drinks about 0.6 oz of alcohol per week. He reports that he does not use illicit drugs.   Family History:  The patient's family history includes Asthma in his sister; Diabetes in his mother; Heart disease in his mother; Heart disease (age of onset: 65) in his father; Lung disease in his mother; Rheum arthritis in his mother.    ROS:  Please see the history  of present illness.   Patient denies fever, chills, headache, sweats, rash, change in vision, change in hearing, chest pain, cough, nausea or vomiting, urinary symptoms. All other systems are reviewed and are negative other than the history of present illness.     PHYSICAL EXAM: VS:  BP 124/86 mmHg  Pulse 68  Ht 5\' 9"  (1.753 m)  Wt 167 lb (75.751 kg)  BMI 24.65 kg/m2 , The patient is stable. He is oriented to person time and place. Affect is normal. Head is atraumatic. Sclera and conjunctiva are normal. There is no jugulovenous distention. Lungs are clear. Respiratory effort is not labored. Cardiac exam reveals an S1 and S2. The rhythm is regular. Abdomen is soft. There is no peripheral edema. There are no musculoskeletal deformities. There are no skin rashes. Neurologic is grossly intact.  EKG:   EKG is not done today.   Recent Labs: 03/19/2014: ALT 14; Hemoglobin 16.4; Platelets 286.0    Lipid Panel    Component Value Date/Time   CHOL 141 03/20/2013 1012   TRIG 24.0 03/20/2013 1012   TRIG 46 12/19/2008   HDL 56.80 03/20/2013 1012   CHOLHDL 2 03/20/2013 1012   VLDL 4.8 03/20/2013 1012   LDLCALC 79 03/20/2013 1012      Wt Readings from Last 3 Encounters:  05/07/14 167 lb (75.751 kg)  03/06/14 165 lb 0.8 oz (74.866 kg)  11/07/13 164 lb 6.4 oz (74.571 kg)      Current medicines are reviewed  The patient understands his medications.     ASSESSMENT AND PLAN:

## 2014-05-07 NOTE — Assessment & Plan Note (Signed)
He has very mild valvular heart disease. This has not been participating in his shortness of breath.

## 2014-05-07 NOTE — Assessment & Plan Note (Signed)
We have been able to keep him on only a small dose of statin. There has been some question that his shortness of breath could be a muscular issue related to the statins. This has never been proven.

## 2014-05-07 NOTE — Assessment & Plan Note (Signed)
Workup of his shortness of breath has been extensive. In April, 2015 PFTs showed no marked abnormality. High resolution chest CT showed no significant interstitial lung disease. Cardiopulmonary exercise test in May, 2015 revealed no obvious cardiac or pulmonary basis for his shortness of breath. Follow-up 2-D echo in May, 2015 revealed continued normal LV function. There was a trial of maximum GERD therapy. This was stopped in October, 2015. The patient has a hiatal hernia and he wonders if this plays a role. His major symptom is having to intermittently take deep breaths. No further workup is recommended at this time.  The patient is aware that I am retiring. He has requested follow-up with [name]. I am in the process of arranging this.

## 2014-05-07 NOTE — Assessment & Plan Note (Signed)
He has very mild pectus excavatum. I do not know if this plays any role in his sensation of shortness of breath.

## 2014-05-29 ENCOUNTER — Encounter: Payer: Self-pay | Admitting: Physician Assistant

## 2014-05-29 ENCOUNTER — Telehealth: Payer: Self-pay | Admitting: *Deleted

## 2014-05-29 ENCOUNTER — Ambulatory Visit (INDEPENDENT_AMBULATORY_CARE_PROVIDER_SITE_OTHER): Payer: BLUE CROSS/BLUE SHIELD | Admitting: Physician Assistant

## 2014-05-29 VITALS — BP 118/78 | HR 62 | Ht 69.0 in | Wt 165.0 lb

## 2014-05-29 DIAGNOSIS — K5100 Ulcerative (chronic) pancolitis without complications: Secondary | ICD-10-CM

## 2014-05-29 DIAGNOSIS — K5190 Ulcerative colitis, unspecified, without complications: Secondary | ICD-10-CM

## 2014-05-29 DIAGNOSIS — Z7901 Long term (current) use of anticoagulants: Secondary | ICD-10-CM | POA: Diagnosis not present

## 2014-05-29 MED ORDER — SOD PHOS MONO-SOD PHOS DIBASIC 1.102-0.398 G PO TABS: ORAL_TABLET | ORAL | Status: DC

## 2014-05-29 MED ORDER — NA SULFATE-K SULFATE-MG SULF 17.5-3.13-1.6 GM/177ML PO SOLN: 1.0000 | Freq: Once | ORAL | Status: DC

## 2014-05-29 NOTE — Telephone Encounter (Signed)
05/29/2014   RE: Joseph Booker DOB: Jan 10, 1951 MRN: [id number]   Dear  Dr. Dola Argyle,    We have scheduled the above patient for an endoscopic procedure. Our records show that he is on anticoagulation therapy.   Please advise as to how long the patient may come off his therapy of  Plavix prior to the procedure, which is scheduled for  07-31-2014.  Please fax back/ or route the completed form to [place] at [phone].   Sincerely,    Amy Esterwood PA-C

## 2014-05-29 NOTE — Progress Notes (Signed)
Reviewed and agree with management. Robert D. Kaplan, M.D., FACG  

## 2014-05-29 NOTE — Progress Notes (Signed)
Patient ID: Joseph Booker, male   DOB: [birth year], 64 y.o.   MRN: [id number]   Subjective:    Patient ID: Joseph Booker, male    DOB: Dec 01, 1950, 64 y.o.   MRN: [id number]  HPI Joseph Booker is a 64 year old white male known to Dr. Deatra Ina with long-term universal ulcerative colitis diagnosed proximally 30 years ago. He has been asymptomatic for many years and maintained on Imuran and Pentasa. He comes in today to discuss recall colonoscopy for surveillance. Last colonoscopy was done in May 2014 and negative with no evidence of active colitis however biopsies did show focal active colitis. He had labs done in March 2016 with normal CBC and hepatic panel. Patient is on chronic Plavix with history of coronary artery disease status post remote stent in 2004. Last EF was 55%. He also has history of GERD. His cardiologist is Dr. caps.  Review of Systems Pertinent positive and negative review of systems were noted in the above HPI section.  All other review of systems was otherwise negative.  Outpatient Encounter Prescriptions as of 05/29/2014  Medication Sig  . aspirin 81 MG tablet Take 81 mg by mouth daily.    Marland Kitchen azaTHIOprine (IMURAN) 50 MG tablet TAKE THREE AND ONE-HALF TABLETS DAILY  . clopidogrel (PLAVIX) 75 MG tablet TAKE 1 TABLET DAILY  . Multiple Vitamin (MULTIVITAMIN) tablet Take 1 tablet by mouth daily.  Marland Kitchen PENTASA 500 MG CR capsule TAKE 2 CAPSULES (1 GRAM) FOUR TIMES A DAY  . simvastatin (ZOCOR) 10 MG tablet Take 1 tablet daily  . Travoprost, BAK Free, (TRAVATAN) 0.004 % SOLN ophthalmic solution Place 1 drop into both eyes at bedtime.  . sodium phosphates (OSMOPREP) 1.102-0.398 G TABS Take as directed  . [DISCONTINUED] Na Sulfate-K Sulfate-Mg Sulf SOLN Take 1 kit by mouth once. (Patient not taking: Reported on 05/29/2014)  . [DISCONTINUED] Olopatadine HCl 0.6 % SOLN 1 sprays each nostril twice per day   No facility-administered encounter medications on file as of 05/29/2014.   Allergies  Allergen  Reactions  . Dust Mite Extract    Patient Active Problem List   Diagnosis Date Noted  . Glaucoma 08/21/2013  . Mitral regurgitation 05/23/2013  . Ejection fraction   . Shortness of breath 05/13/2013  . 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 Pectus excavatum 05/13/2013  . Numbness and tingling in hands 05/13/2013  . Arm pain   . BPH (benign prostatic hypertrophy) 07/06/2011  . CAD (coronary artery disease)   . Hyperlipidemia 06/12/2009  . VITAMIN D DEFICIENCY 06/04/2009  . ANEMIA, IRON DEFICIENCY 06/04/2009  . ALLERGIC RHINITIS 06/04/2009  . GERD 06/04/2009  . ANXIETY STATE, UNSPECIFIED 05/15/2009  . OSTEOPENIA 05/15/2009  . ULCERATIVE COLITIS-UNIVERSAL 05/17/2007   History   Social History  . Marital Status: Married    Spouse Name: N/A  . Number of Children: 1  . Years of Education: N/A   Occupational History  . retired    Social History Main Topics  . Smoking status: Never Smoker   . Smokeless tobacco: Never Used     Comment: Married, 1 stepson, 5 g-kids. Retired Emergency planning/management officer, now Herbalist  . Alcohol Use: 0.6 oz/week    1 Glasses of wine per week     Comment: weekly  . Drug Use: No  . Sexual Activity:    Partners: Female   Other Topics Concern  . Not on file   Social History Narrative    Joseph Booker's family history includes Asthma in his sister; Diabetes in his mother;  Heart disease in his mother; Heart disease (age of onset: 63) in his father; Lung disease in his mother; Rheum arthritis in his mother.      Objective:    Filed Vitals:   05/29/14 0939  BP: 118/78  Pulse: 62    Physical Exam  well-developed white male in no acute distress, blood pressure 118/78 pulse 62 height 5 foot 9 weight 165. HEENT; nontraumatic normocephalic EOMI PERRLA sclera anicteric, Supple; no JVD, Cardiovascular; regular rate and rhythm with S1-S2 no murmur rub or gallop, Pulmonary; clear bilaterally, Abdomen; soft nontender nondistended bowel sounds are active there is no  palpable mass or hepatosplenomegaly bowel sounds are present, Rectal ;exam not done, Extremities ;no clubbing cyanosis or edema skin warm and dry, Psych; mood and affect appropriate       Assessment & Plan:   #1 64 yo male with long standing universal ulcerative colitis x 30 years ,asymptomatic on Imuran and pentasa-due for surveillance Colonoscopy #2 Cad -s/p remote stent 2004 #3 chronic  Antiplatelet therapy-Plavix and ASA #4 GERD #5 Hyperlipidemia  Plan; Will schedule for Colonoscopy with Dr. Deatra Ina -Procedure discussed in detail with pt and he is agreeable  to proceed- Pt refuses to drink any sort of liquid prep , and has used Osmo prep in the past .Will use Osmo prep, waiver signed Continue Imuran 150 mg daily, and pentasa 500 mg ,two tabs qid Serial labs q 6 months  Will communicate with his cardiologist Dr Ron Parker, to assure that holding Plavix for 5-6 days prior to procedure is reasonable for this pt        Amy Genia Harold PA-C 05/29/2014   Cc: Rowe Clack, MD

## 2014-05-29 NOTE — Patient Instructions (Signed)
You have been scheduled for an endoscopy and colonoscopy. Please follow the written instructions given to you at your visit today. Please pick up your prep supplies at the pharmacy within the next 1-3 days. [facility] [address]  If you use inhalers (even only as needed), please bring them with you on the day of your procedure. Your physician has requested that you go to www.startemmi.com and enter the access code given to you at your visit today. This web site gives a general overview about your procedure. However, you should still follow specific instructions given to you by our office regarding your preparation for the procedure.

## 2014-05-30 NOTE — Telephone Encounter (Signed)
Plavix can be held for 5 days before the procedure

## 2014-06-02 NOTE — Telephone Encounter (Signed)
Just want to make sure you saw this

## 2014-06-03 ENCOUNTER — Encounter: Payer: Self-pay | Admitting: Gastroenterology

## 2014-06-03 NOTE — Telephone Encounter (Signed)
Called patient and LM on the home answering device. Message left: "Regarding the Plavix clearance instructions from Dr. Ron Parker", " You can stop the Plavix on 07-26-2014 and resume it on 08-01-2014 ( day after the colonoscopy)."

## 2014-07-05 ENCOUNTER — Encounter: Payer: Self-pay | Admitting: Cardiology

## 2014-07-07 ENCOUNTER — Other Ambulatory Visit: Payer: Self-pay

## 2014-07-07 MED ORDER — CLOPIDOGREL BISULFATE 75 MG PO TABS: ORAL_TABLET | ORAL | Status: DC

## 2014-07-31 ENCOUNTER — Ambulatory Visit (AMBULATORY_SURGERY_CENTER): Payer: BLUE CROSS/BLUE SHIELD | Admitting: Gastroenterology

## 2014-07-31 ENCOUNTER — Encounter: Payer: Self-pay | Admitting: Gastroenterology

## 2014-07-31 VITALS — BP 108/72 | HR 42 | Temp 97.0°F | Resp 35 | Ht 69.0 in | Wt 165.0 lb

## 2014-07-31 DIAGNOSIS — Z1211 Encounter for screening for malignant neoplasm of colon: Secondary | ICD-10-CM | POA: Diagnosis not present

## 2014-07-31 DIAGNOSIS — K529 Noninfective gastroenteritis and colitis, unspecified: Secondary | ICD-10-CM | POA: Diagnosis not present

## 2014-07-31 DIAGNOSIS — K5190 Ulcerative colitis, unspecified, without complications: Secondary | ICD-10-CM | POA: Diagnosis not present

## 2014-07-31 MED ORDER — SODIUM CHLORIDE 0.9 % IV SOLN: 500.0000 mL | INTRAVENOUS | Status: DC

## 2014-07-31 NOTE — Op Note (Signed)
[facility]  [address]. [place], [phone]   COLONOSCOPY PROCEDURE REPORT  PATIENT: Joseph Booker, Joseph Booker  MR#: [id number] BIRTHDATE: October 04, 1950 , 64  yrs. old GENDER: male ENDOSCOPIST: Inda Castle, MD REFERRED CH:ENIDPOE Asa Lente, M.D. PROCEDURE DATE:  07/31/2014 PROCEDURE:   Colonoscopy, surveillance First Screening Colonoscopy - Avg.  risk and is 50 yrs.  old or older - No.  Prior Negative Screening - Now for repeat screening. Other: See Comments  History of Adenoma - Now for follow-up colonoscopy & has been > or = to 3 yrs.  N/A  Polyps removed today? Yes ASA CLASS:   Class II INDICATIONS:Inflammatory Bowel Disease (at least 8 years pancolitis or 15 years left sided colitis). MEDICATIONS: Monitored anesthesia care and Propofol 230 mg IV  DESCRIPTION OF PROCEDURE:   After the risks benefits and alternatives of the procedure were thoroughly explained, informed consent was obtained.  The digital rectal exam revealed no abnormalities of the rectum.   The LB [id number]  endoscope was introduced through the anus and advanced to the cecum, which was identified by both the appendix and ileocecal valve. No adverse events experienced.   The quality of the prep was (Suprep was used) excellent.  The instrument was then slowly withdrawn as the colon was fully examined. Estimated blood loss is zero unless otherwise noted in this procedure report.      COLON FINDINGS: A normal appearing cecum, ileocecal valve, and appendiceal orifice were identified.  The ascending, transverse, descending, sigmoid colon, and rectum appeared unremarkable. Retroflexed views revealed no abnormalities. Random biopsies were taken throughout the colon to rule out dysplasia. The time to cecum = 7.0 Withdrawal time = 8.9   The scope was withdrawn and the procedure completed. COMPLICATIONS: There were no immediate complications.  ENDOSCOPIC IMPRESSION: Normal  colonoscopy  RECOMMENDATIONS: colonoscopy 5 years  eSigned:  Inda Castle, MD 07/31/2014 3:19 PM   cc:

## 2014-07-31 NOTE — Progress Notes (Signed)
Called to room to assist during endoscopic procedure.  Patient ID and intended procedure confirmed with present staff. Received instructions for my participation in the procedure from the performing physician.  

## 2014-07-31 NOTE — Progress Notes (Signed)
Report to PACU, RN, vss, BBS= Clear.  

## 2014-07-31 NOTE — Patient Instructions (Addendum)
YOU HAD AN ENDOSCOPIC PROCEDURE TODAY AT [facility]:   Refer to the procedure report that was given to you for any specific questions about what was found during the examination.  If the procedure report does not answer your questions, please call your gastroenterologist to clarify.  If you requested that your care partner not be given the details of your procedure findings, then the procedure report has been included in a sealed envelope for you to review at your convenience later.  YOU SHOULD EXPECT: Some feelings of bloating in the abdomen. Passage of more gas than usual.  Walking can help get rid of the air that was put into your GI tract during the procedure and reduce the bloating. If you had a lower endoscopy (such as a colonoscopy or flexible sigmoidoscopy) you may notice spotting of blood in your stool or on the toilet paper. If you underwent a bowel prep for your procedure, you may not have a normal bowel movement for a few days.  Please Note:  You might notice some irritation and congestion in your nose or some drainage.  This is from the oxygen used during your procedure.  There is no need for concern and it should clear up in a day or so.  SYMPTOMS TO REPORT IMMEDIATELY:   Following lower endoscopy (colonoscopy or flexible sigmoidoscopy):  Excessive amounts of blood in the stool  Significant tenderness or worsening of abdominal pains  Swelling of the abdomen that is new, acute  Fever of 100F or higher   For urgent or emergent issues, a gastroenterologist can be reached at any hour by calling [phone].   DIET: Your first meal following the procedure should be a small meal and then it is ok to progress to your normal diet. Heavy or fried foods are harder to digest and may make you feel nauseous or bloated.  Likewise, meals heavy in dairy and vegetables can increase bloating.  Drink plenty of fluids but you should avoid alcoholic beverages for 24  hours.  ACTIVITY:  You should plan to take it easy for the rest of today and you should NOT DRIVE or use heavy machinery until tomorrow (because of the sedation medicines used during the test).    FOLLOW UP: Our staff will call the number listed on your records the next business day following your procedure to check on you and address any questions or concerns that you may have regarding the information given to you following your procedure. If we do not reach you, we will leave a message.  However, if you are feeling well and you are not experiencing any problems, there is no need to return our call.  We will assume that you have returned to your regular daily activities without incident.  If any biopsies were taken you will be contacted by phone or by letter within the next 1-3 weeks.  Please call us at [phone] if you have not heard about the biopsies in 3 weeks.   Resume Plavix in 72 hours   SIGNATURES/CONFIDENTIALITY: You and/or your care partner have signed paperwork which will be entered into your electronic medical record.  These signatures attest to the fact that that the information above on your After Visit Summary has been reviewed and is understood.  Full responsibility of the confidentiality of this discharge information lies with you and/or your care-partner.  Start your plavix again on Sunday per Dr. Deatra Ina.

## 2014-08-01 ENCOUNTER — Telehealth: Payer: Self-pay | Admitting: *Deleted

## 2014-08-01 NOTE — Telephone Encounter (Signed)
  Follow up Call-  Call back number 07/31/2014 05/17/2012  Post procedure Call Back phone  # [phone]  Permission to leave phone message Yes Yes     Patient questions:  Do you have a fever, pain , or abdominal swelling? No. Pain Score  0 *  Have you tolerated food without any problems? Yes.    Have you been able to return to your normal activities? Yes.    Do you have any questions about your discharge instructions: Diet   No. Medications  No. Follow up visit  No.  Do you have questions or concerns about your Care? No.  Actions: * If pain score is 4 or above: No action needed, pain <4.

## 2014-08-06 ENCOUNTER — Encounter: Payer: Self-pay | Admitting: Gastroenterology

## 2014-08-08 ENCOUNTER — Encounter: Payer: Self-pay | Admitting: Gastroenterology

## 2014-08-27 ENCOUNTER — Encounter: Payer: Self-pay | Admitting: Cardiology

## 2014-09-08 ENCOUNTER — Telehealth: Payer: Self-pay | Admitting: Cardiology

## 2014-09-08 NOTE — Telephone Encounter (Signed)
The pt is advised that Dr Ron Parker will return to the office on Friday (8/26) and that I will give him the paperwork at that time. The pt is requesting that the paperwork be filled out ASAP as he states he dropped it off over a month ago.  Will forward message to Dr Ron Parker as Juluis Rainier.

## 2014-09-08 NOTE — Telephone Encounter (Signed)
New message      Pt checking to see if Dr Ron Parker has filled out and sent back in his disability paperwork Please call to discuss.  If unable to reach on home number, please call [phone]

## 2014-09-12 NOTE — Telephone Encounter (Signed)
The pt is advised that Dr Ron Parker has completed his disability paperwork and that he can pick them up today. The pt is requesting that we send the paperwork back to the [place] office that he dropped them off at and that he will pick them up on Monday 09/15/14.

## 2014-12-04 ENCOUNTER — Ambulatory Visit (INDEPENDENT_AMBULATORY_CARE_PROVIDER_SITE_OTHER): Payer: BLUE CROSS/BLUE SHIELD

## 2014-12-04 DIAGNOSIS — Z23 Encounter for immunization: Secondary | ICD-10-CM | POA: Diagnosis not present

## 2014-12-31 ENCOUNTER — Encounter: Payer: Self-pay | Admitting: Internal Medicine

## 2014-12-31 ENCOUNTER — Ambulatory Visit (INDEPENDENT_AMBULATORY_CARE_PROVIDER_SITE_OTHER): Payer: BLUE CROSS/BLUE SHIELD | Admitting: Internal Medicine

## 2014-12-31 VITALS — BP 144/88 | HR 67 | Temp 97.8°F | Resp 16 | Wt 154.0 lb

## 2014-12-31 DIAGNOSIS — K5190 Ulcerative colitis, unspecified, without complications: Secondary | ICD-10-CM | POA: Diagnosis not present

## 2014-12-31 DIAGNOSIS — M8580 Other specified disorders of bone density and structure, unspecified site: Secondary | ICD-10-CM

## 2014-12-31 DIAGNOSIS — Z0000 Encounter for general adult medical examination without abnormal findings: Secondary | ICD-10-CM | POA: Diagnosis not present

## 2014-12-31 DIAGNOSIS — N400 Enlarged prostate without lower urinary tract symptoms: Secondary | ICD-10-CM | POA: Diagnosis not present

## 2014-12-31 NOTE — Assessment & Plan Note (Signed)
We'll check PSA Exam shows a slightly enlarged prostate, no nodules Is not interested in medication at this time, but will let me know if he changes his mind

## 2014-12-31 NOTE — Assessment & Plan Note (Signed)
Currently controlled, in clinical remission Following with gastroenterology Currently taking Pentasa

## 2014-12-31 NOTE — Progress Notes (Signed)
Subjective:    Patient ID: Joseph Booker, male    DOB: Oct 30, 1950, 64 y.o.   MRN: [id number]  HPI He is here to establish with a new pcp.   He is here for a physical.  He has several chronic complaints that have been evaluated in the past. He states he has been fatigued for years. He wonders if this is related to his ulcerative colitis. His ulcerative colitis has been controlled for years.  Breathing problems: A few years ago he started experiencing breathing problems. If he tries to take a deep breath sometimes he is not able to fill his lungs completely. He does have a large hiatal hernia and wonders if it is related to that. This can occur at rest or with activity. He also notes that sometimes when she is speaking he runs out of the ear and needs to stop and take breath he thinks this occurs more when he smells more intense scents such as perfume or chlorine. He wonders if this could be related to allergies. He has seen several specialists, including pulmonary, and no explanation for the symptoms was found.  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 Started a couple of years ago. He again has seen neurology and other specialists and no cause was found.  ? blood sugar problem:  He has freuqnt hunger, thirst and urination.  Also the neuropathy-so he wonders if he may have a sugar problem. He has never had an abnormal sugar, but it has been a long time since an A1c was checked.Marland Kitchen  He does have diabetes in both sides of his family.  Coronary artery disease: He had a stent placed in his LAD. He does follow with cardiology regularly. He is taking all his medications as prescribed. On occasion he still experiencing some chest pain, but evaluation of his heart has revealed no ischemia. He exercises regularly and denies any pain with exercise.   Medications and allergies reviewed with patient and updated if appropriate.  Patient Active Problem List   Diagnosis Date Noted  .  Glaucoma 08/21/2013  . Mitral regurgitation 05/23/2013  . Ejection fraction   . Shortness of breath 05/13/2013  . Dilated aortic root ([place]) 05/13/2013  . Pectus excavatum 05/13/2013  . Numbness and tingling in hands 05/13/2013  . Arm pain   . BPH (benign prostatic hypertrophy) 07/06/2011  . CAD (coronary artery disease)   . Hyperlipidemia 06/12/2009  . VITAMIN D DEFICIENCY 06/04/2009  . ANEMIA, IRON DEFICIENCY 06/04/2009  . ALLERGIC RHINITIS 06/04/2009  . GERD 06/04/2009  . ANXIETY STATE, UNSPECIFIED 05/15/2009  . OSTEOPENIA 05/15/2009  . ULCERATIVE COLITIS-UNIVERSAL 05/17/2007    Current Outpatient Prescriptions on File Prior to Visit  Medication Sig Dispense Refill  . aspirin 81 MG tablet Take 81 mg by mouth daily.      . clopidogrel (PLAVIX) 75 MG tablet TAKE 1 TABLET DAILY 90 tablet 2  . Multiple Vitamin (MULTIVITAMIN) tablet Take 1 tablet by mouth daily.    Marland Kitchen PENTASA 500 MG CR capsule TAKE 2 CAPSULES (1 GRAM) FOUR TIMES A DAY 720 capsule 2  . simvastatin (ZOCOR) 10 MG tablet Take 1 tablet daily 90 tablet 4   No current facility-administered medications on file prior to visit.    Past Medical History  Diagnosis Date  . Osteopenia   . Esophagitis   . CAD (coronary artery disease)     [name] / nuclear 2005, excellent tolerance no scar or ischemia, EF 51% /  nuclear, February, 2012, no scar or ischemia / catheterization March 26, 2010.. 10% in-stent restenosis, minimal other nonobstructive coronary disease, ejection fraction 55%., excellent result; ETT 9/13: ex 13:14, no CP, no ischemic ECG changes  . INGUINAL HERNIA   . OSTEOPENIA   . VITAMIN D DEFICIENCY   . ALLERGIC RHINITIS   . ANEMIA, IRON DEFICIENCY   . Anxiety state, unspecified   . ULCERATIVE COLITIS-UNIVERSAL   . Hyperlipidemia   . GERD (gastroesophageal reflux disease)   . Arm pain     Right arm discomfort with some radiation from the biceps down to the wrist..  January, 2014  . Ejection  fraction   . Glaucoma     Past Surgical History  Procedure Laterality Date  . Angioplasty      stent 2004  . Hernia repair      2006  . Tonsillectomy      Social History   Social History  . Marital Status: Married    Spouse Name: N/A  . Number of Children: 1  . Years of Education: N/A   Occupational History  . retired    Social History Main Topics  . Smoking status: Never Smoker   . Smokeless tobacco: Never Used     Comment: Married, 1 stepson, 5 g-kids. Retired Emergency planning/management officer, now Herbalist  . Alcohol Use: 0.6 oz/week    1 Glasses of wine per week     Comment: weekly  . Drug Use: No  . Sexual Activity:    Partners: Female   Other Topics Concern  . None   Social History Narrative    Review of Systems  Constitutional: Positive for fatigue. Negative for fever, chills, appetite change and unexpected weight change.  HENT: Positive for hearing loss (from flying an airplane).   Eyes: Positive for visual disturbance (occasional).  Respiratory: Positive for shortness of breath (cant dep breath at times). Negative for cough and wheezing.   Cardiovascular: Positive for chest pain (occasional). Negative for leg swelling. Palpitations: rare.  Gastrointestinal: Negative for nausea, abdominal pain, diarrhea, constipation and blood in stool.       Rare GERD  Endocrine: Positive for polydipsia and polyuria.  Genitourinary: Negative for dysuria, hematuria and difficulty urinating.  Musculoskeletal: Negative for myalgias, back pain and arthralgias.  Neurological: Positive for numbness (altered sensation - toes, fingers, lips). Negative for dizziness, light-headedness and headaches.  Psychiatric/Behavioral: Negative for sleep disturbance and dysphoric mood. The patient is nervous/anxious (mild).        Objective:   Filed Vitals:   12/31/14 1309  BP: 144/88  Pulse: 67  Temp: 97.8 F (36.6 C)  Resp: 16   Filed Weights   12/31/14 1309  Weight: 154 lb (69.854 kg)    Body mass index is 22.73 kg/(m^2). sometimes checks BP - usually controlled.    Physical Exam  Constitutional: He appears well-developed and well-nourished. No distress.  HENT:  Head: Normocephalic and atraumatic.  Right Ear: External ear normal.  Left Ear: External ear normal.  Mouth/Throat: Oropharynx is clear and moist. No oropharyngeal exudate.  Eyes: Conjunctivae are normal.  Neck: No tracheal deviation present. No thyromegaly present.  No carotid bruit  Cardiovascular: Normal rate, regular rhythm and normal heart sounds.   No murmur heard. Pulmonary/Chest: Effort normal and breath sounds normal. No respiratory distress. He has no wheezes. He has no rales.  Abdominal: He exhibits no distension. There is no tenderness.  Genitourinary: Rectum normal.  Prostate slightly enlarged, but smooth without nodules  Musculoskeletal:  He exhibits no edema.  Lymphadenopathy:    He has no cervical adenopathy.  Skin: Skin is warm. He is not diaphoretic.  Psychiatric: He has a normal mood and affect. His behavior is normal.          Assessment & Plan:    Preventive exam Screening blood work ordered, consented to hepatitis C testing Eye exam up to date Colonoscopy up to date Discussed shingles vaccine-he will think about this, and discussed with his gastroenterologist Other vaccines up-to-date Continue regular exercise Continue heart healthy diet No skin concerns Discussed that his prostate is slightly enlarged. We'll check PSA. Discussed that if his symptoms worsen and he wants to consider medication to let me know-we will refer to urology. He does not feel that he needs medication at this time.  See assessment and plan for review of chronic medical problems

## 2014-12-31 NOTE — Assessment & Plan Note (Signed)
Will check vitamin D level Continue regular exercise

## 2014-12-31 NOTE — Patient Instructions (Signed)
We have reviewed your prior records including labs and tests today.  Test(s) ordered today. Your results will be released to Joseph Booker (or called to you) after review, usually within 72hours after test completion. If any changes need to be made, you will be notified at that same time.  All other Health Maintenance issues reviewed.   All recommended immunizations and age-appropriate screenings are up-to-date.  No immunizations administered today.   Medications reviewed and updated.  No changes recommended at this time.  Health Maintenance, Male A healthy lifestyle and preventative care can promote health and wellness.  Maintain regular health, dental, and eye exams.  Eat a healthy diet. Foods like vegetables, fruits, whole grains, low-fat dairy products, and lean protein foods contain the nutrients you need and are low in calories. Decrease your intake of foods high in solid fats, added sugars, and salt. Get information about a proper diet from your health care provider, if necessary.  Regular physical exercise is one of the most important things you can do for your health. Most adults should get at least 150 minutes of moderate-intensity exercise (any activity that increases your heart rate and causes you to sweat) each week. In addition, most adults need muscle-strengthening exercises on 2 or more days a week.   Maintain a healthy weight. The body mass index (BMI) is a screening tool to identify possible weight problems. It provides an estimate of body fat based on height and weight. Your health care provider can find your BMI and can help you achieve or maintain a healthy weight. For males 20 years and older:  A BMI below 18.5 is considered underweight.  A BMI of 18.5 to 24.9 is normal.  A BMI of 25 to 29.9 is considered overweight.  A BMI of 30 and above is considered obese.  Maintain normal blood lipids and cholesterol by exercising and minimizing your intake of saturated fat. Eat a  balanced diet with plenty of fruits and vegetables. Blood tests for lipids and cholesterol should begin at age 64 and be repeated every 5 years. If your lipid or cholesterol levels are high, you are over age 19, or you are at high risk for heart disease, you may need your cholesterol levels checked more frequently.Ongoing high lipid and cholesterol levels should be treated with medicines if diet and exercise are not working.  If you smoke, find out from your health care provider how to quit. If you do not use tobacco, do not start.  Lung cancer screening is recommended for adults aged 9-80 years who are at high risk for developing lung cancer because of a history of smoking. A yearly low-dose CT scan of the lungs is recommended for people who have at least a 30-pack-year history of smoking and are current smokers or have quit within the past 15 years. A pack year of smoking is smoking an average of 1 pack of cigarettes a day for 1 year (for example, a 30-pack-year history of smoking could mean smoking 1 pack a day for 30 years or 2 packs a day for 15 years). Yearly screening should continue until the smoker has stopped smoking for at least 15 years. Yearly screening should be stopped for people who develop a health problem that would prevent them from having lung cancer treatment.  If you choose to drink alcohol, do not have more than 2 drinks per day. One drink is considered to be 12 oz (360 mL) of beer, 5 oz (150 mL) of wine, or 1.5  oz (45 mL) of liquor.  Avoid the use of street drugs. Do not share needles with anyone. Ask for help if you need support or instructions about stopping the use of drugs.  High blood pressure causes heart disease and increases the risk of stroke. High blood pressure is more likely to develop in:  People who have blood pressure in the end of the normal range (100-139/85-89 mm Hg).  People who are overweight or obese.  People who are African American.  If you are 51-42  years of age, have your blood pressure checked every 3-5 years. If you are 61 years of age or older, have your blood pressure checked every year. You should have your blood pressure measured twice--once when you are at a hospital or clinic, and once when you are not at a hospital or clinic. Record the average of the two measurements. To check your blood pressure when you are not at a hospital or clinic, you can use:  An automated blood pressure machine at a pharmacy.  A home blood pressure monitor.  If you are 68-4 years old, ask your health care provider if you should take aspirin to prevent heart disease.  Diabetes screening involves taking a blood sample to check your fasting blood sugar level. This should be done once every 3 years after age 57 if you are at a normal weight and without risk factors for diabetes. Testing should be considered at a younger age or be carried out more frequently if you are overweight and have at least 1 risk factor for diabetes.  Colorectal cancer can be detected and often prevented. Most routine colorectal cancer screening begins at the age of 64 and continues through age 50. However, your health care provider may recommend screening at an earlier age if you have risk factors for colon cancer. On a yearly basis, your health care provider may provide home test kits to check for hidden blood in the stool. A small camera at the end of a tube may be used to directly examine the colon (sigmoidoscopy or colonoscopy) to detect the earliest forms of colorectal cancer. Talk to your health care provider about this at age 79 when routine screening begins. A direct exam of the colon should be repeated every 5-10 years through age 52, unless early forms of precancerous polyps or small growths are found.  People who are at an increased risk for hepatitis B should be screened for this virus. You are considered at high risk for hepatitis B if:  You were born in a country where  hepatitis B occurs often. Talk with your health care provider about which countries are considered high risk.  Your parents were born in a high-risk country and you have not received a shot to protect against hepatitis B (hepatitis B vaccine).  You have HIV or AIDS.  You use needles to inject street drugs.  You live with, or have sex with, someone who has hepatitis B.  You are a man who has sex with other men (MSM).  You get hemodialysis treatment.  You take certain medicines for conditions like cancer, organ transplantation, and autoimmune conditions.  Hepatitis C blood testing is recommended for all people born from [birth year] through 1963 and any individual with known risk factors for hepatitis C.  Healthy men should no longer receive prostate-specific antigen (PSA) blood tests as part of routine cancer screening. Talk to your health care provider about prostate cancer screening.  Testicular cancer screening is not recommended  for adolescents or adult males who have no symptoms. Screening includes self-exam, a health care provider exam, and other screening tests. Consult with your health care provider about any symptoms you have or any concerns you have about testicular cancer.  Practice safe sex. Use condoms and avoid high-risk sexual practices to reduce the spread of sexually transmitted infections (STIs).  You should be screened for STIs, including gonorrhea and chlamydia if:  You are sexually active and are younger than 24 years.  You are older than 24 years, and your health care provider tells you that you are at risk for this type of infection.  Your sexual activity has changed since you were last screened, and you are at an increased risk for chlamydia or gonorrhea. Ask your health care provider if you are at risk.  If you are at risk of being infected with HIV, it is recommended that you take a prescription medicine daily to prevent HIV infection. This is called pre-exposure  prophylaxis (PrEP). You are considered at risk if:  You are a man who has sex with other men (MSM).  You are a heterosexual man who is sexually active with multiple partners.  You take drugs by injection.  You are sexually active with a partner who has HIV.  Talk with your health care provider about whether you are at high risk of being infected with HIV. If you choose to begin PrEP, you should first be tested for HIV. You should then be tested every 3 months for as long as you are taking PrEP.  Use sunscreen. Apply sunscreen liberally and repeatedly throughout the day. You should seek shade when your shadow is shorter than you. Protect yourself by wearing long sleeves, pants, a wide-brimmed hat, and sunglasses year round whenever you are outdoors.  Tell your health care provider of new moles or changes in moles, especially if there is a change in shape or color. Also, tell your health care provider if a mole is larger than the size of a pencil eraser.  A one-time screening for abdominal aortic aneurysm (AAA) and surgical repair of large AAAs by ultrasound is recommended for men aged 72-75 years who are current or former smokers.  Stay current with your vaccines (immunizations).   This information is not intended to replace advice given to you by your health care provider. Make sure you discuss any questions you have with your health care provider.   Document Released: 07/02/2007 Document Revised: 01/24/2014 Document Reviewed: 05/31/2010 Elsevier Interactive Patient Education [employer].

## 2014-12-31 NOTE — Progress Notes (Signed)
Pre visit review using our clinic review tool, if applicable. No additional management support is needed unless otherwise documented below in the visit note. 

## 2015-01-01 ENCOUNTER — Ambulatory Visit (INDEPENDENT_AMBULATORY_CARE_PROVIDER_SITE_OTHER): Payer: BLUE CROSS/BLUE SHIELD | Admitting: Gastroenterology

## 2015-01-01 ENCOUNTER — Encounter: Payer: Self-pay | Admitting: Gastroenterology

## 2015-01-01 ENCOUNTER — Other Ambulatory Visit (INDEPENDENT_AMBULATORY_CARE_PROVIDER_SITE_OTHER): Payer: BLUE CROSS/BLUE SHIELD

## 2015-01-01 VITALS — BP 112/76 | HR 64 | Ht 69.0 in | Wt 154.2 lb

## 2015-01-01 DIAGNOSIS — Z0000 Encounter for general adult medical examination without abnormal findings: Secondary | ICD-10-CM | POA: Diagnosis not present

## 2015-01-01 DIAGNOSIS — K5100 Ulcerative (chronic) pancolitis without complications: Secondary | ICD-10-CM | POA: Diagnosis not present

## 2015-01-01 LAB — CBC WITH DIFFERENTIAL/PLATELET
Basophils Absolute: 0.1 10*3/uL (ref 0.0–0.1)
Basophils Relative: 1 % (ref 0.0–3.0)
EOS PCT: 2.4 % (ref 0.0–5.0)
Eosinophils Absolute: 0.1 10*3/uL (ref 0.0–0.7)
HCT: 48.5 % (ref 39.0–52.0)
Hemoglobin: 16.4 g/dL (ref 13.0–17.0)
Lymphocytes Relative: 28.7 % (ref 12.0–46.0)
Lymphs Abs: 1.5 10*3/uL (ref 0.7–4.0)
MCHC: 33.7 g/dL (ref 30.0–36.0)
MCV: 97.3 fl (ref 78.0–100.0)
MONOS PCT: 10.2 % (ref 3.0–12.0)
Monocytes Absolute: 0.5 10*3/uL (ref 0.1–1.0)
NEUTROS PCT: 57.7 % (ref 43.0–77.0)
Neutro Abs: 3 10*3/uL (ref 1.4–7.7)
Platelets: 190 10*3/uL (ref 150.0–400.0)
RBC: 4.99 Mil/uL (ref 4.22–5.81)
RDW: 13.5 % (ref 11.5–15.5)
WBC: 5.2 10*3/uL (ref 4.0–10.5)

## 2015-01-01 LAB — COMPREHENSIVE METABOLIC PANEL
ALT: 29 U/L (ref 0–53)
AST: 37 U/L (ref 0–37)
Albumin: 4.4 g/dL (ref 3.5–5.2)
Alkaline Phosphatase: 60 U/L (ref 39–117)
BUN: 22 mg/dL (ref 6–23)
CHLORIDE: 107 meq/L (ref 96–112)
CO2: 27 meq/L (ref 19–32)
Calcium: 9.7 mg/dL (ref 8.4–10.5)
Creatinine, Ser: 1.17 mg/dL (ref 0.40–1.50)
GFR: 66.98 mL/min (ref >60.00)
GLUCOSE: 94 mg/dL (ref 70–99)
POTASSIUM: 4.7 meq/L (ref 3.5–5.1)
SODIUM: 141 meq/L (ref 135–145)
Total Bilirubin: 0.8 mg/dL (ref 0.2–1.2)
Total Protein: 7.6 g/dL (ref 6.0–8.3)

## 2015-01-01 LAB — LIPID PANEL
CHOL/HDL RATIO: 3
Cholesterol: 166 mg/dL (ref 0–200)
HDL: 54.9 mg/dL (ref >39.00)
LDL Cholesterol: 100 mg/dL — ABNORMAL HIGH (ref 0–99)
NONHDL: 110.74
Triglycerides: 55 mg/dL (ref 0.0–149.0)
VLDL: 11 mg/dL (ref 0.0–40.0)

## 2015-01-01 LAB — HEMOGLOBIN A1C: HEMOGLOBIN A1C: 5.3 % (ref 4.6–6.5)

## 2015-01-01 LAB — VITAMIN B12: VITAMIN B 12: 692 pg/mL (ref 211–911)

## 2015-01-01 LAB — TSH: TSH: 1.24 u[IU]/mL (ref 0.35–4.50)

## 2015-01-01 MED ORDER — MESALAMINE ER 500 MG PO CPCR
ORAL_CAPSULE | ORAL | Status: DC
Start: 2015-01-01 — End: 2015-03-16

## 2015-01-01 NOTE — Patient Instructions (Addendum)
Follow up in one year Refills of Pentasa sent to [facility]

## 2015-01-01 NOTE — Progress Notes (Signed)
Joseph Booker    [id number]    1950/09/29  Primary Care Physician:Stacy Lorretta Harp, MD  Referring Physician: Rowe Clack, MD [address] [place], [place] [address]  Chief complaint:   Ulcerative colitis HPI:  64 year old male with history of ulcerative colitis diagnosed about 20 years ago here for follow-up visit.  He is been in remission for past 10 years on maintenance dose of Pentasa and Imuran.  He recently stopped Imuran about 2 months ago because he thought it may be playing a role  with his recent symptoms of difficulty breathing.  He has had extensive evaluation by pulmonologist and cardiologist and so far workup is unrevealing for any significant lung pathology.  He has no GI symptoms, Denies any nausea, vomiting, abdominal pain, melena or bright red blood per rectum. Last colonoscopy in July 2016 was normal and random biopsies showed inactive colitis.    Outpatient Encounter Prescriptions as of 01/01/2015  Medication Sig  . aspirin 81 MG tablet Take 81 mg by mouth daily.    . clopidogrel (PLAVIX) 75 MG tablet TAKE 1 TABLET DAILY  . Coenzyme Q10 (CO Q-10) 300 MG CAPS Take by mouth daily.  Marland Kitchen latanoprost (XALATAN) 0.005 % ophthalmic solution Place 1 drop into both eyes at bedtime.  . mesalamine (PENTASA) 500 MG CR capsule TAKE 2 CAPSULES (1 GRAM) FOUR TIMES A DAY  . Multiple Vitamin (MULTIVITAMIN) tablet Take 1 tablet by mouth daily.  . simvastatin (ZOCOR) 10 MG tablet Take 1 tablet daily  . [DISCONTINUED] PENTASA 500 MG CR capsule TAKE 2 CAPSULES (1 GRAM) FOUR TIMES A DAY   No facility-administered encounter medications on file as of 01/01/2015.    Allergies as of 01/01/2015 - Review Complete 01/01/2015  Allergen Reaction Noted  . Dust mite extract  03/09/2014    Past Medical History  Diagnosis Date  . Osteopenia   . Esophagitis   . CAD (coronary artery disease)     [name] / nuclear 2005, excellent tolerance  no scar or ischemia, EF 51% / nuclear, February, 2012, no scar or ischemia / catheterization March 26, 2010.. 10% in-stent restenosis, minimal other nonobstructive coronary disease, ejection fraction 55%., excellent result; ETT 9/13: ex 13:14, no CP, no ischemic ECG changes  . INGUINAL HERNIA   . OSTEOPENIA   . VITAMIN D DEFICIENCY   . ALLERGIC RHINITIS   . ANEMIA, IRON DEFICIENCY   . Anxiety state, unspecified   . ULCERATIVE COLITIS-UNIVERSAL   . Hyperlipidemia   . GERD (gastroesophageal reflux disease)   . Arm pain     Right arm discomfort with some radiation from the biceps down to the wrist..  January, 2014  . Ejection fraction   . Glaucoma     Past Surgical History  Procedure Laterality Date  . Angioplasty  2004    stent 2004  . Abdominal hernia repair  2006  . Tonsillectomy    . Rhinoseptoplasty      Family History  Problem Relation Age of Onset  . Diabetes Mother   . Heart disease Mother   . Lung disease Mother     ILD  . Rheum arthritis Mother   . Heart disease Father 81    MI  . Asthma Sister   . Colon cancer Neg Hx   . Esophageal cancer Neg Hx   . Pancreatic cancer Neg Hx   . Kidney disease Neg Hx   .  Liver disease Neg Hx     Social History   Social History  . Marital Status: Married    Spouse Name: N/A  . Number of Children: 1  . Years of Education: N/A   Occupational History  . retired    Social History Main Topics  . Smoking status: Never Smoker   . Smokeless tobacco: Never Used     Comment: Married, 1 stepson, 5 g-kids. Retired Emergency planning/management officer, now Herbalist  . Alcohol Use: 0.6 oz/week    1 Glasses of wine per week     Comment: weekly at most  . Drug Use: No  . Sexual Activity:    Partners: Female   Other Topics Concern  . Not on file   Social History Narrative   Exercising regularly: walks at least 5 miles a day, 6 days a week      Review of systems: Review of Systems  Constitutional: Negative for fever and chills.  HENT:  Negative.   Eyes: Negative for blurred vision.  Respiratory: Negative for cough, shortness of breath and wheezing.   Cardiovascular: Negative for chest pain and palpitations.  Gastrointestinal: as per HPI Genitourinary: Negative for dysuria, urgency, frequency and hematuria.  Musculoskeletal: Negative for myalgias, back pain and joint pain.  Skin: Negative for itching and rash.  Neurological: Negative for dizziness, tremors, focal weakness, seizures and loss of consciousness.  Endo/Heme/Allergies: Negative for environmental allergies.  Psychiatric/Behavioral: Negative for depression, suicidal ideas and hallucinations.  All other systems reviewed and are negative.   Physical Exam: Filed Vitals:   01/01/15 1041  BP: 112/76  Pulse: 64   Gen:      No acute distress HEENT:  EOMI, sclera anicteric Neck:     No masses; no thyromegaly Lungs:    Clear to auscultation bilaterally; normal respiratory effort CV:         Regular rate and rhythm; no murmurs Abd:      + bowel sounds; soft, non-tender; no palpable masses, no distension Ext:    No edema; adequate peripheral perfusion Skin:      Warm and dry; no rash Neuro: alert and oriented x 3 Psych: normal mood and affect  Data Reviewed:   colonoscopy July 2016 : normal  COLONIC MUCOSA (9 FRAGMENTS) WITH FOCAL CHRONIC INACTIVE COLITIS - NEGATIVE FOR DYSPLASIA OR MALIGNANCY  Assessment and Plan/Recommendations:  64 year old male with ulcerative colitis diagnosed 20 years ago here for follow-up visit  He is currently asymptomatic on maintenance dose of Pentasa  He self discontinued Imuran 2 months ago  Due for surveillance colonoscopy in 2018  Return in 1 year or sooner if needed

## 2015-01-02 LAB — PSA, TOTAL AND FREE
PSA FREE: 0.62 ng/mL
PSA, Free Pct: 21 % — ABNORMAL LOW (ref >25)
PSA: 2.98 ng/mL (ref <=4.00)

## 2015-01-02 LAB — HEPATITIS C ANTIBODY: HCV AB: NEGATIVE

## 2015-01-05 ENCOUNTER — Encounter: Payer: Self-pay | Admitting: Internal Medicine

## 2015-01-05 LAB — VITAMIN D 1,25 DIHYDROXY
VITAMIN D 1, 25 (OH) TOTAL: 49 pg/mL (ref 18–72)
Vitamin D2 1, 25 (OH)2: <8 pg/mL
Vitamin D3 1, 25 (OH)2: 49 pg/mL

## 2015-01-23 ENCOUNTER — Encounter: Payer: Self-pay | Admitting: Gastroenterology

## 2015-02-14 ENCOUNTER — Other Ambulatory Visit: Payer: Self-pay | Admitting: Cardiology

## 2015-02-26 ENCOUNTER — Ambulatory Visit: Payer: BLUE CROSS/BLUE SHIELD | Admitting: Cardiovascular Disease

## 2015-03-16 ENCOUNTER — Encounter: Payer: Self-pay | Admitting: *Deleted

## 2015-03-17 NOTE — Progress Notes (Signed)
Patient ID: Joseph Booker, male   DOB: 06/04/1950, 65 y.o.   MRN: [id number]    Cardiology Office Note   Date:  03/18/2015   ID:  Joseph Booker, DOB 07/03/1950, MRN [id number]  PCP:  Binnie Rail, MD  Cardiologist:  Jenkins Rouge, MD   Chief Complaint  Patient presents with  . Coronary Artery Disease    fatigue      History of Present Illness: Joseph Booker is a 65 y.o. male who presents today to follow-up coronary disease and shortness of breath. He is stable. New to me previously followed by Dr Ron Parker  He continues to have a sensation of shortness of breath when he takes a deep breath. He is able to do his exercise. The etiology of his shortness of breath has never been clear. The patient has had an extensive workup over time. It has been my opinion that his shortness of breath is not related to his cardiopulmonary status. He has undergone a cardiopulmonary exercise test showing no significant abnormality.  CAD:  Stent to LAD 2004  Last non ischemic myovue 2013   Echo 05/14/13 EF 50-55% mild MR   Walks 6 miles 6x/week  No angina.  Retired on disability as a Insurance underwriter.  Married with one sone who has 5 children   Past Medical History  Diagnosis Date  . Osteopenia   . Esophagitis   . CAD (coronary artery disease)     [name] / nuclear 2005, excellent tolerance no scar or ischemia, EF 51% / nuclear, February, 2012, no scar or ischemia / catheterization March 26, 2010.. 10% in-stent restenosis, minimal other nonobstructive coronary disease, ejection fraction 55%., excellent result; ETT 9/13: ex 13:14, no CP, no ischemic ECG changes  . INGUINAL HERNIA   . OSTEOPENIA   . VITAMIN D DEFICIENCY   . ALLERGIC RHINITIS   . ANEMIA, IRON DEFICIENCY   . Anxiety state, unspecified   . ULCERATIVE COLITIS-UNIVERSAL   . Hyperlipidemia   . GERD (gastroesophageal reflux disease)   . Arm pain     Right arm discomfort with some radiation from the biceps down to the wrist..  January, 2014  . Ejection  fraction   . Glaucoma     Past Surgical History  Procedure Laterality Date  . Angioplasty  2004    stent 2004  . Abdominal hernia repair  2006  . Tonsillectomy    . Rhinoseptoplasty      Patient Active Problem List   Diagnosis Date Noted  . Glaucoma 08/21/2013  . Mitral regurgitation 05/23/2013  . Ejection fraction   . Shortness of breath 05/13/2013  . Dilated aortic root ([facility]) 05/13/2013  . Pectus excavatum 05/13/2013  . Numbness and tingling in hands 05/13/2013  . Arm pain   . BPH (benign prostatic hypertrophy) 07/06/2011  . CAD (coronary artery disease)   . Hyperlipidemia 06/12/2009  . VITAMIN D DEFICIENCY 06/04/2009  . ALLERGIC RHINITIS 06/04/2009  . GERD 06/04/2009  . ANXIETY STATE, UNSPECIFIED 05/15/2009  . Osteopenia 05/15/2009  . Ulcerative colitis ([place]) 05/17/2007      Current Outpatient Prescriptions  Medication Sig Dispense Refill  . aspirin 81 MG tablet Take 81 mg by mouth daily.      . clopidogrel (PLAVIX) 75 MG tablet Take 75 mg by mouth daily.    . Coenzyme Q10 (CO Q-10) 300 MG CAPS Take 1 tablet by mouth daily.     Marland Kitchen latanoprost (XALATAN) 0.005 % ophthalmic solution Place 1 drop into both eyes at bedtime.    Marland Kitchen  mesalamine (PENTASA) 500 MG CR capsule Take 1,000 mg by mouth 4 (four) times daily.    . Multiple Vitamin (MULTIVITAMIN) tablet Take 1 tablet by mouth daily.    . simvastatin (ZOCOR) 10 MG tablet Take 10 mg by mouth daily.     No current facility-administered medications for this visit.    Allergies:   Dust mite extract    Social History:  The patient  reports that he has never smoked. He has never used smokeless tobacco. He reports that he drinks about 0.6 oz of alcohol per week. He reports that he does not use illicit drugs.   Family History:  The patient's family history includes Asthma in his sister; Diabetes in his mother; Heart disease in his mother; Heart disease (age of onset: 75) in his father; Lung disease in his mother; Rheum  arthritis in his mother. There is no history of Colon cancer, Esophageal cancer, Pancreatic cancer, Kidney disease, or Liver disease.    ROS:  Please see the history of present illness.   Patient denies fever, chills, headache, sweats, rash, change in vision, change in hearing, chest pain, cough, nausea or vomiting, urinary symptoms. All other systems are reviewed and are negative other than the history of present illness.     PHYSICAL EXAM: VS:  BP 122/80 mmHg  Pulse 63  Ht 5' 8.5" (1.74 m)  Wt 69.582 kg (153 lb 6.4 oz)  BMI 22.98 kg/m2  SpO2 97% , The patient is stable. He is oriented to person time and place. Affect is normal. Head is atraumatic. Sclera and conjunctiva are normal. There is no jugulovenous distention. Lungs are clear. Respiratory effort is not labored. Cardiac exam reveals an S1 and S2. The rhythm is regular. Abdomen is soft. There is no peripheral edema. There are no musculoskeletal deformities. There are no skin rashes. Neurologic is grossly intact.  EKG:    08/22/14  SR ate 53  LAD normal ST segments   03/18/15  NSR rate 61  LAD otherwise normal    Recent Labs: 01/01/2015: ALT 29; BUN 22; Creatinine, Ser 1.17; Hemoglobin 16.4; Platelets 190.0; Potassium 4.7; Sodium 141; TSH 1.24    Lipid Panel    Component Value Date/Time   CHOL 166 01/01/2015 1013   TRIG 55.0 01/01/2015 1013   TRIG 46 12/19/2008   HDL 54.90 01/01/2015 1013   CHOLHDL 3 01/01/2015 1013   VLDL 11.0 01/01/2015 1013   LDLCALC 100* 01/01/2015 1013      Wt Readings from Last 3 Encounters:  03/18/15 69.582 kg (153 lb 6.4 oz)  01/01/15 69.945 kg (154 lb 3.2 oz)  12/31/14 69.854 kg (154 lb)      Current medicines are reviewed  The patient understands his medications.     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  Continue medical Rx stent to  LAD 2004 nonischemic myovue 2013 Chol:   Cholesterol is at goal.  Continue current dose of statin and diet Rx.  No myalgias or side effects.  F/U  LFT's in 6 months. Lab Results  Component Value Date   LDLCALC 100* 01/01/2015  Neuropathy:  Hands and feet patient thinks breathing issues related to this as well  F/u endo/neuro R/o Myesthenia          Jenkins Rouge

## 2015-03-18 ENCOUNTER — Encounter: Payer: Self-pay | Admitting: Cardiovascular Disease

## 2015-03-18 ENCOUNTER — Ambulatory Visit (INDEPENDENT_AMBULATORY_CARE_PROVIDER_SITE_OTHER): Payer: BLUE CROSS/BLUE SHIELD | Admitting: Cardiovascular Disease

## 2015-03-18 VITALS — BP 122/80 | HR 63 | Ht 68.5 in | Wt 153.4 lb

## 2015-03-18 DIAGNOSIS — I2510 Atherosclerotic heart disease of native coronary artery without angina pectoris: Secondary | ICD-10-CM

## 2015-03-18 NOTE — Patient Instructions (Addendum)

## 2015-07-13 ENCOUNTER — Telehealth: Payer: Self-pay | Admitting: Gastroenterology

## 2015-07-13 NOTE — Telephone Encounter (Signed)
There are not any APP appointments. I saved an appointment on 08/05/15 with [name]. Please call the patient.

## 2015-08-05 ENCOUNTER — Encounter: Payer: Self-pay | Admitting: Gastroenterology

## 2015-08-05 ENCOUNTER — Ambulatory Visit (INDEPENDENT_AMBULATORY_CARE_PROVIDER_SITE_OTHER): Payer: BLUE CROSS/BLUE SHIELD | Admitting: Gastroenterology

## 2015-08-05 ENCOUNTER — Telehealth: Payer: Self-pay | Admitting: *Deleted

## 2015-08-05 VITALS — BP 110/70 | HR 72 | Ht 69.0 in | Wt 161.2 lb

## 2015-08-05 DIAGNOSIS — R1310 Dysphagia, unspecified: Secondary | ICD-10-CM

## 2015-08-05 DIAGNOSIS — K219 Gastro-esophageal reflux disease without esophagitis: Secondary | ICD-10-CM

## 2015-08-05 DIAGNOSIS — K6389 Other specified diseases of intestine: Secondary | ICD-10-CM | POA: Diagnosis not present

## 2015-08-05 DIAGNOSIS — K529 Noninfective gastroenteritis and colitis, unspecified: Secondary | ICD-10-CM

## 2015-08-05 MED ORDER — ESOMEPRAZOLE MAGNESIUM 40 MG PO CPDR: 40.0000 mg | DELAYED_RELEASE_CAPSULE | Freq: Every day | ORAL | Status: DC

## 2015-08-05 NOTE — Telephone Encounter (Signed)
  08/05/2015   RE: Joseph Booker DOB: 1950/03/28 MRN: [id number]   Dear Dr  Johnsie Cancel   We have scheduled the above patient for an endoscopic procedure. Our records show that he is on anticoagulation therapy.   Please advise as to how long the patient may come off his therapy of Plavix prior to the procedure, which is scheduled for 08/13/2015.  Please fax back/ or route the completed form to [facility] at [phone].   Sincerely,    Genella Mech

## 2015-08-05 NOTE — Patient Instructions (Signed)
You have been scheduled for an endoscopy. Please follow written instructions given to you at your visit today. If you use inhalers (even only as needed), please bring them with you on the day of your procedure. Your physician has requested that you go to www.startemmi.com and enter the access code given to you at your visit today. This web site gives a general overview about your procedure. However, you should still follow specific instructions given to you by our office regarding your preparation for the procedure.  You will be contacted by our office prior to your procedure for directions on holding your Plavix.  If you do not hear from our office 1 week prior to your scheduled procedure, please call [phone] to discuss.   We have sent Nexium to your pharmacy

## 2015-08-07 NOTE — Telephone Encounter (Signed)
Ok to hold plavix 5 days before procedure

## 2015-08-07 NOTE — Telephone Encounter (Signed)
Ok to hold pt aware

## 2015-08-09 ENCOUNTER — Other Ambulatory Visit: Payer: Self-pay | Admitting: Cardiology

## 2015-08-13 ENCOUNTER — Ambulatory Visit (AMBULATORY_SURGERY_CENTER): Payer: BLUE CROSS/BLUE SHIELD | Admitting: Gastroenterology

## 2015-08-13 ENCOUNTER — Encounter: Payer: Self-pay | Admitting: Gastroenterology

## 2015-08-13 VITALS — BP 106/64 | HR 48 | Temp 98.2°F | Resp 13 | Ht 69.0 in | Wt 161.0 lb

## 2015-08-13 DIAGNOSIS — K219 Gastro-esophageal reflux disease without esophagitis: Secondary | ICD-10-CM | POA: Diagnosis present

## 2015-08-13 DIAGNOSIS — K222 Esophageal obstruction: Secondary | ICD-10-CM | POA: Diagnosis not present

## 2015-08-13 MED ORDER — SODIUM CHLORIDE 0.9 % IV SOLN: INTRAVENOUS | Status: DC

## 2015-08-13 NOTE — Progress Notes (Signed)
Teeth unchanged after procedure.A and O x3. Report to RN. Tolerated MAC anesthesia well. 

## 2015-08-13 NOTE — Op Note (Signed)
[facility] Patient Name: Joseph Booker Procedure Date: 08/13/2015 8:29 AM MRN: [id number] Endoscopist: Mauri Pole , MD Age: 65 Referring MD:  Date of Birth: [birth year] Gender: Male Account #: [id number] Procedure:                Upper GI endoscopy Indications:              New-onset upper abdominal symptoms in patient older                            than 50 years Medicines:                Monitored Anesthesia Care Procedure:                Pre-Anesthesia Assessment:                           - Prior to the procedure, a History and Physical                            was performed, and patient medications and                            allergies were reviewed. The patient's tolerance of                            previous anesthesia was also reviewed. The risks                            and benefits of the procedure and the sedation                            options and risks were discussed with the patient.                            All questions were answered, and informed consent                            was obtained. Prior Anticoagulants: The patient                            last took Plavix (clopidogrel) 5 days prior to the                            procedure and last took aspirin on the day of the                            procedure. ASA Grade Assessment: II - A patient                            with mild systemic disease. After reviewing the                            risks and benefits, the patient was deemed in  satisfactory condition to undergo the procedure.                           After obtaining informed consent, the endoscope was                            passed under direct vision. Throughout the                            procedure, the patient's blood pressure, pulse, and                            oxygen saturations were monitored continuously. The                            Model GIF-HQ190 [id number]) scope  was introduced                            through the mouth, and advanced to the second part                            of duodenum. The upper GI endoscopy was                            accomplished without difficulty. The patient                            tolerated the procedure well. Scope In: Scope Out: Findings:                 A 4 cm hiatal hernia was present.                           One mild benign-appearing, intrinsic stenosis was                            found 36 to 37 cm from the incisors. This measured                            1.8 cm (inner diameter) x less than one cm (in                            length) and was traversed. A TTS dilator was passed                            through the scope. Dilation with an 18-19-20 mm                            balloon dilator was performed to 20 mm. The                            dilation site was examined following endoscope  reinsertion and showed no change.                           The stomach was normal.                           The examined duodenum was normal. Complications:            No immediate complications. Estimated Blood Loss:     Estimated blood loss: none. Impression:               - 4 cm hiatal hernia.                           - Benign-appearing esophageal stenosis. Dilated.                           - Normal stomach.                           - Normal examined duodenum.                           - No specimens collected. Recommendation:           - Patient has a contact number available for                            emergencies. The signs and symptoms of potential                            delayed complications were discussed with the                            patient. Return to normal activities tomorrow.                            Written discharge instructions were provided to the                            patient.                           - Resume previous diet.                            - Continue present medications.                           - Resume Plavix (clopidogrel) at prior dose today.                           - No ibuprofen, naproxen, or other non-steroidal                            anti-inflammatory drugs.                           - Repeat upper endoscopy  PRN for retreatment. Mauri Pole, MD 08/13/2015 8:56:03 AM This report has been signed electronically.

## 2015-08-13 NOTE — Patient Instructions (Signed)
RESUME YOUR PLAVIX AT PRIOR DOSE TODAY.  NO IBUPROFEN , NAPROSYN OR ANY OTHER NSAIDS.     YOU HAD AN ENDOSCOPIC PROCEDURE TODAY AT [facility]:   Refer to the procedure report that was given to you for any specific questions about what was found during the examination.  If the procedure report does not answer your questions, please call your gastroenterologist to clarify.  If you requested that your care partner not be given the details of your procedure findings, then the procedure report has been included in a sealed envelope for you to review at your convenience later.  YOU SHOULD EXPECT: Some feelings of bloating in the abdomen. Passage of more gas than usual.  Walking can help get rid of the air that was put into your GI tract during the procedure and reduce the bloating. If you had a lower endoscopy (such as a colonoscopy or flexible sigmoidoscopy) you may notice spotting of blood in your stool or on the toilet paper. If you underwent a bowel prep for your procedure, you may not have a normal bowel movement for a few days.  Please Note:  You might notice some irritation and congestion in your nose or some drainage.  This is from the oxygen used during your procedure.  There is no need for concern and it should clear up in a day or so.  SYMPTOMS TO REPORT IMMEDIATELY:    Following upper endoscopy (EGD)  Vomiting of blood or coffee ground material  New chest pain or pain under the shoulder blades  Painful or persistently difficult swallowing  New shortness of breath  Fever of 100F or higher  Black, tarry-looking stools  For urgent or emergent issues, a gastroenterologist can be reached at any hour by calling [phone].   DIET: NOTHING TO EAT OR DRINK UNTIL 10:00. 10:00 UNTIL 11:00 ONLY CLEAR LIQUIDS. AFTER 11:00 ONLY SOFT FOODS UNTIL MORNING. RESUME YOUR REGULAR DIET IN THE AM.   ACTIVITY:  You should plan to take it easy for the rest of today and you  should NOT DRIVE or use heavy machinery until tomorrow (because of the sedation medicines used during the test).    FOLLOW UP: Our staff will call the number listed on your records the next business day following your procedure to check on you and address any questions or concerns that you may have regarding the information given to you following your procedure. If we do not reach you, we will leave a message.  However, if you are feeling well and you are not experiencing any problems, there is no need to return our call.  We will assume that you have returned to your regular daily activities without incident.  If any biopsies were taken you will be contacted by phone or by letter within the next 1-3 weeks.  Please call us at [phone] if you have not heard about the biopsies in 3 weeks.    SIGNATURES/CONFIDENTIALITY: You and/or your care partner have signed paperwork which will be entered into your electronic medical record.  These signatures attest to the fact that that the information above on your After Visit Summary has been reviewed and is understood.  Full responsibility of the confidentiality of this discharge information lies with you and/or your care-partner.

## 2015-08-13 NOTE — Progress Notes (Signed)
Called to room to assist during endoscopic procedure.  Patient ID and intended procedure confirmed with present staff. Received instructions for my participation in the procedure from the performing physician.  

## 2015-08-14 ENCOUNTER — Telehealth: Payer: Self-pay | Admitting: *Deleted

## 2015-08-14 NOTE — Telephone Encounter (Signed)
  Follow up Call-  Call back number 08/13/2015 07/31/2014  Post procedure Call Back phone  # [phone]  Permission to leave phone message Yes Yes  Some recent data might be hidden    Spoke with wife, pt left for work Patient questions:  Do you have a fever, pain , or abdominal swelling? No. Pain Score  0 *  Have you tolerated food without any problems? Yes.    Have you been able to return to your normal activities? Yes.    Do you have any questions about your discharge instructions: Diet   No. Medications  No. Follow up visit  No.  Do you have questions or concerns about your Care? No.  Actions: * If pain score is 4 or above: No action needed, pain <4.

## 2015-08-17 NOTE — Progress Notes (Signed)
Joseph Booker    [id number]    [birth year]  Primary Care Physician:Joseph Lorretta Harp, MD  Referring Physician: Binnie Rail, MD [name], [place] [address]  Chief complaint:  GERD HPI: 65 year old male with history of ulcerative colitis diagnosed about 20 years ago here for follow-up visit with new onset upper GI symptoms. UC is in remission for past 10 years on maintenance dose of Pentasa and Imuran.  He recently stopped Imuran in 2016 because he thought it may be playing a role with shortness of breath and respiratory symptoms.  He has had extensive evaluation by pulmonologist and cardiologist and so far workup is unrevealing for any significant lung or cardiac pathology.  He has been having intermittent heartburn and regurgitation. He also has chronic cough.  Denies any nausea, vomiting, abdominal pain, melena or bright red blood per rectum. Also c/o intermittent solid dysphagia. Last colonoscopy in July 2016 was normal and random biopsies showed inactive colitis. No h/o EGD.   Outpatient Encounter Prescriptions as of 08/05/2015  Medication Sig  . aspirin 81 MG tablet Take 81 mg by mouth daily.    . clopidogrel (PLAVIX) 75 MG tablet Take 75 mg by mouth daily.  . Coenzyme Q10 (CO Q-10) 300 MG CAPS Take 1 tablet by mouth daily.   Marland Kitchen latanoprost (XALATAN) 0.005 % ophthalmic solution Place 1 drop into both eyes at bedtime.  Marland Kitchen loratadine (CLARITIN) 10 MG tablet Take 10 mg by mouth daily.  . mesalamine (PENTASA) 500 MG CR capsule Take 1,000 mg by mouth 4 (four) times daily.  . Multiple Vitamin (MULTIVITAMIN) tablet Take 1 tablet by mouth daily.  . timolol (TIMOPTIC) 0.5 % ophthalmic solution INSTILL 1 DROP INTO LEFT EYE EVERY MORNING  . [DISCONTINUED] simvastatin (ZOCOR) 10 MG tablet Take 10 mg by mouth daily.  Marland Kitchen esomeprazole (NEXIUM) 40 MG capsule Take 1 capsule (40 mg total) by mouth daily at 12 noon.   No facility-administered encounter medications on file as of 08/05/2015.      Allergies as of 08/05/2015 - Review Complete 08/05/2015  Allergen Reaction Noted  . Dust mite extract  03/09/2014    Past Medical History:  Diagnosis Date  . ALLERGIC RHINITIS   . ANEMIA, IRON DEFICIENCY   . Anxiety state, unspecified   . Arm pain    Right arm discomfort with some radiation from the biceps down to the wrist..  January, 2014  . CAD (coronary artery disease)    [name] / nuclear 2005, excellent tolerance no scar or ischemia, EF 51% / nuclear, February, 2012, no scar or ischemia / catheterization March 26, 2010.. 10% in-stent restenosis, minimal other nonobstructive coronary disease, ejection fraction 55%., excellent result; ETT 9/13: ex 13:14, no CP, no ischemic ECG changes  . Cataract   . Ejection fraction   . Esophagitis   . GERD (gastroesophageal reflux disease)   . Glaucoma   . Hyperlipidemia   . INGUINAL HERNIA   . Osteopenia   . OSTEOPENIA   . ULCERATIVE COLITIS-UNIVERSAL   . VITAMIN D DEFICIENCY     Past Surgical History:  Procedure Laterality Date  . ABDOMINAL HERNIA REPAIR  2006  . ANGIOPLASTY  2004   stent 2004  . COLONOSCOPY    . rhinoseptoplasty    . TONSILLECTOMY      Family History  Problem Relation Age of Onset  . Diabetes Mother   . Heart disease Mother   . Lung disease  Mother     ILD  . Rheum arthritis Mother   . Heart disease Father 81    MI  . Asthma Sister   . Colon cancer Neg Hx   . Esophageal cancer Neg Hx   . Pancreatic cancer Neg Hx   . Kidney disease Neg Hx   . Liver disease Neg Hx     Social History   Social History  . Marital status: Married    Spouse name: N/A  . Number of children: 1  . Years of education: N/A   Occupational History  . retired Retired   Social History Main Topics  . Smoking status: Never Smoker  . Smokeless tobacco: Never Used     Comment: Married, 1 stepson, 5 g-kids. Retired Emergency planning/management officer, now Herbalist  . Alcohol use 0.6 oz/week    1 Glasses of wine per week       Comment: weekly at most  . Drug use: No  . Sexual activity: Yes    Partners: Female   Other Topics Concern  . Not on file   Social History Narrative   Exercising regularly: walks at least 5 miles a day, 6 days a week      Review of systems: Review of Systems  Constitutional: Negative for fever and chills.  HENT: Negative.   Eyes: Negative for blurred vision.  Respiratory: Positive for cough, shortness of breath and wheezing.   Cardiovascular: Negative for chest pain and palpitations.  Gastrointestinal: as per HPI Genitourinary: Negative for dysuria, urgency, frequency and hematuria.  Musculoskeletal: Negative for myalgias, back pain and joint pain.  Skin: Negative for itching and rash.  Neurological: Negative for dizziness, tremors, focal weakness, seizures and loss of consciousness.  Psychiatric/Behavioral: Negative for depression, suicidal ideas and hallucinations.  All other systems reviewed and are negative.   Physical Exam: Vitals:   08/05/15 0856  BP: 110/70  Pulse: 72   Gen:      No acute distress HEENT:  EOMI, sclera anicteric Neck:     No masses; no thyromegaly Lungs:    Clear to auscultation bilaterally; normal respiratory effort CV:         Regular rate and rhythm; no murmurs Abd:      + bowel sounds; soft, non-tender; no palpable masses, no distension Ext:    No edema; adequate peripheral perfusion Skin:      Warm and dry; no rash Neuro: alert and oriented x 3 Psych: normal mood and affect  Data Reviewed: Reviewed chart in epic   Assessment and Plan/Recommendations: 65 year old male with long-standing history of ulcerative colitis in remission on Pentasa here with complaints of new in onset upper GI symptoms and intermittent dysphagia We'll schedule for EGD for evaluation and possible esophageal dilation Nexium daily 30 minutes before breakfast for 2 months and follow antireflux measures Discuss with cardiology regarding holding Plavix for 5  days prior to the procedure  Ulcerative colitis: Inactive based on random biopsies from July 2016, is currently asymptomatic Continue Pentasa  Return as needed  25 minutes was spent face-to-face with the patient. Greater than 50% of the time used for counseling  as well as treatment plan and follow-up. He had multiple questions which were answered to his satisfaction    K. Denzil Magnuson , MD [phone] Mon-Fri 8a-5p [phone] after 5p, weekends, holidays  CC: Burns, Claudina Lick, MD

## 2015-09-03 ENCOUNTER — Ambulatory Visit: Payer: BLUE CROSS/BLUE SHIELD | Admitting: Gastroenterology

## 2015-09-25 ENCOUNTER — Other Ambulatory Visit: Payer: Self-pay | Admitting: Cardiology

## 2015-09-25 ENCOUNTER — Telehealth: Payer: Self-pay | Admitting: Gastroenterology

## 2015-09-25 NOTE — Telephone Encounter (Signed)
Rec'd [employer] disability forms from [employer] in error. Sent forms interoffice via dumbwaiter to the 3rd fl for completion.-09/25/15-pr

## 2016-02-23 ENCOUNTER — Other Ambulatory Visit: Payer: Self-pay | Admitting: Gastroenterology

## 2016-03-23 NOTE — Progress Notes (Signed)
Subjective:    Patient ID: Joseph Booker, male    DOB: 07/18/1950, 66 y.o.   MRN: [id number]  HPI He is here for a physical exam.   He denies any changes in his history since he was here last.  He has no concerns. Overall, he feels well.    Medications and allergies reviewed with patient and updated if appropriate.  Patient Active Problem List   Diagnosis Date Noted  . Glaucoma 08/21/2013  . Mitral regurgitation 05/23/2013  . Ejection fraction   . Shortness of breath 05/13/2013  . Dilated aortic root ([place]) 05/13/2013  . Pectus excavatum 05/13/2013  . Numbness and tingling in hands 05/13/2013  . BPH (benign prostatic hypertrophy) 07/06/2011  . CAD (coronary artery disease)   . Hyperlipidemia 06/12/2009  . VITAMIN D DEFICIENCY 06/04/2009  . ALLERGIC RHINITIS 06/04/2009  . GERD 06/04/2009  . ANXIETY STATE, UNSPECIFIED 05/15/2009  . Osteopenia 05/15/2009  . Ulcerative colitis ([place]) 05/17/2007    Current Outpatient Prescriptions on File Prior to Visit  Medication Sig Dispense Refill  . aspirin 81 MG tablet Take 81 mg by mouth daily.      . clopidogrel (PLAVIX) 75 MG tablet TAKE 1 TABLET BY MOUTH EVERY DAY 90 tablet 2  . Coenzyme Q10 (CO Q-10) 300 MG CAPS Take 1 tablet by mouth daily.     Marland Kitchen doxycycline (VIBRAMYCIN) 100 MG capsule Take 100 mg by mouth daily.  3  . esomeprazole (NEXIUM) 40 MG capsule Take 1 capsule (40 mg total) by mouth daily at 12 noon. 30 capsule 6  . latanoprost (XALATAN) 0.005 % ophthalmic solution Place 1 drop into both eyes at bedtime.    Marland Kitchen loratadine (CLARITIN) 10 MG tablet Take 10 mg by mouth daily.    . Multiple Vitamin (MULTIVITAMIN) tablet Take 1 tablet by mouth daily.    Marland Kitchen PENTASA 500 MG CR capsule TAKE 2 CAPSULES BY MOUTH 4 TIMES A DAY 720 capsule 1  . simvastatin (ZOCOR) 10 MG tablet TAKE 1 TABLET EVERY DAY 90 tablet 3  . timolol (TIMOPTIC) 0.5 % ophthalmic solution INSTILL 1 DROP INTO LEFT EYE EVERY MORNING  3   No current facility-administered  medications on file prior to visit.     Past Medical History:  Diagnosis Date  . ALLERGIC RHINITIS   . ANEMIA, IRON DEFICIENCY   . Anxiety state, unspecified   . Arm pain    Right arm discomfort with some radiation from the biceps down to the wrist..  January, 2014  . CAD (coronary artery disease)    [name] / nuclear 2005, excellent tolerance no scar or ischemia, EF 51% / nuclear, February, 2012, no scar or ischemia / catheterization March 26, 2010.. 10% in-stent restenosis, minimal other nonobstructive coronary disease, ejection fraction 55%., excellent result; ETT 9/13: ex 13:14, no CP, no ischemic ECG changes  . Cataract   . Ejection fraction   . Esophagitis   . GERD (gastroesophageal reflux disease)   . Glaucoma   . Hyperlipidemia   . INGUINAL HERNIA   . Osteopenia   . OSTEOPENIA   . ULCERATIVE COLITIS-UNIVERSAL   . VITAMIN D DEFICIENCY     Past Surgical History:  Procedure Laterality Date  . ABDOMINAL HERNIA REPAIR  2006  . ANGIOPLASTY  2004   stent 2004  . COLONOSCOPY    . rhinoseptoplasty    . TONSILLECTOMY      Social History   Social History  . Marital status: Married    Spouse  name: N/A  . Number of children: 1  . Years of education: N/A   Occupational History  . retired Retired   Social History Main Topics  . Smoking status: Never Smoker  . Smokeless tobacco: Never Used     Comment: Married, 1 stepson, 5 g-kids. Retired Emergency planning/management officer, now Herbalist  . Alcohol use 0.6 oz/week    1 Glasses of wine per week     Comment: weekly at most  . Drug use: No  . Sexual activity: Yes    Partners: Female   Other Topics Concern  . Not on file   Social History Narrative   Exercising regularly: walks at least 5 miles a day, 6 days a week    Family History  Problem Relation Age of Onset  . Diabetes Mother   . Heart disease Mother   . Lung disease Mother     ILD  . Rheum arthritis Mother   . Heart disease Father 53    MI  . Asthma  Sister   . Colon cancer Neg Hx   . Esophageal cancer Neg Hx   . Pancreatic cancer Neg Hx   . Kidney disease Neg Hx   . Liver disease Neg Hx     Review of Systems  Constitutional: Positive for fatigue. Negative for appetite change, chills, fever and unexpected weight change.  HENT: Positive for sneezing.   Eyes: Positive for visual disturbance (blurry vision at times).  Respiratory: Negative for cough, shortness of breath and wheezing.        Feeling of not being able to get deep breath -with and without exercise  Cardiovascular: Positive for chest pain (occasional). Negative for palpitations and leg swelling.  Gastrointestinal: Negative for abdominal pain, blood in stool, constipation, diarrhea and nausea.       No gerd  Endocrine: Positive for polydipsia, polyphagia and polyuria.  Genitourinary: Negative for difficulty urinating, dysuria and hematuria.  Musculoskeletal: Negative for arthralgias, back pain and myalgias.  Skin: Positive for color change (mole change on neck/face). Negative for rash.  Neurological: Negative for dizziness, light-headedness and headaches.  Psychiatric/Behavioral: Negative for dysphoric mood. The patient is not nervous/anxious.        Objective:   Vitals:   03/24/16 1055  BP: 128/88  Pulse: (!) 52  Resp: 16  Temp: 97.5 F (36.4 C)   Filed Weights   03/24/16 1055  Weight: 161 lb (73 kg)   Body mass index is 23.78 kg/m.  Wt Readings from Last 3 Encounters:  03/24/16 161 lb (73 kg)  08/13/15 161 lb (73 kg)  08/05/15 161 lb 3.2 oz (73.1 kg)     Physical Exam Constitutional: He appears well-developed and well-nourished. No distress.  HENT:  Head: Normocephalic and atraumatic.  Right Ear: External ear normal.  Left Ear: External ear normal.  Mouth/Throat: Oropharynx is clear and moist.  Normal ear canals and TM b/l  Eyes: Conjunctivae and EOM are normal.  Neck: Neck supple. No tracheal deviation present. No thyromegaly present.  No  carotid bruit  Cardiovascular: Normal rate, regular rhythm, normal heart sounds and intact distal pulses.   No murmur heard. Pulmonary/Chest: Effort normal and breath sounds normal. No respiratory distress. He has no wheezes. He has no rales.  Abdominal: Soft. Bowel sounds are normal. He exhibits no distension. There is no tenderness.  Genitourinary: deferred by patient - will check psa Musculoskeletal: He exhibits no edema.  Lymphadenopathy:   He has no cervical adenopathy.  Skin: Skin is  warm and dry. He is not diaphoretic.  Psychiatric: He has a normal mood and affect. His behavior is normal.         Assessment & Plan:    Physical exam: Screening blood work  ordered Immunizations  Up to date  Colonoscopy  Up to date  Eye exams  Up to date  Exercise - regular Weight - normal BMI Skin  - no concerns, sees derm annually Substance abuse  - none  See Problem List for Assessment and Plan of chronic medical problems.   FU annually

## 2016-03-24 ENCOUNTER — Ambulatory Visit (INDEPENDENT_AMBULATORY_CARE_PROVIDER_SITE_OTHER): Payer: BLUE CROSS/BLUE SHIELD | Admitting: Internal Medicine

## 2016-03-24 ENCOUNTER — Encounter: Payer: Self-pay | Admitting: Internal Medicine

## 2016-03-24 VITALS — BP 128/88 | HR 52 | Temp 97.5°F | Resp 16 | Ht 69.0 in | Wt 161.0 lb

## 2016-03-24 DIAGNOSIS — I2510 Atherosclerotic heart disease of native coronary artery without angina pectoris: Secondary | ICD-10-CM

## 2016-03-24 DIAGNOSIS — E7800 Pure hypercholesterolemia, unspecified: Secondary | ICD-10-CM

## 2016-03-24 DIAGNOSIS — Z0000 Encounter for general adult medical examination without abnormal findings: Secondary | ICD-10-CM

## 2016-03-24 DIAGNOSIS — K219 Gastro-esophageal reflux disease without esophagitis: Secondary | ICD-10-CM

## 2016-03-24 DIAGNOSIS — K5190 Ulcerative colitis, unspecified, without complications: Secondary | ICD-10-CM

## 2016-03-24 NOTE — Assessment & Plan Note (Signed)
Check lipid panel.  Continue statin. 

## 2016-03-24 NOTE — Assessment & Plan Note (Signed)
Well-controlled  following with GI

## 2016-03-24 NOTE — Progress Notes (Signed)
Pre visit review using our clinic review tool, if applicable. No additional management support is needed unless otherwise documented below in the visit note. 

## 2016-03-24 NOTE — Patient Instructions (Addendum)
Test(s) ordered today. Your results will be released to [facility] (or called to you) after review, usually within 72hours after test completion. If any changes need to be made, you will be notified at that same time.  All other Health Maintenance issues reviewed.   All recommended immunizations and age-appropriate screenings are up-to-date or discussed.  No immunizations administered today.   Medications reviewed and updated.  No changes recommended at this time.   Please followup in one year for a physical    Health Maintenance, Male A healthy lifestyle and preventive care is important for your health and wellness. Ask your health care provider about what schedule of regular examinations is right for you. What should I know about weight and diet?  Eat a Healthy Diet  Eat plenty of vegetables, fruits, whole grains, low-fat dairy products, and lean protein.  Do not eat a lot of foods high in solid fats, added sugars, or salt. Maintain a Healthy Weight  Regular exercise can help you achieve or maintain a healthy weight. You should:  Do at least 150 minutes of exercise each week. The exercise should increase your heart rate and make you sweat (moderate-intensity exercise).  Do strength-training exercises at least twice a week. Watch Your Levels of Cholesterol and Blood Lipids  Have your blood tested for lipids and cholesterol every 5 years starting at 64 years of age. If you are at high risk for heart disease, you should start having your blood tested when you are 64 years old. You may need to have your cholesterol levels checked more often if:  Your lipid or cholesterol levels are high.  You are older than 64 years of age.  You are at high risk for heart disease. What should I know about cancer screening? Many types of cancers can be detected early and may often be prevented. Lung Cancer  You should be screened every year for lung cancer if:  You are a current smoker who has  smoked for at least 30 years.  You are a former smoker who has quit within the past 15 years.  Talk to your health care provider about your screening options, when you should start screening, and how often you should be screened. Colorectal Cancer  Routine colorectal cancer screening usually begins at 64 years of age and should be repeated every 5-10 years until you are 64 years old. You may need to be screened more often if early forms of precancerous polyps or small growths are found. Your health care provider may recommend screening at an earlier age if you have risk factors for colon cancer.  Your health care provider may recommend using home test kits to check for hidden blood in the stool.  A small camera at the end of a tube can be used to examine your colon (sigmoidoscopy or colonoscopy). This checks for the earliest forms of colorectal cancer. Prostate and Testicular Cancer  Depending on your age and overall health, your health care provider may do certain tests to screen for prostate and testicular cancer.  Talk to your health care provider about any symptoms or concerns you have about testicular or prostate cancer. Skin Cancer  Check your skin from head to toe regularly.  Tell your health care provider about any new moles or changes in moles, especially if:  There is a change in a mole's size, shape, or color.  You have a mole that is larger than a pencil eraser.  Always use sunscreen. Apply sunscreen liberally and  repeat throughout the day.  Protect yourself by wearing long sleeves, pants, a wide-brimmed hat, and sunglasses when outside. What should I know about heart disease, diabetes, and high blood pressure?  If you are 46-68 years of age, have your blood pressure checked every 3-5 years. If you are 36 years of age or older, have your blood pressure checked every year. You should have your blood pressure measured twice-once when you are at a hospital or clinic, and once  when you are not at a hospital or clinic. Record the average of the two measurements. To check your blood pressure when you are not at a hospital or clinic, you can use:  An automated blood pressure machine at a pharmacy.  A home blood pressure monitor.  Talk to your health care provider about your target blood pressure.  If you are between 45-50 years old, ask your health care provider if you should take aspirin to prevent heart disease.  Have regular diabetes screenings by checking your fasting blood sugar level.  If you are at a normal weight and have a low risk for diabetes, have this test once every three years after the age of 31.  If you are overweight and have a high risk for diabetes, consider being tested at a younger age or more often.  A one-time screening for abdominal aortic aneurysm (AAA) by ultrasound is recommended for men aged 36-75 years who are current or former smokers. What should I know about preventing infection? Hepatitis B  If you have a higher risk for hepatitis B, you should be screened for this virus. Talk with your health care provider to find out if you are at risk for hepatitis B infection. Hepatitis C  Blood testing is recommended for:  Everyone born from [birth year] through 1963.  Anyone with known risk factors for hepatitis C. Sexually Transmitted Diseases (STDs)  You should be screened each year for STDs including gonorrhea and chlamydia if:  You are sexually active and are younger than 64 years of age.  You are older than 64 years of age and your health care provider tells you that you are at risk for this type of infection.  Your sexual activity has changed since you were last screened and you are at an increased risk for chlamydia or gonorrhea. Ask your health care provider if you are at risk.  Talk with your health care provider about whether you are at high risk of being infected with HIV. Your health care provider may recommend a prescription  medicine to help prevent HIV infection. What else can I do?  Schedule regular health, dental, and eye exams.  Stay current with your vaccines (immunizations).  Do not use any tobacco products, such as cigarettes, chewing tobacco, and e-cigarettes. If you need help quitting, ask your health care provider.  Limit alcohol intake to no more than 2 drinks per day. One drink equals 12 ounces of beer, 5 ounces of wine, or 1 ounces of hard liquor.  Do not use street drugs.  Do not share needles.  Ask your health care provider for help if you need support or information about quitting drugs.  Tell your health care provider if you often feel depressed.  Tell your health care provider if you have ever been abused or do not feel safe at home. This information is not intended to replace advice given to you by your health care provider. Make sure you discuss any questions you have with your health care provider.  Document Released: 07/02/2007 Document Revised: 09/02/2015 Document Reviewed: 10/07/2014 Elsevier Interactive Patient Education  2017 [employer].

## 2016-03-24 NOTE — Assessment & Plan Note (Signed)
No chest pain or concerning symptoms following with cardiology Continue current medications

## 2016-03-24 NOTE — Assessment & Plan Note (Signed)
GERD controlled Continue daily medication  

## 2016-03-25 ENCOUNTER — Other Ambulatory Visit: Payer: Self-pay | Admitting: Internal Medicine

## 2016-03-25 ENCOUNTER — Other Ambulatory Visit (INDEPENDENT_AMBULATORY_CARE_PROVIDER_SITE_OTHER): Payer: BLUE CROSS/BLUE SHIELD

## 2016-03-25 DIAGNOSIS — Z0000 Encounter for general adult medical examination without abnormal findings: Secondary | ICD-10-CM

## 2016-03-25 DIAGNOSIS — E7800 Pure hypercholesterolemia, unspecified: Secondary | ICD-10-CM | POA: Diagnosis not present

## 2016-03-25 LAB — COMPREHENSIVE METABOLIC PANEL
ALK PHOS: 51 U/L (ref 39–117)
ALT: 25 U/L (ref 0–53)
AST: 27 U/L (ref 0–37)
Albumin: 4.2 g/dL (ref 3.5–5.2)
BILIRUBIN TOTAL: 0.6 mg/dL (ref 0.2–1.2)
BUN: 22 mg/dL (ref 6–23)
CALCIUM: 9.6 mg/dL (ref 8.4–10.5)
CO2: 29 mEq/L (ref 19–32)
CREATININE: 1.07 mg/dL (ref 0.40–1.50)
Chloride: 107 mEq/L (ref 96–112)
GFR: 73.96 mL/min (ref >60.00)
GLUCOSE: 97 mg/dL (ref 70–99)
Potassium: 3.8 mEq/L (ref 3.5–5.1)
Sodium: 141 mEq/L (ref 135–145)
TOTAL PROTEIN: 7.1 g/dL (ref 6.0–8.3)

## 2016-03-25 LAB — CBC WITH DIFFERENTIAL/PLATELET
BASOS ABS: 0.1 10*3/uL (ref 0.0–0.1)
Basophils Relative: 2.2 % (ref 0.0–3.0)
EOS ABS: 0.1 10*3/uL (ref 0.0–0.7)
Eosinophils Relative: 3 % (ref 0.0–5.0)
HCT: 46 % (ref 39.0–52.0)
Hemoglobin: 15.7 g/dL (ref 13.0–17.0)
Lymphocytes Relative: 29.2 % (ref 12.0–46.0)
Lymphs Abs: 1.4 10*3/uL (ref 0.7–4.0)
MCHC: 34 g/dL (ref 30.0–36.0)
MCV: 94.6 fl (ref 78.0–100.0)
MONOS PCT: 10.4 % (ref 3.0–12.0)
Monocytes Absolute: 0.5 10*3/uL (ref 0.1–1.0)
Neutro Abs: 2.7 10*3/uL (ref 1.4–7.7)
Neutrophils Relative %: 55.2 % (ref 43.0–77.0)
Platelets: 183 10*3/uL (ref 150.0–400.0)
RBC: 4.86 Mil/uL (ref 4.22–5.81)
RDW: 13.2 % (ref 11.5–15.5)
WBC: 4.9 10*3/uL (ref 4.0–10.5)

## 2016-03-25 LAB — LIPID PANEL
CHOLESTEROL: 166 mg/dL (ref 0–200)
HDL: 45 mg/dL (ref >39.00)
LDL Cholesterol: 103 mg/dL — ABNORMAL HIGH (ref 0–99)
NonHDL: 121.2
TRIGLYCERIDES: 91 mg/dL (ref 0.0–149.0)
Total CHOL/HDL Ratio: 4
VLDL: 18.2 mg/dL (ref 0.0–40.0)

## 2016-03-25 LAB — HEMOGLOBIN A1C: Hgb A1c MFr Bld: 5.6 % (ref 4.6–6.5)

## 2016-03-25 LAB — TSH: TSH: 0.99 u[IU]/mL (ref 0.35–4.50)

## 2016-03-28 ENCOUNTER — Encounter: Payer: Self-pay | Admitting: Internal Medicine

## 2016-03-28 LAB — PSA, TOTAL AND FREE
PSA, % Free: 22 % — ABNORMAL LOW (ref >25)
PSA, Free: 0.6 ng/mL
PSA, Total: 2.7 ng/mL (ref <=4.0)

## 2016-04-22 ENCOUNTER — Encounter: Payer: Self-pay | Admitting: *Deleted

## 2016-05-09 NOTE — Progress Notes (Signed)
Patient ID: Joseph Booker, male   DOB: 1950-11-05, 66 y.o.   MRN: [id number]    Cardiology Office Note   Date:  05/11/2016   ID:  Joseph Booker, DOB 1950-07-07, MRN [id number]  PCP:  Binnie Rail, MD  Cardiologist:  Jenkins Rouge, MD   Chief Complaint  Patient presents with  . Coronary Artery Disease      History of Present Illness: Joseph Booker is a 66 y.o. male who presents today to follow-up coronary disease and shortness of breath. He is stable. New to me previously followed by Dr Ron Parker  He continues to have a sensation of shortness of breath when he takes a deep breath. He is able to do his exercise. The etiology of his shortness of breath has never been clear. The patient has had an extensive workup over time. It has been my opinion that his shortness of breath is not related to his cardiopulmonary status. He has undergone a cardiopulmonary exercise test showing no significant abnormality.  CAD:  Stent to LAD 2004  Last non ischemic myovue 2013   Echo 05/14/13 EF 50-55% mild MR   Walks 6 miles 6x/week  Does have some chest tightness and dyspnea Tightness resolves quickly and he does not need nitro   Retired on disability as a Insurance underwriter.  Married with one sone who has 5 children   Past Medical History:  Diagnosis Date  . ALLERGIC RHINITIS   . ANEMIA, IRON DEFICIENCY   . Anxiety state, unspecified   . Arm pain    Right arm discomfort with some radiation from the biceps down to the wrist..  January, 2014  . CAD (coronary artery disease)    [name] / nuclear 2005, excellent tolerance no scar or ischemia, EF 51% / nuclear, February, 2012, no scar or ischemia / catheterization March 26, 2010.. 10% in-stent restenosis, minimal other nonobstructive coronary disease, ejection fraction 55%., excellent result; ETT 9/13: ex 13:14, no CP, no ischemic ECG changes  . Cataract   . Ejection fraction   . Esophagitis   . GERD (gastroesophageal reflux disease)   . Glaucoma   . Hyperlipidemia    . INGUINAL HERNIA   . Osteopenia   . OSTEOPENIA   . ULCERATIVE COLITIS-UNIVERSAL   . VITAMIN D DEFICIENCY     Past Surgical History:  Procedure Laterality Date  . ABDOMINAL HERNIA REPAIR  2006  . ANGIOPLASTY  2004   stent 2004  . COLONOSCOPY    . rhinoseptoplasty    . TONSILLECTOMY      Patient Active Problem List   Diagnosis Date Noted  . Glaucoma 08/21/2013  . Mitral regurgitation 05/23/2013  . Ejection fraction   . Shortness of breath 05/13/2013  . Dilated aortic root ([place]) 05/13/2013  . Pectus excavatum 05/13/2013  . Numbness and tingling in hands 05/13/2013  . BPH (benign prostatic hypertrophy) 07/06/2011  . CAD (coronary artery disease)   . Hyperlipidemia 06/12/2009  . VITAMIN D DEFICIENCY 06/04/2009  . ALLERGIC RHINITIS 06/04/2009  . GERD 06/04/2009  . ANXIETY STATE, UNSPECIFIED 05/15/2009  . Osteopenia 05/15/2009  . Ulcerative colitis ([place]) 05/17/2007      Current Outpatient Prescriptions  Medication Sig Dispense Refill  . aspirin 81 MG tablet Take 81 mg by mouth daily.      . clopidogrel (PLAVIX) 75 MG tablet Take 1 tablet (75 mg total) by mouth daily. 90 tablet 3  . Coenzyme Q10 (CO Q-10) 300 MG CAPS Take 1 tablet by mouth daily.     Marland Kitchen  latanoprost (XALATAN) 0.005 % ophthalmic solution Place 1 drop into both eyes at bedtime.    Marland Kitchen loratadine (CLARITIN) 10 MG tablet Take 10 mg by mouth daily.    . Multiple Vitamin (MULTIVITAMIN) tablet Take 1 tablet by mouth daily.    Marland Kitchen PENTASA 500 MG CR capsule TAKE 2 CAPSULES BY MOUTH 4 TIMES A DAY 720 capsule 1  . simvastatin (ZOCOR) 10 MG tablet Take 1 tablet (10 mg total) by mouth daily. 90 tablet 3  . timolol (TIMOPTIC) 0.5 % ophthalmic solution INSTILL 1 DROP INTO LEFT EYE EVERY MORNING  3   No current facility-administered medications for this visit.     Allergies:   Dust mite extract    Social History:  The patient  reports that he has never smoked. He has never used smokeless tobacco. He reports that he  drinks about 0.6 oz of alcohol per week . He reports that he does not use drugs.   Family History:  The patient's family history includes Asthma in his sister; Diabetes in his mother; Heart disease in his mother; Heart disease (age of onset: 81) in his father; Lung disease in his mother; Rheum arthritis in his mother.    ROS:  Please see the history of present illness.   Patient denies fever, chills, headache, sweats, rash, change in vision, change in hearing, chest pain, cough, nausea or vomiting, urinary symptoms. All other systems are reviewed and are negative other than the history of present illness.     PHYSICAL EXAM: VS:  BP 120/80   Pulse (!) 55   Ht 5' 8.5" (1.74 m)   Wt 159 lb 6.4 oz (72.3 kg)   SpO2 96%   BMI 23.88 kg/m  , Affect appropriate Healthy:  appears stated age HEENT: normal Neck supple with no adenopathy JVP normal no bruits no thyromegaly Lungs clear with no wheezing and good diaphragmatic motion Heart:  S1/S2 no murmur, no rub, gallop or click PMI normal Abdomen: benighn, BS positve, no tenderness, no AAA no bruit.  No HSM or HJR Distal pulses intact with no bruits No edema Neuro non-focal Skin warm and dry No muscular weakness   EKG:    08/22/14  SR ate 53  LAD normal ST segments   03/18/15  NSR rate 61  LAD otherwise normal  05/11/16  SR LAD otherwise normal   Recent Labs: 03/25/2016: ALT 25; BUN 22; Creatinine, Ser 1.07; Hemoglobin 15.7; Platelets 183.0; Potassium 3.8; Sodium 141; TSH 0.99    Lipid Panel    Component Value Date/Time   CHOL 166 03/25/2016 0914   TRIG 91.0 03/25/2016 0914   TRIG 46 12/19/2008   HDL 45.00 03/25/2016 0914   CHOLHDL 4 03/25/2016 0914   VLDL 18.2 03/25/2016 0914   LDLCALC 103 (H) 03/25/2016 0914      Wt Readings from Last 3 Encounters:  05/11/16 159 lb 6.4 oz (72.3 kg)  03/24/16 161 lb (73 kg)  08/13/15 161 lb (73 kg)      Current medicines are reviewed  The patient understands his  medications.     ASSESSMENT AND PLAN:  Dyspnea:  Normal cardiopulm stress test 2015  EF 50-55% by echo  Etiology not clear but would appear to be mechanical f/u echo since its been 3 years  CAD:  Stent to LAD 2004 nonischemic myovue 2013 some SSCP with activity f/u exercise myovue  Chol:   Cholesterol is at goal.  Continue current dose of statin and diet Rx.  No myalgias  or side effects.  F/U  LFT's in 6 months. Lab Results  Component Value Date   LDLCALC 103 (H) 03/25/2016  Neuropathy:  Hands and feet patient thinks breathing issues related to this as well          f/u with me in 6 months if echo and myovue ok  Jenkins Rouge

## 2016-05-11 ENCOUNTER — Encounter: Payer: Self-pay | Admitting: Cardiovascular Disease

## 2016-05-11 ENCOUNTER — Ambulatory Visit (INDEPENDENT_AMBULATORY_CARE_PROVIDER_SITE_OTHER): Payer: BLUE CROSS/BLUE SHIELD | Admitting: Cardiovascular Disease

## 2016-05-11 VITALS — BP 120/80 | HR 55 | Ht 68.5 in | Wt 159.4 lb

## 2016-05-11 DIAGNOSIS — I340 Nonrheumatic mitral (valve) insufficiency: Secondary | ICD-10-CM

## 2016-05-11 DIAGNOSIS — R0600 Dyspnea, unspecified: Secondary | ICD-10-CM

## 2016-05-11 DIAGNOSIS — I2510 Atherosclerotic heart disease of native coronary artery without angina pectoris: Secondary | ICD-10-CM

## 2016-05-11 MED ORDER — CLOPIDOGREL BISULFATE 75 MG PO TABS: 75.0000 mg | ORAL_TABLET | Freq: Every day | ORAL | 3 refills | Status: DC

## 2016-05-11 MED ORDER — SIMVASTATIN 10 MG PO TABS: 10.0000 mg | ORAL_TABLET | Freq: Every day | ORAL | 3 refills | Status: DC

## 2016-05-11 NOTE — Patient Instructions (Addendum)
Medication Instructions:  Your physician recommends that you continue on your current medications as directed. Please refer to the Current Medication list given to you today.  Labwork: NONE  Testing/Procedures: Your physician has requested that you have en exercise stress myoview. For further information please visit [email or web address]. Please follow instruction sheet, as given.  Your physician has requested that you have an echocardiogram. Echocardiography is a painless test that uses sound waves to create images of your heart. It provides your doctor with information about the size and shape of your heart and how well your heart's chambers and valves are working. This procedure takes approximately one hour. There are no restrictions for this procedure.  Follow-Up: Your physician wants you to follow-up in: 6 months with Dr. Nishan. You will receive a reminder letter in the mail two months in advance. If you don't receive a letter, please call our office to schedule the follow-up appointment.   If you need a refill on your cardiac medications before your next appointment, please call your pharmacy.     

## 2016-05-16 ENCOUNTER — Ambulatory Visit (INDEPENDENT_AMBULATORY_CARE_PROVIDER_SITE_OTHER): Payer: BLUE CROSS/BLUE SHIELD | Admitting: Internal Medicine

## 2016-05-16 ENCOUNTER — Encounter: Payer: Self-pay | Admitting: Internal Medicine

## 2016-05-16 VITALS — BP 146/86 | HR 81 | Temp 98.1°F | Resp 16 | Ht 68.5 in | Wt 160.2 lb

## 2016-05-16 DIAGNOSIS — B029 Zoster without complications: Secondary | ICD-10-CM | POA: Diagnosis not present

## 2016-05-16 MED ORDER — PREDNISONE 5 MG PO TABS: 15.0000 mg | ORAL_TABLET | Freq: Every day | ORAL | 0 refills | Status: DC

## 2016-05-16 MED ORDER — PREDNISONE 2.5 MG PO TABS: 7.5000 mg | ORAL_TABLET | Freq: Two times a day (BID) | ORAL | 0 refills | Status: AC

## 2016-05-16 MED ORDER — PREDNISONE 10 MG PO TABS: 30.0000 mg | ORAL_TABLET | Freq: Two times a day (BID) | ORAL | 0 refills | Status: AC

## 2016-05-16 MED ORDER — VALACYCLOVIR HCL 1 G PO TABS: 1000.0000 mg | ORAL_TABLET | Freq: Two times a day (BID) | ORAL | 0 refills | Status: AC

## 2016-05-16 MED ORDER — OXYCODONE-ACETAMINOPHEN 7.5-325 MG PO TABS: 1.0000 | ORAL_TABLET | Freq: Three times a day (TID) | ORAL | 0 refills | Status: DC | PRN

## 2016-05-16 NOTE — Progress Notes (Signed)
Subjective:  Patient ID: Joseph Booker, male    DOB: [birth year]  Age: 66 y.o. MRN: [id number]  CC: Rash   HPI Abbas Beyene presents for a 2 day hx of painful rash over his left back - He describes the pain as a stinging discomfort with pins and needle sensation. His wife told him that area was red.  Outpatient Medications Prior to Visit  Medication Sig Dispense Refill  . aspirin 81 MG tablet Take 81 mg by mouth daily.      . clopidogrel (PLAVIX) 75 MG tablet Take 1 tablet (75 mg total) by mouth daily. 90 tablet 3  . Coenzyme Q10 (CO Q-10) 300 MG CAPS Take 1 tablet by mouth daily.     Marland Kitchen latanoprost (XALATAN) 0.005 % ophthalmic solution Place 1 drop into both eyes at bedtime.    Marland Kitchen loratadine (CLARITIN) 10 MG tablet Take 10 mg by mouth daily.    . Multiple Vitamin (MULTIVITAMIN) tablet Take 1 tablet by mouth daily.    Marland Kitchen PENTASA 500 MG CR capsule TAKE 2 CAPSULES BY MOUTH 4 TIMES A DAY 720 capsule 1  . simvastatin (ZOCOR) 10 MG tablet Take 1 tablet (10 mg total) by mouth daily. 90 tablet 3  . timolol (TIMOPTIC) 0.5 % ophthalmic solution INSTILL 1 DROP INTO LEFT EYE EVERY MORNING  3   No facility-administered medications prior to visit.     ROS Review of Systems  Constitutional: Negative.  Negative for chills, fatigue and unexpected weight change.  HENT: Negative.   Eyes: Negative.   Respiratory: Negative.  Negative for cough, chest tightness, shortness of breath, wheezing and stridor.   Cardiovascular: Negative for chest pain, palpitations and leg swelling.  Gastrointestinal: Negative for abdominal pain, constipation, diarrhea, nausea and vomiting.  Endocrine: Negative.   Genitourinary: Negative.  Negative for difficulty urinating.  Musculoskeletal: Negative for back pain and myalgias.  Skin: Positive for color change and rash. Negative for pallor.  Allergic/Immunologic: Negative.   Neurological: Negative.   Hematological: Negative for adenopathy. Does not bruise/bleed easily.    Psychiatric/Behavioral: Negative.     Objective:  BP (!) 146/86 (BP Location: Left Arm, Patient Position: Sitting, Cuff Size: Normal)   Pulse 81   Temp 98.1 F (36.7 C) (Oral)   Resp 16   Ht 5' 8.5" (1.74 m)   Wt 160 lb 4 oz (72.7 kg)   SpO2 98%   BMI 24.01 kg/m   BP Readings from Last 3 Encounters:  05/16/16 (!) 146/86  05/11/16 120/80  03/24/16 128/88    Wt Readings from Last 3 Encounters:  05/16/16 160 lb 4 oz (72.7 kg)  05/11/16 159 lb 6.4 oz (72.3 kg)  03/24/16 161 lb (73 kg)    Physical Exam  Constitutional: He is oriented to person, place, and time. No distress.  HENT:  Mouth/Throat: Oropharynx is clear and moist. No oropharyngeal exudate.  Eyes: Conjunctivae are normal. Right eye exhibits no discharge. Left eye exhibits no discharge. No scleral icterus.  Neck: Normal range of motion. Neck supple. No JVD present. No tracheal deviation present. No thyromegaly present.  Cardiovascular: Normal rate, regular rhythm, normal heart sounds and intact distal pulses.  Exam reveals no gallop and no friction rub.   No murmur heard. Pulmonary/Chest: Breath sounds normal. No stridor. No respiratory distress. He has no wheezes. He has no rales.     He exhibits no tenderness.  Abdominal: Soft. Bowel sounds are normal. He exhibits no distension and no mass. There is no tenderness. There  is no rebound and no guarding.  Musculoskeletal: Normal range of motion. He exhibits no edema, tenderness or deformity.  Lymphadenopathy:    He has no cervical adenopathy.  Neurological: He is oriented to person, place, and time.  Skin: Rash noted. He is not diaphoretic. No erythema. No pallor.  Vitals reviewed.   Lab Results  Component Value Date   WBC 4.9 03/25/2016   HGB 15.7 03/25/2016   HCT 46.0 03/25/2016   PLT 183.0 03/25/2016   GLUCOSE 97 03/25/2016   CHOL 166 03/25/2016   TRIG 91.0 03/25/2016   HDL 45.00 03/25/2016   LDLCALC 103 (H) 03/25/2016   ALT 25 03/25/2016   AST 27  03/25/2016   NA 141 03/25/2016   K 3.8 03/25/2016   CL 107 03/25/2016   CREATININE 1.07 03/25/2016   BUN 22 03/25/2016   CO2 29 03/25/2016   TSH 0.99 03/25/2016   PSA 2.98 01/01/2015   INR 1.4 (H) 03/22/2010   HGBA1C 5.6 03/25/2016    Dg Chest 1 View  Result Date: 03/09/2014 CLINICAL DATA:  PA with nipple markers. Followup earlier chest x-ray. EXAM: CHEST  1 VIEW COMPARISON:  03/09/2014 FINDINGS: The cardiac silhouette, mediastinal and hilar contours are within normal limits and stable. Bilateral nipple markers are in place. I do not see the abnormality questioned on the earlier chest film. I think this is most likely the costal chondral cartilage. The lungs are clear. No pleural effusion. IMPRESSION: No acute cardiopulmonary findings and no worrisome pulmonary lesions. Electronically Signed   By: Marijo Sanes M.D.   On: 03/09/2014 16:21   Dg Chest 2 View  Result Date: 03/09/2014 CLINICAL DATA:  Productive cough with a grade fever, history coronary disease EXAM: CHEST  2 VIEW COMPARISON:  04/17/2013 FINDINGS: Normal heart size, mediastinal contours, and pulmonary vascularity. Question RIGHT nipple shadow. Lungs appear emphysematous but clear. No pleural effusion or pneumothorax. Bones unremarkable. IMPRESSION: Emphysematous changes. Question RIGHT nipple shadow; repeat PA chest radiograph with nipple markers recommended to exclude pulmonary nodule. Electronically Signed   By: Lavonia Dana M.D.   On: 03/09/2014 15:58    Assessment & Plan:   Macen was seen today for rash.  Diagnoses and all orders for this visit:  Herpes zoster without complication- will treat the infection with valacyclovir, he is over 50 so will treat with a 3 day course of prednisone to reduce the risk of acute pain and will treat pain with Percocet -     valACYclovir (VALTREX) 1000 MG tablet; Take 1 tablet (1,000 mg total) by mouth 2 (two) times daily. -     predniSONE (DELTASONE) 10 MG tablet; Take 3 tablets (30 mg  total) by mouth 2 (two) times daily with a meal. -     predniSONE (DELTASONE) 2.5 MG tablet; Take 3 tablets (7.5 mg total) by mouth 2 (two) times daily with a meal. -     predniSONE (DELTASONE) 5 MG tablet; Take 3 tablets (15 mg total) by mouth daily with breakfast. -     oxyCODONE-acetaminophen (PERCOCET) 7.5-325 MG tablet; Take 1 tablet by mouth every 8 (eight) hours as needed.   I am having Mr. Simkin start on valACYclovir, predniSONE, predniSONE, predniSONE, and oxyCODONE-acetaminophen. I am also having him maintain his aspirin, multivitamin, latanoprost, Co Q-10, timolol, loratadine, PENTASA, simvastatin, clopidogrel, and doxycycline.  Meds ordered this encounter  Medications  . doxycycline (VIBRA-TABS) 100 MG tablet  . valACYclovir (VALTREX) 1000 MG tablet    Sig: Take 1 tablet (1,000 mg  total) by mouth 2 (two) times daily.    Dispense:  14 tablet    Refill:  0  . predniSONE (DELTASONE) 10 MG tablet    Sig: Take 3 tablets (30 mg total) by mouth 2 (two) times daily with a meal.    Dispense:  42 tablet    Refill:  0  . predniSONE (DELTASONE) 2.5 MG tablet    Sig: Take 3 tablets (7.5 mg total) by mouth 2 (two) times daily with a meal.    Dispense:  42 tablet    Refill:  0  . predniSONE (DELTASONE) 5 MG tablet    Sig: Take 3 tablets (15 mg total) by mouth daily with breakfast.    Dispense:  21 tablet    Refill:  0  . oxyCODONE-acetaminophen (PERCOCET) 7.5-325 MG tablet    Sig: Take 1 tablet by mouth every 8 (eight) hours as needed.    Dispense:  20 tablet    Refill:  0     Follow-up: Return in about 3 weeks (around 06/06/2016).  Scarlette Calico, MD

## 2016-05-16 NOTE — Patient Instructions (Signed)
Shingles Shingles, which is also known as herpes zoster, is an infection that causes a painful skin rash and fluid-filled blisters. Shingles is not related to genital herpes, which is a sexually transmitted infection. Shingles only develops in people who:  Have had chickenpox.  Have received the chickenpox vaccine. (This is rare.)  What are the causes? Shingles is caused by varicella-zoster virus (VZV). This is the same virus that causes chickenpox. After exposure to VZV, the virus stays in the body in an inactive (dormant) state. Shingles develops if the virus reactivates. This can happen many years after the initial exposure to VZV. It is not known what causes this virus to reactivate. What increases the risk? People who have had chickenpox or received the chickenpox vaccine are at risk for shingles. Infection is more common in people who:  Are older than age 50.  Have a weakened defense (immune) system, such as those with HIV, AIDS, or cancer.  Are taking medicines that weaken the immune system, such as transplant medicines.  Are under great stress.  What are the signs or symptoms? Early symptoms of this condition include itching, tingling, and pain in an area on your skin. Pain may be described as burning, stabbing, or throbbing. A few days or weeks after symptoms start, a painful red rash appears, usually on one side of the body in a bandlike or beltlike pattern. The rash eventually turns into fluid-filled blisters that break open, scab over, and dry up in about 2-3 weeks. At any time during the infection, you may also develop:  A fever.  Chills.  A headache.  An upset stomach.  How is this diagnosed? This condition is diagnosed with a skin exam. Sometimes, skin or fluid samples are taken from the blisters before a diagnosis is made. These samples are examined under a microscope or sent to a lab for testing. How is this treated? There is no specific cure for this condition.  Your health care provider will probably prescribe medicines to help you manage pain, recover more quickly, and avoid long-term problems. Medicines may include:  Antiviral drugs.  Anti-inflammatory drugs.  Pain medicines.  If the area involved is on your face, you may be referred to a specialist, such as an eye doctor (ophthalmologist) or an ear, nose, and throat (ENT) doctor to help you avoid eye problems, chronic pain, or disability. Follow these instructions at home: Medicines  Take medicines only as directed by your health care provider.  Apply an anti-itch or numbing cream to the affected area as directed by your health care provider. Blister and Rash Care  Take a cool bath or apply cool compresses to the area of the rash or blisters as directed by your health care provider. This may help with pain and itching.  Keep your rash covered with a loose bandage (dressing). Wear loose-fitting clothing to help ease the pain of material rubbing against the rash.  Keep your rash and blisters clean with mild soap and cool water or as directed by your health care provider.  Check your rash every day for signs of infection. These include redness, swelling, and pain that lasts or increases.  Do not pick your blisters.  Do not scratch your rash. General instructions  Rest as directed by your health care provider.  Keep all follow-up visits as directed by your health care provider. This is important.  Until your blisters scab over, your infection can cause chickenpox in people who have never had it or been vaccinated   against it. To prevent this from happening, avoid contact with other people, especially: ? Babies. ? Pregnant women. ? Children who have eczema. ? Elderly people who have transplants. ? People who have chronic illnesses, such as leukemia or AIDS. Contact a health care provider if:  Your pain is not relieved with prescribed medicines.  Your pain does not get better after  the rash heals.  Your rash looks infected. Signs of infection include redness, swelling, and pain that lasts or increases. Get help right away if:  The rash is on your face or nose.  You have facial pain, pain around your eye area, or loss of feeling on one side of your face.  You have ear pain or you have ringing in your ear.  You have loss of taste.  Your condition gets worse. This information is not intended to replace advice given to you by your health care provider. Make sure you discuss any questions you have with your health care provider. Document Released: 01/03/2005 Document Revised: 08/30/2015 Document Reviewed: 11/14/2013 Elsevier Interactive Patient Education  2017 [employer]vier Inc[employer]  

## 2016-05-16 NOTE — Progress Notes (Signed)
Pre visit review using our clinic review tool, if applicable. No additional management support is needed unless otherwise documented below in the visit note. 

## 2016-05-17 MED ORDER — PREDNISONE 5 MG PO TABS: 15.0000 mg | ORAL_TABLET | Freq: Two times a day (BID) | ORAL | 0 refills | Status: AC

## 2016-05-17 NOTE — Addendum Note (Signed)
Addended by: Janith Lima on: 05/17/2016 12:19 PM   Modules accepted: Orders

## 2016-05-25 ENCOUNTER — Other Ambulatory Visit (HOSPITAL_COMMUNITY): Payer: BLUE CROSS/BLUE SHIELD

## 2016-06-02 ENCOUNTER — Encounter (HOSPITAL_COMMUNITY): Payer: BLUE CROSS/BLUE SHIELD

## 2016-06-02 ENCOUNTER — Other Ambulatory Visit (HOSPITAL_COMMUNITY): Payer: BLUE CROSS/BLUE SHIELD

## 2016-06-08 ENCOUNTER — Encounter: Payer: Self-pay | Admitting: Gastroenterology

## 2016-06-08 ENCOUNTER — Ambulatory Visit: Payer: BLUE CROSS/BLUE SHIELD | Admitting: Internal Medicine

## 2016-06-28 ENCOUNTER — Telehealth (HOSPITAL_COMMUNITY): Payer: Self-pay | Admitting: *Deleted

## 2016-06-28 NOTE — Telephone Encounter (Signed)
Left message on voicemail in reference to upcoming appointment scheduled for 06/30/16. Phone number given for a call back so details instructions can be given.  Joseph Booker

## 2016-06-30 ENCOUNTER — Ambulatory Visit (HOSPITAL_COMMUNITY): Payer: BLUE CROSS/BLUE SHIELD | Attending: Internal Medicine

## 2016-06-30 ENCOUNTER — Other Ambulatory Visit: Payer: Self-pay

## 2016-06-30 ENCOUNTER — Ambulatory Visit (HOSPITAL_BASED_OUTPATIENT_CLINIC_OR_DEPARTMENT_OTHER): Payer: BLUE CROSS/BLUE SHIELD

## 2016-06-30 DIAGNOSIS — I071 Rheumatic tricuspid insufficiency: Secondary | ICD-10-CM | POA: Diagnosis not present

## 2016-06-30 DIAGNOSIS — I2510 Atherosclerotic heart disease of native coronary artery without angina pectoris: Secondary | ICD-10-CM | POA: Diagnosis not present

## 2016-06-30 DIAGNOSIS — E785 Hyperlipidemia, unspecified: Secondary | ICD-10-CM | POA: Insufficient documentation

## 2016-06-30 DIAGNOSIS — R0609 Other forms of dyspnea: Secondary | ICD-10-CM | POA: Insufficient documentation

## 2016-06-30 DIAGNOSIS — R079 Chest pain, unspecified: Secondary | ICD-10-CM | POA: Insufficient documentation

## 2016-06-30 DIAGNOSIS — I340 Nonrheumatic mitral (valve) insufficiency: Secondary | ICD-10-CM

## 2016-06-30 DIAGNOSIS — R0600 Dyspnea, unspecified: Secondary | ICD-10-CM | POA: Diagnosis not present

## 2016-06-30 LAB — MYOCARDIAL PERFUSION IMAGING
CHL CUP MPHR: 156 {beats}/min
CHL CUP RESTING HR STRESS: 43 {beats}/min
CSEPPHR: 141 {beats}/min
Estimated workload: 14.5 METS
Exercise duration (min): 13 min
Exercise duration (sec): 0 s
LHR: 0.26
LVDIAVOL: 94 mL (ref 62–150)
LVSYSVOL: 42 mL
Percent HR: 90 %
RPE: 18
SDS: 3
SRS: 4
SSS: 7
TID: 0.78

## 2016-06-30 MED ORDER — TECHNETIUM TC 99M TETROFOSMIN IV KIT
33.0000 | PACK | Freq: Once | INTRAVENOUS | Status: AC | PRN
  Administered 2016-06-30: 33 via INTRAVENOUS
  Filled 2016-06-30: qty 33

## 2016-06-30 MED ORDER — TECHNETIUM TC 99M TETROFOSMIN IV KIT
10.9000 | PACK | Freq: Once | INTRAVENOUS | Status: AC | PRN
Start: 2016-06-30 — End: 2016-06-30
  Administered 2016-06-30: 10.9 via INTRAVENOUS
  Filled 2016-06-30: qty 11

## 2016-07-06 ENCOUNTER — Telehealth: Payer: Self-pay

## 2016-07-06 DIAGNOSIS — I719 Aortic aneurysm of unspecified site, without rupture: Secondary | ICD-10-CM

## 2016-07-06 DIAGNOSIS — I7121 Aneurysm of the ascending aorta, without rupture: Secondary | ICD-10-CM

## 2016-07-06 DIAGNOSIS — E7800 Pure hypercholesterolemia, unspecified: Secondary | ICD-10-CM

## 2016-07-06 DIAGNOSIS — Z79899 Other long term (current) drug therapy: Secondary | ICD-10-CM

## 2016-07-06 DIAGNOSIS — Q2543 Congenital aneurysm of aorta: Secondary | ICD-10-CM

## 2016-07-06 MED ORDER — SIMVASTATIN 20 MG PO TABS: 20.0000 mg | ORAL_TABLET | Freq: Every day | ORAL | 3 refills | Status: DC

## 2016-07-06 NOTE — Telephone Encounter (Signed)
-----   Message from Josue Hector, MD sent at 07/06/2016  1:19 PM EDT ----- Yes can increase zocor to 20 mg f/u labs 3 months with new target for LDL 70 or less Echo complete enough no need for other views  ----- Message ----- From: Michaelyn Barter, RN Sent: 07/06/2016  11:38 AM To: Josue Hector, MD  Patient call back about results. Per 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 Patient wanted to know since his LDL is 103 does he need to increase his does of Zocor (right now 10 mg daily).  Patient wanted to know, since he read the report in Mychart,  if Apical 2 and 3 chamber views need to be done. Will forward to [name] for advisement.

## 2016-07-06 NOTE — Telephone Encounter (Signed)
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

## 2016-07-06 NOTE — Telephone Encounter (Signed)
Ordered medication and lab work for patient. Patient will come in on 10/10/16 for repeat lab work.

## 2016-08-25 ENCOUNTER — Ambulatory Visit (INDEPENDENT_AMBULATORY_CARE_PROVIDER_SITE_OTHER): Payer: BLUE CROSS/BLUE SHIELD | Admitting: Gastroenterology

## 2016-08-25 ENCOUNTER — Telehealth: Payer: Self-pay

## 2016-08-25 ENCOUNTER — Encounter: Payer: Self-pay | Admitting: Gastroenterology

## 2016-08-25 VITALS — BP 124/82 | HR 68 | Ht 68.25 in | Wt 162.0 lb

## 2016-08-25 DIAGNOSIS — K51919 Ulcerative colitis, unspecified with unspecified complications: Secondary | ICD-10-CM | POA: Diagnosis not present

## 2016-08-25 DIAGNOSIS — K625 Hemorrhage of anus and rectum: Secondary | ICD-10-CM

## 2016-08-25 DIAGNOSIS — R140 Abdominal distension (gaseous): Secondary | ICD-10-CM | POA: Diagnosis not present

## 2016-08-25 MED ORDER — AMBULATORY NON FORMULARY MEDICATION: Status: DC

## 2016-08-25 NOTE — Progress Notes (Signed)
Joseph Booker    [id number]    May 11, 1950  Primary Care Physician:Burns, Claudina Lick, MD  Referring Physician: Binnie Rail, MD [place], [place] [address]  Chief complaint:  Ulcerative colitis, blood per rectum  HPI: 66 year old male with history of ulcerative colitis diagnosed> 20 years ago. Patient stopped taking Imuran in 2016 as he thought it may be playing a role in causing shortness of breath and worsening his respiratory symptoms. He had extensive pulmonary and cardiac workup that was unrevealing for any significant pathology to explain shortness of breath. He hasn't noticed any improvement after stopping Imuran. He is taking Pentasa. Denies any diarrhea or mucus discharge. He has occasional fecal urgency and smearing, worse when he is exercising or walks for a few miles. He also has noticed small amount of bright red blood per rectum after he exercises. Denies any blood mixed in stool. No nausea, vomiting, abdominal pain, or  melena Last colonoscopy in July 2016 with biopsies showed inactive colitis.  Outpatient Encounter Prescriptions as of 08/25/2016  Medication Sig  . aspirin 81 MG tablet Take 81 mg by mouth daily.    . clopidogrel (PLAVIX) 75 MG tablet Take 1 tablet (75 mg total) by mouth daily.  . Coenzyme Q10 (CO Q-10) 300 MG CAPS Take 1 tablet by mouth daily.   Marland Kitchen latanoprost (XALATAN) 0.005 % ophthalmic solution Place 1 drop into both eyes at bedtime.  Marland Kitchen loratadine (CLARITIN) 10 MG tablet Take 10 mg by mouth as needed.   . Multiple Vitamin (MULTIVITAMIN) tablet Take 1 tablet by mouth daily.  Marland Kitchen PENTASA 500 MG CR capsule TAKE 2 CAPSULES BY MOUTH 4 TIMES A DAY  . simvastatin (ZOCOR) 20 MG tablet Take 1 tablet (20 mg total) by mouth daily.  . timolol (TIMOPTIC) 0.5 % ophthalmic solution INSTILL 1 DROP INTO LEFT EYE EVERY MORNING  . [DISCONTINUED] doxycycline (VIBRA-TABS) 100 MG tablet   . [DISCONTINUED] oxyCODONE-acetaminophen (PERCOCET) 7.5-325 MG tablet  Take 1 tablet by mouth every 8 (eight) hours as needed.   No facility-administered encounter medications on file as of 08/25/2016.     Allergies as of 08/25/2016 - Review Complete 08/25/2016  Allergen Reaction Noted  . Dust mite extract  03/09/2014    Past Medical History:  Diagnosis Date  . ALLERGIC RHINITIS   . ANEMIA, IRON DEFICIENCY   . Anxiety state, unspecified   . Arm pain    Right arm discomfort with some radiation from the biceps down to the wrist..  January, 2014  . CAD (coronary artery disease)    [name] / nuclear 2005, excellent tolerance no scar or ischemia, EF 51% / nuclear, February, 2012, no scar or ischemia / catheterization March 26, 2010.. 10% in-stent restenosis, minimal other nonobstructive coronary disease, ejection fraction 55%., excellent result; ETT 9/13: ex 13:14, no CP, no ischemic ECG changes  . Cataract   . Ejection fraction   . Esophagitis   . GERD (gastroesophageal reflux disease)   . Glaucoma   . Hyperlipidemia   . INGUINAL HERNIA   . Osteopenia   . OSTEOPENIA   . ULCERATIVE COLITIS-UNIVERSAL   . VITAMIN D DEFICIENCY     Past Surgical History:  Procedure Laterality Date  . ABDOMINAL HERNIA REPAIR  2006  . ANGIOPLASTY  2004   stent 2004  . COLONOSCOPY    . rhinoseptoplasty    . TONSILLECTOMY      Family History  Problem Relation Age  of Onset  . Diabetes Mother   . Heart disease Mother   . Lung disease Mother        ILD  . Rheum arthritis Mother   . Heart disease Father 39       MI  . Asthma Sister   . Colon cancer Neg Hx   . Esophageal cancer Neg Hx   . Pancreatic cancer Neg Hx   . Kidney disease Neg Hx   . Liver disease Neg Hx     Social History   Social History  . Marital status: Married    Spouse name: N/A  . Number of children: 1  . Years of education: N/A   Occupational History  . retired Retired   Social History Main Topics  . Smoking status: Never Smoker  . Smokeless tobacco: Never Used      Comment: Married, 1 stepson, 5 g-kids. Retired Emergency planning/management officer, now Herbalist  . Alcohol use 0.6 oz/week    1 Glasses of wine per week     Comment: weekly at most  . Drug use: No  . Sexual activity: Yes    Partners: Female   Other Topics Concern  . Not on file   Social History Narrative   Exercising regularly: walks at least 5 miles a day, 6 days a week      Review of systems: Review of Systems  Constitutional: Negative for fever and chills.  HENT: Negative.   Eyes: Negative for blurred vision.  Respiratory: Negative for cough,Positive for shortness of breath and wheezing.   Cardiovascular: Negative for chest pain and palpitations.  Gastrointestinal: as per HPI Genitourinary: Negative for dysuria, urgency, frequency and hematuria.  Musculoskeletal: Negative for myalgias, back pain and joint pain.  Skin: Negative for itching and rash.  Neurological: Negative for dizziness, tremors, focal weakness, seizures and loss of consciousness.  Endo/Heme/Allergies: Positive for seasonal allergies.  Psychiatric/Behavioral: Negative for depression, suicidal ideas and hallucinations.  All other systems reviewed and are negative.   Physical Exam: Vitals:   08/25/16 1020  BP: 124/82  Pulse: 68   Body mass index is 24.45 kg/m. Gen:      No acute distress HEENT:  EOMI, sclera anicteric Neck:     No masses; no thyromegaly Lungs:    Clear to auscultation bilaterally; normal respiratory effort CV:         Regular rate and rhythm; no murmurs Abd:      + bowel sounds; soft, non-tender; no palpable masses, no distension Ext:    No edema; adequate peripheral perfusion Skin:      Warm and dry; no rash Neuro: alert and oriented x 3 Psych: normal mood and affect  Data Reviewed:  Reviewed labs, radiology imaging, old records and pertinent past GI work up   Assessment and Plan/Recommendations:  66 year old male with history of pan ulcerative colitis diagnosed greater than 20 years  ago here for follow-up visit Patient is having intermittent episodes of bright red blood per rectum with fecal urgency and smearing could be secondary to internal hemorrhoids or proctitis Patient is due for surveillance colonoscopy, we'll schedule it The risks and benefits as well as alternatives of endoscopic procedure(s) have been discussed and reviewed. All questions answered. The patient agrees to proceed.  Continue Pentasa  If symptomatic hemorrhoids will consider hemorrhoidal band ligation based on findings of colonoscopy  Intermittent bloating and borborygmi: trial of IB Gard 1 capsule TID as needed  25 minutes was spent face-to-face with the patient. Greater  than 50% of the time used for counseling as well as treatment plan and follow-up. He had multiple questions which were answered to his satisfaction  K. Denzil Magnuson , MD [phone] Mon-Fri 8a-5p [phone] after 5p, weekends, holidays  CC: Burns, Claudina Lick, MD

## 2016-08-25 NOTE — Patient Instructions (Addendum)
  You have been scheduled for a colonoscopy. Please follow written instructions given to you at your visit today.  Please pick up your prep supplies at the pharmacy within the next 1-3 days. If you use inhalers (even only as needed), please bring them with you on the day of your procedure. Your physician has requested that you go to www.startemmi.com and enter the access code given to you at your visit today. This web site gives a general overview about your procedure. However, you should still follow specific instructions given to you by our office regarding your preparation for the procedure.   Please purchase the following medications over the counter and take as directed: IBgard take 1 capsule three times a day as needed. We have given you samples today.     You will be contacted by our office prior to your procedure for directions on holding your Plavix.  If you do not hear from our office 1 week prior to your scheduled procedure, please call [phone] to discuss.   If you are age 96 or older, your body mass index should be between 23-30. Your Body mass index is 24.45 kg/m. If this is out of the aforementioned range listed, please consider follow up with your Primary Care Provider.  If you are age 60 or younger, your body mass index should be between 19-25. Your Body mass index is 24.45 kg/m. If this is out of the aformentioned range listed, please consider follow up with your Primary Care Provider.

## 2016-08-25 NOTE — Telephone Encounter (Signed)
   Joseph Booker 03/30/1950 [id number]  Dear [name]:  We have scheduled the above named patient for a(n) colonoscopy procedure. Our records show that (s)he is on anticoagulation therapy.  Please advise as to whether the patient may come off their therapy of Plavix for 5 days prior to their procedure which is scheduled for 10-20-16.  Please route your response to Lemar Lofty, CMA and Genella Mech, CMA or fax response to [phone].  Sincerely,    [facility]

## 2016-08-28 NOTE — Telephone Encounter (Signed)
Ok to hold plavix for 5 days before colonoscopy

## 2016-08-30 ENCOUNTER — Encounter: Payer: Self-pay | Admitting: Cardiovascular Disease

## 2016-08-30 NOTE — Telephone Encounter (Signed)
Called pt, Joseph Booker, and left message to hold Plavix 5 days prior to colonoscopy starting on 10-15-16.  Gave office number and instructed him to call me back with any questions.

## 2016-09-02 ENCOUNTER — Encounter: Payer: Self-pay | Admitting: Internal Medicine

## 2016-09-04 ENCOUNTER — Other Ambulatory Visit: Payer: Self-pay | Admitting: Internal Medicine

## 2016-09-04 MED ORDER — ZOSTER VAC RECOMB ADJUVANTED 50 MCG/0.5ML IM SUSR: 0.5000 mL | Freq: Once | INTRAMUSCULAR | 1 refills | Status: AC

## 2016-09-26 NOTE — Progress Notes (Signed)
Subjective:    Patient ID: Joseph Booker, male    DOB: 02/25/1950, 66 y.o.   MRN: [id number]  HPI He is here for an acute visit.   Stomach issues:  It started about one week ago.  He had abdominal discomfort and had some diarrhea.  He thought he ate something. It lasted a few days.  He took a little pepto bismol as needed. By last Sunday he started taking a powerful probiotic used for patients with UC.  He has UC and has had flares in the past, but he did not think it was UC.  He denies blood in his stool. He denies abdominal pain, fevers or chills.  He has had mild nausea, but his appetite is normal. He thinks it may be a IBS flare, not UC.    He was having up to 6 stools per day.  After starting the probiotic he started to have some constipation.  He does feels a little bloated.  Yesterday he had no bowel movements.  Today he has had one.    He did take an allegra at that time, but does not think that was the cause. He usually does not take this.  He did also eat something with flax seeds and that can sometimes act as a laxative.   He has not had this in the past and wondered if that was the cause.    His UC has been well controlled.  He is scheduled for a colonoscopy next month.    Medications and allergies reviewed with patient and updated if appropriate.  Patient Active Problem List   Diagnosis Date Noted  . Herpes zoster without complication 19/50/9326  . Glaucoma 08/21/2013  . Mitral regurgitation 05/23/2013  . Ejection fraction   . Shortness of breath 05/13/2013  . Dilated aortic root ([place]) 05/13/2013  . Pectus excavatum 05/13/2013  . Numbness and tingling in hands 05/13/2013  . BPH (benign prostatic hypertrophy) 07/06/2011  . CAD (coronary artery disease)   . Hyperlipidemia 06/12/2009  . VITAMIN D DEFICIENCY 06/04/2009  . ALLERGIC RHINITIS 06/04/2009  . GERD 06/04/2009  . ANXIETY STATE, UNSPECIFIED 05/15/2009  . Osteopenia 05/15/2009  . Ulcerative colitis ([place])  05/17/2007    Current Outpatient Prescriptions on File Prior to Visit  Medication Sig Dispense Refill  . AMBULATORY NON FORMULARY MEDICATION Medication Name: IBgard take 1 capsule three times a day as needed    . aspirin 81 MG tablet Take 81 mg by mouth daily.      . clopidogrel (PLAVIX) 75 MG tablet Take 1 tablet (75 mg total) by mouth daily. 90 tablet 3  . Coenzyme Q10 (CO Q-10) 300 MG CAPS Take 1 tablet by mouth daily.     Marland Kitchen latanoprost (XALATAN) 0.005 % ophthalmic solution Place 1 drop into both eyes at bedtime.    Marland Kitchen loratadine (CLARITIN) 10 MG tablet Take 10 mg by mouth as needed.     . Multiple Vitamin (MULTIVITAMIN) tablet Take 1 tablet by mouth daily.    Marland Kitchen PENTASA 500 MG CR capsule TAKE 2 CAPSULES BY MOUTH 4 TIMES A DAY 720 capsule 1  . simvastatin (ZOCOR) 20 MG tablet Take 1 tablet (20 mg total) by mouth daily. 90 tablet 3  . timolol (TIMOPTIC) 0.5 % ophthalmic solution INSTILL 1 DROP INTO LEFT EYE EVERY MORNING  3   No current facility-administered medications on file prior to visit.     Past Medical History:  Diagnosis Date  . ALLERGIC RHINITIS   .  ANEMIA, IRON DEFICIENCY   . Anxiety state, unspecified   . Arm pain    Right arm discomfort with some radiation from the biceps down to the wrist..  January, 2014  . CAD (coronary artery disease)    [name] / nuclear 2005, excellent tolerance no scar or ischemia, EF 51% / nuclear, February, 2012, no scar or ischemia / catheterization March 26, 2010.. 10% in-stent restenosis, minimal other nonobstructive coronary disease, ejection fraction 55%., excellent result; ETT 9/13: ex 13:14, no CP, no ischemic ECG changes  . Cataract   . Ejection fraction   . Esophagitis   . GERD (gastroesophageal reflux disease)   . Glaucoma   . Hyperlipidemia   . INGUINAL HERNIA   . Osteopenia   . OSTEOPENIA   . ULCERATIVE COLITIS-UNIVERSAL   . VITAMIN D DEFICIENCY     Past Surgical History:  Procedure Laterality Date  . ABDOMINAL  HERNIA REPAIR  2006  . ANGIOPLASTY  2004   stent 2004  . COLONOSCOPY    . rhinoseptoplasty    . TONSILLECTOMY      Social History   Social History  . Marital status: Married    Spouse name: N/A  . Number of children: 1  . Years of education: N/A   Occupational History  . retired Retired   Social History Main Topics  . Smoking status: Never Smoker  . Smokeless tobacco: Never Used     Comment: Married, 1 stepson, 5 g-kids. Retired Emergency planning/management officer, now Herbalist  . Alcohol use 0.6 oz/week    1 Glasses of wine per week     Comment: weekly at most  . Drug use: No  . Sexual activity: Yes    Partners: Female   Other Topics Concern  . None   Social History Narrative   Exercising regularly: walks at least 5 miles a day, 6 days a week    Family History  Problem Relation Age of Onset  . Diabetes Mother   . Heart disease Mother   . Lung disease Mother        ILD  . Rheum arthritis Mother   . Heart disease Father 18       MI  . Asthma Sister   . Colon cancer Neg Hx   . Esophageal cancer Neg Hx   . Pancreatic cancer Neg Hx   . Kidney disease Neg Hx   . Liver disease Neg Hx     Review of Systems  Constitutional: Positive for fatigue (chronic). Negative for appetite change, chills and fever.  Gastrointestinal: Positive for abdominal pain (mild cramping at times), diarrhea and nausea (mild). Negative for blood in stool.       Objective:   Vitals:   09/27/16 1038  BP: 112/74  Pulse: 60  Resp: 16  Temp: 98.6 F (37 C)  SpO2: 98%   Filed Weights   09/27/16 1038  Weight: 159 lb (72.1 kg)   Body mass index is 24 kg/m.  Wt Readings from Last 3 Encounters:  09/27/16 159 lb (72.1 kg)  08/25/16 162 lb (73.5 kg)  06/30/16 160 lb (72.6 kg)     Physical Exam  Constitutional: He appears well-developed and well-nourished. No distress.  HENT:  Head: Normocephalic and atraumatic.  Abdominal: Soft. He exhibits no distension and no mass. There is no  tenderness. There is no rebound and no guarding.  Musculoskeletal: He exhibits no edema.  Skin: Skin is warm and dry. He is not diaphoretic.  Psychiatric: He  has a normal mood and affect. His behavior is normal.          Assessment & Plan:   See Problem List for Assessment and Plan of chronic medical problems.

## 2016-09-27 ENCOUNTER — Ambulatory Visit (INDEPENDENT_AMBULATORY_CARE_PROVIDER_SITE_OTHER): Payer: BLUE CROSS/BLUE SHIELD | Admitting: Internal Medicine

## 2016-09-27 ENCOUNTER — Encounter: Payer: Self-pay | Admitting: Internal Medicine

## 2016-09-27 VITALS — BP 112/74 | HR 60 | Temp 98.6°F | Resp 16 | Wt 159.0 lb

## 2016-09-27 DIAGNOSIS — R195 Other fecal abnormalities: Secondary | ICD-10-CM | POA: Diagnosis not present

## 2016-09-27 NOTE — Assessment & Plan Note (Signed)
Over the past week he had diarrhea initially and now constipation - possible from consuming flax seeds Started taking a probiotic and is now mildly constipated No fever, abdominal pain or blood in stool - unlikely UC Possible IBS or related to something he ate Stools have improved with probiotic - he will take daily or as needed based on stools No concerning symptoms - so no need to imaging or blood work at this time He will eat a bland diet Call if symptoms do not continue to improve

## 2016-09-27 NOTE — Patient Instructions (Addendum)
Keep doing what you are doing.  Continue a bland diet.  Use the probiotics daily or as needed depending on your bowels.     If your symptoms do not continue to improve or worsen please let me know.

## 2016-10-10 ENCOUNTER — Other Ambulatory Visit: Payer: BLUE CROSS/BLUE SHIELD

## 2016-10-13 ENCOUNTER — Encounter: Payer: Self-pay | Admitting: Gastroenterology

## 2016-10-20 ENCOUNTER — Encounter: Payer: Self-pay | Admitting: Gastroenterology

## 2016-10-20 ENCOUNTER — Ambulatory Visit (AMBULATORY_SURGERY_CENTER): Payer: BLUE CROSS/BLUE SHIELD | Admitting: Gastroenterology

## 2016-10-20 VITALS — BP 101/65 | HR 50 | Temp 97.5°F | Resp 11 | Ht 68.5 in | Wt 159.0 lb

## 2016-10-20 DIAGNOSIS — K5180 Other ulcerative colitis without complications: Secondary | ICD-10-CM | POA: Diagnosis not present

## 2016-10-20 DIAGNOSIS — Z09 Encounter for follow-up examination after completed treatment for conditions other than malignant neoplasm: Secondary | ICD-10-CM | POA: Diagnosis present

## 2016-10-20 DIAGNOSIS — K51919 Ulcerative colitis, unspecified with unspecified complications: Secondary | ICD-10-CM

## 2016-10-20 MED ORDER — SODIUM CHLORIDE 0.9 % IV SOLN: 500.0000 mL | INTRAVENOUS | Status: DC

## 2016-10-20 NOTE — Patient Instructions (Signed)
YOU HAD AN ENDOSCOPIC PROCEDURE TODAY AT [facility]:   Refer to the procedure report that was given to you for any specific questions about what was found during the examination.  If the procedure report does not answer your questions, please call your gastroenterologist to clarify.  If you requested that your care partner not be given the details of your procedure findings, then the procedure report has been included in a sealed envelope for you to review at your convenience later.  YOU SHOULD EXPECT: Some feelings of bloating in the abdomen. Passage of more gas than usual.  Walking can help get rid of the air that was put into your GI tract during the procedure and reduce the bloating. If you had a lower endoscopy (such as a colonoscopy or flexible sigmoidoscopy) you may notice spotting of blood in your stool or on the toilet paper. If you underwent a bowel prep for your procedure, you may not have a normal bowel movement for a few days.  Please Note:  You might notice some irritation and congestion in your nose or some drainage.  This is from the oxygen used during your procedure.  There is no need for concern and it should clear up in a day or so.  SYMPTOMS TO REPORT IMMEDIATELY:   Following lower endoscopy (colonoscopy or flexible sigmoidoscopy):  Excessive amounts of blood in the stool  Significant tenderness or worsening of abdominal pains  Swelling of the abdomen that is new, acute  Fever of 100F or higher   For urgent or emergent issues, a gastroenterologist can be reached at any hour by calling [phone].   DIET:  We do recommend a small meal at first, but then you may proceed to your regular diet.  Drink plenty of fluids but you should avoid alcoholic beverages for 24 hours.  ACTIVITY:  You should plan to take it easy for the rest of today and you should NOT DRIVE or use heavy machinery until tomorrow (because of the sedation medicines used during the test).     FOLLOW UP: Our staff will call the number listed on your records the next business day following your procedure to check on you and address any questions or concerns that you may have regarding the information given to you following your procedure. If we do not reach you, we will leave a message.  However, if you are feeling well and you are not experiencing any problems, there is no need to return our call.  We will assume that you have returned to your regular daily activities without incident.  If any biopsies were taken you will be contacted by phone or by letter within the next 1-3 weeks.  Please call us at [phone] if you have not heard about the biopsies in 3 weeks.    SIGNATURES/CONFIDENTIALITY: You and/or your care partner have signed paperwork which will be entered into your electronic medical record.  These signatures attest to the fact that that the information above on your After Visit Summary has been reviewed and is understood.  Full responsibility of the confidentiality of this discharge information lies with you and/or your care-partner.  You may restart your plavix tomorrow per Dr. Silverio Decamp.

## 2016-10-20 NOTE — Progress Notes (Signed)
Called to room to assist during endoscopic procedure.  Patient ID and intended procedure confirmed with present staff. Received instructions for my participation in the procedure from the performing physician.  

## 2016-10-20 NOTE — Progress Notes (Signed)
A/ox3 pleased with MAC, report to [employer]

## 2016-10-20 NOTE — Op Note (Addendum)
[facility] Patient Name: Joseph Booker Procedure Date: 10/20/2016 8:37 AM MRN: [id number] Endoscopist: Mauri Pole , MD Age: 66 Referring MD:  Date of Birth: [birth year] Gender: Male Account #: [id number] Procedure:                Colonoscopy Indications:              High risk colon cancer surveillance: Ulcerative                            pancolitis of 8 (or more) years duration Medicines:                Monitored Anesthesia Care Procedure:                Pre-Anesthesia Assessment:                           - Prior to the procedure, a History and Physical                            was performed, and patient medications and                            allergies were reviewed. The patient's tolerance of                            previous anesthesia was also reviewed. The risks                            and benefits of the procedure and the sedation                            options and risks were discussed with the patient.                            All questions were answered, and informed consent                            was obtained. Prior Anticoagulants: The patient has                            taken no previous anticoagulant or antiplatelet                            agents. ASA Grade Assessment: III - A patient with                            severe systemic disease. After reviewing the risks                            and benefits, the patient was deemed in                            satisfactory condition to undergo the procedure.  After obtaining informed consent, the colonoscope                            was passed under direct vision. Throughout the                            procedure, the patient's blood pressure, pulse, and                            oxygen saturations were monitored continuously. The                            Colonoscope was introduced through the anus and                            advanced to the the  cecum, identified by                            appendiceal orifice and ileocecal valve. The                            colonoscopy was performed without difficulty. The                            patient tolerated the procedure well. The quality                            of the bowel preparation was excellent. The                            ileocecal valve, appendiceal orifice, and rectum                            were photographed. Scope In: 8:46:59 AM Scope Out: 9:05:36 AM Scope Withdrawal Time: 0 hours 11 minutes 16 seconds  Total Procedure Duration: 0 hours 18 minutes 37 seconds  Findings:                 The perianal and digital rectal examinations were                            normal.                           Inflammation was found in a continuous and                            circumferential pattern from the rectum to the                            cecum. This was graded as Mayo Score 1 (mild, with                            erythema, decreased vascular pattern, mild  friability), and when compared to the previous                            examination, the findings are in remission.                            Biopsies were taken with a cold forceps for                            histology.                           Non-bleeding internal hemorrhoids were found during                            retroflexion. The hemorrhoids were medium-sized.                           The exam was otherwise without abnormality. Complications:            No immediate complications. Estimated Blood Loss:     Estimated blood loss was minimal. 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                            The findings are in remission since the last                            examination. Biopsied.                           - Non-bleeding internal hemorrhoids.                           - The examination was otherwise  normal. Recommendation:           - Patient has a contact number available for                            emergencies. The signs and symptoms of potential                            delayed complications were discussed with the                            patient. Return to normal activities tomorrow.                            Written discharge instructions were provided to the                            patient.                           - Resume previous diet.                           -  Continue present medications.                           - Await pathology results.                           - Repeat colonoscopy date to be determined after                            pending pathology results are reviewed for                            surveillance based on pathology results.                           - Return to GI clinic in 6 months for follow up and                            PRN for hemorrhoid band ligation                           - Resume Plavix (clopidogrel) at prior dose                            tomorrow. Refer to managing physician for further                            adjustment of therapy. Mauri Pole, MD 10/20/2016 9:11:11 AM This report has been signed electronically.

## 2016-10-21 ENCOUNTER — Telehealth: Payer: Self-pay | Admitting: *Deleted

## 2016-10-21 NOTE — Telephone Encounter (Signed)
  Follow up Call-  Call back number 10/20/2016 08/13/2015 07/31/2014  Post procedure Call Back phone  # [phone]  Permission to leave phone message Yes Yes Yes  Some recent data might be hidden     Patient questions:  Do you have a fever, pain , or abdominal swelling? No. Pain Score  0 *  Have you tolerated food without any problems? Yes.    Have you been able to return to your normal activities? Yes.    Do you have any questions about your discharge instructions: Diet   No. Medications  No. Follow up visit  No.  Do you have questions or concerns about your Care? No.  Actions: * If pain score is 4 or above: No action needed, pain <4.

## 2016-10-26 ENCOUNTER — Encounter: Payer: Self-pay | Admitting: Gastroenterology

## 2016-11-22 ENCOUNTER — Other Ambulatory Visit: Payer: Self-pay | Admitting: Gastroenterology

## 2016-12-21 ENCOUNTER — Other Ambulatory Visit: Payer: BLUE CROSS/BLUE SHIELD | Admitting: *Deleted

## 2016-12-21 DIAGNOSIS — E7800 Pure hypercholesterolemia, unspecified: Secondary | ICD-10-CM

## 2016-12-21 DIAGNOSIS — Z79899 Other long term (current) drug therapy: Secondary | ICD-10-CM

## 2016-12-21 LAB — HEPATIC FUNCTION PANEL
ALK PHOS: 57 IU/L (ref 39–117)
ALT: 23 IU/L (ref 0–44)
AST: 30 IU/L (ref 0–40)
Albumin: 4.5 g/dL (ref 3.6–4.8)
BILIRUBIN TOTAL: 0.5 mg/dL (ref 0.0–1.2)
BILIRUBIN, DIRECT: 0.12 mg/dL (ref 0.00–0.40)
TOTAL PROTEIN: 6.8 g/dL (ref 6.0–8.5)

## 2016-12-21 LAB — LIPID PANEL
Chol/HDL Ratio: 3.5 ratio (ref 0.0–5.0)
Cholesterol, Total: 168 mg/dL (ref 100–199)
HDL: 48 mg/dL (ref >39)
LDL CALC: 101 mg/dL — AB (ref 0–99)
Triglycerides: 96 mg/dL (ref 0–149)
VLDL CHOLESTEROL CAL: 19 mg/dL (ref 5–40)

## 2016-12-23 ENCOUNTER — Encounter: Payer: Self-pay | Admitting: Cardiovascular Disease

## 2016-12-28 ENCOUNTER — Telehealth: Payer: Self-pay

## 2016-12-28 DIAGNOSIS — I2510 Atherosclerotic heart disease of native coronary artery without angina pectoris: Secondary | ICD-10-CM

## 2016-12-28 NOTE — Telephone Encounter (Signed)
Ok to increase zocor to 20 mg and f/u labs 3 months until LDL 70

## 2016-12-28 NOTE — Telephone Encounter (Signed)
Patient is concerned about his LDL being 101. Will forward to [name].

## 2016-12-29 ENCOUNTER — Encounter (HOSPITAL_COMMUNITY): Payer: Self-pay

## 2016-12-29 ENCOUNTER — Ambulatory Visit (HOSPITAL_COMMUNITY)
Admission: RE | Admit: 2016-12-29 | Discharge: 2016-12-29 | Disposition: A | Discharge status: Home / Self Care | Payer: BLUE CROSS/BLUE SHIELD | Source: Ambulatory Visit | Attending: Cardiovascular Disease | Admitting: Cardiovascular Disease

## 2016-12-29 DIAGNOSIS — I7121 Aneurysm of the ascending aorta, without rupture: Secondary | ICD-10-CM

## 2016-12-29 DIAGNOSIS — I719 Aortic aneurysm of unspecified site, without rupture: Secondary | ICD-10-CM

## 2016-12-29 DIAGNOSIS — I712 Thoracic aortic aneurysm, without rupture: Secondary | ICD-10-CM | POA: Diagnosis not present

## 2016-12-29 LAB — POCT I-STAT CREATININE: CREATININE: 1.2 mg/dL (ref 0.61–1.24)

## 2016-12-29 MED ORDER — NITROGLYCERIN 0.4 MG SL SUBL
0.8000 mg | SUBLINGUAL_TABLET | Freq: Once | SUBLINGUAL | Status: AC
  Administered 2016-12-29: 0.8 mg via SUBLINGUAL

## 2016-12-29 MED ORDER — NITROGLYCERIN 0.4 MG SL SUBL
SUBLINGUAL_TABLET | SUBLINGUAL | Status: AC
  Filled 2016-12-29: qty 2

## 2016-12-29 MED ORDER — IOPAMIDOL (ISOVUE-370) INJECTION 76%
INTRAVENOUS | Status: AC
  Administered 2016-12-29: 100 mL
  Filled 2016-12-29: qty 100

## 2016-12-29 NOTE — Telephone Encounter (Signed)
Patient stated he would discuss his cholesterol at his next office visit.

## 2016-12-29 NOTE — Progress Notes (Signed)
Patient tolerated CT without incident. Given patient a ginger ale and did not want crackers.  Tolerated oral intake. Ambulatory steady gait.

## 2017-01-19 ENCOUNTER — Telehealth: Payer: Self-pay | Admitting: Gastroenterology

## 2017-01-19 NOTE — Telephone Encounter (Signed)
Left a message to call back. Okay to schedule if he is interested in an appointment for the hemorrhoid banding.

## 2017-01-19 NOTE — Telephone Encounter (Signed)
Scheduled the patient for his first hem band on 02/16/17.  He asks about options other than Pentasa. He will have to go on Medicare Part D in a few months. The Pentasa will cost him about $7000 a year. Does he have other medication options.

## 2017-01-23 NOTE — Telephone Encounter (Signed)
Please check if Apriso/Lialda or Delzicol has better coverage or whats on formulary for Medicare part D. Thanks

## 2017-01-26 NOTE — Telephone Encounter (Signed)
Information emailed to the patient. 

## 2017-01-31 ENCOUNTER — Encounter: Payer: Self-pay | Admitting: Cardiovascular Disease

## 2017-02-01 ENCOUNTER — Ambulatory Visit: Payer: BLUE CROSS/BLUE SHIELD | Admitting: Internal Medicine

## 2017-02-01 ENCOUNTER — Encounter: Payer: Self-pay | Admitting: Internal Medicine

## 2017-02-01 DIAGNOSIS — R739 Hyperglycemia, unspecified: Secondary | ICD-10-CM

## 2017-02-01 LAB — POCT GLYCOSYLATED HEMOGLOBIN (HGB A1C): Hemoglobin A1C: 5.2

## 2017-02-01 NOTE — Progress Notes (Signed)
Subjective:    Patient ID: Joseph Booker, male    DOB: 26-Apr-1950, 67 y.o.   MRN: [id number]  HPI The patient is here for concerns regarding his blood sugar.  He has extreme thirst, hungry all the time, feels antsy, trouble sleeping, occasional shooting pain in feet.  Yesterday his sugar was 106 after eating 4 hrs ago.  His sugar this morning was 115 fasting.   He exercises regularly - walks up to 6 miles a day 6 days a week, but not all at once.  He does not eat candy, he does not drinks soda.  He does like carbs.   His mother had diabetes.   Medications and allergies reviewed with patient and updated if appropriate.  Patient Active Problem List   Diagnosis Date Noted  . Change in stool 09/27/2016  . Herpes zoster without complication 98/33/8250  . Glaucoma 08/21/2013  . Mitral regurgitation 05/23/2013  . Ejection fraction   . Shortness of breath 05/13/2013  . Dilated aortic root ([place]) 05/13/2013  . Pectus excavatum 05/13/2013  . Numbness and tingling in hands 05/13/2013  . BPH (benign prostatic hypertrophy) 07/06/2011  . CAD (coronary artery disease)   . Hyperlipidemia 06/12/2009  . VITAMIN D DEFICIENCY 06/04/2009  . ALLERGIC RHINITIS 06/04/2009  . GERD 06/04/2009  . ANXIETY STATE, UNSPECIFIED 05/15/2009  . Osteopenia 05/15/2009  . Ulcerative colitis ([place]) 05/17/2007    Current Outpatient Medications on File Prior to Visit  Medication Sig Dispense Refill  . aspirin 81 MG tablet Take 81 mg by mouth daily.      . clopidogrel (PLAVIX) 75 MG tablet Take 1 tablet (75 mg total) by mouth daily. 90 tablet 3  . Coenzyme Q10 (CO Q-10) 300 MG CAPS Take 1 tablet by mouth daily.     Marland Kitchen latanoprost (XALATAN) 0.005 % ophthalmic solution Place 1 drop into both eyes at bedtime.    Marland Kitchen loratadine (CLARITIN) 10 MG tablet Take 10 mg by mouth as needed.     . Multiple Vitamin (MULTIVITAMIN) tablet Take 1 tablet by mouth daily.    Marland Kitchen PENTASA 500 MG CR capsule TAKE 2 CAPSULES BY MOUTH 4 TIMES A  DAY 720 capsule 1  . simvastatin (ZOCOR) 20 MG tablet Take 1 tablet (20 mg total) by mouth daily. 90 tablet 3  . timolol (TIMOPTIC) 0.5 % ophthalmic solution INSTILL 1 DROP INTO LEFT EYE EVERY MORNING  3   No current facility-administered medications on file prior to visit.     Past Medical History:  Diagnosis Date  . ALLERGIC RHINITIS   . ANEMIA, IRON DEFICIENCY   . Anxiety state, unspecified   . Arm pain    Right arm discomfort with some radiation from the biceps down to the wrist..  January, 2014  . CAD (coronary artery disease)    [name] / nuclear 2005, excellent tolerance no scar or ischemia, EF 51% / nuclear, February, 2012, no scar or ischemia / catheterization March 26, 2010.. 10% in-stent restenosis, minimal other nonobstructive coronary disease, ejection fraction 55%., excellent result; ETT 9/13: ex 13:14, no CP, no ischemic ECG changes  . Cataract   . Ejection fraction   . Esophagitis   . GERD (gastroesophageal reflux disease)   . Glaucoma   . Hyperlipidemia   . INGUINAL HERNIA   . Osteopenia   . OSTEOPENIA   . Pneumonia   . ULCERATIVE COLITIS-UNIVERSAL   . VITAMIN D DEFICIENCY     Past Surgical History:  Procedure Laterality Date  .  ABDOMINAL HERNIA REPAIR  2006  . ANGIOPLASTY  2004   stent 2004  . COLONOSCOPY    . CORONARY ANGIOPLASTY WITH STENT PLACEMENT    . rhinoseptoplasty    . TONSILLECTOMY      Social History   Socioeconomic History  . Marital status: Married    Spouse name: None  . Number of children: 1  . Years of education: None  . Highest education level: None  Social Needs  . Financial resource strain: None  . Food insecurity - worry: None  . Food insecurity - inability: None  . Transportation needs - medical: None  . Transportation needs - non-medical: None  Occupational History  . Occupation: retired    Fish farm manager: RETIRED  Tobacco Use  . Smoking status: Never Smoker  . Smokeless tobacco: Never Used  . Tobacco comment:  Married, 1 stepson, 5 g-kids. Retired Emergency planning/management officer, now Forensic scientist and Sexual Activity  . Alcohol use: Yes    Alcohol/week: 0.6 oz    Types: 1 Glasses of wine per week    Comment: weekly at most  . Drug use: No  . Sexual activity: Yes    Partners: Female  Other Topics Concern  . None  Social History Narrative   Exercising regularly: walks at least 5 miles a day, 6 days a week    Family History  Problem Relation Age of Onset  . Diabetes Mother   . Heart disease Mother   . Lung disease Mother        ILD  . Rheum arthritis Mother   . Heart disease Father 85       MI  . Asthma Sister   . Colon cancer Neg Hx   . Esophageal cancer Neg Hx   . Pancreatic cancer Neg Hx   . Kidney disease Neg Hx   . Liver disease Neg Hx     Review of Systems  Constitutional: Negative for fever.       Feels antsy  Respiratory: Positive for shortness of breath.   Cardiovascular: Positive for palpitations. Negative for chest pain.  Endocrine: Positive for polydipsia, polyphagia and polyuria.  Neurological: Positive for dizziness and light-headedness.  Psychiatric/Behavioral: Positive for sleep disturbance.       Objective:   Vitals:   02/01/17 1418  BP: 122/84  Pulse: 67  Resp: 16  Temp: 97.7 F (36.5 C)  SpO2: 97%   Wt Readings from Last 3 Encounters:  02/01/17 161 lb (73 kg)  10/20/16 159 lb (72.1 kg)  09/27/16 159 lb (72.1 kg)   Body mass index is 24.12 kg/m.   Physical Exam    Constitutional: Appears well-developed and well-nourished. No distress.  HENT:  Head: Normocephalic and atraumatic.  Neck: Neck supple. No tracheal deviation present. No thyromegaly present.  No cervical lymphadenopathy Cardiovascular: Normal rate, regular rhythm and normal heart sounds.   No murmur heard.   No edema Pulmonary/Chest: Effort normal and breath sounds normal. No respiratory distress. No has no wheezes. No rales.  Skin: Skin is warm and dry. Not diaphoretic.    Psychiatric: Normal mood and affect. Behavior is normal.      Assessment & Plan:    See Problem List for Assessment and Plan of chronic medical problems.

## 2017-02-01 NOTE — Patient Instructions (Addendum)
Your a1c was checked today.   Decrease your carbohydrate intake.  Continue your regular exercise.  Work on losing 5 lbs.     Call with any questions.   Schedule your physical exam for March.     Prediabetes Prediabetes is the condition of having a blood sugar (blood glucose) level that is higher than it should be, but not high enough for you to be diagnosed with type 2 diabetes. Having prediabetes puts you at risk for developing type 2 diabetes (type 2 diabetes mellitus). Prediabetes may be called impaired glucose tolerance or impaired fasting glucose. Prediabetes usually does not cause symptoms. Your health care provider can diagnose this condition with blood tests. You may be tested for prediabetes if you are overweight and if you have at least one other risk factor for prediabetes. Risk factors for prediabetes include:  Having a family member with type 2 diabetes.  Being overweight or obese.  Being older than age 20.  Being of American-Indian, African-American, Hispanic/Latino, or Asian/Pacific Islander descent.  Having an inactive (sedentary) lifestyle.  Having a history of gestational diabetes or polycystic ovarian syndrome (PCOS).  Having low levels of good cholesterol (HDL-C) or high levels of blood fats (triglycerides).  Having high blood pressure.  What is blood glucose and how is blood glucose measured?  Blood glucose refers to the amount of glucose in your bloodstream. Glucose comes from eating foods that contain sugars and starches (carbohydrates) that the body breaks down into glucose. Your blood glucose level may be measured in mg/dL (milligrams per deciliter) or mmol/L (millimoles per liter).Your blood glucose may be checked with one or more of the following blood tests:  A fasting blood glucose (FBG) test. You will not be allowed to eat (you will fast) for at least 8 hours before a blood sample is taken. ? A normal range for FBG is 70-100 mg/dl (3.9-5.6  mmol/L).  An A1c (hemoglobin A1c) blood test. This test provides information about blood glucose control over the previous 2?62months.  An oral glucose tolerance test (OGTT). This test measures your blood glucose twice: ? After fasting. This is your baseline level. ? Two hours after you drink a beverage that contains glucose.  You may be diagnosed with prediabetes:  If your FBG is 100?125 mg/dL (5.6-6.9 mmol/L).  If your A1c level is 5.7?6.4%.  If your OGGT result is 140?199 mg/dL (7.8-11 mmol/L).  These blood tests may be repeated to confirm your diagnosis. What happens if blood glucose is too high? The pancreas produces a hormone (insulin) that helps move glucose from the bloodstream into cells. When cells in the body do not respond properly to insulin that the body makes (insulin resistance), excess glucose builds up in the blood instead of going into cells. As a result, high blood glucose (hyperglycemia) can develop, which can cause many complications. This is a symptom of prediabetes. What can happen if blood glucose stays higher than normal for a long time? Having high blood glucose for a long time is dangerous. Too much glucose in your blood can damage your nerves and blood vessels. Long-term damage can lead to complications from diabetes, which may include:  Heart disease.  Stroke.  Blindness.  Kidney disease.  Depression.  Poor circulation in the feet and legs, which could lead to surgical removal (amputation) in severe cases.  How can prediabetes be prevented from turning into type 2 diabetes?  To help prevent type 2 diabetes, take the following actions:  Be physically active. ? Do  moderate-intensity physical activity for at least 30 minutes on at least 5 days of the week, or as much as told by your health care provider. This could be brisk walking, biking, or water aerobics. ? Ask your health care provider what activities are safe for you. A mix of physical  activities may be best, such as walking, swimming, cycling, and strength training.  Lose weight as told by your health care provider. ? Losing 5-7% of your body weight can reverse insulin resistance. ? Your health care provider can determine how much weight loss is best for you and can help you lose weight safely.  Follow a healthy meal plan. This includes eating lean proteins, complex carbohydrates, fresh fruits and vegetables, low-fat dairy products, and healthy fats. ? Follow instructions from your health care provider about eating or drinking restrictions. ? Make an appointment to see a diet and nutrition specialist (registered dietitian) to help you create a healthy eating plan that is right for you.  Do not smoke or use any tobacco products, such as cigarettes, chewing tobacco, and e-cigarettes. If you need help quitting, ask your health care provider.  Take over-the-counter and prescription medicines as told by your health care provider. You may be prescribed medicines that help lower the risk of type 2 diabetes.  This information is not intended to replace advice given to you by your health care provider. Make sure you discuss any questions you have with your health care provider. Document Released: 04/27/2015 Document Revised: 06/11/2015 Document Reviewed: 02/24/2015 Elsevier Interactive Patient Education  [employer].

## 2017-02-01 NOTE — Assessment & Plan Note (Signed)
Checking sugars at home - in prediabetic range Will check a1c Mother had diabetes so he is at an increase risk Continue regular exercise Work on losing 5 lbs Decrease carb intake

## 2017-02-13 NOTE — Progress Notes (Signed)
Patient ID: Joseph Booker, male   DOB: [birth year], 67 y.o.   MRN: [id number]    Cardiology Office Note   Date:  02/15/2017   ID:  Joseph Booker, DOB Jan 29, 1950, MRN [id number]  PCP:  Binnie Rail, MD  Cardiologist:  Jenkins Rouge, MD   No chief complaint on file.     History of Present Illness: Joseph Booker is a 67 y.o. male who presents today to follow-up coronary disease and shortness of breath. He is stable. New to me previously followed by Dr Ron Parker  He continues to have a sensation of shortness of breath when he takes a deep breath. He is able to do his exercise. The etiology of his shortness of breath has never been clear. The patient has had an extensive workup over time. It has been my opinion that his shortness of breath is not related to his cardiopulmonary status. He has undergone a cardiopulmonary exercise test showing no significant abnormality.  CAD:  Stent to LAD 2004  Last non ischemic myovue 2013   Echo 05/14/13 EF 50-55% mild MR  CT 12/29/16 with ascending thoracic aorta of 4.5 cm   Had some exertional chest pain June 2018  Myovue done 06/30/16 was normal with EF 55% no ischemia  Walks 6 miles 6x/week   Retired on disability as a Insurance underwriter.  Married with one sone who has 5 children   Had concerns for DM but A1c only 5.2   Past Medical History:  Diagnosis Date  . ALLERGIC RHINITIS   . ANEMIA, IRON DEFICIENCY   . Anxiety state, unspecified   . Arm pain    Right arm discomfort with some radiation from the biceps down to the wrist..  January, 2014  . CAD (coronary artery disease)    [name] / nuclear 2005, excellent tolerance no scar or ischemia, EF 51% / nuclear, February, 2012, no scar or ischemia / catheterization March 26, 2010.. 10% in-stent restenosis, minimal other nonobstructive coronary disease, ejection fraction 55%., excellent result; ETT 9/13: ex 13:14, no CP, no ischemic ECG changes  . Cataract   . Ejection fraction   . Esophagitis   . GERD  (gastroesophageal reflux disease)   . Glaucoma   . Hyperlipidemia   . INGUINAL HERNIA   . Osteopenia   . OSTEOPENIA   . Pneumonia   . ULCERATIVE COLITIS-UNIVERSAL   . VITAMIN D DEFICIENCY     Past Surgical History:  Procedure Laterality Date  . ABDOMINAL HERNIA REPAIR  2006  . ANGIOPLASTY  2004   stent 2004  . COLONOSCOPY    . CORONARY ANGIOPLASTY WITH STENT PLACEMENT    . rhinoseptoplasty    . TONSILLECTOMY      Patient Active Problem List   Diagnosis Date Noted  . Hyperglycemia 02/01/2017  . Change in stool 09/27/2016  . Herpes zoster without complication 60/73/7106  . Glaucoma 08/21/2013  . Mitral regurgitation 05/23/2013  . Ejection fraction   . Shortness of breath 05/13/2013  . Dilated aortic root ([place]) 05/13/2013  . Pectus excavatum 05/13/2013  . Numbness and tingling in hands 05/13/2013  . BPH (benign prostatic hypertrophy) 07/06/2011  . CAD (coronary artery disease)   . Hyperlipidemia 06/12/2009  . VITAMIN D DEFICIENCY 06/04/2009  . ALLERGIC RHINITIS 06/04/2009  . GERD 06/04/2009  . ANXIETY STATE, UNSPECIFIED 05/15/2009  . Osteopenia 05/15/2009  . Ulcerative colitis ([place]) 05/17/2007      Current Outpatient Medications  Medication Sig Dispense Refill  . aspirin 81 MG tablet Take 81  mg by mouth daily.      . clopidogrel (PLAVIX) 75 MG tablet Take 1 tablet (75 mg total) by mouth daily. 90 tablet 3  . Coenzyme Q10 (CO Q-10) 300 MG CAPS Take 1 tablet by mouth daily.     Marland Kitchen latanoprost (XALATAN) 0.005 % ophthalmic solution Place 1 drop into both eyes at bedtime.    Marland Kitchen loratadine (CLARITIN) 10 MG tablet Take 10 mg by mouth as needed.     . Multiple Vitamin (MULTIVITAMIN) tablet Take 1 tablet by mouth daily.    Marland Kitchen PENTASA 500 MG CR capsule TAKE 2 CAPSULES BY MOUTH 4 TIMES A DAY 720 capsule 1  . simvastatin (ZOCOR) 20 MG tablet Take 1 tablet (20 mg total) by mouth daily. 90 tablet 3  . timolol (TIMOPTIC) 0.5 % ophthalmic solution INSTILL 1 DROP INTO LEFT EYE EVERY  MORNING  3   No current facility-administered medications for this visit.     Allergies:   Dust mite extract    Social History:  The patient  reports that  has never smoked. he has never used smokeless tobacco. He reports that he drinks about 0.6 oz of alcohol per week. He reports that he does not use drugs.   Family History:  The patient's family history includes Asthma in his sister; Diabetes in his mother; Heart disease in his mother; Heart disease (age of onset: 3) in his father; Lung disease in his mother; Rheum arthritis in his mother.    ROS:  Please see the history of present illness.   Patient denies fever, chills, headache, sweats, rash, change in vision, change in hearing, chest pain, cough, nausea or vomiting, urinary symptoms. All other systems are reviewed and are negative other than the history of present illness.     PHYSICAL EXAM: VS:  BP 120/74   Pulse (!) 51   Ht 5' 8.5" (1.74 m)   Wt 155 lb 4 oz (70.4 kg)   SpO2 97%   BMI 23.26 kg/m  , Affect appropriate Healthy:  appears stated age HEENT: normal Neck supple with no adenopathy JVP normal no bruits no thyromegaly Lungs clear with no wheezing and good diaphragmatic motion Heart:  S1/S2 no murmur, no rub, gallop or click PMI normal Abdomen: benighn, BS positve, no tenderness, no AAA no bruit.  No HSM or HJR Distal pulses intact with no bruits No edema Neuro non-focal Skin warm and dry No muscular weakness    EKG:    08/22/14  SR ate 53  LAD normal ST segments   03/18/15  NSR rate 61  LAD otherwise normal  05/11/16  SR LAD otherwise normal   Recent Labs: 03/25/2016: BUN 22; Hemoglobin 15.7; Platelets 183.0; Potassium 3.8; Sodium 141; TSH 0.99 12/21/2016: ALT 23 12/29/2016: Creatinine, Ser 1.20    Lipid Panel    Component Value Date/Time   CHOL 168 12/21/2016 1024   TRIG 96 12/21/2016 1024   TRIG 46 12/19/2008   HDL 48 12/21/2016 1024   CHOLHDL 3.5 12/21/2016 1024   CHOLHDL 4 03/25/2016 0914   VLDL  18.2 03/25/2016 0914   LDLCALC 101 (H) 12/21/2016 1024      Wt Readings from Last 3 Encounters:  02/15/17 155 lb 4 oz (70.4 kg)  02/01/17 161 lb (73 kg)  10/20/16 159 lb (72.1 kg)      Current medicines are reviewed  The patient understands his medications.     ASSESSMENT AND PLAN:  Dyspnea:  Normal cardiopulm stress test 2015 EF normal  by echo 06/30/16 55-60% observe  CAD:  Stent to LAD 2004 nonischemic myovue June 2018 continue medical Rx  Cholestero: on zocor 10 mg suggested increasing to 20 mg for target LDL 70 or less Lab Results  Component Value Date   LDLCALC 101 (H) 12/21/2016  Neuropathy:  Hands and feet f/u primary  Aorta: will do f/u CT/or MRI/MRA imaging in a year BP well controlled on statin           Jenkins Rouge

## 2017-02-15 ENCOUNTER — Encounter: Payer: Self-pay | Admitting: Cardiovascular Disease

## 2017-02-15 ENCOUNTER — Ambulatory Visit: Payer: BLUE CROSS/BLUE SHIELD | Admitting: Cardiovascular Disease

## 2017-02-15 VITALS — BP 120/74 | HR 51 | Ht 68.5 in | Wt 155.2 lb

## 2017-02-15 DIAGNOSIS — I712 Thoracic aortic aneurysm, without rupture, unspecified: Secondary | ICD-10-CM

## 2017-02-15 NOTE — Patient Instructions (Signed)

## 2017-02-16 ENCOUNTER — Telehealth: Payer: Self-pay | Admitting: *Deleted

## 2017-02-16 ENCOUNTER — Other Ambulatory Visit: Payer: Self-pay | Admitting: *Deleted

## 2017-02-16 ENCOUNTER — Encounter: Payer: BLUE CROSS/BLUE SHIELD | Admitting: Gastroenterology

## 2017-02-16 MED ORDER — MESALAMINE ER 0.375 G PO CP24: ORAL_CAPSULE | ORAL | 6 refills | Status: DC

## 2017-02-16 NOTE — Telephone Encounter (Signed)
  02/16/2017   RE: Joseph Booker DOB: 11/22/1950 MRN: [id number]   Dear  [name],     We have scheduled the above patient for an endoscopic procedure. Our records show that he is on anticoagulation therapy.   Please advise as to how long the patient may come off his therapy of Plavix prior to the procedure, which is scheduled for 03/08/2017.  Please fax back/ or route the completed form to [facility] at [phone].   Sincerely,    Tonita Phoenix

## 2017-02-17 NOTE — Telephone Encounter (Signed)
   Primary Cardiologist: Jenkins Rouge, MD  Chart reviewed as part of pre-operative protocol coverage.   You have seen this patient on 02/15/17. Please advise.   [place], [place] 02/17/2017, 1:59 PM

## 2017-02-19 NOTE — Telephone Encounter (Signed)
Ok to hold plavix for 5 days

## 2017-02-20 NOTE — Telephone Encounter (Signed)
Patient aware ok to hold 5 days before banding

## 2017-02-20 NOTE — Telephone Encounter (Signed)
   Chart reviewed as part of pre-operative protocol coverage. Pre-op clearance already addressed by colleagues below. Will route note to requesting party Genella Mech and remove from pre-op box.  Charlie Pitter, PA-C 02/20/2017, 9:03 AM

## 2017-03-08 ENCOUNTER — Ambulatory Visit: Payer: BLUE CROSS/BLUE SHIELD | Admitting: Gastroenterology

## 2017-03-08 ENCOUNTER — Encounter: Payer: Self-pay | Admitting: Gastroenterology

## 2017-03-08 VITALS — BP 102/64 | HR 52 | Ht 68.25 in | Wt 151.0 lb

## 2017-03-08 DIAGNOSIS — K641 Second degree hemorrhoids: Secondary | ICD-10-CM | POA: Diagnosis not present

## 2017-03-08 DIAGNOSIS — K625 Hemorrhage of anus and rectum: Secondary | ICD-10-CM

## 2017-03-08 NOTE — Progress Notes (Signed)
PROCEDURE NOTE: The patient presents with symptomatic grade II hemorrhoids, requesting rubber band ligation of his/her hemorrhoidal disease.  All risks, benefits and alternative forms of therapy were described and informed consent was obtained.  In the Left Lateral Decubitus position anoscopic examination revealed grade II hemorrhoids in the R posterior and Left lateral  position(s).  The anorectum was pre-medicated with 0.125% nitroglycerine and recticare The decision was made to band the Right posterior internal hemorrhoid, and the [place] was used to perform band ligation without complication.  Digital anorectal examination was then performed to assure proper positioning of the band, and to adjust the banded tissue as required.  The patient was discharged home without pain or other issues.  Dietary and behavioral recommendations were given and along with follow-up instructions.     The following adjunctive treatments were recommended:  Restart Plavix in 2 days Avoid vigorous exercises for 3-4 days  The patient will return in 4-6 weeks for  follow-up and possible additional banding as required. No complications were encountered and the patient tolerated the procedure well.  Damaris Hippo , MD [phone] Mon-Fri 8a-5p [phone] after 5p, weekends, holidays

## 2017-03-08 NOTE — Patient Instructions (Addendum)
Start Plavix back in 2 days]  Refrain from vigorous exercises for 3-4 days     Kegel Exercises  Kegel exercises help strengthen the muscles that support the rectum, vagina, small intestine, bladder, and uterus. Doing Kegel exercises can help:  Improve bladder and bowel control.  Improve sexual response.  Reduce problems and discomfort during pregnancy.  Kegel exercises involve squeezing your pelvic floor muscles, which are the same muscles you squeeze when you try to stop the flow of urine. The exercises can be done while sitting, standing, or lying down, but it is best to vary your position. Phase 1 exercises 1. Squeeze your pelvic floor muscles tight. You should feel a tight lift in your rectal area. If you are a male, you should also feel a tightness in your vaginal area. Keep your stomach, buttocks, and legs relaxed. 2. Hold the muscles tight for up to 10 seconds. 3. Relax your muscles. Repeat this exercise 50 times a day or as many times as told by your health care provider. Continue to do this exercise for at least 4-6 weeks or for as long as told by your health care provider. This information is not intended to replace advice given to you by your health care provider. Make sure you discuss any questions you have with your health care provider. Document Released: 12/21/2011 Document Revised: 08/29/2015 Document Reviewed: 11/23/2014 Elsevier Interactive Patient Education  2018 [place]   1. The procedure you have had should have been relatively painless since the banding of the area involved does not have nerve endings and there is no pain sensation.  The rubber band cuts off the blood supply to the hemorrhoid and the band may fall off as soon as 48 hours after the banding (the band may occasionally be seen in the toilet bowl following a bowel movement). You may notice a temporary feeling of fullness in the rectum which  should respond adequately to plain Tylenol or Motrin.  2. Following the banding, avoid strenuous exercise that evening and resume full activity the next day.  A sitz bath (soaking in a warm tub) or bidet is soothing, and can be useful for cleansing the area after bowel movements.     3. To avoid constipation, take two tablespoons of natural wheat bran, natural oat bran, flax, Benefiber or any over the counter fiber supplement and increase your water intake to 7-8 glasses daily.    4. Unless you have been prescribed anorectal medication, do not put anything inside your rectum for two weeks: No suppositories, enemas, fingers, etc.  5. Occasionally, you may have more bleeding than usual after the banding procedure.  This is often from the untreated hemorrhoids rather than the treated one.  Don't be concerned if there is a tablespoon or so of blood.  If there is more blood than this, lie flat with your bottom higher than your head and apply an ice pack to the area. If the bleeding does not stop within a half an hour or if you feel faint, call our office at [phone] or go to the emergency room.  6. Problems are not common; however, if there is a substantial amount of bleeding, severe pain, chills, fever or difficulty passing urine (very rare) or other problems, you should call us at [phone] or report to the nearest emergency room.  7. Do not stay seated continuously for more than 2-3 hours for  a day or two after the procedure.  Tighten your buttock muscles 10-15 times every two hours and take 10-15 deep breaths every 1-2 hours.  Do not spend more than a few minutes on the toilet if you cannot empty your bowel; instead re-visit the toilet at a later time.  Continue taking soluble Fiber

## 2017-03-30 ENCOUNTER — Encounter: Payer: BLUE CROSS/BLUE SHIELD | Admitting: Internal Medicine

## 2017-04-13 ENCOUNTER — Encounter: Payer: Self-pay | Admitting: Internal Medicine

## 2017-04-13 ENCOUNTER — Ambulatory Visit (INDEPENDENT_AMBULATORY_CARE_PROVIDER_SITE_OTHER): Payer: BLUE CROSS/BLUE SHIELD | Admitting: Internal Medicine

## 2017-04-13 DIAGNOSIS — R05 Cough: Secondary | ICD-10-CM | POA: Diagnosis not present

## 2017-04-13 DIAGNOSIS — R059 Cough, unspecified: Secondary | ICD-10-CM

## 2017-04-13 MED ORDER — LEVOFLOXACIN 500 MG PO TABS: 500.0000 mg | ORAL_TABLET | Freq: Every day | ORAL | 0 refills | Status: DC

## 2017-04-13 NOTE — Progress Notes (Signed)
   Subjective:    Patient ID: Joseph Booker, male    DOB: 04-28-1950, 67 y.o.   MRN: [id number]  HPI The patient is a 67 YO man coming in for cough and sore throat for about 6-7 days. Overall worsening. Denies SOB. Some cough with white to yellow congestion. Some nose drainage, sore throat as well. Has tried allergy medicine. Also tried nyquil which did not work. Denies sick contacts. Denies fevers or chills. Denies headaches.   Review of Systems  Constitutional: Positive for activity change, appetite change and chills. Negative for fatigue, fever and unexpected weight change.  HENT: Positive for congestion, postnasal drip, rhinorrhea and sinus pressure. Negative for ear discharge, ear pain, sinus pain, sneezing, sore throat, tinnitus, trouble swallowing and voice change.   Eyes: Negative.   Respiratory: Positive for cough. Negative for chest tightness, shortness of breath and wheezing.   Cardiovascular: Negative.   Gastrointestinal: Negative.   Musculoskeletal: Positive for myalgias.  Neurological: Negative.       Objective:   Physical Exam  Constitutional: He is oriented to person, place, and time. He appears well-developed and well-nourished.  HENT:  Head: Normocephalic and atraumatic.  Oropharynx with redness and clear drainage, nose with swollen turbinates, TMs normal bilaterally  Eyes: EOM are normal.  Neck: Normal range of motion. No thyromegaly present.  Cardiovascular: Normal rate and regular rhythm.  Pulmonary/Chest: Effort normal. No respiratory distress. He has no wheezes. He has no rales.  Some crackles in the right lower lung  Abdominal: Soft.  Musculoskeletal: He exhibits tenderness.  Lymphadenopathy:    He has no cervical adenopathy.  Neurological: He is alert and oriented to person, place, and time.  Skin: Skin is warm and dry.   Vitals:   04/13/17 1611  BP: 104/70  Pulse: 71  Temp: 98.6 F (37 C)  TempSrc: Oral  SpO2: 94%  Weight: 150 lb (68 kg)  Height: 5'  8.25" (1.734 m)      Assessment & Plan:

## 2017-04-13 NOTE — Patient Instructions (Signed)
We have sent in the levaquin to take 1 pill daily for 7 days.

## 2017-04-14 DIAGNOSIS — R059 Cough, unspecified: Secondary | ICD-10-CM | POA: Insufficient documentation

## 2017-04-14 DIAGNOSIS — R05 Cough: Secondary | ICD-10-CM | POA: Insufficient documentation

## 2017-04-14 NOTE — Assessment & Plan Note (Signed)
Given lung findings and time course will rx antibiotics. He states doxycycline for acne multiple times this year so will do levaquin. Bad reaction with azithromycin in the past.

## 2017-04-25 ENCOUNTER — Encounter: Payer: BLUE CROSS/BLUE SHIELD | Admitting: Gastroenterology

## 2017-04-29 ENCOUNTER — Encounter: Payer: Self-pay | Admitting: Cardiovascular Disease

## 2017-05-03 ENCOUNTER — Encounter: Payer: BLUE CROSS/BLUE SHIELD | Admitting: Gastroenterology

## 2017-05-30 NOTE — Progress Notes (Signed)
Subjective:    Patient ID: Joseph Booker, male    DOB: [birth year], 67 y.o.   MRN: [id number]  HPI The patient is here for an acute visit.  He had a left inguinal hernia repair in 2006.  7-10 days ago he noticed a bulge in the right groin area that got worse with coughing.  He has some discomfort.  He denies any constant pain.  He denies any skin changes.  He denies any known cause such as heavy lifting.     Medications and allergies reviewed with patient and updated if appropriate.  Patient Active Problem List   Diagnosis Date Noted  . Right inguinal hernia 05/31/2017  . Cough 04/14/2017  . Hyperglycemia 02/01/2017  . Change in stool 09/27/2016  . Herpes zoster without complication 63/14/9702  . Glaucoma 08/21/2013  . Mitral regurgitation 05/23/2013  . Ejection fraction   . Shortness of breath 05/13/2013  . Dilated aortic root ([place]) 05/13/2013  . Pectus excavatum 05/13/2013  . Numbness and tingling in hands 05/13/2013  . BPH (benign prostatic hypertrophy) 07/06/2011  . CAD (coronary artery disease)   . Hyperlipidemia 06/12/2009  . VITAMIN D DEFICIENCY 06/04/2009  . ALLERGIC RHINITIS 06/04/2009  . GERD 06/04/2009  . ANXIETY STATE, UNSPECIFIED 05/15/2009  . Osteopenia 05/15/2009  . Ulcerative colitis ([place]) 05/17/2007    Current Outpatient Medications on File Prior to Visit  Medication Sig Dispense Refill  . aspirin 81 MG tablet Take 81 mg by mouth daily.      . clopidogrel (PLAVIX) 75 MG tablet Take 1 tablet (75 mg total) by mouth daily. 90 tablet 3  . Coenzyme Q10 (CO Q-10) 300 MG CAPS Take 1 tablet by mouth daily.     Marland Kitchen latanoprost (XALATAN) 0.005 % ophthalmic solution Place 1 drop into both eyes at bedtime.    Marland Kitchen loratadine (CLARITIN) 10 MG tablet Take 10 mg by mouth as needed.     . mesalamine (APRISO) 0.375 g 24 hr capsule 1.5 mg daily  =  4 tablets once daily 120 capsule 6  . Multiple Vitamin (MULTIVITAMIN) tablet Take 1 tablet by mouth daily.    . simvastatin  (ZOCOR) 20 MG tablet Take 1 tablet (20 mg total) by mouth daily. 90 tablet 3  . timolol (TIMOPTIC) 0.5 % ophthalmic solution Place 1 drop into the left eye 2 (two) times daily.     No current facility-administered medications on file prior to visit.     Past Medical History:  Diagnosis Date  . ALLERGIC RHINITIS   . ANEMIA, IRON DEFICIENCY   . Anxiety state, unspecified   . Arm pain    Right arm discomfort with some radiation from the biceps down to the wrist..  January, 2014  . CAD (coronary artery disease)    [name] / nuclear 2005, excellent tolerance no scar or ischemia, EF 51% / nuclear, February, 2012, no scar or ischemia / catheterization March 26, 2010.. 10% in-stent restenosis, minimal other nonobstructive coronary disease, ejection fraction 55%., excellent result; ETT 9/13: ex 13:14, no CP, no ischemic ECG changes  . Cataract   . Ejection fraction   . Esophagitis   . GERD (gastroesophageal reflux disease)   . Glaucoma   . Hyperlipidemia   . INGUINAL HERNIA   . Osteopenia   . OSTEOPENIA   . Pneumonia   . ULCERATIVE COLITIS-UNIVERSAL   . VITAMIN D DEFICIENCY     Past Surgical History:  Procedure Laterality Date  . ABDOMINAL HERNIA REPAIR  2006  .  ANGIOPLASTY  2004   stent 2004  . COLONOSCOPY    . CORONARY ANGIOPLASTY WITH STENT PLACEMENT    . rhinoseptoplasty    . TONSILLECTOMY      Social History   Socioeconomic History  . Marital status: Married    Spouse name: Not on file  . Number of children: 1  . Years of education: Not on file  . Highest education level: Not on file  Occupational History  . Occupation: retired    Fish farm manager: RETIRED  Social Needs  . Financial resource strain: Not on file  . Food insecurity:    Worry: Not on file    Inability: Not on file  . Transportation needs:    Medical: Not on file    Non-medical: Not on file  Tobacco Use  . Smoking status: Never Smoker  . Smokeless tobacco: Never Used  . Tobacco comment:  Married, 1 stepson, 5 g-kids. Retired Emergency planning/management officer, now Forensic scientist and Sexual Activity  . Alcohol use: Yes    Alcohol/week: 0.6 oz    Types: 1 Glasses of wine per week    Comment: weekly at most  . Drug use: No  . Sexual activity: Yes    Partners: Female  Lifestyle  . Physical activity:    Days per week: Not on file    Minutes per session: Not on file  . Stress: Not on file  Relationships  . Social connections:    Talks on phone: Not on file    Gets together: Not on file    Attends religious service: Not on file    Active member of club or organization: Not on file    Attends meetings of clubs or organizations: Not on file    Relationship status: Not on file  Other Topics Concern  . Not on file  Social History Narrative   Exercising regularly: walks at least 5 miles a day, 6 days a week    Family History  Problem Relation Age of Onset  . Diabetes Mother   . Heart disease Mother   . Lung disease Mother        ILD  . Rheum arthritis Mother   . Heart disease Father 36       MI  . Asthma Sister   . Colon cancer Neg Hx   . Esophageal cancer Neg Hx   . Pancreatic cancer Neg Hx   . Kidney disease Neg Hx   . Liver disease Neg Hx     Review of Systems  Constitutional: Negative for chills and fever.  Genitourinary: Negative for scrotal swelling and testicular pain.       Objective:   Vitals:   05/31/17 1545  BP: 122/70  Pulse: (!) 54  Resp: 16  Temp: 97.8 F (36.6 C)  SpO2: 98%   BP Readings from Last 3 Encounters:  05/31/17 122/70  04/13/17 104/70  03/08/17 102/64   Wt Readings from Last 3 Encounters:  05/31/17 149 lb (67.6 kg)  04/13/17 150 lb (68 kg)  03/08/17 151 lb (68.5 kg)   Body mass index is 22.49 kg/m.   Physical Exam  Constitutional: He appears well-developed and well-nourished. No distress.  Genitourinary:  Genitourinary Comments: Right suprapubic bulge that is mildly tender with palpation, increased with coughing    Skin: Skin is warm and dry. He is not diaphoretic. No erythema.           Assessment & Plan:    See Problem List for  Assessment and Plan of chronic medical problems.

## 2017-05-31 ENCOUNTER — Ambulatory Visit (INDEPENDENT_AMBULATORY_CARE_PROVIDER_SITE_OTHER): Payer: BLUE CROSS/BLUE SHIELD | Admitting: Internal Medicine

## 2017-05-31 ENCOUNTER — Encounter: Payer: Self-pay | Admitting: Internal Medicine

## 2017-05-31 DIAGNOSIS — K4090 Unilateral inguinal hernia, without obstruction or gangrene, not specified as recurrent: Secondary | ICD-10-CM

## 2017-05-31 NOTE — Patient Instructions (Signed)
A referral was ordered surgery - they will call you to schedule an appointment.     Inguinal Hernia, Adult An inguinal hernia is when fat or the intestines push through the area where the leg meets the lower abdomen (groin) and create a rounded lump (bulge). This condition develops over time. There are three types of inguinal hernias. These types include:  Hernias that can be pushed back into the belly (are reducible).  Hernias that are not reducible (are incarcerated).  Hernias that are not reducible and lose their blood supply (are strangulated). This type of hernia requires emergency surgery.  What are the causes? This condition is caused by having a weak spot in the muscles or tissue. This weakness lets the hernia poke through. This condition can be triggered by:  Suddenly straining the muscles of the lower abdomen.  Lifting heavy objects.  Straining to have a bowel movement. Difficult bowel movements (constipation) can lead to this.  Coughing.  What increases the risk? This condition is more likely to develop in:  Men.  Pregnant women.  People who: ? Are overweight. ? Work in jobs that require long periods of standing or heavy lifting. ? Have had an inguinal hernia before. ? Smoke or have lung disease. These factors can lead to long-lasting (chronic) coughing.  What are the signs or symptoms? Symptoms can depend on the size of the hernia. Often, a small inguinal hernia has no symptoms. Symptoms of a larger hernia include:  A lump in the groin. This is easier to see when the person is standing. It might not be visible when he or she is lying down.  Pain or burning in the groin. This occurs especially when lifting, straining, or coughing.  A dull ache or a feeling of pressure in the groin.  A lump in the scrotum in men.  Symptoms of a strangulated inguinal hernia can include:  A bulge in the groin that is very painful and tender to the touch.  A bulge that turns  red or purple.  Fever, nausea, and vomiting.  The inability to have a bowel movement or to pass gas.  How is this diagnosed? This condition is diagnosed with a medical history and physical exam. Your health care provider may feel your groin area and ask you to cough. How is this treated? Treatment for this condition varies depending on the size of your hernia and whether you have symptoms. If you do not have symptoms, your health care provider may have you watch your hernia carefully and come in for follow-up visits. If your hernia is larger or if you have symptoms, your treatment will include surgery. Follow these instructions at home: Lifestyle  Drink enough fluid to keep your urine clear or pale yellow.  Eat a diet that includes a lot of fiber. Eat plenty of fruits, vegetables, and whole grains. Talk with your health care provider if you have questions.  Avoid lifting heavy objects.  Avoid standing for long periods of time.  Do not use tobacco products, including cigarettes, chewing tobacco, or e-cigarettes. If you need help quitting, ask your health care provider.  Maintain a healthy weight. General instructions  Do not try to force the hernia back in.  Watch your hernia for any changes in color or size. Let your health care provider know if any changes occur.  Take over-the-counter and prescription medicines only as told by your health care provider.  Keep all follow-up visits as told by your health care provider.  This is important. Contact a health care provider if:  You have a fever.  You have new symptoms.  Your symptoms get worse. Get help right away if:  You have pain in the groin that suddenly gets worse.  A bulge in the groin gets bigger suddenly and does not go down.  You are a man and you have a sudden pain in the scrotum, or the size of your scrotum suddenly changes.  A bulge in the groin area becomes red or purple and is painful to the touch.  You  have nausea or vomiting that does not go away.  You feel your heart beating a lot more quickly than normal.  You cannot have a bowel movement or pass gas. This information is not intended to replace advice given to you by your health care provider. Make sure you discuss any questions you have with your health care provider. Document Released: 05/22/2008 Document Revised: 06/11/2015 Document Reviewed: 11/13/2013 Elsevier Interactive Patient Education  2018 [employer].

## 2017-05-31 NOTE — Assessment & Plan Note (Signed)
Exam consistent with right inguinal hernia Discussed monitoring versus surgery and he would like to go ahead for surgery Referred to surgery Discussed signs and symptoms of incarceration and advised him to call with any of the symptoms or with any questions

## 2017-06-07 ENCOUNTER — Encounter: Payer: Self-pay | Admitting: Internal Medicine

## 2017-06-08 ENCOUNTER — Encounter: Payer: Self-pay | Admitting: Cardiovascular Disease

## 2017-06-14 ENCOUNTER — Ambulatory Visit: Payer: Self-pay | Admitting: General Surgery

## 2017-06-14 DIAGNOSIS — K4090 Unilateral inguinal hernia, without obstruction or gangrene, not specified as recurrent: Secondary | ICD-10-CM | POA: Diagnosis not present

## 2017-06-19 NOTE — Telephone Encounter (Signed)
After reviewing the patient's concerns, it sounds like [name] may need to dictate a letter of medical necessity. Unfortunately, we are unable to do this.

## 2017-06-21 ENCOUNTER — Encounter (HOSPITAL_COMMUNITY): Payer: Self-pay | Admitting: Cardiovascular Disease

## 2017-06-21 ENCOUNTER — Other Ambulatory Visit: Payer: Medicare Other | Admitting: *Deleted

## 2017-06-21 DIAGNOSIS — H401122 Primary open-angle glaucoma, left eye, moderate stage: Secondary | ICD-10-CM | POA: Diagnosis not present

## 2017-06-21 DIAGNOSIS — H2513 Age-related nuclear cataract, bilateral: Secondary | ICD-10-CM | POA: Diagnosis not present

## 2017-06-21 DIAGNOSIS — H401111 Primary open-angle glaucoma, right eye, mild stage: Secondary | ICD-10-CM | POA: Diagnosis not present

## 2017-06-21 DIAGNOSIS — I2510 Atherosclerotic heart disease of native coronary artery without angina pectoris: Secondary | ICD-10-CM

## 2017-06-21 DIAGNOSIS — H5203 Hypermetropia, bilateral: Secondary | ICD-10-CM | POA: Diagnosis not present

## 2017-06-21 LAB — HEPATIC FUNCTION PANEL
ALT: 30 IU/L (ref 0–44)
AST: 29 IU/L (ref 0–40)
Albumin: 4.5 g/dL (ref 3.6–4.8)
Alkaline Phosphatase: 54 IU/L (ref 39–117)
BILIRUBIN, DIRECT: 0.16 mg/dL (ref 0.00–0.40)
Bilirubin Total: 0.7 mg/dL (ref 0.0–1.2)
TOTAL PROTEIN: 6.8 g/dL (ref 6.0–8.5)

## 2017-06-21 LAB — LIPID PANEL
CHOL/HDL RATIO: 3.1 ratio (ref 0.0–5.0)
CHOLESTEROL TOTAL: 154 mg/dL (ref 100–199)
HDL: 50 mg/dL (ref >39)
LDL CALC: 89 mg/dL (ref 0–99)
TRIGLYCERIDES: 73 mg/dL (ref 0–149)
VLDL CHOLESTEROL CAL: 15 mg/dL (ref 5–40)

## 2017-06-22 ENCOUNTER — Telehealth: Payer: Self-pay

## 2017-06-22 DIAGNOSIS — E7800 Pure hypercholesterolemia, unspecified: Secondary | ICD-10-CM

## 2017-06-22 MED ORDER — SIMVASTATIN 40 MG PO TABS: 20.0000 mg | ORAL_TABLET | Freq: Every day | ORAL | 3 refills | Status: DC

## 2017-06-22 NOTE — Telephone Encounter (Signed)
Sent message to MyChart "Per [name], your LDL (bad cholesterol ) is above 70, this needs to be 70 or lower. Your liver function test was okay. [name] would like to increase your Simvastatin (Zocor) to 40 mg by mouth daily and repeat lab work in 3 months. Please call the office to schedule lab work. [phone] Will send new prescription into your pharmacy. "

## 2017-06-22 NOTE — Telephone Encounter (Signed)
-----   Message from Josue Hector, MD sent at 06/21/2017  5:21 PM EDT ----- LDL above 70 LFTls ok increase zocor to 40 mg daily and repeat labs in 3 months

## 2017-06-22 NOTE — Telephone Encounter (Signed)
Follow Up:; ° ° °Returning your call. °

## 2017-06-26 ENCOUNTER — Telehealth: Payer: Self-pay | Admitting: Cardiovascular Disease

## 2017-06-26 DIAGNOSIS — E7800 Pure hypercholesterolemia, unspecified: Secondary | ICD-10-CM

## 2017-06-26 MED ORDER — SIMVASTATIN 40 MG PO TABS: 40.0000 mg | ORAL_TABLET | Freq: Every day | ORAL | 3 refills | Status: DC

## 2017-06-26 NOTE — Telephone Encounter (Signed)
Pt pharmacy is request a refill on simvastatin 40 mg tablet, taken daily. [name] increased this medication to Simvastatin 40 mg daily. Could you please correct this in the pt's chart and resend to pt's pharmacy. Please address

## 2017-06-26 NOTE — Telephone Encounter (Signed)
Sent in Simvastatin 40 mg by mouth daily.

## 2017-06-27 ENCOUNTER — Telehealth: Payer: Self-pay | Admitting: Cardiovascular Disease

## 2017-06-27 NOTE — Telephone Encounter (Signed)
Called pt and left message informing pt that his Rx was sent in correctly for simvastatin 40 mg tablet taken 1 tablet daily and if he has any other problems, questions or concerns to call our office.

## 2017-06-27 NOTE — Progress Notes (Signed)
Called and spoke w/ silvia , triage nurse, about clearance to stop plavix/ asa.  Silvia notates dr Kieth Brightly office note stated will have pt to stop plavix/ asa 5 days prior to surgery but does not see where clearance given by [name], cardiologist whom prescribes rx.  Eyvonne Mechanic will call [name] to get clearance.

## 2017-06-27 NOTE — Telephone Encounter (Signed)
° °  [facility] Pre-operative Risk Assessment    Request for surgical clearance:  What type of surgery is being performed? Open repair of right iInguinal Hernia _Needs this asap 1. When is this surgery scheduled? 07-03-17 2.What type of clearance is required (medical clearance vs. Pharmacy clearance to hold med vs. Both)? Both  Are there any medications that need to be held prior to surgery and how long? Plavix and Aspirinswhen does he needs to stop taking it-Can he hold for 5 days? and when does he start back 2. Practice name and name of physician performing surgery? Dr Lurena Joiner Kinsinger   3. What is your office phone number [phone]    7.   What is your office fax number Please put in EPIC  8.   Anesthesia type (None, local, MAC, general) ? General   Joseph Booker 06/27/2017, 12:26 PM  _________________________________________________________________   (provider comments below)

## 2017-06-27 NOTE — Telephone Encounter (Signed)
   Primary Cardiologist: Jenkins Rouge, MD  Chart reviewed as part of pre-operative protocol coverage. Patient was contacted 06/27/2017 in reference to pre-operative risk assessment for pending surgery as outlined below.  Joseph Booker was last seen 01/2017 by [name].  Pt has h/o CAD:  Stent to LAD 2004 Echo 05/14/13 EF 50-55% mild MR.  Myovue done 06/30/16 was normal with EF 55% no ischemia.   Unable to reach pt by phone. LVM to call preop clinic back. In the meantime, I will route to [name] to see if ok to hold ASA and Plavix. Lyda Jester, PA-C 06/27/2017, 2:31 PM

## 2017-06-28 ENCOUNTER — Encounter: Payer: Self-pay | Admitting: Cardiovascular Disease

## 2017-06-28 NOTE — Telephone Encounter (Signed)
Okay to hold aspirin and Plavix for 5 days prior to inguinal hernia repair.  Resume postop day 2.  Previous stent placed in 2004. Candee Furbish, MD

## 2017-06-28 NOTE — Telephone Encounter (Signed)
   Primary Cardiologist: Joseph Rouge, MD  Chart reviewed as part of pre-operative protocol coverage. Patient was contacted 06/27/2017 in reference to pre-operative risk assessment for pending surgery as outlined below.  Joseph Booker was last seen 01/2017 by [name].  Pt has h/o CAD: Stent to LAD 2004 Echo 05/14/13 EF 50-55% mild MR. Myovue done 06/30/16 was normal with EF 55% no ischemia.   Spoke with pt via phone. No angina. He walks 7 miles a day and no exertional CP or dyspnea.   Therefore, based on ACC/AHA guidelines, the patient would be at acceptable risk for the planned procedure without further cardiovascular testing.   Per Cardiologist, Okay to hold aspirin and Plavix for 5 days prior to inguinal hernia repair. Resume postop day 2.   I will route this recommendation to the requesting party via Epic fax function and remove from pre-op pool.  Please call with questions.  Lyda Jester, PA-C 06/28/2017, 1:21 PM

## 2017-06-29 ENCOUNTER — Encounter (HOSPITAL_BASED_OUTPATIENT_CLINIC_OR_DEPARTMENT_OTHER): Payer: Self-pay | Admitting: *Deleted

## 2017-06-29 ENCOUNTER — Other Ambulatory Visit: Payer: Self-pay

## 2017-06-29 NOTE — Progress Notes (Addendum)
Spoke w/ pt via phone for pre-op interview.  Npo after mn w/ exception clear liquid diet until 1130 at which pt to finish ensure pre-surgery drink.  Arrive at 1230.  Needs istat and ekg. Pt will do hibiclens showers hs before and am dos.  Instructions are printed and be placed in bag w/ drink and hibiclens for to pick-up today. Pt has cardiac clearance to plavix/asa per [name], dated 06-28-2017, in chart and epic.

## 2017-06-29 NOTE — Progress Notes (Signed)
Your procedure is scheduled on  ____12:30 PM_____  Report to [facility] AT  12:30 P. M._____   Call this number if you have problems the morning of surgery  :[phone].   OUR ADDRESS IS [address].  WE ARE LOCATED IN [facility].                                     REMEMBER:  DO NOT EAT FOOD  AFTER MIDNIGHT .  TAKE THESE MEDICATIONS MORNING OF SURGERY WITH A SIP OF WATER:  _______NONE_______                                    DO NOT WEAR LOTIONS, POWDERS, PERFUMES OR DEODORANT.  MEN MAY SHAVE FACE AND NECK. CONTACTS, GLASSES, OR DENTURES MAY NOT BE WORN TO SURGERY.                                    [facility] IS NOT RESPONSIBLE  FOR ANY BELONGINGS.                                                                    .                                                                                                    NO SOLID FOOD AFTER MIDNIGHT THE NIGHT PRIOR TO SURGERY. NOTHING BY MOUTH EXCEPT CLEAR LIQUIDS UNTIL 3 HOURS PRIOR TO [facility] SURGERY. PLEASE FINISH ENSURE DRINK PER SURGEON ORDER 3 HOURS PRIOR TO SCHEDULED SURGERY TIME WHICH NEEDS TO BE COMPLETED AT ____11:30 am_____.   CLEAR LIQUID DIET   Foods Allowed                                                                     Foods Excluded  Coffee and tea, regular and decaf                             liquids that you cannot  Plain Jell-O in any flavor                                             see through  such as: Fruit ices (not with fruit pulp)                                     milk, soups, orange juice  Iced Popsicles                                    All solid food Carbonated beverages, regular and diet                                    Cranberry, grape and apple juices Sports drinks like Gatorade Lightly seasoned clear broth or consume(fat free) Sugar, honey syrup  Sample Menu Breakfast                                Lunch                                      Supper Cranberry juice                    Beef broth                            Chicken broth Jell-O                                     Grape juice                           Apple juice Coffee or tea                        Jell-O                                      Popsicle                                                Coffee or tea                        Coffee or tea  _____________________________________________________________________  [facility] - Preparing for Surgery Before surgery, you can play an important role.  Because skin is not sterile, your skin needs to be as free of germs as possible.  You can reduce the number of germs on your skin by washing with CHG (chlorahexidine gluconate) soap before surgery.  CHG is an antiseptic cleaner which kills germs and bonds with the skin to continue killing germs even after washing. Please DO NOT use if you have an allergy to CHG or antibacterial soaps.  If your skin becomes reddened/irritated stop using the CHG and inform your nurse when you arrive at Short Stay. Do not shave (including legs and underarms) for at  least 48 hours prior to the first CHG shower.  You may shave your face/neck. Please follow these instructions carefully:  1.  Shower with CHG Soap the night before surgery and the  morning of Surgery.  2.  If you choose to wash your hair, wash your hair first as usual with your  normal  shampoo.  3.  After you shampoo, rinse your hair and body thoroughly to remove the  shampoo.                           4.  Use CHG as you would any other liquid soap.  You can apply chg directly  to the skin and wash                       Gently with a scrungie or clean washcloth.  5.  Apply the CHG Soap to your body ONLY FROM THE NECK DOWN.   Do not use on face/ open                           Wound or open sores. Avoid contact with eyes, ears mouth and genitals (private parts).                       Wash face,  Genitals (private parts) with your  normal soap.             6.  Wash thoroughly, paying special attention to the area where your surgery  will be performed.  7.  Thoroughly rinse your body with warm water from the neck down.  8.  DO NOT shower/wash with your normal soap after using and rinsing off  the CHG Soap.                9.  Pat yourself dry with a clean towel.            10.  Wear clean pajamas.            11.  Place clean sheets on your bed the night of your first shower and do not  sleep with pets. Day of Surgery : Do not apply any lotions/deodorants the morning of surgery.  Please wear clean clothes to the hospital/surgery center.

## 2017-07-03 ENCOUNTER — Ambulatory Visit (HOSPITAL_BASED_OUTPATIENT_CLINIC_OR_DEPARTMENT_OTHER)
Admission: RE | Admit: 2017-07-03 | Discharge: 2017-07-03 | Disposition: A | Discharge status: Home / Self Care | Payer: BLUE CROSS/BLUE SHIELD | Source: Ambulatory Visit | Attending: General Surgery | Admitting: General Surgery

## 2017-07-03 ENCOUNTER — Other Ambulatory Visit: Payer: Self-pay

## 2017-07-03 ENCOUNTER — Ambulatory Visit (HOSPITAL_BASED_OUTPATIENT_CLINIC_OR_DEPARTMENT_OTHER): Payer: BLUE CROSS/BLUE SHIELD | Admitting: Anesthesiology

## 2017-07-03 ENCOUNTER — Encounter (HOSPITAL_BASED_OUTPATIENT_CLINIC_OR_DEPARTMENT_OTHER)
Admission: RE | Disposition: A | Discharge status: Home / Self Care | Payer: Self-pay | Source: Ambulatory Visit | Attending: General Surgery

## 2017-07-03 ENCOUNTER — Encounter (HOSPITAL_BASED_OUTPATIENT_CLINIC_OR_DEPARTMENT_OTHER): Payer: Self-pay | Admitting: *Deleted

## 2017-07-03 DIAGNOSIS — D176 Benign lipomatous neoplasm of spermatic cord: Secondary | ICD-10-CM | POA: Insufficient documentation

## 2017-07-03 DIAGNOSIS — K4090 Unilateral inguinal hernia, without obstruction or gangrene, not specified as recurrent: Secondary | ICD-10-CM | POA: Insufficient documentation

## 2017-07-03 DIAGNOSIS — G8918 Other acute postprocedural pain: Secondary | ICD-10-CM | POA: Diagnosis not present

## 2017-07-03 DIAGNOSIS — K219 Gastro-esophageal reflux disease without esophagitis: Secondary | ICD-10-CM | POA: Insufficient documentation

## 2017-07-03 DIAGNOSIS — I2510 Atherosclerotic heart disease of native coronary artery without angina pectoris: Secondary | ICD-10-CM | POA: Diagnosis not present

## 2017-07-03 DIAGNOSIS — H409 Unspecified glaucoma: Secondary | ICD-10-CM | POA: Insufficient documentation

## 2017-07-03 DIAGNOSIS — E785 Hyperlipidemia, unspecified: Secondary | ICD-10-CM | POA: Diagnosis not present

## 2017-07-03 DIAGNOSIS — Z7902 Long term (current) use of antithrombotics/antiplatelets: Secondary | ICD-10-CM | POA: Diagnosis not present

## 2017-07-03 DIAGNOSIS — I739 Peripheral vascular disease, unspecified: Secondary | ICD-10-CM | POA: Diagnosis not present

## 2017-07-03 DIAGNOSIS — Z79899 Other long term (current) drug therapy: Secondary | ICD-10-CM | POA: Diagnosis not present

## 2017-07-03 HISTORY — PX: INSERTION OF MESH: SHX5868

## 2017-07-03 HISTORY — DX: Thoracic aortic aneurysm, without rupture: I71.2

## 2017-07-03 HISTORY — PX: INGUINAL HERNIA REPAIR: SHX194

## 2017-07-03 HISTORY — DX: Ulcerative (chronic) pancolitis without complications: K51.00

## 2017-07-03 HISTORY — DX: Nocturia: R35.1

## 2017-07-03 HISTORY — DX: Personal history of other specified conditions: Z87.898

## 2017-07-03 HISTORY — DX: Unspecified glaucoma: H40.9

## 2017-07-03 HISTORY — DX: Presence of spectacles and contact lenses: Z97.3

## 2017-07-03 HISTORY — DX: Aneurysm of the ascending aorta, without rupture: I71.21

## 2017-07-03 HISTORY — DX: Allergic rhinitis, unspecified: J30.9

## 2017-07-03 LAB — POCT I-STAT 4, (NA,K, GLUC, HGB,HCT)
Glucose, Bld: 102 mg/dL — ABNORMAL HIGH (ref 65–99)
HEMATOCRIT: 41 % (ref 39.0–52.0)
HEMOGLOBIN: 13.9 g/dL (ref 13.0–17.0)
Potassium: 4.1 mmol/L (ref 3.5–5.1)
Sodium: 142 mmol/L (ref 135–145)

## 2017-07-03 SURGERY — REPAIR, HERNIA, INGUINAL, ADULT
Anesthesia: General | Site: Inguinal | Laterality: Right

## 2017-07-03 MED ORDER — GABAPENTIN 300 MG PO CAPS
ORAL_CAPSULE | ORAL | Status: AC
  Filled 2017-07-03: qty 1

## 2017-07-03 MED ORDER — GABAPENTIN 300 MG PO CAPS
300.0000 mg | ORAL_CAPSULE | ORAL | Status: AC
  Administered 2017-07-03: 300 mg via ORAL
  Filled 2017-07-03: qty 1

## 2017-07-03 MED ORDER — IBUPROFEN 800 MG PO TABS: 800.0000 mg | ORAL_TABLET | Freq: Three times a day (TID) | ORAL | 0 refills | Status: DC | PRN

## 2017-07-03 MED ORDER — OXYCODONE HCL 5 MG/5ML PO SOLN
5.0000 mg | Freq: Once | ORAL | Status: AC | PRN
  Filled 2017-07-03: qty 5

## 2017-07-03 MED ORDER — OXYCODONE HCL 5 MG PO TABS
5.0000 mg | ORAL_TABLET | Freq: Once | ORAL | Status: AC | PRN
  Administered 2017-07-03: 5 mg via ORAL
  Filled 2017-07-03: qty 1

## 2017-07-03 MED ORDER — FENTANYL CITRATE (PF) 100 MCG/2ML IJ SOLN
100.0000 ug | Freq: Once | INTRAMUSCULAR | Status: AC
Start: 2017-07-03 — End: 2017-07-03
  Administered 2017-07-03: 100 ug via INTRAVENOUS
  Filled 2017-07-03: qty 2

## 2017-07-03 MED ORDER — CHLORHEXIDINE GLUCONATE CLOTH 2 % EX PADS
6.0000 | MEDICATED_PAD | Freq: Once | CUTANEOUS | Status: DC
  Filled 2017-07-03: qty 6

## 2017-07-03 MED ORDER — MIDAZOLAM HCL 2 MG/2ML IJ SOLN
INTRAMUSCULAR | Status: AC
  Filled 2017-07-03: qty 2

## 2017-07-03 MED ORDER — DEXAMETHASONE SODIUM PHOSPHATE 10 MG/ML IJ SOLN
INTRAMUSCULAR | Status: DC | PRN
  Administered 2017-07-03: 10 mg via INTRAVENOUS

## 2017-07-03 MED ORDER — PROPOFOL 10 MG/ML IV BOLUS
INTRAVENOUS | Status: AC
  Filled 2017-07-03: qty 20

## 2017-07-03 MED ORDER — OXYCODONE HCL 5 MG PO TABS
ORAL_TABLET | ORAL | Status: AC
  Filled 2017-07-03: qty 1

## 2017-07-03 MED ORDER — ACETAMINOPHEN 500 MG PO TABS
1000.0000 mg | ORAL_TABLET | ORAL | Status: AC
  Administered 2017-07-03: 1000 mg via ORAL
  Filled 2017-07-03: qty 2

## 2017-07-03 MED ORDER — FENTANYL CITRATE (PF) 100 MCG/2ML IJ SOLN
25.0000 ug | INTRAMUSCULAR | Status: DC | PRN
  Administered 2017-07-03: 25 ug via INTRAVENOUS
  Filled 2017-07-03: qty 1

## 2017-07-03 MED ORDER — FENTANYL CITRATE (PF) 100 MCG/2ML IJ SOLN
INTRAMUSCULAR | Status: AC
  Filled 2017-07-03: qty 2

## 2017-07-03 MED ORDER — CEFAZOLIN SODIUM-DEXTROSE 2-4 GM/100ML-% IV SOLN
2.0000 g | INTRAVENOUS | Status: AC
  Administered 2017-07-03: 2 g via INTRAVENOUS
  Filled 2017-07-03: qty 100

## 2017-07-03 MED ORDER — LIDOCAINE 2% (20 MG/ML) 5 ML SYRINGE
INTRAMUSCULAR | Status: DC | PRN
  Administered 2017-07-03: 60 mg via INTRAVENOUS

## 2017-07-03 MED ORDER — ONDANSETRON HCL 4 MG/2ML IJ SOLN
INTRAMUSCULAR | Status: DC | PRN
  Administered 2017-07-03: 4 mg via INTRAVENOUS

## 2017-07-03 MED ORDER — EPHEDRINE SULFATE-NACL 50-0.9 MG/10ML-% IV SOSY
PREFILLED_SYRINGE | INTRAVENOUS | Status: DC | PRN
  Administered 2017-07-03: 10 mg via INTRAVENOUS

## 2017-07-03 MED ORDER — ACETAMINOPHEN 500 MG PO TABS
ORAL_TABLET | ORAL | Status: AC
  Filled 2017-07-03: qty 2

## 2017-07-03 MED ORDER — ENSURE PRE-SURGERY PO LIQD
296.0000 mL | Freq: Once | ORAL | Status: DC
  Filled 2017-07-03: qty 296

## 2017-07-03 MED ORDER — LIDOCAINE 2% (20 MG/ML) 5 ML SYRINGE
INTRAMUSCULAR | Status: AC
  Filled 2017-07-03: qty 5

## 2017-07-03 MED ORDER — HYDROCODONE-ACETAMINOPHEN 5-325 MG PO TABS: 1.0000 | ORAL_TABLET | Freq: Four times a day (QID) | ORAL | 0 refills | Status: DC | PRN

## 2017-07-03 MED ORDER — MIDAZOLAM HCL 2 MG/2ML IJ SOLN
2.0000 mg | Freq: Once | INTRAMUSCULAR | Status: AC
  Administered 2017-07-03: 2 mg via INTRAVENOUS
  Filled 2017-07-03: qty 2

## 2017-07-03 MED ORDER — LACTATED RINGERS IV SOLN
INTRAVENOUS | Status: DC
  Administered 2017-07-03 (×2): via INTRAVENOUS
  Filled 2017-07-03: qty 1000

## 2017-07-03 MED ORDER — EPHEDRINE 5 MG/ML INJ
INTRAVENOUS | Status: AC
  Filled 2017-07-03: qty 10

## 2017-07-03 MED ORDER — BUPIVACAINE-EPINEPHRINE (PF) 0.5% -1:200000 IJ SOLN
INTRAMUSCULAR | Status: DC | PRN
  Administered 2017-07-03: 30 mL

## 2017-07-03 MED ORDER — CEFAZOLIN SODIUM-DEXTROSE 2-4 GM/100ML-% IV SOLN
INTRAVENOUS | Status: AC
  Filled 2017-07-03: qty 100

## 2017-07-03 MED ORDER — PROPOFOL 10 MG/ML IV BOLUS
INTRAVENOUS | Status: DC | PRN
  Administered 2017-07-03: 150 mg via INTRAVENOUS

## 2017-07-03 MED ORDER — ONDANSETRON HCL 4 MG/2ML IJ SOLN
4.0000 mg | Freq: Once | INTRAMUSCULAR | Status: DC | PRN
  Filled 2017-07-03: qty 2

## 2017-07-03 SURGICAL SUPPLY — 50 items
ADH SKN CLS APL DERMABOND .7 (GAUZE/BANDAGES/DRESSINGS) ×1
BLADE CLIPPER SENSICLIP SURGIC (BLADE) ×2 IMPLANT
BLADE HEX COATED 2.75 (ELECTRODE) ×2 IMPLANT
BLADE SURG 15 STRL LF DISP TIS (BLADE) ×1 IMPLANT
BLADE SURG 15 STRL SS (BLADE) ×2
CANISTER SUCT 3000ML PPV (MISCELLANEOUS) IMPLANT
CELLS DAT CNTRL 66122 CELL SVR (MISCELLANEOUS) IMPLANT
CHLORAPREP W/TINT 26ML (MISCELLANEOUS) ×2 IMPLANT
COVER BACK TABLE 60X90IN (DRAPES) ×2 IMPLANT
COVER LIGHT HANDLE  1/PK (MISCELLANEOUS) ×1
COVER LIGHT HANDLE 1/PK (MISCELLANEOUS) IMPLANT
COVER MAYO STAND STRL (DRAPES) ×2 IMPLANT
DERMABOND ADVANCED (GAUZE/BANDAGES/DRESSINGS) ×1
DERMABOND ADVANCED .7 DNX12 (GAUZE/BANDAGES/DRESSINGS) ×1 IMPLANT
DRAIN PENROSE 18X1/4 LTX STRL (WOUND CARE) ×1 IMPLANT
DRAPE LAPAROSCOPIC ABDOMINAL (DRAPES) ×2 IMPLANT
DRAPE UTILITY XL STRL (DRAPES) ×2 IMPLANT
ELECT REM PT RETURN 9FT ADLT (ELECTROSURGICAL) ×2
ELECTRODE REM PT RTRN 9FT ADLT (ELECTROSURGICAL) ×1 IMPLANT
GLOVE BIOGEL PI IND STRL 7.0 (GLOVE) ×1 IMPLANT
GLOVE BIOGEL PI INDICATOR 7.0 (GLOVE) ×1
GLOVE SURG SS PI 7.0 STRL IVOR (GLOVE) ×2 IMPLANT
GOWN STRL REUS W/ TWL LRG LVL3 (GOWN DISPOSABLE) ×2 IMPLANT
GOWN STRL REUS W/TWL LRG LVL3 (GOWN DISPOSABLE) ×6
KIT TURNOVER CYSTO (KITS) ×2 IMPLANT
MANIFOLD NEPTUNE II (INSTRUMENTS) ×1 IMPLANT
MESH BARD SOFT 3X6IN (Mesh General) ×1 IMPLANT
NDL HYPO 25X1 1.5 SAFETY (NEEDLE) ×1 IMPLANT
NEEDLE HYPO 25X1 1.5 SAFETY (NEEDLE) ×2 IMPLANT
NS IRRIG 500ML POUR BTL (IV SOLUTION) ×2 IMPLANT
PACK BASIN DAY SURGERY FS (CUSTOM PROCEDURE TRAY) ×2 IMPLANT
PAD ARMBOARD 7.5X6 YLW CONV (MISCELLANEOUS) ×2 IMPLANT
PENCIL BUTTON HOLSTER BLD 10FT (ELECTRODE) ×2 IMPLANT
RETRACTOR WND ALEXIS 18 MED (MISCELLANEOUS) IMPLANT
RTRCTR WOUND ALEXIS 18CM MED (MISCELLANEOUS)
SPONGE LAP 4X18 RFD (DISPOSABLE) ×1 IMPLANT
SUT MNCRL AB 4-0 PS2 18 (SUTURE) ×2 IMPLANT
SUT PDS AB 2-0 CT2 27 (SUTURE) ×1 IMPLANT
SUT PROLENE 2 0 CT2 30 (SUTURE) ×5 IMPLANT
SUT SILK 3 0 TIES 17X18 (SUTURE)
SUT SILK 3-0 18XBRD TIE BLK (SUTURE) IMPLANT
SUT VIC AB 2-0 SH 27 (SUTURE) ×2
SUT VIC AB 2-0 SH 27XBRD (SUTURE) ×1 IMPLANT
SUT VIC AB 3-0 SH 27 (SUTURE) ×2
SUT VIC AB 3-0 SH 27X BRD (SUTURE) ×1 IMPLANT
SYR BULB IRRIGATION 50ML (SYRINGE) ×1 IMPLANT
SYR CONTROL 10ML LL (SYRINGE) ×2 IMPLANT
TOWEL OR 17X24 6PK STRL BLUE (TOWEL DISPOSABLE) ×3 IMPLANT
TUBE CONNECTING 12X1/4 (SUCTIONS) ×2 IMPLANT
YANKAUER SUCT BULB TIP NO VENT (SUCTIONS) ×2 IMPLANT

## 2017-07-03 NOTE — Anesthesia Postprocedure Evaluation (Signed)
Anesthesia Post Note  Patient: Joseph Booker  Procedure(s) Performed: OPEN REPAIR OF RIGHT INGUINAL HERNIA WITH MESH (Right Inguinal) INSERTION OF MESH (Right Inguinal)     Patient location during evaluation: PACU Anesthesia Type: General Level of consciousness: awake and alert Pain management: pain level controlled Vital Signs Assessment: post-procedure vital signs reviewed and stable Respiratory status: spontaneous breathing, nonlabored ventilation and respiratory function stable Cardiovascular status: blood pressure returned to baseline and stable Postop Assessment: no apparent nausea or vomiting Anesthetic complications: no    Last Vitals:  Vitals:   07/03/17 1615 07/03/17 1730  BP: (!) 154/83 (!) 152/90  Pulse: (!) 51   Resp: 11 16  Temp:  36.5 C  SpO2: 100% 100%    Last Pain:  Vitals:   07/03/17 1715  TempSrc:   PainSc: 3                  Audry Pili

## 2017-07-03 NOTE — Op Note (Signed)
Preop diagnosis: right inguinal hernia  Postop diagnosis: right direct inguinal hernia  Procedure: open Right inguinal hernia repair with mesh  Surgeon: Gurney Maxin, M.D.  Asst: Neena Rhymes, PA student  Anesthesia: Gen.   Indications for procedure: Joseph Booker is a 67 y.o. male with symptoms of pain and enlarging Right inguinal hernia(s). After discussing risks, alternatives and benefits he decided on open repair and was brought to day surgery for repair.  Description of procedure: The patient was brought into the operative suite, placed supine. Anesthesia was administered with endotracheal tube. Patient was strapped in place. The patient was prepped and draped in the usual sterile fashion.  The anterior superior iliac spine and pubic tubercle were identified on the Right side. An incision was made 1cm above the connecting line, representative of the location of the inguinal ligament. The subcutaneous tissue was bluntly dissected, scarpa's fascia was dissected away. The external abdominal oblique fascia was identified and sharply opened down to the external inguinal ring. The conjoint tendon and inguinal ligament were identified. The cord structures and sac were dissected free of the surrounding tissue in 360 degrees. A penrose drain was used to encircle the contents. The cremasteric fibers were dissected free of the contents of the cord and hernia sac. The cord structures (vessels and vas deferens) were identified and carefully dissected away from the hernia sac. The hernia was direct in nature. There was also a lipoma of the cord that was removed with cautery. The hernia sac was dissected down to the internal inguinal ring. Preperitoneal fat was identified showing appropriate dissection. The sac was then reduced into the preperitoneal space. 3 2-0 PDS sutures were used to appose the inguinal ligament to the conjoint tendon in interrupted fashion. A 3x6 Bard Soft mesh was then used to close  the defect and reinforce the floor. The mesh was sutured to the lacunar ligament and inguinal ligament using a 2-0 prolene in running fashion. Next the superior edge of the mesh was sutured to the conjoined tendon using a 2-0 running Prolene. An additional 2-0 Prolene was used to suture the tail ends of the mesh together re-creating the deep ring. Cord structures are running in a neutral position through the mesh. Next the external abdominal oblique fascia was closed with a 2-0 Vicryl in running fashion to re-create the external inguinal ring. Scarpa's fascia was closed with 3-0 Vicryl in running fashion. Skin was closed with a 4-0 Monocryl subcuticular stitch in running fashion. Dermabond place for dressing. Patient woke from anesthesia and brought to PACU in stable condition. All counts are correct.    Findings: right direct inguinal hernia  Specimen: none  Blood loss: 10 ml  Local anesthesia: preop TAP block  Complications: none  Implant: 3x6in bard soft mesh  Gurney Maxin, M.D. General, Bariatric, & Minimally Invasive Surgery [facility], [place] 3:14 PM 07/03/2017

## 2017-07-03 NOTE — Anesthesia Procedure Notes (Signed)
Procedure Name: LMA Insertion Date/Time: 07/03/2017 2:10 PM Performed by: Suan Halter, CRNA Pre-anesthesia Checklist: Patient identified, Emergency Drugs available, Suction available and Patient being monitored Patient Re-evaluated:Patient Re-evaluated prior to induction Oxygen Delivery Method: Circle system utilized Preoxygenation: Pre-oxygenation with 100% oxygen Induction Type: IV induction Ventilation: Mask ventilation without difficulty LMA: LMA inserted LMA Size: 4.0 Number of attempts: 1 Airway Equipment and Method: Bite block Placement Confirmation: positive ETCO2 Tube secured with: Tape Dental Injury: Teeth and Oropharynx as per pre-operative assessment

## 2017-07-03 NOTE — Anesthesia Preprocedure Evaluation (Signed)
Anesthesia Evaluation  Patient identified by MRN, date of birth, ID band Patient awake    Reviewed: Allergy & Precautions, NPO status , Patient's Chart, lab work & pertinent test results  Airway Mallampati: II  TM Distance: >3 FB Neck ROM: Full    Dental  (+) Dental Advisory Given   Pulmonary    breath sounds clear to auscultation       Cardiovascular + CAD and + Peripheral Vascular Disease   Rhythm:Regular Rate:Normal  Hx thoracic AAA  '18 Myoperfusion - Nuclear stress EF: 55%. Blood pressure demonstrated a hypertensive response to exercise. There was no ST segment deviation noted during stress. The study is normal. This is a low risk study. The left ventricular ejection fraction is normal (55-65%).  '18 TTE - EF 55% to 60%. Trivial AV. 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 RV cavity size was mildly dilated.    Neuro/Psych Anxiety negative neurological ROS     GI/Hepatic Neg liver ROS, PUD, GERD  Controlled and Medicated,    UC   Endo/Other  negative endocrine ROS  Renal/GU negative Renal ROS   Nocturia    Musculoskeletal negative musculoskeletal ROS (+)   Abdominal   Peds  Hematology negative hematology ROS (+)   Anesthesia Other Findings   Reproductive/Obstetrics                             Anesthesia Physical Anesthesia Plan  ASA: III  Anesthesia Plan: General   Post-op Pain Management:  Regional for Post-op pain   Induction: Intravenous  PONV Risk Score and Plan: 3 and Treatment may vary due to age or medical condition, Ondansetron, Dexamethasone and Midazolam  Airway Management Planned: LMA and Oral ETT  Additional Equipment: None  Intra-op Plan:   Post-operative Plan: Extubation in OR  Informed Consent: I have reviewed the patients History and Physical, chart, labs and discussed the procedure including the risks, benefits and alternatives for the  proposed anesthesia with the patient or authorized representative who has indicated his/her understanding and acceptance.   Dental advisory given  Plan Discussed with: CRNA and Anesthesiologist  Anesthesia Plan Comments: (LMA, unless paralysis requested, then ETT)        Anesthesia Quick Evaluation

## 2017-07-03 NOTE — Anesthesia Procedure Notes (Signed)
Anesthesia Regional Block: TAP block   Pre-Anesthetic Checklist: ,, timeout performed, Correct Patient, Correct Site, Correct Laterality, Correct Procedure, Correct Position, site marked, Risks and benefits discussed,  Surgical consent,  Pre-op evaluation,  At surgeon's request and post-op pain management  Laterality: Right  Prep: chloraprep       Needles:  Injection technique: Single-shot  Needle Type: Echogenic Needle     Needle Length: 9cm  Needle Gauge: 21     Additional Needles:   Narrative:  Start time: 07/03/2017 1:50 PM End time: 07/03/2017 1:54 PM Injection made incrementally with aspirations every 5 mL.  Performed by: Personally  Anesthesiologist: Audry Pili, MD  Additional Notes: No pain on injection. No increased resistance to injection. Injection made in 5cc increments. Good needle visualization. Patient tolerated the procedure well.

## 2017-07-03 NOTE — Progress Notes (Signed)
1348 time out with Dr. Fransisco Beau and Nurse for block. 1349 Versed and Fentanyl given for sedation. 1354. Block completed. Tolerated well.

## 2017-07-03 NOTE — Transfer of Care (Signed)
Immediate Anesthesia Transfer of Care Note  Patient: Joseph Booker  Procedure(s) Performed: Procedure(s) (LRB): OPEN REPAIR OF RIGHT INGUINAL HERNIA WITH MESH (Right) INSERTION OF MESH (Right)  Patient Location: PACU  Anesthesia Type: General  Level of Consciousness: awake, oriented, sedated and patient cooperative  Airway & Oxygen Therapy: Patient Spontanous Breathing and Patient connected to face mask oxygen  Post-op Assessment: Report given to PACU RN and Post -op Vital signs reviewed and stable  Post vital signs: Reviewed and stable  Complications: No apparent anesthesia complications  Last Vitals:  Vitals Value Taken Time  BP 148/87 07/03/2017  3:26 PM  Temp 36.4 C 07/03/2017  3:26 PM  Pulse 55 07/03/2017  3:26 PM  Resp 13 07/03/2017  3:26 PM  SpO2 98 % 07/03/2017  3:26 PM    Last Pain:  Vitals:   07/03/17 1303  TempSrc:   PainSc: 1       Patients Stated Pain Goal: 3 (07/03/17 1303)

## 2017-07-03 NOTE — H&P (Signed)
Joseph Booker is an 67 y.o. male.   Chief Complaint: right inguinal hernia HPI: 67 yo male with symptomatic right inguinal hernia   Past Medical History:  Diagnosis Date  . Allergic rhinitis   . Anxiety state, unspecified   . CAD (coronary artery disease)    [name] / nuclear 2005, excellent tolerance no scar or ischemia, EF 51% / nuclear, February, 2012, no scar or ischemia / catheterization March 26, 2010.. 10% in-stent restenosis, minimal other nonobstructive coronary disease, ejection fraction 55%., excellent result; ETT 9/13: ex 13:14, no CP, no ischemic ECG changes  . Glaucoma, left eye   . History of exercise intolerance 05/23/2013   cardiopulmonary exercise test-- normal functional capacity when compared to matched sedentary norms, borderline ventilatory limitation with the patient's exercise tidal volume reaching 90% of the measured inspiratory capacity ( pt reports no dyspnea), did not appear to be circulatory limitation, normal CPX test  . Hyperlipidemia   . Nocturia   . Osteopenia   . Thoracic ascending aortic aneurysm [facility])    followed by [name]---  last CT 12-29-2016 ,  4.5cm  . Ulcerative colitis, universal ([place])    GI-- [name]--  dx 1990s  (per note mild Mayo Score 1)  . Wears glasses     Past Surgical History:  Procedure Laterality Date  . CARDIAC CATHETERIZATION  03/26/2010    dr Dian Queen   single vessel CAD w/ patent mLAD stent w/ 10% in-stent restenosis w/ mild to moderate disease in the remainder LAD w/ no lesions that appearedto be flow-limitinf/  normal LVFSF, ef 55%  . CARDIOVASCULAR STRESS TEST  06-30-2016   [name]   Low risk nuclear study w/ no evidence ishcemia/  normal LV function and wall motion,  nuclear stress ef 55%  . COLONOSCOPY  last one 10-27-2016  . CORONARY ANGIOPLASTY WITH STENT PLACEMENT  07-25-2002   [name]   PTCA w/  DES x1 to mLAD  . INGUINAL HERNIA REPAIR Left 2006  . SEPTOPLASTY  1987   w/ RHINOPLASTY  .  TONSILLECTOMY  1959  . TRANSTHORACIC ECHOCARDIOGRAM  06-30-2016     [name]   ef 55-60%/  trivial AR/ mild dilated ascending aorta, 48m/  trivial MR/  mild PR and TR     Family History  Problem Relation Age of Onset  . Diabetes Mother   . Heart disease Mother   . Lung disease Mother        ILD  . Rheum arthritis Mother   . Heart disease Father [phone]      MI  . Asthma Sister   . Colon cancer Neg Hx   . Esophageal cancer Neg Hx   . Pancreatic cancer Neg Hx   . Kidney disease Neg Hx   . Liver disease Neg Hx    Social History:  reports that he has never smoked. He has never used smokeless tobacco. He reports that he drinks alcohol. He reports that he does not use drugs.  Allergies: No Known Allergies  Medications Prior to Admission  Medication Sig Dispense Refill  . aspirin 81 MG tablet Take 81 mg by mouth daily.      . clopidogrel (PLAVIX) 75 MG tablet Take 1 tablet (75 mg total) by mouth daily. 90 tablet 3  . Coenzyme Q10 (CO Q-10) 300 MG CAPS Take 1 tablet by mouth every morning.     . latanoprost (XALATAN) 0.005 % ophthalmic solution Place 1 drop into both eyes at bedtime.    .Marland Kitchen  loratadine (CLARITIN) 10 MG tablet Take 10 mg by mouth as needed.     . mesalamine (APRISO) 0.375 g 24 hr capsule 1.5 mg daily  =  4 tablets once daily (Patient taking differently: Take by mouth daily. 1.5 mg daily  =  4 tablets once daily) 120 capsule 6  . Multiple Vitamin (MULTIVITAMIN) tablet Take 1 tablet by mouth daily.    . simvastatin (ZOCOR) 40 MG tablet Take 1 tablet (40 mg total) by mouth daily. (Patient taking differently: Take 40 mg by mouth every evening. ) 90 tablet 3  . timolol (TIMOPTIC) 0.5 % ophthalmic solution Place 1 drop into the left eye 2 (two) times daily.      Results for orders placed or performed during the hospital encounter of 07/03/17 (from the past 48 hour(s))  I-STAT 4, (NA,K, GLUC, HGB,HCT)     Status: Abnormal   Collection Time: 07/03/17  1:15 PM  Result Value Ref  Range   Sodium 142 135 - 145 mmol/L   Potassium 4.1 3.5 - 5.1 mmol/L   Glucose, Bld 102 (H) 65 - 99 mg/dL   HCT 41.0 39.0 - 52.0 %   Hemoglobin 13.9 13.0 - 17.0 g/dL   No results found.  Review of Systems  Constitutional: Negative for chills and fever.  HENT: Negative for hearing loss.   Eyes: Negative for blurred vision and double vision.  Respiratory: Negative for cough and hemoptysis.   Cardiovascular: Negative for chest pain and palpitations.  Gastrointestinal: Positive for abdominal pain. Negative for nausea and vomiting.  Genitourinary: Negative for dysuria and urgency.  Musculoskeletal: Negative for myalgias and neck pain.  Skin: Negative for itching and rash.  Neurological: Negative for dizziness, tingling and headaches.  Endo/Heme/Allergies: Does not bruise/bleed easily.  Psychiatric/Behavioral: Negative for depression and suicidal ideas.    Blood pressure 135/86, pulse (!) 45, temperature 98.3 F (36.8 C), temperature source Oral, resp. rate 16, height 5' 8.5" (1.74 m), weight 66.2 kg (146 lb), SpO2 100 %. Physical Exam  Vitals reviewed. Constitutional: He is oriented to person, place, and time. He appears well-developed and well-nourished.  HENT:  Head: Normocephalic and atraumatic.  Eyes: Pupils are equal, round, and reactive to light. Conjunctivae and EOM are normal.  Neck: Normal range of motion. Neck supple.  Cardiovascular: Normal rate and regular rhythm.  Respiratory: Effort normal and breath sounds normal.  GI: Soft. Bowel sounds are normal. He exhibits no distension. There is no tenderness.  Right inguinal hernia  Musculoskeletal: Normal range of motion.  Neurological: He is alert and oriented to person, place, and time.  Skin: Skin is warm and dry.  Psychiatric: He has a normal mood and affect. His behavior is normal.     Assessment/Plan 67 yo male with right inguinal hernia. -open right inguinal hernia with mesh -outpatient procedure  Mickeal Skinner, MD 07/03/2017, 1:43 PM

## 2017-07-03 NOTE — Discharge Instructions (Signed)

## 2017-07-04 ENCOUNTER — Encounter (HOSPITAL_BASED_OUTPATIENT_CLINIC_OR_DEPARTMENT_OTHER): Payer: Self-pay | Admitting: General Surgery

## 2017-07-12 ENCOUNTER — Other Ambulatory Visit: Payer: Self-pay | Admitting: Cardiovascular Disease

## 2017-07-12 DIAGNOSIS — E7800 Pure hypercholesterolemia, unspecified: Secondary | ICD-10-CM

## 2017-07-12 NOTE — Telephone Encounter (Signed)
Outpatient Medication Detail    Disp Refills Start End   simvastatin (ZOCOR) 40 MG tablet 90 tablet 3 06/26/2017    Sig - Route: Take 1 tablet (40 mg total) by mouth daily. - Oral   Patient taking differently: Take 40 mg by mouth every evening.        Sent to pharmacy as: simvastatin (ZOCOR) 40 MG tablet   E-Prescribing Status: Receipt confirmed by pharmacy (06/26/2017 11:59 AM EDT)   Associated Diagnoses   Elevated LDL cholesterol level     Pharmacy   [id number] - [place], [place]

## 2017-08-16 DIAGNOSIS — R310 Gross hematuria: Secondary | ICD-10-CM | POA: Diagnosis not present

## 2017-08-16 DIAGNOSIS — N50811 Right testicular pain: Secondary | ICD-10-CM | POA: Diagnosis not present

## 2017-08-16 DIAGNOSIS — R102 Pelvic and perineal pain: Secondary | ICD-10-CM | POA: Diagnosis not present

## 2017-08-16 DIAGNOSIS — N201 Calculus of ureter: Secondary | ICD-10-CM | POA: Diagnosis not present

## 2017-09-25 ENCOUNTER — Other Ambulatory Visit: Payer: Medicare Other

## 2017-09-26 ENCOUNTER — Other Ambulatory Visit: Payer: Medicare Other

## 2017-09-26 DIAGNOSIS — E7800 Pure hypercholesterolemia, unspecified: Secondary | ICD-10-CM | POA: Diagnosis not present

## 2017-09-26 LAB — LIPID PANEL
CHOL/HDL RATIO: 3.3 ratio (ref 0.0–5.0)
Cholesterol, Total: 163 mg/dL (ref 100–199)
HDL: 50 mg/dL (ref >39)
LDL Calculated: 95 mg/dL (ref 0–99)
Triglycerides: 90 mg/dL (ref 0–149)
VLDL Cholesterol Cal: 18 mg/dL (ref 5–40)

## 2017-09-26 LAB — HEPATIC FUNCTION PANEL
ALT: 18 IU/L (ref 0–44)
AST: 22 IU/L (ref 0–40)
Albumin: 4.5 g/dL (ref 3.6–4.8)
Alkaline Phosphatase: 50 IU/L (ref 39–117)
Bilirubin Total: 1.2 mg/dL (ref 0.0–1.2)
Bilirubin, Direct: 0.28 mg/dL (ref 0.00–0.40)
Total Protein: 6.7 g/dL (ref 6.0–8.5)

## 2017-09-28 ENCOUNTER — Other Ambulatory Visit: Payer: Medicare Other

## 2017-10-06 ENCOUNTER — Telehealth: Payer: Self-pay

## 2017-10-06 DIAGNOSIS — Z79899 Other long term (current) drug therapy: Secondary | ICD-10-CM

## 2017-10-06 DIAGNOSIS — E782 Mixed hyperlipidemia: Secondary | ICD-10-CM

## 2017-10-06 MED ORDER — ATORVASTATIN CALCIUM 40 MG PO TABS: 40.0000 mg | ORAL_TABLET | Freq: Every day | ORAL | 3 refills | Status: DC

## 2017-10-06 NOTE — Telephone Encounter (Signed)
-----   Message from Josue Hector, MD sent at 10/01/2017 10:07 PM EDT ----- Would change to generic lipitor 40 mg and repeat labs in 2 months to achieve target LDL under 70

## 2017-10-06 NOTE — Telephone Encounter (Signed)
Per Patient "I have checked with my pharmacist and it's okay to change my statin prescription to generic Lipitor (atorvastatin) up to 40mg .  Please send new Rx to [facility] [address]."  Will send in lipitor 40 mg by mouth daily. Will have patient come in for lab work in 2 months.

## 2017-10-20 ENCOUNTER — Telehealth: Payer: Self-pay | Admitting: Cardiovascular Disease

## 2017-10-20 NOTE — Telephone Encounter (Signed)
Retrieved a voice mail from the patient on 10/13/17.  I called him back and he explained he had a few concerns he wanted to be addressed.  He had a CT scan last December that has been denied for medical necessity.  He has appealed the claim 3 times and the third was an outside evaluation.  All three appeals were denied stating not medically necessary.  In addition to this the patient expressed concern that [name] missed addressing changes to his aortic root.  I explained I am not clinical and would need to refer this for investigation.  I told the patient I would give him a status updated on Wednesday, October 18, 2017.  On Wednesday, October 18, 2017 I left a message on the patient's answering machine as he indicated in our first conversation.  I advised it was still under investigation and I would provide another update on Friday, October 20, 2017.  Today, I called patient to advise he would be receiving a call from Marcellus Scott who is working on the investigation of his concerns.  He did not answer and I left this message on his answering machine.

## 2017-11-03 DIAGNOSIS — Z23 Encounter for immunization: Secondary | ICD-10-CM | POA: Diagnosis not present

## 2017-11-08 DIAGNOSIS — N401 Enlarged prostate with lower urinary tract symptoms: Secondary | ICD-10-CM | POA: Diagnosis not present

## 2017-11-08 DIAGNOSIS — R350 Frequency of micturition: Secondary | ICD-10-CM | POA: Diagnosis not present

## 2017-11-08 DIAGNOSIS — N201 Calculus of ureter: Secondary | ICD-10-CM | POA: Diagnosis not present

## 2017-11-13 ENCOUNTER — Other Ambulatory Visit: Payer: Self-pay

## 2017-11-13 DIAGNOSIS — R109 Unspecified abdominal pain: Secondary | ICD-10-CM | POA: Diagnosis not present

## 2017-11-13 DIAGNOSIS — Z79899 Other long term (current) drug therapy: Secondary | ICD-10-CM | POA: Diagnosis not present

## 2017-11-13 DIAGNOSIS — N200 Calculus of kidney: Secondary | ICD-10-CM | POA: Insufficient documentation

## 2017-11-13 DIAGNOSIS — Z7982 Long term (current) use of aspirin: Secondary | ICD-10-CM | POA: Diagnosis not present

## 2017-11-13 DIAGNOSIS — I259 Chronic ischemic heart disease, unspecified: Secondary | ICD-10-CM | POA: Diagnosis not present

## 2017-11-13 DIAGNOSIS — Z7902 Long term (current) use of antithrombotics/antiplatelets: Secondary | ICD-10-CM | POA: Diagnosis not present

## 2017-11-13 DIAGNOSIS — R1031 Right lower quadrant pain: Secondary | ICD-10-CM | POA: Diagnosis present

## 2017-11-13 DIAGNOSIS — N210 Calculus in bladder: Secondary | ICD-10-CM | POA: Diagnosis not present

## 2017-11-13 LAB — URINALYSIS, ROUTINE W REFLEX MICROSCOPIC
Bacteria, UA: NONE SEEN
Bilirubin Urine: NEGATIVE
Glucose, UA: NEGATIVE mg/dL
KETONES UR: NEGATIVE mg/dL
LEUKOCYTES UA: NEGATIVE
Nitrite: NEGATIVE
PH: 6 (ref 5.0–8.0)
PROTEIN: NEGATIVE mg/dL
Specific Gravity, Urine: 1.023 (ref 1.005–1.030)

## 2017-11-13 NOTE — ED Triage Notes (Signed)
Pt to ED by POV with c/o right sided pain 6/10. Pt was at the Urologist 2 weeks ago and CT scan showed two kidney stones above the bladder. Pt went back to urologist this past week and xray didn't show any. Pt has developed pain more today in his lower right side and groin.  Pt states he started feeling chills and fever today. Pt recently had a hernia repair and hx of UC and CAD.

## 2017-11-14 ENCOUNTER — Emergency Department (HOSPITAL_COMMUNITY)
Admission: EM | Admit: 2017-11-14 | Discharge: 2017-11-14 | Disposition: A | Discharge status: Home / Self Care | Payer: Medicare Other | Attending: Emergency Medicine | Admitting: Emergency Medicine

## 2017-11-14 ENCOUNTER — Emergency Department (HOSPITAL_COMMUNITY): Payer: Medicare Other

## 2017-11-14 DIAGNOSIS — N200 Calculus of kidney: Secondary | ICD-10-CM

## 2017-11-14 DIAGNOSIS — R109 Unspecified abdominal pain: Secondary | ICD-10-CM | POA: Diagnosis not present

## 2017-11-14 DIAGNOSIS — N210 Calculus in bladder: Secondary | ICD-10-CM | POA: Diagnosis not present

## 2017-11-14 LAB — BASIC METABOLIC PANEL
ANION GAP: 6 (ref 5–15)
BUN: 23 mg/dL (ref 8–23)
CHLORIDE: 107 mmol/L (ref 98–111)
CO2: 27 mmol/L (ref 22–32)
Calcium: 8.8 mg/dL — ABNORMAL LOW (ref 8.9–10.3)
Creatinine, Ser: 0.95 mg/dL (ref 0.61–1.24)
GFR calc non Af Amer: >60 mL/min (ref >60)
Glucose, Bld: 107 mg/dL — ABNORMAL HIGH (ref 70–99)
Potassium: 4.1 mmol/L (ref 3.5–5.1)
Sodium: 140 mmol/L (ref 135–145)

## 2017-11-14 LAB — CBC WITH DIFFERENTIAL/PLATELET
Abs Immature Granulocytes: 0.02 10*3/uL (ref 0.00–0.07)
BASOS ABS: 0 10*3/uL (ref 0.0–0.1)
Basophils Relative: 1 %
Eosinophils Absolute: 0 10*3/uL (ref 0.0–0.5)
Eosinophils Relative: 1 %
HEMATOCRIT: 43.5 % (ref 39.0–52.0)
HEMOGLOBIN: 14.3 g/dL (ref 13.0–17.0)
Immature Granulocytes: 0 %
LYMPHS ABS: 0.9 10*3/uL (ref 0.7–4.0)
LYMPHS PCT: 17 %
MCH: 32 pg (ref 26.0–34.0)
MCHC: 32.9 g/dL (ref 30.0–36.0)
MCV: 97.3 fL (ref 80.0–100.0)
Monocytes Absolute: 0.4 10*3/uL (ref 0.1–1.0)
Monocytes Relative: 8 %
NRBC: 0 % (ref 0.0–0.2)
Neutro Abs: 3.8 10*3/uL (ref 1.7–7.7)
Neutrophils Relative %: 73 %
Platelets: 141 10*3/uL — ABNORMAL LOW (ref 150–400)
RBC: 4.47 MIL/uL (ref 4.22–5.81)
RDW: 12.3 % (ref 11.5–15.5)
WBC: 5.2 10*3/uL (ref 4.0–10.5)

## 2017-11-14 NOTE — Discharge Instructions (Addendum)
Continue medications and treatment plan as previously prescribed/recommended by your urologist.  Return to the emergency department if you develop worsening pain, high fevers, or other new and concerning symptoms.

## 2017-11-14 NOTE — ED Provider Notes (Signed)
[facility] DEPT Provider Note   CSN: [id number] Arrival date & time: 11/13/17  2048     History   Chief Complaint Chief Complaint  Patient presents with  . Flank Pain  . Nephrolithiasis    HPI Joseph Booker is a 67 y.o. male.  Patient is a 67 year old male with past medical history of coronary artery disease with stent, thoracic a sending aortic aneurysm, and recently diagnosed renal calculi.  He presents today for evaluation of pain in his right flank.  He was found to have kidney stones 2 weeks ago and has been seen by Dr. Jeffie Pollock.  He was to have a repeat CT scan in 4 weeks, however today developed increased pain.  He felt fevered and took his temperature.  It was 100.4.  He then called the urology office and was told to come here for evaluation.  He denies diarrhea or vomiting.  The history is provided by the patient.  Flank Pain  This is a recurrent problem. The problem occurs constantly. The problem has been gradually improving. Nothing aggravates the symptoms. Nothing relieves the symptoms. He has tried nothing for the symptoms.    Past Medical History:  Diagnosis Date  . Allergic rhinitis   . Anxiety state, unspecified   . CAD (coronary artery disease)    [name] / nuclear 2005, excellent tolerance no scar or ischemia, EF 51% / nuclear, February, 2012, no scar or ischemia / catheterization March 26, 2010.. 10% in-stent restenosis, minimal other nonobstructive coronary disease, ejection fraction 55%., excellent result; ETT 9/13: ex 13:14, no CP, no ischemic ECG changes  . Glaucoma, left eye   . History of exercise intolerance 05/23/2013   cardiopulmonary exercise test-- normal functional capacity when compared to matched sedentary norms, borderline ventilatory limitation with the patient's exercise tidal volume reaching 90% of the measured inspiratory capacity ( pt reports no dyspnea), did not appear to be circulatory limitation,  normal CPX test  . Hyperlipidemia   . Nocturia   . Osteopenia   . Thoracic ascending aortic aneurysm [facility])    followed by [name]---  last CT 12-29-2016 ,  4.5cm  . Ulcerative colitis, universal ([place])    GI-- [name]--  dx 1990s  (per note mild Mayo Score 1)  . Wears glasses     Patient Active Problem List   Diagnosis Date Noted  . Right inguinal hernia 05/31/2017  . Cough 04/14/2017  . Hyperglycemia 02/01/2017  . Change in stool 09/27/2016  . Herpes zoster without complication 78/67/5449  . Glaucoma 08/21/2013  . Mitral regurgitation 05/23/2013  . Ejection fraction   . Shortness of breath 05/13/2013  . Dilated aortic root ([place]) 05/13/2013  . Pectus excavatum 05/13/2013  . Numbness and tingling in hands 05/13/2013  . BPH (benign prostatic hypertrophy) 07/06/2011  . CAD (coronary artery disease)   . Hyperlipidemia 06/12/2009  . VITAMIN D DEFICIENCY 06/04/2009  . ALLERGIC RHINITIS 06/04/2009  . GERD 06/04/2009  . ANXIETY STATE, UNSPECIFIED 05/15/2009  . Osteopenia 05/15/2009  . Ulcerative colitis ([place]) 05/17/2007    Past Surgical History:  Procedure Laterality Date  . CARDIAC CATHETERIZATION  03/26/2010    dr Dian Queen   single vessel CAD w/ patent mLAD stent w/ 10% in-stent restenosis w/ mild to moderate disease in the remainder LAD w/ no lesions that appearedto be flow-limitinf/  normal LVFSF, ef 55%  . CARDIOVASCULAR STRESS TEST  06-30-2016   [name]   Low risk nuclear study w/ no evidence ishcemia/  normal LV function and wall motion,  nuclear stress ef 55%  . COLONOSCOPY  last one 10-27-2016  . CORONARY ANGIOPLASTY WITH STENT PLACEMENT  07-25-2002   [name]   PTCA w/  DES x1 to mLAD  . INGUINAL HERNIA REPAIR Left 2006  . INGUINAL HERNIA REPAIR Right 07/03/2017   Procedure: OPEN REPAIR OF RIGHT INGUINAL HERNIA WITH MESH;  Surgeon: Kinsinger, Arta Bruce, MD;  Location: [facility];  Service: General;  Laterality: Right;  . INSERTION OF MESH Right  07/03/2017   Procedure: INSERTION OF MESH;  Surgeon: Kinsinger, Arta Bruce, MD;  Location: [facility];  Service: General;  Laterality: Right;  . SEPTOPLASTY  1987   w/ RHINOPLASTY  . TONSILLECTOMY  1959  . TRANSTHORACIC ECHOCARDIOGRAM  06-30-2016     [name]   ef 55-60%/  trivial AR/ mild dilated ascending aorta, 57m/  trivial MR/  mild PR and TR         Home Medications    Prior to Admission medications   Medication Sig Start Date End Date Taking? Authorizing Provider  aspirin 81 MG tablet Take 81 mg by mouth daily.      [provider]  atorvastatin (LIPITOR) 40 MG tablet Take 1 tablet (40 mg total) by mouth daily. 10/06/17   NJosue Hector MD  clopidogrel (PLAVIX) 75 MG tablet TAKE 1 TABLET (75 MG TOTAL) BY MOUTH DAILY. 07/12/17   NJosue Hector MD  Coenzyme Q10 (CO Q-10) 300 MG CAPS Take 1 tablet by mouth every morning.     [provider]  HYDROcodone-acetaminophen (NORCO/VICODIN) 5-325 MG tablet Take 1 tablet by mouth every 6 (six) hours as needed for moderate pain. 07/03/17   Kinsinger, LArta Bruce MD  ibuprofen (ADVIL,MOTRIN) 800 MG tablet Take 1 tablet (800 mg total) by mouth every 8 (eight) hours as needed. 07/03/17   Kinsinger, LArta Bruce MD  latanoprost (XALATAN) 0.005 % ophthalmic solution Place 1 drop into both eyes at bedtime.    [provider]  loratadine (CLARITIN) 10 MG tablet Take 10 mg by mouth as needed.     [provider]  mesalamine (APRISO) 0.375 g 24 hr capsule 1.5 mg daily  =  4 tablets once daily Patient taking differently: Take by mouth daily. 1.5 mg daily  =  4 tablets once daily 02/16/17   NMauri Pole MD  Multiple Vitamin (MULTIVITAMIN) tablet Take 1 tablet by mouth daily.    [provider]  timolol (TIMOPTIC) 0.5 % ophthalmic solution Place 1 drop into the left eye 2 (two) times daily.    [provider]    Family History Family History  Problem Relation Age of Onset  .  Diabetes Mother   . Heart disease Mother   . Lung disease Mother        ILD  . Rheum arthritis Mother   . Heart disease Father 567      MI  . Asthma Sister   . Colon cancer Neg Hx   . Esophageal cancer Neg Hx   . Pancreatic cancer Neg Hx   . Kidney disease Neg Hx   . Liver disease Neg Hx     Social History Social History   Tobacco Use  . Smoking status: Never Smoker  . Smokeless tobacco: Never Used  . Tobacco comment: Married, 1 stepson, 5 g-kids. 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 Alcohol use: Yes    Comment:  occasional wine  . Drug use: No     Allergies   Patient has no known allergies.   Review of Systems Review of Systems  Genitourinary: Positive for flank pain.  All other systems reviewed and are negative.    Physical Exam Updated Vital Signs BP 126/82 (BP Location: Left Arm)   Pulse 77   Temp 98.6 F (37 C)   Resp 18   Ht 5' 8"  (1.727 m)   Wt 66.2 kg   SpO2 96%   BMI 22.19 kg/m   Physical Exam  Constitutional: He is oriented to person, place, and time. He appears well-developed and well-nourished. No distress.  HENT:  Head: Normocephalic and atraumatic.  Mouth/Throat: Oropharynx is clear and moist.  Neck: Normal range of motion. Neck supple.  Cardiovascular: Normal rate and regular rhythm. Exam reveals no friction rub.  No murmur heard. Pulmonary/Chest: Effort normal and breath sounds normal. No respiratory distress. He has no wheezes. He has no rales.  Abdominal: Soft. Bowel sounds are normal. He exhibits no distension. There is tenderness.  There is mild right-sided CVA tenderness noted.  Musculoskeletal: Normal range of motion. He exhibits no edema.  Neurological: He is alert and oriented to person, place, and time. Coordination normal.  Skin: Skin is warm and dry. He is not diaphoretic.  Nursing note and vitals reviewed.    ED Treatments / Results  Labs (all labs ordered are listed, but only abnormal  results are displayed) Labs Reviewed  URINALYSIS, ROUTINE W REFLEX MICROSCOPIC - Abnormal; Notable for the following components:      Result Value   Hgb urine dipstick MODERATE (*)    All other components within normal limits  URINE CULTURE  BASIC METABOLIC PANEL  CBC WITH DIFFERENTIAL/PLATELET    EKG None  Radiology No results found.  Procedures Procedures (including critical care time)  Medications Ordered in ED Medications - No data to display   Initial Impression / Assessment and Plan / ED Course  I have reviewed the triage vital signs and the nursing notes.  Pertinent labs & imaging results that were available during my care of the patient were reviewed by me and considered in my medical decision making (see chart for details).  Patient with history of renal calculi presenting with complaints of recurrent flank pain.  He has been seen at [facility] urology and is to have repeat imaging in the next few weeks to confirm stone passage.  He developed an episode of flank pain this evening which has since improved.  He also felt fevered and stated that his temperature was 100.4.  He is afebrile here with no white count and no sign of infection in his urine.  Repeat CT scan shows what appears to be passage of the stones into the urinary bladder.  He now appears very comfortable.  He will be discharged, to follow-up with his urologist as needed.  Final Clinical Impressions(s) / ED Diagnoses   Final diagnoses:  None    ED Discharge Orders    None       Veryl Speak, MD 11/14/17 [phone]

## 2017-11-15 LAB — URINE CULTURE
CULTURE: NO GROWTH
Special Requests: NORMAL

## 2017-12-20 ENCOUNTER — Other Ambulatory Visit: Payer: Medicare Other

## 2017-12-20 ENCOUNTER — Other Ambulatory Visit: Payer: Self-pay

## 2017-12-20 DIAGNOSIS — Z79899 Other long term (current) drug therapy: Secondary | ICD-10-CM | POA: Diagnosis not present

## 2017-12-20 DIAGNOSIS — E782 Mixed hyperlipidemia: Secondary | ICD-10-CM

## 2017-12-20 DIAGNOSIS — R350 Frequency of micturition: Secondary | ICD-10-CM | POA: Diagnosis not present

## 2017-12-20 DIAGNOSIS — N210 Calculus in bladder: Secondary | ICD-10-CM | POA: Diagnosis not present

## 2017-12-20 DIAGNOSIS — R3121 Asymptomatic microscopic hematuria: Secondary | ICD-10-CM | POA: Diagnosis not present

## 2017-12-20 DIAGNOSIS — N401 Enlarged prostate with lower urinary tract symptoms: Secondary | ICD-10-CM | POA: Diagnosis not present

## 2017-12-20 LAB — HEPATIC FUNCTION PANEL
ALT: 34 IU/L (ref 0–44)
AST: 28 IU/L (ref 0–40)
Albumin: 4 g/dL (ref 3.6–4.8)
Alkaline Phosphatase: 64 IU/L (ref 39–117)
BILIRUBIN TOTAL: 0.6 mg/dL (ref 0.0–1.2)
Bilirubin, Direct: 0.16 mg/dL (ref 0.00–0.40)
Total Protein: 6.4 g/dL (ref 6.0–8.5)

## 2017-12-20 LAB — LIPID PANEL
CHOL/HDL RATIO: 3.1 ratio (ref 0.0–5.0)
CHOLESTEROL TOTAL: 152 mg/dL (ref 100–199)
HDL: 49 mg/dL (ref >39)
LDL CALC: 85 mg/dL (ref 0–99)
TRIGLYCERIDES: 89 mg/dL (ref 0–149)
VLDL Cholesterol Cal: 18 mg/dL (ref 5–40)

## 2017-12-21 DIAGNOSIS — H2512 Age-related nuclear cataract, left eye: Secondary | ICD-10-CM | POA: Diagnosis not present

## 2017-12-21 DIAGNOSIS — H25812 Combined forms of age-related cataract, left eye: Secondary | ICD-10-CM | POA: Diagnosis not present

## 2017-12-30 ENCOUNTER — Other Ambulatory Visit: Payer: Self-pay | Admitting: Cardiovascular Disease

## 2018-01-01 NOTE — Telephone Encounter (Signed)
Pt has an appt with Dr. Oval Linsey, new cardiologist. Please address

## 2018-01-04 DIAGNOSIS — H2511 Age-related nuclear cataract, right eye: Secondary | ICD-10-CM | POA: Diagnosis not present

## 2018-01-04 DIAGNOSIS — H25811 Combined forms of age-related cataract, right eye: Secondary | ICD-10-CM | POA: Diagnosis not present

## 2018-02-07 ENCOUNTER — Encounter: Payer: Self-pay | Admitting: Cardiovascular Disease

## 2018-02-07 ENCOUNTER — Ambulatory Visit (INDEPENDENT_AMBULATORY_CARE_PROVIDER_SITE_OTHER): Payer: Medicare Other | Admitting: Cardiovascular Disease

## 2018-02-07 VITALS — BP 136/80 | HR 51 | Ht 68.5 in | Wt 149.6 lb

## 2018-02-07 DIAGNOSIS — E782 Mixed hyperlipidemia: Secondary | ICD-10-CM | POA: Diagnosis not present

## 2018-02-07 DIAGNOSIS — I2510 Atherosclerotic heart disease of native coronary artery without angina pectoris: Secondary | ICD-10-CM

## 2018-02-07 DIAGNOSIS — Z79899 Other long term (current) drug therapy: Secondary | ICD-10-CM

## 2018-02-07 DIAGNOSIS — I712 Thoracic aortic aneurysm, without rupture: Secondary | ICD-10-CM

## 2018-02-07 DIAGNOSIS — I7121 Aneurysm of the ascending aorta, without rupture: Secondary | ICD-10-CM

## 2018-02-07 MED ORDER — ATORVASTATIN CALCIUM 80 MG PO TABS: 80.0000 mg | ORAL_TABLET | Freq: Every day | ORAL | 3 refills | Status: DC

## 2018-02-07 NOTE — Progress Notes (Signed)
Cardiology Office Note   Date:  02/07/2018   ID:  Joseph Booker, DOB 1951/01/15, MRN [id number]  PCP:  Binnie Rail, MD  Cardiologist:   Skeet Latch, MD   No chief complaint on file.     History of Present Illness: Joseph Booker is a 68 y.o. male with CAD and mild ascending aorta aneurysm here for follow up.  Joseph Booker had an LAD PCI in 2004. He had a [employer] 06/2016 that revealed LVEF 55% and no ischemia.  He last saw [name] 02/15/17 at which time he reported some non-exertional shortness of breath.  This has been an ongoing complaint for years.  He had a CPX in 2015 that was normal.  He saw an allergist who suggested that he might have inflammation in his lungs.  He was noted to have allergies to dogs and cats but does not take any allergy medications.  He exercises regularly.  In the past he walked 7 miles 7 days/week.  However lately he is only been walking 5 miles 3 days/week.  He cut back on his exercise because he is had several surgeries.  He denies any exertional chest pain or shortness of breath.  He has no lightheadedness or dizziness.  He denies lower extremity edema, orthopnea, or PND.  He has been working with [name] to get his cholesterol down.  He has a healthy diet and as above, he exercises regularly.  In the past he did not tolerate an unknown statin.  Most recently he has been on simvastatin and did not achieve goal.  This was switched to atorvastatin and it is been titrated to 40 mg.  His most recent LDL was 85.  He was instructed to increase atorvastatin to 80 mg alternating with 40 mg.  However he has not yet made this change.   Past Medical History:  Diagnosis Date  . Allergic rhinitis   . Anxiety state, unspecified   . CAD (coronary artery disease)    [name] / nuclear 2005, excellent tolerance no scar or ischemia, EF 51% / nuclear, February, 2012, no scar or ischemia / catheterization March 26, 2010.. 10% in-stent restenosis, minimal  other nonobstructive coronary disease, ejection fraction 55%., excellent result; ETT 9/13: ex 13:14, no CP, no ischemic ECG changes  . Glaucoma, left eye   . History of exercise intolerance 05/23/2013   cardiopulmonary exercise test-- normal functional capacity when compared to matched sedentary norms, borderline ventilatory limitation with the patient's exercise tidal volume reaching 90% of the measured inspiratory capacity ( pt reports no dyspnea), did not appear to be circulatory limitation, normal CPX test  . Hyperlipidemia   . Nocturia   . Osteopenia   . Thoracic ascending aortic aneurysm [facility])    followed by [name]---  last CT 12-29-2016 ,  4.5cm  . Ulcerative colitis, universal ([place])    GI-- [name]--  dx 1990s  (per note mild Mayo Score 1)  . Wears glasses     Past Surgical History:  Procedure Laterality Date  . CARDIAC CATHETERIZATION  03/26/2010    dr Dian Queen   single vessel CAD w/ patent mLAD stent w/ 10% in-stent restenosis w/ mild to moderate disease in the remainder LAD w/ no lesions that appearedto be flow-limitinf/  normal LVFSF, ef 55%  . CARDIOVASCULAR STRESS TEST  06-30-2016   [name]   Low risk nuclear study w/ no evidence ishcemia/  normal LV function and wall motion,  nuclear stress ef  55%  . COLONOSCOPY  last one 10-27-2016  . CORONARY ANGIOPLASTY WITH STENT PLACEMENT  07-25-2002   [name]   PTCA w/  DES x1 to mLAD  . INGUINAL HERNIA REPAIR Left 2006  . INGUINAL HERNIA REPAIR Right 07/03/2017   Procedure: OPEN REPAIR OF RIGHT INGUINAL HERNIA WITH MESH;  Surgeon: Kinsinger, Arta Bruce, MD;  Location: [facility];  Service: General;  Laterality: Right;  . INSERTION OF MESH Right 07/03/2017   Procedure: INSERTION OF MESH;  Surgeon: Kinsinger, Arta Bruce, MD;  Location: [facility];  Service: General;  Laterality: Right;  . SEPTOPLASTY  1987   w/ RHINOPLASTY  . TONSILLECTOMY  1959  . TRANSTHORACIC ECHOCARDIOGRAM  06-30-2016      [name]   ef 55-60%/  trivial AR/ mild dilated ascending aorta, 12m/  trivial MR/  mild PR and TR      Current Outpatient Medications  Medication Sig Dispense Refill  . aspirin 81 MG tablet Take 81 mg by mouth daily.      .Marland Kitchenatorvastatin (LIPITOR) 80 MG tablet Take 1 tablet (80 mg total) by mouth daily. 90 tablet 3  . clopidogrel (PLAVIX) 75 MG tablet TAKE 1 TABLET (75 MG TOTAL) BY MOUTH DAILY. 90 tablet 0  . Coenzyme Q10 (CO Q-10) 300 MG CAPS Take 1 tablet by mouth every morning.     . mesalamine (APRISO) 0.375 g 24 hr capsule 1.5 mg daily  =  4 tablets once daily (Patient taking differently: Take by mouth daily. 1.5 mg daily  =  4 tablets once daily) 120 capsule 6  . Multiple Vitamin (MULTIVITAMIN) tablet Take 1 tablet by mouth daily.    . timolol (TIMOPTIC) 0.5 % ophthalmic solution Place 1 drop into the left eye 2 (two) times daily.     No current facility-administered medications for this visit.     Allergies:   Patient has no known allergies.    Social History:  The patient  reports that he has never smoked. He has never used smokeless tobacco. He reports current alcohol use. He reports that he does not use drugs.   Family History:  The patient's family history includes Asthma in his sister; CAD in his mother; Diabetes in his mother; Heart attack in his father; Heart disease in his mother; Heart disease (age of onset: 529 in his father; Lung disease in his mother; Rheum arthritis in his mother.    ROS:  Please see the history of present illness.   Otherwise, review of systems are positive for none .   All other systems are reviewed and negative.    PHYSICAL EXAM: VS:  BP 136/80   Pulse (!) 51   Ht 5' 8.5" (1.74 m)   Wt 149 lb 9.6 oz (67.9 kg)   BMI 22.42 kg/m  , BMI Body mass index is 22.42 kg/m. GENERAL:  Well appearing HEENT: Pupils equal round and reactive, fundi not visualized, oral mucosa unremarkable NECK:  No jugular venous distention, waveform within normal  limits, carotid upstroke brisk and symmetric, no bruits, no thyromegaly LYMPHATICS:  No cervical adenopathy LUNGS:  Clear to auscultation bilaterally HEART: Bradycardic.  Regular rhythm.  PMI not displaced or sustained,S1 and S2 within normal limits, no S3, no S4, no clicks, no rubs, no murmurs ABD:  Flat, positive bowel sounds normal in frequency in pitch, no bruits, no rebound, no guarding, no midline pulsatile mass, no hepatomegaly, no splenomegaly EXT:  2 plus pulses throughout, no edema, no  cyanosis no clubbing SKIN:  No rashes no nodules NEURO:  Cranial nerves II through XII grossly intact, motor grossly intact throughout PSYCH:  Cognitively intact, oriented to person place and time   EKG:  EKG is ordered today. The ekg ordered today demonstrates sinus bradycardia.  Rate 51 bpm.  Coronary CT-A 12/29/16:  Ascending aneurysm 4.5 cm. <50% LM stenosis.  Patent proximal LAD stent.   Lexiscan Myoview 06/2016:  Nuclear stress EF: 55%.  Blood pressure demonstrated a hypertensive response to exercise.  There was no ST segment deviation noted during stress.  The study is normal.  This is a low risk study.  The left ventricular ejection fraction is normal (55-65%).   Normal resting and stress perfusion. No ischemia or infarction EF 55%  Echo 06/30/16:  Nuclear stress EF: 55%.  Blood pressure demonstrated a hypertensive response to exercise.  There was no ST segment deviation noted during stress.  The study is normal.  This is a low risk study.  The left ventricular ejection fraction is normal (55-65%).   Normal resting and stress perfusion. No ischemia or infarction EF 55%  CPX 05/23/13: Normal   Recent Labs: 11/14/2017: BUN 23; Creatinine, Ser 0.95; Hemoglobin 14.3; Platelets 141; Potassium 4.1; Sodium 140 12/20/2017: ALT 34    Lipid Panel    Component Value Date/Time   CHOL 152 12/20/2017 0911   TRIG 89 12/20/2017 0911   TRIG 46 12/19/2008   HDL 49 12/20/2017  0911   CHOLHDL 3.1 12/20/2017 0911   CHOLHDL 4 03/25/2016 0914   VLDL 18.2 03/25/2016 0914   LDLCALC 85 12/20/2017 0911      Wt Readings from Last 3 Encounters:  02/07/18 149 lb 9.6 oz (67.9 kg)  11/13/17 145 lb 15.1 oz (66.2 kg)  07/03/17 146 lb (66.2 kg)      ASSESSMENT AND PLAN:  # Ascending aorta aneurysm: 4.5cm on chest CT-A 12/2016.  He had trouble with his insurance approving a CT scan in the past.  We will get an echocardiogram to assess his aneurysm.  #CAD: # Hyperlipidemia: Mr. Dillenburg had a stent in the LAD in 2004.  He is doing well clinically and has no angina.  He has noncardiac chest pain.  He is not on a beta-blocker due to bradycardia.  Continue aspirin and clopidogrel.  Increase atorvastatin to 80 mg.  If he does not tolerate this or his LDL is not less than 70 then we will send him to the lipid clinic for a PCSK9 inhibitor.  # Shortness of breath: May be allergy related.  He has dogs and a known dog allergy.  I suggested that he try an OTC allergy pill for a month or two and see if it helps.   # Elevated BP: He reports that it is always well-controlled when he checks it at home or in a pharmacy.  He has some whitecoat hypertension.  I have encouraged him to continue checking it and notify us if his blood pressure is running greater than 130/80.   Current medicines are reviewed at length with the patient today.  The patient does not have concerns regarding medicines.  The following changes have been made:  no change  Labs/ tests ordered today include:   Orders Placed This Encounter  Procedures  . Lipid panel  . Comprehensive metabolic panel  . EKG 12-Lead  . ECHOCARDIOGRAM COMPLETE     Disposition:   FU with Sondi Desch C. Oval Linsey, MD, [facility] in 1 year.  Signed, Asiel Chrostowski C. Oval Linsey, MD, [facility]  02/07/2018 10:16 AM    [place]

## 2018-02-07 NOTE — Patient Instructions (Addendum)
Medication Instructions:  INCREASE YOUR ATORVASTATIN TO 80 MG DAILY   If you need a refill on your cardiac medications before your next appointment, please call your pharmacy.   Lab work: FASTING LP/CMET IN 6-8 WEEKS   If you have labs (blood work) drawn today and your tests are completely normal, you will receive your results only by: Marland Kitchen MyChart Message (if you have MyChart) OR . A paper copy in the mail If you have any lab test that is abnormal or we need to change your treatment, we will call you to review the results.  Testing/Procedures: Your physician has requested that you have an echocardiogram. Echocardiography is a painless test that uses sound waves to create images of your heart. It provides your doctor with information about the size and shape of your heart and how well your heart's chambers and valves are working. This procedure takes approximately one hour. There are no restrictions for this procedure. [place] [address]  Follow-Up: At [facility], you and your health needs are our priority.  As part of our continuing mission to provide you with exceptional heart care, we have created designated Provider Care Teams.  These Care Teams include your primary Cardiologist (physician) and Advanced Practice Providers (APPs -  Physician Assistants and Nurse Practitioners) who all work together to provide you with the care you need, when you need it. You will need a follow up appointment in 12 MONTHS.  Please call our office 2 months in advance to schedule this appointment.  You may see DR [facility] or one of the following Advanced Practice Providers on your designated Care Team:   Kerin Ransom, PA-C Roby Lofts, [place] . Sande Rives, PA-C  Any Other Special Instructions Will Be Listed Below (If Applicable).  Echocardiogram An echocardiogram is a procedure that uses painless sound waves (ultrasound) to produce an image of the heart. Images from an  echocardiogram can provide important information about:  Signs of coronary artery disease (CAD).  Aneurysm detection. An aneurysm is a weak or damaged part of an artery wall that bulges out from the normal force of blood pumping through the body.  Heart size and shape. Changes in the size or shape of the heart can be associated with certain conditions, including heart failure, aneurysm, and CAD.  Heart muscle function.  Heart valve function.  Signs of a past heart attack.  Fluid buildup around the heart.  Thickening of the heart muscle.  A tumor or infectious growth around the heart valves. Tell a health care provider about:  Any allergies you have.  All medicines you are taking, including vitamins, herbs, eye drops, creams, and over-the-counter medicines.  Any blood disorders you have.  Any surgeries you have had.  Any medical conditions you have.  Whether you are pregnant or may be pregnant. What are the risks? Generally, this is a safe procedure. However, problems may occur, including:  Allergic reaction to dye (contrast) that may be used during the procedure. What happens before the procedure? No specific preparation is needed. You may eat and drink normally. What happens during the procedure?   An IV tube may be inserted into one of your veins.  You may receive contrast through this tube. A contrast is an injection that improves the quality of the pictures from your heart.  A gel will be applied to your chest.  A wand-like tool (transducer) will be moved over your chest. The gel will help to transmit  the sound waves from the transducer.  The sound waves will harmlessly bounce off of your heart to allow the heart images to be captured in real-time motion. The images will be recorded on a computer. The procedure may vary among health care providers and hospitals. What happens after the procedure?  You may return to your normal, everyday life, including diet,  activities, and medicines, unless your health care provider tells you not to do that. Summary  An echocardiogram is a procedure that uses painless sound waves (ultrasound) to produce an image of the heart.  Images from an echocardiogram can provide important information about the size and shape of your heart, heart muscle function, heart valve function, and fluid buildup around your heart.  You do not need to do anything to prepare before this procedure. You may eat and drink normally.  After the echocardiogram is completed, you may return to your normal, everyday life, unless your health care provider tells you not to do that. This information is not intended to replace advice given to you by your health care provider. Make sure you discuss any questions you have with your health care provider. Document Released: 01/01/2000 Document Revised: 02/06/2016 Document Reviewed: 02/06/2016 Elsevier Interactive Patient Education  2019 [employer].

## 2018-02-14 ENCOUNTER — Ambulatory Visit (HOSPITAL_COMMUNITY): Payer: Medicare Other | Attending: Cardiology

## 2018-02-14 DIAGNOSIS — I712 Thoracic aortic aneurysm, without rupture: Secondary | ICD-10-CM | POA: Insufficient documentation

## 2018-02-14 DIAGNOSIS — I7121 Aneurysm of the ascending aorta, without rupture: Secondary | ICD-10-CM

## 2018-02-27 ENCOUNTER — Encounter: Payer: Self-pay | Admitting: *Deleted

## 2018-02-27 ENCOUNTER — Telehealth: Payer: Self-pay | Admitting: Cardiovascular Disease

## 2018-02-27 DIAGNOSIS — I340 Nonrheumatic mitral (valve) insufficiency: Secondary | ICD-10-CM

## 2018-02-27 DIAGNOSIS — I7121 Aneurysm of the ascending aorta, without rupture: Secondary | ICD-10-CM

## 2018-02-27 DIAGNOSIS — I712 Thoracic aortic aneurysm, without rupture: Secondary | ICD-10-CM

## 2018-02-27 NOTE — Telephone Encounter (Signed)
This encounter was created in error - please disregard.

## 2018-02-27 NOTE — Telephone Encounter (Signed)
Released in my chart with Dr Blenda Mounts comments attached and to call if any questions

## 2018-02-27 NOTE — Telephone Encounter (Signed)
Order for echo and recall placed in Epic

## 2018-02-27 NOTE — Telephone Encounter (Signed)
Patient return called for Echo results.  He states a detailed VM can be left or a message can be sent via [place].

## 2018-02-27 NOTE — Addendum Note (Signed)
Addended by: Alvina Filbert B on: 02/27/2018 06:40 PM   Modules accepted: Orders

## 2018-03-27 ENCOUNTER — Other Ambulatory Visit: Payer: Self-pay | Admitting: Cardiovascular Disease

## 2018-04-10 ENCOUNTER — Encounter: Payer: Self-pay | Admitting: Internal Medicine

## 2018-05-03 IMAGING — NM NM MISC PROCEDURE
6 series · 36 of 36 positions shown · non-contrast
Comparison: none

[Series 1: wbr_r-proj_st rest · 6.51mm/px · 6 of 64 frames shown]
[frame 6/64]
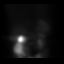
[frame 16/64]
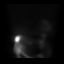
[frame 27/64]
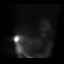
[frame 38/64]
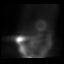
[frame 48/64]
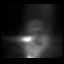
[frame 59/64]
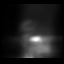

[Series 1: rest · 6.51mm/px · 6 of 64 frames shown]
[frame 6/64]
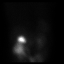
[frame 16/64]
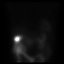
[frame 27/64]
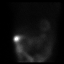
[frame 38/64]
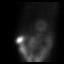
[frame 48/64]
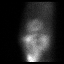
[frame 59/64]
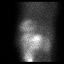

[Series 2: wbr_s-proj_st stress · 6.51mm/px · 6 of 512 frames shown (1 of 2)]
[frame 43/512]
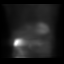
[frame 128/512]
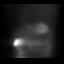
[frame 214/512]
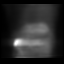
[frame 299/512]
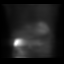
[frame 384/512]
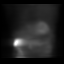
[frame 470/512]
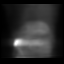

[Series 2: stress · 6.51mm/px · 6 of 512 frames shown (1 of 2)]
[frame 43/512]
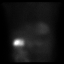
[frame 128/512]
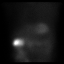
[frame 214/512]
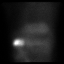
[frame 299/512]
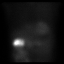
[frame 384/512]
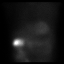
[frame 470/512]
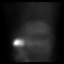

[Series 2: stress · 6.51mm/px · 6 of 64 frames shown (2 of 2)]
[frame 6/64]
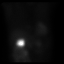
[frame 16/64]
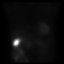
[frame 27/64]
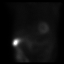
[frame 38/64]
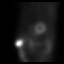
[frame 48/64]
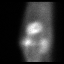
[frame 59/64]
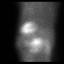

[Series 2: wbr_s-proj_st stress · 6.51mm/px · 6 of 64 frames shown (2 of 2)]
[frame 6/64]
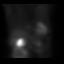
[frame 16/64]
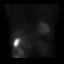
[frame 27/64]
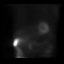
[frame 38/64]
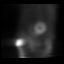
[frame 48/64]
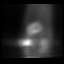
[frame 59/64]
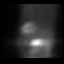

[36 of 36 positions shown; findings below may reference images not displayed]

Canned report from images found in remote index.

Refer to host system for actual result text.

## 2018-06-20 DIAGNOSIS — N401 Enlarged prostate with lower urinary tract symptoms: Secondary | ICD-10-CM | POA: Diagnosis not present

## 2018-06-20 DIAGNOSIS — N210 Calculus in bladder: Secondary | ICD-10-CM | POA: Diagnosis not present

## 2018-06-20 DIAGNOSIS — R3912 Poor urinary stream: Secondary | ICD-10-CM | POA: Diagnosis not present

## 2018-06-20 DIAGNOSIS — R3121 Asymptomatic microscopic hematuria: Secondary | ICD-10-CM | POA: Diagnosis not present

## 2018-06-25 DIAGNOSIS — H401122 Primary open-angle glaucoma, left eye, moderate stage: Secondary | ICD-10-CM | POA: Diagnosis not present

## 2018-07-05 DIAGNOSIS — J3089 Other allergic rhinitis: Secondary | ICD-10-CM | POA: Diagnosis not present

## 2018-07-05 DIAGNOSIS — H9313 Tinnitus, bilateral: Secondary | ICD-10-CM | POA: Diagnosis not present

## 2018-07-05 DIAGNOSIS — H903 Sensorineural hearing loss, bilateral: Secondary | ICD-10-CM | POA: Diagnosis not present

## 2018-07-08 ENCOUNTER — Other Ambulatory Visit: Payer: Self-pay | Admitting: Gastroenterology

## 2018-08-17 DIAGNOSIS — H2513 Age-related nuclear cataract, bilateral: Secondary | ICD-10-CM | POA: Diagnosis not present

## 2018-08-20 ENCOUNTER — Other Ambulatory Visit: Payer: Self-pay

## 2018-08-22 DIAGNOSIS — H169 Unspecified keratitis: Secondary | ICD-10-CM | POA: Diagnosis not present

## 2018-09-12 DIAGNOSIS — H169 Unspecified keratitis: Secondary | ICD-10-CM | POA: Diagnosis not present

## 2018-09-24 ENCOUNTER — Encounter: Payer: Self-pay | Admitting: Gastroenterology

## 2018-10-02 DIAGNOSIS — Z23 Encounter for immunization: Secondary | ICD-10-CM | POA: Diagnosis not present

## 2018-11-21 DIAGNOSIS — H10401 Unspecified chronic conjunctivitis, right eye: Secondary | ICD-10-CM | POA: Diagnosis not present

## 2018-11-28 ENCOUNTER — Telehealth: Payer: Self-pay | Admitting: *Deleted

## 2018-11-28 DIAGNOSIS — E7800 Pure hypercholesterolemia, unspecified: Secondary | ICD-10-CM

## 2018-11-28 DIAGNOSIS — M6281 Muscle weakness (generalized): Secondary | ICD-10-CM

## 2018-11-28 DIAGNOSIS — Z5181 Encounter for therapeutic drug level monitoring: Secondary | ICD-10-CM

## 2018-11-28 DIAGNOSIS — R739 Hyperglycemia, unspecified: Secondary | ICD-10-CM

## 2018-11-28 NOTE — Telephone Encounter (Signed)
Will forward to Dr Oval Linsey for review    From  Joseph Booker To  Skeet Latch, MD Sent  11/28/2018 4:43 PM  I do have some difficulty with weak muscles.    Also, my fasting blood sugar is 107, which I believe puts me in a pre-diabetes category.   Previous Messages   RE: Non-Urgent Medical Question  From  Earvin Hansen, LPN To  Buron Marinoff Sent and Delivered  11/28/2018 4:00 PM  Last Read in MyChart  11/28/2018 4:41 PM by Joseph Booker  Hello Mr Bechard, I am sorry I am not sure what happen with the previous message. Lipid, liver, electrolytes, kidney function, and blood sugar were what she had wanted for you to have. A1C can be ordered but you not having diabetes or elevated A1C it might be hard for insurance to cover. I can try fatigue but do not know. Are you having any muscle aches/pain that would be concerning to order the CPK lab test?  Rip Harbour    Previous Messages   Non-Urgent Medical Question  From  Joseph Booker To  Skeet Latch, MD Sent  11/28/2018 3:19 PM  Hello,  I sent a msg about a week ago asking if I could get a few blood tests ordered before I come in for my next office visit, but I haven't received an answer yet.  Did I miss something that you sent or do you need me to call the office?    Lipid panel, A1C, whatever test is necessary to rule out muscle problems with the high-dose of statin that I take.  Liver function test? Any other recommended blood tests?   Many thanks, Fonzo Combest

## 2018-12-04 NOTE — Telephone Encounter (Signed)
Yes, oK to order.

## 2018-12-06 NOTE — Telephone Encounter (Signed)
Discussed with Dr Oval Linsey and since patient is having muscle weakness ok to get CK, A1c, LP, and CMET. Labs ordered.  mychar message sent to paitent

## 2018-12-10 DIAGNOSIS — E7800 Pure hypercholesterolemia, unspecified: Secondary | ICD-10-CM | POA: Diagnosis not present

## 2018-12-10 DIAGNOSIS — Z5181 Encounter for therapeutic drug level monitoring: Secondary | ICD-10-CM | POA: Diagnosis not present

## 2018-12-10 DIAGNOSIS — R739 Hyperglycemia, unspecified: Secondary | ICD-10-CM | POA: Diagnosis not present

## 2018-12-10 DIAGNOSIS — M6281 Muscle weakness (generalized): Secondary | ICD-10-CM | POA: Diagnosis not present

## 2018-12-11 LAB — CK: Total CK: 140 U/L (ref 41–331)

## 2018-12-11 LAB — COMPREHENSIVE METABOLIC PANEL
ALT: 73 IU/L — ABNORMAL HIGH (ref 0–44)
AST: 64 IU/L — ABNORMAL HIGH (ref 0–40)
Albumin/Globulin Ratio: 1.9 (ref 1.2–2.2)
Albumin: 4.3 g/dL (ref 3.8–4.8)
Alkaline Phosphatase: 63 IU/L (ref 39–117)
BUN/Creatinine Ratio: 16 (ref 10–24)
BUN: 17 mg/dL (ref 8–27)
Bilirubin Total: 0.9 mg/dL (ref 0.0–1.2)
CO2: 24 mmol/L (ref 20–29)
Calcium: 9.3 mg/dL (ref 8.6–10.2)
Chloride: 104 mmol/L (ref 96–106)
Creatinine, Ser: 1.07 mg/dL (ref 0.76–1.27)
GFR calc Af Amer: 83 mL/min/{1.73_m2} (ref >59)
GFR calc non Af Amer: 72 mL/min/{1.73_m2} (ref >59)
Globulin, Total: 2.3 g/dL (ref 1.5–4.5)
Glucose: 93 mg/dL (ref 65–99)
Potassium: 4.1 mmol/L (ref 3.5–5.2)
Sodium: 141 mmol/L (ref 134–144)
Total Protein: 6.6 g/dL (ref 6.0–8.5)

## 2018-12-11 LAB — LIPID PANEL
Chol/HDL Ratio: 2.7 ratio (ref 0.0–5.0)
Cholesterol, Total: 136 mg/dL (ref 100–199)
HDL: 51 mg/dL (ref >39)
LDL Chol Calc (NIH): 70 mg/dL (ref 0–99)
Triglycerides: 78 mg/dL (ref 0–149)
VLDL Cholesterol Cal: 15 mg/dL (ref 5–40)

## 2018-12-11 LAB — HEMOGLOBIN A1C
Est. average glucose Bld gHb Est-mCnc: 111 mg/dL
Hgb A1c MFr Bld: 5.5 % (ref 4.8–5.6)

## 2018-12-12 ENCOUNTER — Other Ambulatory Visit: Payer: Self-pay

## 2018-12-20 ENCOUNTER — Telehealth: Payer: Self-pay | Admitting: *Deleted

## 2018-12-20 DIAGNOSIS — R7989 Other specified abnormal findings of blood chemistry: Secondary | ICD-10-CM

## 2018-12-20 NOTE — Telephone Encounter (Signed)
Per mychart message patient has been taking Valacyclovir for about 1 month.Dr Oval Linsey suggested stopping statin, LFT'S 2 weeks, and discuss options at follow up visit. Message sent to patient via mychart, patient read

## 2018-12-20 NOTE — Telephone Encounter (Signed)
-----   Message from Skeet Latch, MD sent at 12/18/2018  1:31 PM EST ----- Cholesterol is excellent.  Normal kidney function and electrolytes.  Liver function is slightly elevated.  This may be because of the higher dose of atorvastatin.  Stop it for two weeks then repeat LFTs.  Please have him f/u with [facility] lipid clinic to consider restarting vs trying a new med in a month.

## 2019-01-04 LAB — HEPATIC FUNCTION PANEL
ALT: 40 IU/L (ref 0–44)
AST: 40 IU/L (ref 0–40)
Albumin: 4.1 g/dL (ref 3.8–4.8)
Alkaline Phosphatase: 56 IU/L (ref 39–117)
Bilirubin Total: 1 mg/dL (ref 0.0–1.2)
Bilirubin, Direct: 0.24 mg/dL (ref 0.00–0.40)
Total Protein: 6.6 g/dL (ref 6.0–8.5)

## 2019-01-21 DIAGNOSIS — Z20828 Contact with and (suspected) exposure to other viral communicable diseases: Secondary | ICD-10-CM | POA: Diagnosis not present

## 2019-02-01 DIAGNOSIS — H26492 Other secondary cataract, left eye: Secondary | ICD-10-CM | POA: Diagnosis not present

## 2019-02-01 DIAGNOSIS — Z961 Presence of intraocular lens: Secondary | ICD-10-CM | POA: Diagnosis not present

## 2019-02-01 DIAGNOSIS — H401122 Primary open-angle glaucoma, left eye, moderate stage: Secondary | ICD-10-CM | POA: Diagnosis not present

## 2019-02-08 ENCOUNTER — Telehealth: Payer: Self-pay | Admitting: *Deleted

## 2019-02-08 ENCOUNTER — Encounter: Payer: Self-pay | Admitting: *Deleted

## 2019-02-08 NOTE — Telephone Encounter (Signed)
Left message to call back, appointment 1/28 needs to be changed to virtual visit or rescheduled for another day  Mychart message sent as well

## 2019-02-08 NOTE — Telephone Encounter (Signed)
Patient called, ok with virtual visit per scheduling. Visit changed

## 2019-02-14 ENCOUNTER — Other Ambulatory Visit: Payer: Self-pay | Admitting: Cardiovascular Disease

## 2019-02-14 ENCOUNTER — Encounter: Payer: Self-pay | Admitting: Cardiovascular Disease

## 2019-02-14 ENCOUNTER — Telehealth (INDEPENDENT_AMBULATORY_CARE_PROVIDER_SITE_OTHER): Payer: Medicare Other | Admitting: Cardiovascular Disease

## 2019-02-14 VITALS — BP 123/80 | HR 51 | Ht 68.5 in | Wt 145.0 lb

## 2019-02-14 DIAGNOSIS — Z5181 Encounter for therapeutic drug level monitoring: Secondary | ICD-10-CM

## 2019-02-14 DIAGNOSIS — I2510 Atherosclerotic heart disease of native coronary artery without angina pectoris: Secondary | ICD-10-CM | POA: Diagnosis not present

## 2019-02-14 DIAGNOSIS — I712 Thoracic aortic aneurysm, without rupture: Secondary | ICD-10-CM | POA: Diagnosis not present

## 2019-02-14 DIAGNOSIS — E782 Mixed hyperlipidemia: Secondary | ICD-10-CM | POA: Diagnosis not present

## 2019-02-14 DIAGNOSIS — I7121 Aneurysm of the ascending aorta, without rupture: Secondary | ICD-10-CM

## 2019-02-14 MED ORDER — ATORVASTATIN CALCIUM 80 MG PO TABS: 80.0000 mg | ORAL_TABLET | Freq: Every day | ORAL | 3 refills | Status: DC

## 2019-02-14 NOTE — Patient Instructions (Signed)
Medication Instructions:  START ATORVASTATIN 80 MG DAILY   *If you need a refill on your cardiac medications before your next appointment, please call your pharmacy*  Lab Work: FASTING LP/CMET IN 6 WEEKS  If you have labs (blood work) drawn today and your tests are completely normal, you will receive your results only by: Marland Kitchen MyChart Message (if you have MyChart) OR . A paper copy in the mail If you have any lab test that is abnormal or we need to change your treatment, we will call you to review the results.  Testing/Procedures: Your physician has requested that you have an echocardiogram. Echocardiography is a painless test that uses sound waves to create images of your heart. It provides your doctor with information about the size and shape of your heart and how well your heart's chambers and valves are working. This procedure takes approximately one hour. There are no restrictions for this procedure. [facility] [place] [address] CALL [phone] WHEN YOU ARE READY TO SCHEDULE   Follow-Up: At [facility], you and your health needs are our priority.  As part of our continuing mission to provide you with exceptional heart care, we have created designated Provider Care Teams.  These Care Teams include your primary Cardiologist (physician) and Advanced Practice Providers (APPs -  Physician Assistants and Nurse Practitioners) who all work together to provide you with the care you need, when you need it.  Your next appointment:   6 month(s) You will receive a reminder letter in the mail two months in advance. If you don't receive a letter, please call our office to schedule the follow-up appointment.   The format for your next appointment:   In Person  Provider:   You may see DR [facility] or one of the following Advanced Practice Providers on your designated Care Team:    Kerin Ransom, PA-C  [place], [place]  Coletta Memos, [place]

## 2019-02-14 NOTE — Progress Notes (Signed)
Virtual Visit via Video Note   This visit type was conducted due to national recommendations for restrictions regarding the COVID-19 Pandemic (e.g. social distancing) in an effort to limit this patient's exposure and mitigate transmission in our community.  Due to his co-morbid illnesses, this patient is at least at moderate risk for complications without adequate follow up.  This format is felt to be most appropriate for this patient at this time.  All issues noted in this document were discussed and addressed.  A limited physical exam was performed with this format.  Please refer to the patient's chart for his consent to telehealth for [name].   Date:  02/14/2019   ID:  Joseph Booker, DOB 1950-12-09, MRN [id number]  Patient Location: Home Provider Location: Home  PCP:  Binnie Rail, MD  Cardiologist:  Jenkins Rouge, MD  Electrophysiologist:  None   Evaluation Performed:  Follow-Up Visit  Chief Complaint:  Hyperlipidemia, shortness of breath  History of Present Illness:    Joseph Booker is a 69 y.o. male with CAD, hyperlipidemia, and mild ascending aorta aneurysm here for follow up.  [name] had an LAD PCI in 2004. He had a [employer] 06/2016 that revealed LVEF 55% and no ischemia.  He last saw [name] 02/15/17 at which time he reported some non-exertional shortness of breath.  This has been an ongoing complaint for years.  He had a CPX in 2015 that was normal.  He saw an allergist who suggested that he might have inflammation in his lungs.  He was noted to have allergies to dogs and cats but does not take any allergy medications.  He continues to exercise regularly.  He walks up to 6 miles daily and has no exertional chest pain.  His shortness of breath is stable.  He denies lower extremity edema, orthopnea, or PND.  His cholesterol is not well controlled on simvastatin so this was switched to atorvastatin.  However his LDL remained above 70 at 40 mg.  It was increased to 80 mg  but he developed transaminitis.  It was unclear whether this was due to the atorvastatin or the fact that he also started taking valacyclovir at the time.  His LFTs subsequently normalized.  Lately he has been feeling well.  He does still have some shortness of breath that is stable.  His blood pressure has been in the 120s and rarely rarely in the low 130s.  He continues to follow a healthy diet.   The patient does not have symptoms concerning for COVID-19 infection (fever, chills, cough, or new shortness of breath).    Past Medical History:  Diagnosis Date  . Allergic rhinitis   . Anxiety state, unspecified   . CAD (coronary artery disease)    [name] / nuclear 2005, excellent tolerance no scar or ischemia, EF 51% / nuclear, February, 2012, no scar or ischemia / catheterization March 26, 2010.. 10% in-stent restenosis, minimal other nonobstructive coronary disease, ejection fraction 55%., excellent result; ETT 9/13: ex 13:14, no CP, no ischemic ECG changes  . Glaucoma, left eye   . History of exercise intolerance 05/23/2013   cardiopulmonary exercise test-- normal functional capacity when compared to matched sedentary norms, borderline ventilatory limitation with the patient's exercise tidal volume reaching 90% of the measured inspiratory capacity ( pt reports no dyspnea), did not appear to be circulatory limitation, normal CPX test  . Hyperlipidemia   . Nocturia   . Osteopenia   . Thoracic ascending aortic  aneurysm [facility])    followed by [name]---  last CT 12-29-2016 ,  4.5cm  . Ulcerative colitis, universal ([place])    GI-- [name]--  dx 1990s  (per note mild Mayo Score 1)  . Wears glasses    Past Surgical History:  Procedure Laterality Date  . CARDIAC CATHETERIZATION  03/26/2010    dr Dian Queen   single vessel CAD w/ patent mLAD stent w/ 10% in-stent restenosis w/ mild to moderate disease in the remainder LAD w/ no lesions that appearedto be flow-limitinf/  normal LVFSF,  ef 55%  . CARDIOVASCULAR STRESS TEST  06-30-2016   [name]   Low risk nuclear study w/ no evidence ishcemia/  normal LV function and wall motion,  nuclear stress ef 55%  . COLONOSCOPY  last one 10-27-2016  . CORONARY ANGIOPLASTY WITH STENT PLACEMENT  07-25-2002   [name]   PTCA w/  DES x1 to mLAD  . INGUINAL HERNIA REPAIR Left 2006  . INGUINAL HERNIA REPAIR Right 07/03/2017   Procedure: OPEN REPAIR OF RIGHT INGUINAL HERNIA WITH MESH;  Surgeon: Kinsinger, Arta Bruce, MD;  Location: [facility];  Service: General;  Laterality: Right;  . INSERTION OF MESH Right 07/03/2017   Procedure: INSERTION OF MESH;  Surgeon: Kinsinger, Arta Bruce, MD;  Location: [facility];  Service: General;  Laterality: Right;  . SEPTOPLASTY  1987   w/ RHINOPLASTY  . TONSILLECTOMY  1959  . TRANSTHORACIC ECHOCARDIOGRAM  06-30-2016     [name]   ef 55-60%/  trivial AR/ mild dilated ascending aorta, 64m/  trivial MR/  mild PR and TR      Current Meds  Medication Sig  . aspirin 81 MG tablet Take 81 mg by mouth daily.    . clopidogrel (PLAVIX) 75 MG tablet TAKE 1 TABLET BY MOUTH EVERY DAY  . Coenzyme Q10 (CO Q-10) 300 MG CAPS Take 1 tablet by mouth every morning.   . mesalamine (APRISO) 0.375 g 24 hr capsule TAKE 4 CAPSULES BY MOUTH EVERY DAY  . Multiple Vitamin (MULTIVITAMIN) tablet Take 1 tablet by mouth daily.  . timolol (TIMOPTIC) 0.5 % ophthalmic solution 1 drop 2 (two) times daily.     Allergies:   Patient has no active allergies.   Social History   Tobacco Use  . Smoking status: Never Smoker  . Smokeless tobacco: Never Used  . Tobacco comment: Married, 1 stepson, 5 g-kids. 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 Alcohol use: Yes    Comment: occasional wine  . Drug use: No     Family Hx: The patient's family history includes Asthma in his sister; CAD in his mother; Diabetes in his mother; Heart attack in his father; Heart disease in his  mother; Heart disease (age of onset: 562 in his father; Lung disease in his mother; Rheum arthritis in his mother. There is no history of Colon cancer, Esophageal cancer, Pancreatic cancer, Kidney disease, or Liver disease.  ROS:   Please see the history of present illness.     All other systems reviewed and are negative.   Prior CV studies:   The following studies were reviewed today:  Echo 02/14/18:   1. Sinus bradycardia in the upper 40s throughout study.  2. Moderate dilatation of the ascending aorta.  3. Aneurysm of the ascending aorta, measured at 4.5 cm. Recommend dedidated imaging ot aorta with CT or MRI if clinically indicated and not recently performed.  4. The  left ventricle appears to be normal in size, have normal wall thickness, with EF of 60-65%. Echo evidence of normal diastolic filling patterns.  5. GLS -19.9%.  6. Right ventricular systolic pressure is is normal.  7. The right ventricle is normal in size, has normal wall thickness and normal systolic function.  8. Normal left atrial size.  9. Normal right atrial size. 10. Mild mitral annular calcification. 11. Mitral valve regurgitation is mild by color flow Doppler. 12. The mitral valve normal in structure and function. 13. Normal tricuspid valve. 14. Aortic valve normal. 15. Aortic valve regurgitation is mild by color flow Doppler. 16. There is mild calcification of the aortic valve. 17. There is mild thickening of the aortic valve. 18. No atrial level shunt detected by color flow Doppler. 19. Tricuspid regurgitation is mild.  Coronary CT-A 12/29/16:  Ascending aneurysm 4.5 cm. <50% LM stenosis.  Patent proximal LAD stent.   Lexiscan Myoview 06/2016:  Nuclear stress EF: 55%.  Blood pressure demonstrated a hypertensive response to exercise.  There was no ST segment deviation noted during stress.  The study is normal.  This is a low risk study.  The left ventricular ejection fraction is normal  (55-65%).  Normal resting and stress perfusion. No ischemia or infarction EF 55%  Echo 06/30/16:  Nuclear stress EF: 55%.  Blood pressure demonstrated a hypertensive response to exercise.  There was no ST segment deviation noted during stress.  The study is normal.  This is a low risk study.  The left ventricular ejection fraction is normal (55-65%).  Normal resting and stress perfusion. No ischemia or infarction EF 55%  CPX 05/23/13: Normal  Labs/Other Tests and Data Reviewed:    EKG:  No ECG reviewed.  Recent Labs: 12/10/2018: BUN 17; Creatinine, Ser 1.07; Potassium 4.1; Sodium 141 01/04/2019: ALT 40   Recent Lipid Panel Lab Results  Component Value Date/Time   CHOL 136 12/10/2018 08:15 AM   TRIG 78 12/10/2018 08:15 AM   TRIG 46 12/19/2008 12:00 AM   HDL 51 12/10/2018 08:15 AM   CHOLHDL 2.7 12/10/2018 08:15 AM   CHOLHDL 4 03/25/2016 09:14 AM   LDLCALC 70 12/10/2018 08:15 AM    Wt Readings from Last 3 Encounters:  02/14/19 145 lb (65.8 kg)  02/07/18 149 lb 9.6 oz (67.9 kg)  11/13/17 145 lb 15.1 oz (66.2 kg)     Objective:    Vital Signs:  BP 123/80   Pulse (!) 51   Ht 5' 8.5" (1.74 m)   Wt 145 lb (65.8 kg)   BMI 21.73 kg/m    VITAL SIGNS:  reviewed GEN:  no acute distress EYES:  sclerae anicteric, EOMI - Extraocular Movements Intact RESPIRATORY:  normal respiratory effort, symmetric expansion CARDIOVASCULAR:  no peripheral edema SKIN:  no rash, lesions or ulcers. MUSCULOSKELETAL:  no obvious deformities. NEURO:  alert and oriented x 3, no obvious focal deficit PSYCH:  normal affect  ASSESSMENT & PLAN:    # Ascending aorta aneurysm: 4.5cm on chest CT-A 12/2016.  He had trouble with his insurance approving a CT scan in the past.  Stable on echo 01/2018.  Repeat echo.  He would like to do this after he gets a Covid vaccine.  BP well-controlled.   #CAD: # Hyperlipidemia: Mr. Pinzon had a stent in the LAD in 2004.  He is doing well clinically and  has no angina.  His shortness of breath is chronic and unrelated. Atorvastatin was discontinued due to elevated LFTs.  However  he was also taking valacyclovir at the time.  We discussed restarting atorvastatin or trying rosuvastatin.  Lipids were not controlled on simvastatin.  He would like to retry atorvastatin 80 mg.  Recheck lipids and CMP in 6 weeks.  # Shortness of breath: May be allergy related.  He has dogs and a known dog allergy. Chronic issue that has been evaluated.  # Elevated BP: BP well-controlled for the most part. Continue to monitor and manage with diet/exercise.  Goal <130/80.  COVID-19 Education: The signs and symptoms of COVID-19 were discussed with the patient and how to seek care for testing (follow up with PCP or arrange E-visit).   The importance of social distancing was discussed today.  Time:   Today, I have spent 21 minutes with the patient with telehealth technology discussing the above problems.     Medication Adjustments/Labs and Tests Ordered: Current medicines are reviewed at length with the patient today.  Concerns regarding medicines are outlined above.   Tests Ordered: Orders Placed This Encounter  Procedures  . Lipid panel  . Comprehensive metabolic panel    Medication Changes: Meds ordered this encounter  Medications  . atorvastatin (LIPITOR) 80 MG tablet    Sig: Take 1 tablet (80 mg total) by mouth daily.    Dispense:  90 tablet    Refill:  3    Follow Up:  Either In Person or Virtual in 6 month(s)  Signed, Skeet Latch, MD  02/14/2019 10:56 AM    [place]

## 2019-02-25 ENCOUNTER — Ambulatory Visit: Payer: Medicare Other | Attending: Internal Medicine

## 2019-02-25 DIAGNOSIS — Z23 Encounter for immunization: Secondary | ICD-10-CM | POA: Insufficient documentation

## 2019-02-25 NOTE — Progress Notes (Signed)
   Covid-19 Vaccination Clinic  Name:  Hassaan Crite    MRN: [id number] DOB: Apr 19, 1950  02/25/2019  Mr. Kassebaum was observed post Covid-19 immunization for 15 minutes without incidence. He was provided with Vaccine Information Sheet and instruction to access the V-Safe system.   Mr. Tapp was instructed to call 911 with any severe reactions post vaccine: Marland Kitchen Difficulty breathing  . Swelling of your face and throat  . A fast heartbeat  . A bad rash all over your body  . Dizziness and weakness    Immunizations Administered    Name Date Dose VIS Date Route   Pfizer COVID-19 Vaccine 02/25/2019  8:10 AM 0.3 mL 12/28/2018 Intramuscular   Manufacturer: [place]   Lot: [id number]   [place]: [id number]

## 2019-03-12 ENCOUNTER — Other Ambulatory Visit: Payer: Self-pay | Admitting: Cardiovascular Disease

## 2019-03-15 ENCOUNTER — Ambulatory Visit: Payer: Medicare Other

## 2019-03-20 ENCOUNTER — Ambulatory Visit: Payer: Medicare Other | Attending: Internal Medicine

## 2019-03-20 DIAGNOSIS — Z23 Encounter for immunization: Secondary | ICD-10-CM | POA: Insufficient documentation

## 2019-03-20 NOTE — Progress Notes (Signed)
   Covid-19 Vaccination Clinic  Name:  Joseph Booker    MRN: [id number] DOB: 05/01/1950  03/20/2019  Joseph Booker was observed post Covid-19 immunization for 15 minutes without incident. He was provided with Vaccine Information Sheet and instruction to access the V-Safe system.   Joseph Booker was instructed to call 911 with any severe reactions post vaccine: Marland Kitchen Difficulty breathing  . Swelling of face and throat  . A fast heartbeat  . A bad rash all over body  . Dizziness and weakness   Immunizations Administered    Name Date Dose VIS Date Route   Pfizer COVID-19 Vaccine 03/20/2019  9:06 AM 0.3 mL 12/28/2018 Intramuscular   Manufacturer: [place]   Lot: [id number]   [place]: [id number]

## 2019-03-21 ENCOUNTER — Encounter: Payer: Self-pay | Admitting: Internal Medicine

## 2019-03-21 MED ORDER — ONDANSETRON 4 MG PO TBDP: 4.0000 mg | ORAL_TABLET | Freq: Three times a day (TID) | ORAL | 0 refills | Status: DC | PRN

## 2019-03-29 DIAGNOSIS — B0052 Herpesviral keratitis: Secondary | ICD-10-CM | POA: Diagnosis not present

## 2019-04-03 DIAGNOSIS — E782 Mixed hyperlipidemia: Secondary | ICD-10-CM | POA: Diagnosis not present

## 2019-04-03 DIAGNOSIS — Z5181 Encounter for therapeutic drug level monitoring: Secondary | ICD-10-CM | POA: Diagnosis not present

## 2019-04-03 LAB — COMPREHENSIVE METABOLIC PANEL
ALT: 739 IU/L (ref 0–44)
AST: 568 IU/L (ref 0–40)
Albumin/Globulin Ratio: 1.1 — ABNORMAL LOW (ref 1.2–2.2)
Albumin: 3.9 g/dL (ref 3.8–4.8)
Alkaline Phosphatase: 96 IU/L (ref 39–117)
BUN/Creatinine Ratio: 16 (ref 10–24)
BUN: 17 mg/dL (ref 8–27)
Bilirubin Total: 0.9 mg/dL (ref 0.0–1.2)
CO2: 25 mmol/L (ref 20–29)
Calcium: 9 mg/dL (ref 8.6–10.2)
Chloride: 102 mmol/L (ref 96–106)
Creatinine, Ser: 1.06 mg/dL (ref 0.76–1.27)
GFR calc Af Amer: 84 mL/min/{1.73_m2} (ref >59)
GFR calc non Af Amer: 73 mL/min/{1.73_m2} (ref >59)
Globulin, Total: 3.4 g/dL (ref 1.5–4.5)
Glucose: 85 mg/dL (ref 65–99)
Potassium: 4.2 mmol/L (ref 3.5–5.2)
Sodium: 138 mmol/L (ref 134–144)
Total Protein: 7.3 g/dL (ref 6.0–8.5)

## 2019-04-03 LAB — LIPID PANEL
Chol/HDL Ratio: 3.3 ratio (ref 0.0–5.0)
Cholesterol, Total: 116 mg/dL (ref 100–199)
HDL: 35 mg/dL — ABNORMAL LOW (ref >39)
LDL Chol Calc (NIH): 63 mg/dL (ref 0–99)
Triglycerides: 96 mg/dL (ref 0–149)
VLDL Cholesterol Cal: 18 mg/dL (ref 5–40)

## 2019-04-04 ENCOUNTER — Telehealth: Payer: Self-pay | Admitting: Gastroenterology

## 2019-04-04 ENCOUNTER — Telehealth: Payer: Self-pay | Admitting: *Deleted

## 2019-04-04 ENCOUNTER — Other Ambulatory Visit: Payer: Self-pay | Admitting: Gastroenterology

## 2019-04-04 ENCOUNTER — Encounter: Payer: Self-pay | Admitting: Cardiovascular Disease

## 2019-04-04 DIAGNOSIS — R7989 Other specified abnormal findings of blood chemistry: Secondary | ICD-10-CM

## 2019-04-04 NOTE — Telephone Encounter (Signed)
Spoke with patient and advised of labs  Scheduled appointment with Pharm D He will call ophthalmologist regarding taking Valacyclovir and GI regarding Mesalamine

## 2019-04-04 NOTE — Telephone Encounter (Signed)
Patient called said he had labs done and his liver enzymes are elevated.  AST 568 and ALT 739  Is wondering if the Mesalamine has anything to do with this please advise.

## 2019-04-04 NOTE — Telephone Encounter (Signed)
Released in my chart with Dr Blenda Mounts comments attached and left message to call back

## 2019-04-04 NOTE — Telephone Encounter (Signed)
-----   Message from Skeet Latch, MD sent at 04/04/2019  1:22 PM EDT ----- Absolutely stop the atorvastatin.  We will add it as an allergy.  Repeat LFTs in 2 weeks.  We will have him see the pharmacists to consider Repatha.  I wouldn't try a statin again.

## 2019-04-05 NOTE — Telephone Encounter (Signed)
Patient notified. He agrees to this plan. He will ask the Cardiologist to cc [place] on the labs.  Plan telephone follow up after labs to schedule an appointment.

## 2019-04-05 NOTE — Telephone Encounter (Signed)
Less likely transaminitis secondary to Mesalamine as he has been on it long term.  Agree with discontinuing statin. We can hold Mesalamine for few weeks.  Recheck LFT , PT/INR in 1 week. Please schedule office follow up visit with me or APP next available soon. Thanks

## 2019-04-05 NOTE — Telephone Encounter (Signed)
He had his labs through Cardiology. They immediately stopped his statin drug. See their notes.  Please advise on the Mesalamine. Thanks

## 2019-04-15 ENCOUNTER — Ambulatory Visit (INDEPENDENT_AMBULATORY_CARE_PROVIDER_SITE_OTHER): Payer: Medicare Other | Admitting: Pharmacist

## 2019-04-15 ENCOUNTER — Other Ambulatory Visit: Payer: Self-pay

## 2019-04-15 VITALS — BP 132/84 | HR 60 | Resp 16 | Ht 68.5 in | Wt 153.2 lb

## 2019-04-15 DIAGNOSIS — E782 Mixed hyperlipidemia: Secondary | ICD-10-CM

## 2019-04-15 DIAGNOSIS — R7989 Other specified abnormal findings of blood chemistry: Secondary | ICD-10-CM

## 2019-04-15 NOTE — Patient Instructions (Addendum)
Lipid Clinic (pharmacy) [phone] Joseph Booker/Joseph Booker/ Joseph Booker  *Repeat blood work in 1 week* *Will verify cost for Repatha/Praluent with insurance* *Will start medication once liver function test down ton 100s*   High Cholesterol  High cholesterol is a condition in which the blood has high levels of a white, waxy, fat-like substance (cholesterol). The human body needs small amounts of cholesterol. The liver makes all the cholesterol that the body needs. Extra (excess) cholesterol comes from the food that we eat. Cholesterol is carried from the liver by the blood through the blood vessels. If you have high cholesterol, deposits (plaques) may build up on the walls of your blood vessels (arteries). Plaques make the arteries narrower and stiffer. Cholesterol plaques increase your risk for heart attack and stroke. Work with your health care provider to keep your cholesterol levels in a healthy range. What increases the risk? This condition is more likely to develop in people who:  Eat foods that are high in animal fat (saturated fat) or cholesterol.  Are overweight.  Are not getting enough exercise.  Have a family history of high cholesterol. What are the signs or symptoms? There are no symptoms of this condition. How is this diagnosed? This condition may be diagnosed from the results of a blood test.  If you are older than age 17, your health care provider may check your cholesterol every 4-6 years.  You may be checked more often if you already have high cholesterol or other risk factors for heart disease. The blood test for cholesterol measures:  "Bad" cholesterol (LDL cholesterol). This is the main type of cholesterol that causes heart disease. The desired level for LDL is less than 100.  "Good" cholesterol (HDL cholesterol). This type helps to protect against heart disease by cleaning the arteries and carrying the LDL away. The desired level for HDL is 60 or  higher.  Triglycerides. These are fats that the body can store or burn for energy. The desired number for triglycerides is lower than 150.  Total cholesterol. This is a measure of the total amount of cholesterol in your blood, including LDL cholesterol, HDL cholesterol, and triglycerides. A healthy number is less than 200. How is this treated? This condition is treated with diet changes, lifestyle changes, and medicines. Diet changes  This may include eating more whole grains, fruits, vegetables, nuts, and fish.  This may also include cutting back on red meat and foods that have a lot of added sugar. Lifestyle changes  Changes may include getting at least 40 minutes of aerobic exercise 3 times a week. Aerobic exercises include walking, biking, and swimming. Aerobic exercise along with a healthy diet can help you maintain a healthy weight.  Changes may also include quitting smoking. Medicines  Medicines are usually given if diet and lifestyle changes have failed to reduce your cholesterol to healthy levels.  Your health care provider may prescribe a statin medicine. Statin medicines have been shown to reduce cholesterol, which can reduce the risk of heart disease. Follow these instructions at home: Eating and drinking If told by your health care provider:  Eat chicken (without skin), fish, veal, shellfish, ground [place] breast, and round or loin cuts of red meat.  Do not eat fried foods or fatty meats, such as hot dogs and salami.  Eat plenty of fruits, such as apples.  Eat plenty of vegetables, such as broccoli, potatoes, and carrots.  Eat beans, peas, and lentils.  Eat grains such as barley, rice, couscous, and bulgur wheat.  Eat pasta without cream sauces.  Use skim or nonfat milk, and eat low-fat or nonfat yogurt and cheeses.  Do not eat or drink whole milk, cream, ice cream, egg yolks, or hard cheeses.  Do not eat stick margarine or tub margarines that contain trans  fats (also called partially hydrogenated oils).  Do not eat saturated tropical oils, such as coconut oil and palm oil.  Do not eat cakes, cookies, crackers, or other baked goods that contain trans fats.  General instructions  Exercise as directed by your health care provider. Increase your activity level with activities such as gardening, walking, and taking the stairs.  Take over-the-counter and prescription medicines only as told by your health care provider.  Do not use any products that contain nicotine or tobacco, such as cigarettes and e-cigarettes. If you need help quitting, ask your health care provider.  Keep all follow-up visits as told by your health care provider. This is important. Contact a health care provider if:  You are struggling to maintain a healthy diet or weight.  You need help to start on an exercise program.  You need help to stop smoking. Get help right away if:  You have chest pain.  You have trouble breathing. This information is not intended to replace advice given to you by your health care provider. Make sure you discuss any questions you have with your health care provider. Document Revised: 01/06/2017 Document Reviewed: 07/04/2015 Elsevier Patient Education  [place].

## 2019-04-15 NOTE — Progress Notes (Signed)
Patient ID: Joseph Booker                 DOB: [birth year]                    MRN: [id number]     HPI: Joseph Booker is a 69 y.o. male patient referred to lipid clinic by Dr. Oval Linsey. PMH is significant for CAD s/p stent placement, glaucoma, hyperlipidemia, AAA, and ulcerative colitis. Noted transaminitis with atorvastatin and contraindication to all statins.  Patient presents for potential initiation of PCSK9i and medication titration.   Current Medications: none  Intolerances: atorvastatin - elevated LFTs  LDL goal: < 62m/dL  Diet: balanced diet, low in fat  Family History: The patient's family history includes Asthma in his sister; CAD in his mother; Diabetes in his mother; Heart attack in his father; Heart disease in his mother; Heart disease (age of onset: 516 in his father; Lung disease in his mother; Rheum arthritis in his mother.   Social History: The patient  reports that he has never smoked. He has never used smokeless tobacco. He reports current alcohol use. He reports that he does not use drugs.   Labs: 04/03/2019: CHO 116, TG 96, HDL 35, LDL-c 63, AST 568, ALT 739 (on atorvastatin 881m  Past Medical History:  Diagnosis Date  . Allergic rhinitis   . Anxiety state, unspecified   . CAD (coronary artery disease)    [name] / nuclear 2005, excellent tolerance no scar or ischemia, EF 51% / nuclear, February, 2012, no scar or ischemia / catheterization March 26, 2010.. 10% in-stent restenosis, minimal other nonobstructive coronary disease, ejection fraction 55%., excellent result; ETT 9/13: ex 13:14, no CP, no ischemic ECG changes  . Glaucoma, left eye   . History of exercise intolerance 05/23/2013   cardiopulmonary exercise test-- normal functional capacity when compared to matched sedentary norms, borderline ventilatory limitation with the patient's exercise tidal volume reaching 90% of the measured inspiratory capacity ( pt reports no dyspnea), did not appear to be  circulatory limitation, normal CPX test  . Hyperlipidemia   . Nocturia   . Osteopenia   . [facility]   followed by dr niJohnsie Cancel-  last CT 12-29-2016 ,  4.5cm  . [place]   [place] dr [place]io Decamp  dx 1990s  (per note mild Mayo Score 1)  . Wears glasses     Current Outpatient Medications on File Prior to Visit  Medication Sig Dispense Refill  . aspirin 81 MG tablet Take 81 mg by mouth daily.      . clopidogrel (PLAVIX) 75 MG tablet Take 1 tablet (75 mg total) by mouth daily. **Needs OV** 60 tablet 1  . Coenzyme Q10 (CO Q-10) 300 MG CAPS Take 1 tablet by mouth every morning.     . mesalamine (APRISO) 0.375 g 24 hr capsule TAKE 4 CAPSULES BY MOUTH EVERY DAY 360 capsule 2  . Multiple Vitamin (MULTIVITAMIN) tablet Take 1 tablet by mouth daily.    . timolol (TIMOPTIC) 0.5 % ophthalmic solution 1 drop 2 (two) times daily.     No current facility-administered medications on file prior to visit.    Allergies  Allergen Reactions  . Atorvastatin     transaminits    Hyperlipidemia Noted LDL at goal with atorvastatin 8028maily but caused elevated LFTs above 5 time normal limits. Patient follows a balanced diet with limited fat intake. We discussed Repatha/Praluent mechanism of action, storage, administration,  prior-authorization, and common side effects.  Will repeat LFT and fasting blood work in 1-2 weeks. Plan to start PCSK9i as soon as LFT are 100 or less and repeat fasting blood work 6-8 weeks after initiating new medication.      Leeyah Heather Rodriguez-Guzman PharmD, BCPS, [place] [id number] 04/21/2019 2:23 PM

## 2019-04-21 ENCOUNTER — Encounter: Payer: Self-pay | Admitting: Pharmacist

## 2019-04-21 NOTE — Assessment & Plan Note (Signed)
Noted LDL at goal with atorvastatin 47m daily but caused elevated LFTs above 5 time normal limits. Patient follows a balanced diet with limited fat intake. We discussed Repatha/Praluent mechanism of action, storage, administration, prior-authorization, and common side effects.  Will repeat LFT and fasting blood work in 1-2 weeks. Plan to start PCSK9i as soon as LFT are 100 or less and repeat fasting blood work 6-8 weeks after initiating new medication.

## 2019-04-22 DIAGNOSIS — R7989 Other specified abnormal findings of blood chemistry: Secondary | ICD-10-CM | POA: Diagnosis not present

## 2019-04-22 DIAGNOSIS — E782 Mixed hyperlipidemia: Secondary | ICD-10-CM | POA: Diagnosis not present

## 2019-04-23 LAB — LIPID PANEL WITH LDL/HDL RATIO
Cholesterol, Total: 170 mg/dL (ref 100–199)
HDL: 47 mg/dL (ref >39)
LDL Chol Calc (NIH): 97 mg/dL (ref 0–99)
LDL/HDL Ratio: 2.1 ratio (ref 0.0–3.6)
Triglycerides: 148 mg/dL (ref 0–149)
VLDL Cholesterol Cal: 26 mg/dL (ref 5–40)

## 2019-04-23 LAB — HEPATIC FUNCTION PANEL
ALT: 128 IU/L — ABNORMAL HIGH (ref 0–44)
AST: 81 IU/L — ABNORMAL HIGH (ref 0–40)
Albumin: 4 g/dL (ref 3.8–4.8)
Alkaline Phosphatase: 80 IU/L (ref 39–117)
Bilirubin Total: 0.7 mg/dL (ref 0.0–1.2)
Bilirubin, Direct: 0.2 mg/dL (ref 0.00–0.40)
Total Protein: 7.2 g/dL (ref 6.0–8.5)

## 2019-04-24 ENCOUNTER — Telehealth: Payer: Self-pay

## 2019-04-24 MED ORDER — REPATHA SURECLICK 140 MG/ML ~~LOC~~ SOAJ: 140.0000 mg | SUBCUTANEOUS | 11 refills | Status: DC

## 2019-04-24 NOTE — Telephone Encounter (Signed)
Called and lmomed the pt stated that the repatha was approved from insurance, rx sent, instructed the pt to call back if unaffordable

## 2019-04-30 ENCOUNTER — Other Ambulatory Visit (HOSPITAL_COMMUNITY): Payer: Self-pay | Admitting: Cardiovascular Disease

## 2019-04-30 DIAGNOSIS — I719 Aortic aneurysm of unspecified site, without rupture: Secondary | ICD-10-CM

## 2019-04-30 DIAGNOSIS — I77819 Aortic ectasia, unspecified site: Secondary | ICD-10-CM

## 2019-05-01 ENCOUNTER — Telehealth: Payer: Self-pay | Admitting: Cardiovascular Disease

## 2019-05-01 ENCOUNTER — Other Ambulatory Visit: Payer: Self-pay

## 2019-05-01 ENCOUNTER — Ambulatory Visit (HOSPITAL_COMMUNITY): Payer: Medicare Other | Attending: Cardiovascular Disease

## 2019-05-01 DIAGNOSIS — Z8249 Family history of ischemic heart disease and other diseases of the circulatory system: Secondary | ICD-10-CM | POA: Diagnosis not present

## 2019-05-01 DIAGNOSIS — E785 Hyperlipidemia, unspecified: Secondary | ICD-10-CM | POA: Diagnosis not present

## 2019-05-01 DIAGNOSIS — I712 Thoracic aortic aneurysm, without rupture: Secondary | ICD-10-CM

## 2019-05-01 DIAGNOSIS — I719 Aortic aneurysm of unspecified site, without rupture: Secondary | ICD-10-CM

## 2019-05-01 DIAGNOSIS — I059 Rheumatic mitral valve disease, unspecified: Secondary | ICD-10-CM | POA: Diagnosis not present

## 2019-05-01 DIAGNOSIS — I77810 Thoracic aortic ectasia: Secondary | ICD-10-CM | POA: Diagnosis not present

## 2019-05-01 DIAGNOSIS — E782 Mixed hyperlipidemia: Secondary | ICD-10-CM

## 2019-05-01 DIAGNOSIS — I77819 Aortic ectasia, unspecified site: Secondary | ICD-10-CM | POA: Diagnosis not present

## 2019-05-01 DIAGNOSIS — I2510 Atherosclerotic heart disease of native coronary artery without angina pectoris: Secondary | ICD-10-CM | POA: Insufficient documentation

## 2019-05-01 DIAGNOSIS — R7989 Other specified abnormal findings of blood chemistry: Secondary | ICD-10-CM

## 2019-05-01 NOTE — Telephone Encounter (Signed)
Patient calling for lab results.

## 2019-05-02 NOTE — Telephone Encounter (Signed)
Dr Oval Linsey reviewed labs and [facility] message sent

## 2019-05-03 NOTE — Telephone Encounter (Signed)
Patient is calling to follow up in regards to lab results. He states he is requesting to speak with Raquel Guzman-Rodriguez to discuss his results.

## 2019-05-03 NOTE — Telephone Encounter (Signed)
Routed to CVRR

## 2019-05-06 NOTE — Addendum Note (Signed)
Addended by: Harrington Challenger on: 05/06/2019 08:26 AM   Modules accepted: Orders

## 2019-05-10 DIAGNOSIS — R7989 Other specified abnormal findings of blood chemistry: Secondary | ICD-10-CM | POA: Diagnosis not present

## 2019-05-10 DIAGNOSIS — E782 Mixed hyperlipidemia: Secondary | ICD-10-CM | POA: Diagnosis not present

## 2019-05-11 LAB — NMR, LIPOPROFILE
Cholesterol, Total: 199 mg/dL (ref 100–199)
HDL Particle Number: 24.7 umol/L — ABNORMAL LOW (ref >=30.5)
HDL-C: 55 mg/dL (ref >39)
LDL Particle Number: 1549 nmol/L — ABNORMAL HIGH (ref <1000)
LDL Size: 21.4 nm (ref >20.5)
LDL-C (NIH Calc): 128 mg/dL — ABNORMAL HIGH (ref 0–99)
LP-IR Score: <25 (ref <=45)
Small LDL Particle Number: 650 nmol/L — ABNORMAL HIGH (ref <=527)
Triglycerides: 91 mg/dL (ref 0–149)

## 2019-05-11 LAB — HEPATIC FUNCTION PANEL
ALT: 48 IU/L — ABNORMAL HIGH (ref 0–44)
AST: 51 IU/L — ABNORMAL HIGH (ref 0–40)
Albumin: 4.1 g/dL (ref 3.8–4.8)
Alkaline Phosphatase: 68 IU/L (ref 39–117)
Bilirubin Total: 1 mg/dL (ref 0.0–1.2)
Bilirubin, Direct: 0.24 mg/dL (ref 0.00–0.40)
Total Protein: 7.1 g/dL (ref 6.0–8.5)

## 2019-05-14 ENCOUNTER — Telehealth: Payer: Self-pay | Admitting: Pharmacist

## 2019-05-14 NOTE — Telephone Encounter (Signed)
Medication Samples have been provided to the patient.  Drug name: Repatha  SureClick     Strength: [id number]       Qty: 2  LOT: [id number]  Exp.Date: 07/23   Morenike Cuff Rodriguez-Guzman PharmD, BCPS, [place] [id number] 05/14/2019 7:21 AM

## 2019-06-04 ENCOUNTER — Telehealth: Payer: Self-pay | Admitting: Pharmacist

## 2019-06-04 DIAGNOSIS — R7989 Other specified abnormal findings of blood chemistry: Secondary | ICD-10-CM

## 2019-06-04 DIAGNOSIS — E782 Mixed hyperlipidemia: Secondary | ICD-10-CM

## 2019-06-04 NOTE — Telephone Encounter (Signed)
Patient to repeat Lft and LDL test after 1 month on Repatha after recent dx of elevated liver enzymes with statins

## 2019-06-05 DIAGNOSIS — R7989 Other specified abnormal findings of blood chemistry: Secondary | ICD-10-CM | POA: Diagnosis not present

## 2019-06-05 DIAGNOSIS — E782 Mixed hyperlipidemia: Secondary | ICD-10-CM | POA: Diagnosis not present

## 2019-06-05 LAB — HEPATIC FUNCTION PANEL
ALT: 32 IU/L (ref 0–44)
AST: 38 IU/L (ref 0–40)
Albumin: 4.1 g/dL (ref 3.8–4.8)
Alkaline Phosphatase: 64 IU/L (ref 48–121)
Bilirubin Total: 0.9 mg/dL (ref 0.0–1.2)
Bilirubin, Direct: 0.23 mg/dL (ref 0.00–0.40)
Total Protein: 6.9 g/dL (ref 6.0–8.5)

## 2019-06-05 LAB — LIPID PANEL WITH LDL/HDL RATIO
Cholesterol, Total: 138 mg/dL (ref 100–199)
HDL: 63 mg/dL (ref >39)
LDL Chol Calc (NIH): 58 mg/dL (ref 0–99)
LDL/HDL Ratio: 0.9 ratio (ref 0.0–3.6)
Triglycerides: 94 mg/dL (ref 0–149)
VLDL Cholesterol Cal: 17 mg/dL (ref 5–40)

## 2019-08-12 ENCOUNTER — Other Ambulatory Visit: Payer: Self-pay | Admitting: Cardiovascular Disease

## 2019-08-28 DIAGNOSIS — L718 Other rosacea: Secondary | ICD-10-CM | POA: Diagnosis not present

## 2019-08-28 DIAGNOSIS — H02883 Meibomian gland dysfunction of right eye, unspecified eyelid: Secondary | ICD-10-CM | POA: Diagnosis not present

## 2019-08-28 DIAGNOSIS — H02886 Meibomian gland dysfunction of left eye, unspecified eyelid: Secondary | ICD-10-CM | POA: Diagnosis not present

## 2019-09-13 ENCOUNTER — Encounter: Payer: Self-pay | Admitting: Cardiovascular Disease

## 2019-09-13 ENCOUNTER — Telehealth (INDEPENDENT_AMBULATORY_CARE_PROVIDER_SITE_OTHER): Payer: Medicare Other | Admitting: Cardiovascular Disease

## 2019-09-13 VITALS — BP 133/86 | HR 55 | Temp 97.4°F | Ht 68.5 in | Wt 145.0 lb

## 2019-09-13 DIAGNOSIS — I712 Thoracic aortic aneurysm, without rupture, unspecified: Secondary | ICD-10-CM

## 2019-09-13 DIAGNOSIS — E782 Mixed hyperlipidemia: Secondary | ICD-10-CM | POA: Diagnosis not present

## 2019-09-13 DIAGNOSIS — Z5181 Encounter for therapeutic drug level monitoring: Secondary | ICD-10-CM | POA: Diagnosis not present

## 2019-09-13 DIAGNOSIS — I340 Nonrheumatic mitral (valve) insufficiency: Secondary | ICD-10-CM | POA: Diagnosis not present

## 2019-09-13 DIAGNOSIS — I2510 Atherosclerotic heart disease of native coronary artery without angina pectoris: Secondary | ICD-10-CM | POA: Diagnosis not present

## 2019-09-13 DIAGNOSIS — I77810 Thoracic aortic ectasia: Secondary | ICD-10-CM | POA: Diagnosis not present

## 2019-09-13 NOTE — Progress Notes (Signed)
Virtual Visit via Video Note   This visit type was conducted due to national recommendations for restrictions regarding the COVID-19 Pandemic (e.g. social distancing) in an effort to limit this patient's exposure and mitigate transmission in our community.  Due to his co-morbid illnesses, this patient is at least at moderate risk for complications without adequate follow up.  This format is felt to be most appropriate for this patient at this time.  All issues noted in this document were discussed and addressed.  A limited physical exam was performed with this format.  Please refer to the patient's chart for his consent to telehealth for [facility].   Date:  09/13/2019   ID:  Gregor Dershem, DOB 1950-08-21, MRN [id number]  Patient Location: Home Provider Location: Office/Clinic  PCP:  Binnie Rail, MD  Cardiologist:  Skeet Latch, MD  Electrophysiologist:  None   Evaluation Performed:  Follow-Up Visit  Chief Complaint:  Hyperlipidemia, shortness of breath  History of Present Illness:    Karanvir Balderston is a 69 y.o. male with CAD, hyperlipidemia, and mild ascending aorta aneurysm here for follow up.  [name] had an LAD PCI in 2004. He had a [employer] 06/2016 that revealed LVEF 55% and no ischemia.  He last saw [name] 02/15/17 at which time he reported some non-exertional shortness of breath.  This has been an ongoing complaint for years.  He had a CPX in 2015 that was normal.  He saw an allergist who suggested that he might have inflammation in his lungs.  His cholesterol is not well controlled on simvastatin so this was switched to atorvastatin.  However his LDL remained above 70 at 40 mg.  It was increased to 80 mg but he developed transaminitis.  Since his last appointment he started on Repatha and tolerated it well.  His liver enzymes have been stable.  He has gained weight and attributes this to not exercising. Started back exercising this week.  He is going to working on  his diet.  He feels well walking 3-5 miles for 3-5 days per week.  He has no exertional symptoms.  He denies any LE edema, orthopnea or PND.  He denies palpitations lightheadedness or dizziness.     The patient does not have symptoms concerning for COVID-19 infection (fever, chills, cough, or new shortness of breath).    Past Medical History:  Diagnosis Date  . Allergic rhinitis   . Anxiety state, unspecified   . CAD (coronary artery disease)    [name] / nuclear 2005, excellent tolerance no scar or ischemia, EF 51% / nuclear, February, 2012, no scar or ischemia / catheterization March 26, 2010.. 10% in-stent restenosis, minimal other nonobstructive coronary disease, ejection fraction 55%., excellent result; ETT 9/13: ex 13:14, no CP, no ischemic ECG changes  . Glaucoma, left eye   . History of exercise intolerance 05/23/2013   cardiopulmonary exercise test-- normal functional capacity when compared to matched sedentary norms, borderline ventilatory limitation with the patient's exercise tidal volume reaching 90% of the measured inspiratory capacity ( pt reports no dyspnea), did not appear to be circulatory limitation, normal CPX test  . Hyperlipidemia   . Nocturia   . Osteopenia   . Thoracic ascending aortic aneurysm [facility])    followed by [name]---  last CT 12-29-2016 ,  4.5cm  . Ulcerative colitis, universal ([place])    GI-- [name]--  dx 1990s  (per note mild Mayo Score 1)  . Wears glasses  Past Surgical History:  Procedure Laterality Date  . CARDIAC CATHETERIZATION  03/26/2010    dr Dian Queen   single vessel CAD w/ patent mLAD stent w/ 10% in-stent restenosis w/ mild to moderate disease in the remainder LAD w/ no lesions that appearedto be flow-limitinf/  normal LVFSF, ef 55%  . CARDIOVASCULAR STRESS TEST  06-30-2016   [name]   Low risk nuclear study w/ no evidence ishcemia/  normal LV function and wall motion,  nuclear stress ef 55%  . COLONOSCOPY  last one  10-27-2016  . CORONARY ANGIOPLASTY WITH STENT PLACEMENT  07-25-2002   [name]   PTCA w/  DES x1 to mLAD  . INGUINAL HERNIA REPAIR Left 2006  . INGUINAL HERNIA REPAIR Right 07/03/2017   Procedure: OPEN REPAIR OF RIGHT INGUINAL HERNIA WITH MESH;  Surgeon: Kinsinger, Arta Bruce, MD;  Location: [place];  Service: General;  Laterality: Right;  . INSERTION OF MESH Right 07/03/2017   Procedure: INSERTION OF MESH;  Surgeon: Kinsinger, Arta Bruce, MD;  Location: [facility];  Service: General;  Laterality: Right;  . SEPTOPLASTY  1987   w/ RHINOPLASTY  . TONSILLECTOMY  1959  . TRANSTHORACIC ECHOCARDIOGRAM  06-30-2016     [name]   ef 55-60%/  trivial AR/ mild dilated ascending aorta, 33m/  trivial MR/  mild PR and TR      Current Meds  Medication Sig  . aspirin 81 MG tablet Take 81 mg by mouth daily.    . clopidogrel (PLAVIX) 75 MG tablet TAKE 1 TABLET (75 MG TOTAL) BY MOUTH DAILY.  .Marland KitchenCoenzyme Q10 (CO Q-10) 300 MG CAPS Take 1 tablet by mouth every morning.   .Marland Kitchendoxycycline (MONODOX) 50 MG capsule Take 50 mg by mouth daily.   . Evolocumab (REPATHA SURECLICK) 1284MG/ML SOAJ Inject 140 mg into the skin every 14 (fourteen) days.  . mesalamine (APRISO) 0.375 g 24 hr capsule TAKE 4 CAPSULES BY MOUTH EVERY DAY  . Multiple Vitamin (MULTIVITAMIN) tablet Take 1 tablet by mouth daily.  . timolol (TIMOPTIC) 0.5 % ophthalmic solution Place 1 drop into both eyes 2 (two) times daily.      Allergies:   Atorvastatin   Social History   Tobacco Use  . Smoking status: Never Smoker  . Smokeless tobacco: Never Used  . Tobacco comment: Married, 1 stepson, 5 g-kids. 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 Vaping Use: Never used  Substance Use Topics  . Alcohol use: Yes    Comment: occasional wine  . Drug use: No     Family Hx: The patient's family history includes Asthma in his sister; CAD in his mother; Diabetes in his mother; Heart attack in his  father; Heart disease in his mother; Heart disease (age of onset: 566 in his father; Lung disease in his mother; Rheum arthritis in his mother. There is no history of Colon cancer, Esophageal cancer, Pancreatic cancer, Kidney disease, or Liver disease.  ROS:   Please see the history of present illness.     All other systems reviewed and are negative.   Prior CV studies:   The following studies were reviewed today:  Echo 02/14/18:   1. Sinus bradycardia in the upper 40s throughout study.  2. Moderate dilatation of the ascending aorta.  3. Aneurysm of the ascending aorta, measured at 4.5 cm. Recommend dedidated imaging ot aorta with CT or MRI if clinically indicated and not recently performed.  4. The left  ventricle appears to be normal in size, have normal wall thickness, with EF of 60-65%. Echo evidence of normal diastolic filling patterns.  5. GLS -19.9%.  6. Right ventricular systolic pressure is is normal.  7. The right ventricle is normal in size, has normal wall thickness and normal systolic function.  8. Normal left atrial size.  9. Normal right atrial size. 10. Mild mitral annular calcification. 11. Mitral valve regurgitation is mild by color flow Doppler. 12. The mitral valve normal in structure and function. 13. Normal tricuspid valve. 14. Aortic valve normal. 15. Aortic valve regurgitation is mild by color flow Doppler. 16. There is mild calcification of the aortic valve. 17. There is mild thickening of the aortic valve. 18. No atrial level shunt detected by color flow Doppler. 19. Tricuspid regurgitation is mild.  Coronary CT-A 12/29/16:  Ascending aneurysm 4.5 cm. <50% LM stenosis.  Patent proximal LAD stent.   Lexiscan Myoview 06/2016:  Nuclear stress EF: 55%.  Blood pressure demonstrated a hypertensive response to exercise.  There was no ST segment deviation noted during stress.  The study is normal.  This is a low risk study.  The left ventricular  ejection fraction is normal (55-65%).  Normal resting and stress perfusion. No ischemia or infarction EF 55%  Echo 06/30/16:  Nuclear stress EF: 55%.  Blood pressure demonstrated a hypertensive response to exercise.  There was no ST segment deviation noted during stress.  The study is normal.  This is a low risk study.  The left ventricular ejection fraction is normal (55-65%).  Normal resting and stress perfusion. No ischemia or infarction EF 55%  CPX 05/23/13: Normal  Labs/Other Tests and Data Reviewed:    EKG:  No ECG reviewed.  Recent Labs: 04/03/2019: BUN 17; Creatinine, Ser 1.06; Potassium 4.2; Sodium 138 06/05/2019: ALT 32   Recent Lipid Panel Lab Results  Component Value Date/Time   CHOL 138 06/05/2019 11:19 AM   TRIG 94 06/05/2019 11:19 AM   TRIG 46 12/19/2008 12:00 AM   HDL 63 06/05/2019 11:19 AM   CHOLHDL 3.3 04/03/2019 09:34 AM   CHOLHDL 4 03/25/2016 09:14 AM   LDLCALC 58 06/05/2019 11:19 AM    Wt Readings from Last 3 Encounters:  09/13/19 145 lb (65.8 kg)  04/15/19 153 lb 3.2 oz (69.5 kg)  02/14/19 145 lb (65.8 kg)     Objective:    Vital Signs:  BP 133/86   Pulse (!) 55   Temp (!) 97.4 F (36.3 C)   Ht 5' 8.5" (1.74 m)   Wt 145 lb (65.8 kg)   SpO2 99%   BMI 21.73 kg/m    VITAL SIGNS:  reviewed GEN:  no acute distress EYES:  sclerae anicteric, EOMI - Extraocular Movements Intact RESPIRATORY:  normal respiratory effort, symmetric expansion CARDIOVASCULAR:  no peripheral edema SKIN:  no rash, lesions or ulcers. MUSCULOSKELETAL:  no obvious deformities. NEURO:  alert and oriented x 3, no obvious focal deficit PSYCH:  normal affect  ASSESSMENT & PLAN:    # Ascending aorta aneurysm: 4.5cm on chest CT-A 12/2016.   It was 4.7 cm on his most recent echo.  He is unsettled by minor differences in his imaging report.  In the past we tried getting a CT scan approved and it was denied.  He has new insurance.  In April we will try to get a CT-a  of the aorta.  He understands that controlling his blood pressure is the most important modifiable risk factor.  He  remains uninterested in starting medication.  #CAD: # Hyperlipidemia: Mr. Edmonds had a stent in the LAD in 2004.  He is doing well clinically and has no angina.  His shortness of breath is chronic and unrelated.   Continue aspirin, clopidogrel, and Repatha.  Would add beta-blocker for hypertension but he is not agreeable.  Repeat lipids and a CMP in November or December..  # Essential hypertension: He wants to keep working on diet and exercise. Goal <130/80.  COVID-19 Education: The signs and symptoms of COVID-19 were discussed with the patient and how to seek care for testing (follow up with PCP or arrange E-visit).   The importance of social distancing was discussed today.  Time:   Today, I have spent 25 minutes with the patient with telehealth technology discussing the above problems.     Medication Adjustments/Labs and Tests Ordered: Current medicines are reviewed at length with the patient today.  Concerns regarding medicines are outlined above.   Tests Ordered: Orders Placed This Encounter  Procedures  . CT ANGIO CHEST AORTA W/CM & OR WO/CM  . Comprehensive metabolic panel  . Lipid panel    Medication Changes: No orders of the defined types were placed in this encounter.   Follow Up: with Karion Cudd C. Oval Linsey, MD, [facility] in 04/2020 Signed, Skeet Latch, MD  09/13/2019 10:54 AM    Protection

## 2019-09-13 NOTE — Patient Instructions (Addendum)
Medication Instructions:  Your physician recommends that you continue on your current medications as directed. Please refer to the Current Medication list given to you today.  *If you need a refill on your cardiac medications before your next appointment, please call your pharmacy*  Lab Work: FASTING LP/CMET IN November/DECEMBER   If you have labs (blood work) drawn today and your tests are completely normal, you will receive your results only by: Marland Kitchen MyChart Message (if you have MyChart) OR . A paper copy in the mail If you have any lab test that is abnormal or we need to change your treatment, we will call you to review the results.  Testing/Procedures: Non-Cardiac CT Angiography (CTA), is a special type of CT scan that uses a computer to produce multi-dimensional views of major blood vessels throughout the body. In CT angiography, a contrast material is injected through an IV to help visualize the blood vessels IN April   Follow-Up: At [facility], you and your health needs are our priority.  As part of our continuing mission to provide you with exceptional heart care, we have created designated Provider Care Teams.  These Care Teams include your primary Cardiologist (physician) and Advanced Practice Providers (APPs -  Physician Assistants and Nurse Practitioners) who all work together to provide you with the care you need, when you need it.  We recommend signing up for the patient portal called "MyChart".  Sign up information is provided on this After Visit Summary.  MyChart is used to connect with patients for Virtual Visits (Telemedicine).  Patients are able to view lab/test results, encounter notes, upcoming appointments, etc.  Non-urgent messages can be sent to your provider as well.   To learn more about what you can do with MyChart, go to [email or web address].    Your next appointment:   AFTER YOUR CT IN April/IN PERSON  Provider:   You may see Skeet Latch, MD or one of  the following Advanced Practice Providers on your designated Care Team:    Kerin Ransom, PA-C  [place], PA-C  Coletta Memos, [place]

## 2019-10-04 ENCOUNTER — Ambulatory Visit: Payer: Self-pay | Attending: Internal Medicine

## 2019-10-04 DIAGNOSIS — Z23 Encounter for immunization: Secondary | ICD-10-CM

## 2019-10-07 DIAGNOSIS — R3912 Poor urinary stream: Secondary | ICD-10-CM | POA: Diagnosis not present

## 2019-10-07 DIAGNOSIS — N210 Calculus in bladder: Secondary | ICD-10-CM | POA: Diagnosis not present

## 2019-10-07 DIAGNOSIS — R3121 Asymptomatic microscopic hematuria: Secondary | ICD-10-CM | POA: Diagnosis not present

## 2019-10-07 DIAGNOSIS — N401 Enlarged prostate with lower urinary tract symptoms: Secondary | ICD-10-CM | POA: Diagnosis not present

## 2019-10-18 DIAGNOSIS — Z23 Encounter for immunization: Secondary | ICD-10-CM | POA: Diagnosis not present

## 2019-12-04 DIAGNOSIS — H26491 Other secondary cataract, right eye: Secondary | ICD-10-CM | POA: Diagnosis not present

## 2019-12-04 DIAGNOSIS — H0288B Meibomian gland dysfunction left eye, upper and lower eyelids: Secondary | ICD-10-CM | POA: Diagnosis not present

## 2019-12-04 DIAGNOSIS — H04203 Unspecified epiphora, bilateral lacrimal glands: Secondary | ICD-10-CM | POA: Diagnosis not present

## 2019-12-04 DIAGNOSIS — Z961 Presence of intraocular lens: Secondary | ICD-10-CM | POA: Diagnosis not present

## 2019-12-04 DIAGNOSIS — L718 Other rosacea: Secondary | ICD-10-CM | POA: Diagnosis not present

## 2019-12-04 DIAGNOSIS — H0288A Meibomian gland dysfunction right eye, upper and lower eyelids: Secondary | ICD-10-CM | POA: Diagnosis not present

## 2019-12-04 DIAGNOSIS — H168 Other keratitis: Secondary | ICD-10-CM | POA: Diagnosis not present

## 2019-12-30 DIAGNOSIS — E782 Mixed hyperlipidemia: Secondary | ICD-10-CM | POA: Diagnosis not present

## 2019-12-30 DIAGNOSIS — Z5181 Encounter for therapeutic drug level monitoring: Secondary | ICD-10-CM | POA: Diagnosis not present

## 2019-12-30 DIAGNOSIS — N401 Enlarged prostate with lower urinary tract symptoms: Secondary | ICD-10-CM | POA: Diagnosis not present

## 2019-12-30 LAB — LIPID PANEL
Chol/HDL Ratio: 2.4 ratio (ref 0.0–5.0)
Cholesterol, Total: 135 mg/dL (ref 100–199)
HDL: 56 mg/dL (ref >39)
LDL Chol Calc (NIH): 61 mg/dL (ref 0–99)
Triglycerides: 94 mg/dL (ref 0–149)
VLDL Cholesterol Cal: 18 mg/dL (ref 5–40)

## 2019-12-30 LAB — COMPREHENSIVE METABOLIC PANEL
ALT: 19 IU/L (ref 0–44)
AST: 30 IU/L (ref 0–40)
Albumin/Globulin Ratio: 1.6 (ref 1.2–2.2)
Albumin: 4.1 g/dL (ref 3.8–4.8)
Alkaline Phosphatase: 59 IU/L (ref 44–121)
BUN/Creatinine Ratio: 18 (ref 10–24)
BUN: 23 mg/dL (ref 8–27)
Bilirubin Total: 0.7 mg/dL (ref 0.0–1.2)
CO2: 26 mmol/L (ref 20–29)
Calcium: 9 mg/dL (ref 8.6–10.2)
Chloride: 105 mmol/L (ref 96–106)
Creatinine, Ser: 1.27 mg/dL (ref 0.76–1.27)
GFR calc Af Amer: 67 mL/min/{1.73_m2} (ref >59)
GFR calc non Af Amer: 58 mL/min/{1.73_m2} — ABNORMAL LOW (ref >59)
Globulin, Total: 2.6 g/dL (ref 1.5–4.5)
Glucose: 90 mg/dL (ref 65–99)
Potassium: 4.8 mmol/L (ref 3.5–5.2)
Sodium: 140 mmol/L (ref 134–144)
Total Protein: 6.7 g/dL (ref 6.0–8.5)

## 2020-01-07 NOTE — Progress Notes (Signed)
Subjective:    Patient ID: Joseph Booker, male    DOB: 11/07/1950, 69 y.o.   MRN: [id number]  HPI The patient is here for an acute visit.   Tingling/ numbness in left leg -  It started 4-5 days ago. No injury or new activities.  It is the whole leg from the groin to the bottom of his foot.  He thinks it has gotten a little worse.   He does not notice it much with sitting or walking.  It is more uncomfortable at night when sleeping and has affected his sleep.    He denies any pain or weakness in the leg. No lower back pain.  He denies lower back issues.    He walks 3-4 miles 2-3 times a week and the leg feels normal when he is not thinking about it - it does not feel painful and there is no noticeable N/T.    He denies any swelling in the legs.  Medications and allergies reviewed with patient and updated if appropriate.  Patient Active Problem List   Diagnosis Date Noted  . Right inguinal hernia 05/31/2017  . Cough 04/14/2017  . Hyperglycemia 02/01/2017  . Change in stool 09/27/2016  . Herpes zoster without complication 99/37/1696  . Glaucoma 08/21/2013  . Mitral regurgitation 05/23/2013  . Ejection fraction   . Shortness of breath 05/13/2013  . Dilated aortic root ([place]) 05/13/2013  . Pectus excavatum 05/13/2013  . Numbness and tingling in hands 05/13/2013  . BPH (benign prostatic hypertrophy) 07/06/2011  . CAD (coronary artery disease)   . Hyperlipidemia 06/12/2009  . VITAMIN D DEFICIENCY 06/04/2009  . ALLERGIC RHINITIS 06/04/2009  . GERD 06/04/2009  . ANXIETY STATE, UNSPECIFIED 05/15/2009  . Osteopenia 05/15/2009  . Ulcerative colitis ([place]) 05/17/2007    Current Outpatient Medications on File Prior to Visit  Medication Sig Dispense Refill  . aspirin 81 MG tablet Take 81 mg by mouth daily.    . clopidogrel (PLAVIX) 75 MG tablet TAKE 1 TABLET (75 MG TOTAL) BY MOUTH DAILY. 90 tablet 2  . Coenzyme Q10 (CO Q-10) 300 MG CAPS Take 1 tablet by mouth every morning.     Marland Kitchen  doxycycline (MONODOX) 50 MG capsule Take 50 mg by mouth daily.     . Evolocumab (REPATHA SURECLICK) 789 MG/ML SOAJ Inject 140 mg into the skin every 14 (fourteen) days. 2 pen 11  . mesalamine (APRISO) 0.375 g 24 hr capsule TAKE 4 CAPSULES BY MOUTH EVERY DAY 360 capsule 2  . Multiple Vitamin (MULTIVITAMIN) tablet Take 1 tablet by mouth daily.    . timolol (TIMOPTIC) 0.5 % ophthalmic solution Place 1 drop into both eyes 2 (two) times daily.      No current facility-administered medications on file prior to visit.    Past Medical History:  Diagnosis Date  . Allergic rhinitis   . Anxiety state, unspecified   . CAD (coronary artery disease)    [name] / nuclear 2005, excellent tolerance no scar or ischemia, EF 51% / nuclear, February, 2012, no scar or ischemia / catheterization March 26, 2010.. 10% in-stent restenosis, minimal other nonobstructive coronary disease, ejection fraction 55%., excellent result; ETT 9/13: ex 13:14, no CP, no ischemic ECG changes  . Glaucoma, left eye   . History of exercise intolerance 05/23/2013   cardiopulmonary exercise test-- normal functional capacity when compared to matched sedentary norms, borderline ventilatory limitation with the patient's exercise tidal volume reaching 90% of the measured inspiratory capacity ( pt reports  no dyspnea), did not appear to be circulatory limitation, normal CPX test  . Hyperlipidemia   . Nocturia   . Osteopenia   . Thoracic ascending aortic aneurysm [facility])    followed by [name]---  last CT 12-29-2016 ,  4.5cm  . Ulcerative colitis, universal ([place])    GI-- [name]--  dx 1990s  (per note mild Mayo Score 1)  . Wears glasses     Past Surgical History:  Procedure Laterality Date  . CARDIAC CATHETERIZATION  03/26/2010    dr Dian Queen   single vessel CAD w/ patent mLAD stent w/ 10% in-stent restenosis w/ mild to moderate disease in the remainder LAD w/ no lesions that appearedto be flow-limitinf/  normal LVFSF, ef  55%  . CARDIOVASCULAR STRESS TEST  06-30-2016   [name]   Low risk nuclear study w/ no evidence ishcemia/  normal LV function and wall motion,  nuclear stress ef 55%  . COLONOSCOPY  last one 10-27-2016  . CORONARY ANGIOPLASTY WITH STENT PLACEMENT  07-25-2002   [name]   PTCA w/  DES x1 to mLAD  . INGUINAL HERNIA REPAIR Left 2006  . INGUINAL HERNIA REPAIR Right 07/03/2017   Procedure: OPEN REPAIR OF RIGHT INGUINAL HERNIA WITH MESH;  Surgeon: Kinsinger, Arta Bruce, MD;  Location: [place];  Service: General;  Laterality: Right;  . INSERTION OF MESH Right 07/03/2017   Procedure: INSERTION OF MESH;  Surgeon: Kinsinger, Arta Bruce, MD;  Location: [facility];  Service: General;  Laterality: Right;  . SEPTOPLASTY  1987   w/ RHINOPLASTY  . TONSILLECTOMY  1959  . TRANSTHORACIC ECHOCARDIOGRAM  06-30-2016     [name]   ef 55-60%/  trivial AR/ mild dilated ascending aorta, 72m/  trivial MR/  mild PR and TR     Social History   Socioeconomic History  . Marital status: Married    Spouse name: Not on file  . Number of children: 1  . Years of education: Not on file  . Highest education level: Not on file  Occupational History  . Occupation: retired    EF[employer] farm manager RETIRED  Tobacco Use  . Smoking status: Never Smoker  . Smokeless tobacco: Never Used  . Tobacco comment: Married, 1 stepson, 5 g-kids. 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 Vaping Use: Never used  Substance and Sexual Activity  . Alcohol use: Yes    Comment: occasional wine  . Drug use: No  . Sexual activity: Yes    Partners: Female  Other Topics Concern  . Not on file  Social History Narrative   Exercising regularly: walks at least 5 miles a day, 6 days a week   Social Determinants of Health   Financial Resource Strain: Not on file  Food Insecurity: Not on file  Transportation Needs: Not on file  Physical Activity: Not on file  Stress: Not on file  Social  Connections: Not on file    Family History  Problem Relation Age of Onset  . Diabetes Mother   . Heart disease Mother   . Lung disease Mother        ILD  . Rheum arthritis Mother   . CAD Mother   . Heart disease Father 548      MI  . Heart attack Father   . Asthma Sister   . Colon cancer Neg Hx   . Esophageal cancer Neg Hx   . Pancreatic cancer Neg Hx   .  Kidney disease Neg Hx   . Liver disease Neg Hx     Review of Systems  Constitutional: Negative for chills and fever.  HENT: Negative for trouble swallowing.   Eyes: Positive for visual disturbance (Following with 2 different ophthalmologists).  Respiratory: Negative for cough, shortness of breath and wheezing.   Cardiovascular: Negative for chest pain, palpitations and leg swelling.  Musculoskeletal: Negative for back pain and myalgias.  Skin: Negative for color change and rash.  Neurological: Positive for numbness. Negative for dizziness, speech difficulty, weakness, light-headedness and headaches.       Objective:   Vitals:   01/08/20 1055  BP: 132/78  Pulse: (!) 56  Temp: 98.4 F (36.9 C)  SpO2: 97%   BP Readings from Last 3 Encounters:  01/08/20 132/78  09/13/19 133/86  04/15/19 132/84   Wt Readings from Last 3 Encounters:  01/08/20 154 lb (69.9 kg)  09/13/19 145 lb (65.8 kg)  04/15/19 153 lb 3.2 oz (69.5 kg)   Body mass index is 23.08 kg/m.   Physical Exam Constitutional:      General: He is not in acute distress.    Appearance: Normal appearance. He is not ill-appearing.  HENT:     Head: Normocephalic and atraumatic.  Cardiovascular:     Pulses: Normal pulses.  Musculoskeletal:        General: No tenderness (No lumbar spine or lower back tenderness.  No calf or leg tenderness palpation).     Right lower leg: No edema.     Left lower leg: No edema.  Skin:    General: Skin is warm and dry.     Findings: No erythema or rash.  Neurological:     General: No focal deficit present.     Mental  Status: He is alert and oriented to person, place, and time.     Sensory: No sensory deficit (Both legs feel the same with light touch).     Motor: No weakness.     Gait: Gait normal.     Deep Tendon Reflexes: Reflexes normal.            Assessment & Plan:    See Problem List for Assessment and Plan of chronic medical problems.    This visit occurred during the SARS-CoV-2 public health emergency.  Safety protocols were in place, including screening questions prior to the visit, additional usage of staff PPE, and extensive cleaning of exam room while observing appropriate contact time as indicated for disinfecting solutions.

## 2020-01-08 ENCOUNTER — Ambulatory Visit (INDEPENDENT_AMBULATORY_CARE_PROVIDER_SITE_OTHER): Payer: Medicare Other | Admitting: Internal Medicine

## 2020-01-08 ENCOUNTER — Encounter: Payer: Self-pay | Admitting: Internal Medicine

## 2020-01-08 ENCOUNTER — Other Ambulatory Visit: Payer: Self-pay

## 2020-01-08 VITALS — BP 132/78 | HR 56 | Temp 98.4°F | Ht 68.5 in | Wt 154.0 lb

## 2020-01-08 DIAGNOSIS — R200 Anesthesia of skin: Secondary | ICD-10-CM

## 2020-01-08 DIAGNOSIS — R739 Hyperglycemia, unspecified: Secondary | ICD-10-CM

## 2020-01-08 DIAGNOSIS — I2510 Atherosclerotic heart disease of native coronary artery without angina pectoris: Secondary | ICD-10-CM | POA: Diagnosis not present

## 2020-01-08 LAB — HEMOGLOBIN A1C: Hgb A1c MFr Bld: 5.3 % (ref 4.6–6.5)

## 2020-01-08 NOTE — Patient Instructions (Signed)
  Blood work was ordered.     Medications changes include :   none   A referral was ordered for neurology - Dr Tomi Likens.      Someone from their office will call you to schedule an appointment.     If your symptoms worsen prior to seeing Dr Tomi Likens please let me know.

## 2020-01-08 NOTE — Assessment & Plan Note (Signed)
Acute Left leg numbness/tingling started 4-5 days ago without injury or new activity No associated pain or weakness, back pain He states it feels like the entire leg is numb/tingly-no discrete nerve pattern.  Left buttock cheek did feel slightly different--felt some numbness/tingling in that area Somewhat atypical presentation radiculopathy Pulses normal, no swelling-this does not sound vascular ?  Radiculopathy versus other neurological disorder Will refer to neurology At this time no medication is needed, but advised him to let me know if his symptoms worsen prior to seeing neurology

## 2020-01-08 NOTE — Assessment & Plan Note (Signed)
Chronic Sugar at home sometimes slightly elevated a1c today reassured sugars are not the cause of his symptoms today

## 2020-01-13 ENCOUNTER — Encounter: Payer: Self-pay | Admitting: Neurology

## 2020-02-05 DIAGNOSIS — H401122 Primary open-angle glaucoma, left eye, moderate stage: Secondary | ICD-10-CM | POA: Diagnosis not present

## 2020-02-19 ENCOUNTER — Ambulatory Visit (INDEPENDENT_AMBULATORY_CARE_PROVIDER_SITE_OTHER): Payer: Medicare Other | Admitting: Gastroenterology

## 2020-02-19 ENCOUNTER — Encounter: Payer: Self-pay | Admitting: Gastroenterology

## 2020-02-19 ENCOUNTER — Telehealth: Payer: Self-pay | Admitting: *Deleted

## 2020-02-19 VITALS — BP 142/90 | HR 54 | Ht 68.5 in | Wt 151.2 lb

## 2020-02-19 DIAGNOSIS — K5190 Ulcerative colitis, unspecified, without complications: Secondary | ICD-10-CM | POA: Diagnosis not present

## 2020-02-19 MED ORDER — SUTAB 1479-225-188 MG PO TABS: ORAL_TABLET | ORAL | 0 refills | Status: DC

## 2020-02-19 MED ORDER — ONDANSETRON HCL 4 MG PO TABS: ORAL_TABLET | ORAL | 0 refills | Status: DC

## 2020-02-19 NOTE — Telephone Encounter (Signed)
   Primary Cardiologist: Skeet Latch, MD  Chart reviewed and patient contacted today y phone as part of pre-operative protocol coverage. Given past medical history and time since last visit, based on ACC/AHA guidelines, Joseph Booker would be at acceptable risk for the planned procedure without further cardiovascular testing.   OK to hold Plavix 5 days pre op and resume as soon a safe post op.  The patient was advised that if he develops new symptoms prior to surgery to contact our office to arrange for a follow-up visit, and he verbalized understanding.  I will route this recommendation to the requesting party via Epic fax function and remove from pre-op pool.  Please call with questions.  Kerin Ransom, PA-C 02/19/2020, 4:45 PM

## 2020-02-19 NOTE — Telephone Encounter (Signed)
Dr Oval Linsey we need clearance to hold Plavix in this patient prior to colonoscopy.  He had a PCI with DES in '04.  Kerin Ransom PA-C 02/19/2020 10:51 AM

## 2020-02-19 NOTE — Patient Instructions (Addendum)
You will be contacted by our office prior to your procedure for directions on holding your Plavix.  If you do not hear from our office 1 week prior to your scheduled procedure, please call [phone] to discuss.   You have been scheduled for a colonoscopy. Please follow written instructions given to you at your visit today.  Please pick up your prep supplies at the pharmacy within the next 1-3 days. If you use inhalers (even only as needed), please bring them with you on the day of your procedure.   We will refill your mesalamine for you today   We will send Zofran to your pharmacy for you to take 1 30 minutes before the start of the prep the evening before and take 1 30 minutes before the second part of your prep. We will send 5 tablets to your pharmacy for use the other 3 as needed   Follow up in 1 year  Due to recent changes in healthcare laws, you may see the results of your imaging and laboratory studies on MyChart before your provider has had a chance to review them.  We understand that in some cases there may be results that are confusing or concerning to you. Not all laboratory results come back in the same time frame and the provider may be waiting for multiple results in order to interpret others.  Please give [place] 48 hours in order for your provider to thoroughly review all the results before contacting the office for clarification of your results.   Thank you for choosing [facility]  Karleen Hampshire Nand[place]am,MD

## 2020-02-19 NOTE — Telephone Encounter (Signed)
[facility] Pre-operative Risk Assessment     Request for surgical clearance:     Endoscopy Procedure  What type of surgery is being performed?     Colonoscopy  When is this surgery scheduled?     04/17/2020  What type of clearance is required ?   Pharmacy  Are there any medications that need to be held prior to surgery and how long? Plavix 5 days   Practice name and name of physician performing surgery?      [facility]   [name]   What is your office phone and fax number?      Phone- [phone]  [phone]  Anesthesia type (None, local, MAC, general) ?       MAC

## 2020-02-19 NOTE — Progress Notes (Signed)
Joseph Booker    [id number]    July 19, 1950  Primary Care Physician:Burns, Claudina Lick, MD  Referring Physician: Binnie Rail, MD [place],  [place] [address]   Chief complaint: Ulcerative colitis  HPI:  84 yr M with ulcerative colitis diagnosed in his 13s is here for follow-up visit to discuss surveillance colonoscopy.  He is doing well overall. Denies any nausea, vomiting, abdominal pain, melena or bright red blood per rectum  He has been off Imuran since 2016.  He is currently on mesalamine daily  Colonoscopy July 2016: Biopsy showed inactive colitis Colonoscopy October 20, 2016: Showed mild inactive colitis  He is status post hemorrhoidal band ligation.  Outpatient Encounter Medications as of 02/19/2020  Medication Sig  . aspirin 81 MG tablet Take 81 mg by mouth daily.  . clopidogrel (PLAVIX) 75 MG tablet TAKE 1 TABLET (75 MG TOTAL) BY MOUTH DAILY.  Marland Kitchen Coenzyme Q10 (CO Q-10) 300 MG CAPS Take 1 tablet by mouth every morning.   Marland Kitchen doxycycline (MONODOX) 50 MG capsule Take 50 mg by mouth daily.   . Evolocumab (REPATHA SURECLICK) 250 MG/ML SOAJ Inject 140 mg into the skin every 14 (fourteen) days.  . mesalamine (APRISO) 0.375 g 24 hr capsule TAKE 4 CAPSULES BY MOUTH EVERY DAY  . Multiple Vitamin (MULTIVITAMIN) tablet Take 1 tablet by mouth daily.  . timolol (TIMOPTIC) 0.5 % ophthalmic solution Place 1 drop into both eyes 2 (two) times daily.    No facility-administered encounter medications on file as of 02/19/2020.    Allergies as of 02/19/2020 - Review Complete 02/19/2020  Allergen Reaction Noted  . Atorvastatin Other (See Comments) 04/04/2019    Past Medical History:  Diagnosis Date  . Allergic rhinitis   . Anxiety state, unspecified   . CAD (coronary artery disease)    [name] / nuclear 2005, excellent tolerance no scar or ischemia, EF 51% / nuclear, February, 2012, no scar or ischemia / catheterization March 26, 2010.. 10% in-stent  restenosis, minimal other nonobstructive coronary disease, ejection fraction 55%., excellent result; ETT 9/13: ex 13:14, no CP, no ischemic ECG changes  . Glaucoma, left eye   . History of exercise intolerance 05/23/2013   cardiopulmonary exercise test-- normal functional capacity when compared to matched sedentary norms, borderline ventilatory limitation with the patient's exercise tidal volume reaching 90% of the measured inspiratory capacity ( pt reports no dyspnea), did not appear to be circulatory limitation, normal CPX test  . Hyperlipidemia   . Nocturia   . Osteopenia   . Thoracic ascending aortic aneurysm [facility])    followed by [name]---  last CT 12-29-2016 ,  4.5cm  . Ulcerative colitis, universal ([place])    GI-- [name]--  dx 1990s  (per note mild Mayo Score 1)  . Wears glasses     Past Surgical History:  Procedure Laterality Date  . CARDIAC CATHETERIZATION  03/26/2010    dr Dian Queen   single vessel CAD w/ patent mLAD stent w/ 10% in-stent restenosis w/ mild to moderate disease in the remainder LAD w/ no lesions that appearedto be flow-limitinf/  normal LVFSF, ef 55%  . CARDIOVASCULAR STRESS TEST  06-30-2016   [name]   Low risk nuclear study w/ no evidence ishcemia/  normal LV function and wall motion,  nuclear stress ef 55%  . COLONOSCOPY  last one 10-27-2016  . CORONARY ANGIOPLASTY WITH STENT PLACEMENT  07-25-2002   [name]  PTCA w/  DES x1 to mLAD  . INGUINAL HERNIA REPAIR Left 2006  . INGUINAL HERNIA REPAIR Right 07/03/2017   Procedure: OPEN REPAIR OF RIGHT INGUINAL HERNIA WITH MESH;  Surgeon: Kinsinger, Arta Bruce, MD;  Location: [place];  Service: General;  Laterality: Right;  . INSERTION OF MESH Right 07/03/2017   Procedure: INSERTION OF MESH;  Surgeon: Kinsinger, Arta Bruce, MD;  Location: [facility];  Service: General;  Laterality: Right;  . SEPTOPLASTY  1987   w/ RHINOPLASTY  . TONSILLECTOMY  1959  . TRANSTHORACIC  ECHOCARDIOGRAM  06-30-2016     [name]   ef 55-60%/  trivial AR/ mild dilated ascending aorta, 36m/  trivial MR/  mild PR and TR     Family History  Problem Relation Age of Onset  . Diabetes Mother   . Heart disease Mother   . Lung disease Mother        ILD  . Rheum arthritis Mother   . CAD Mother   . Heart disease Father 567      MI  . Heart attack Father   . Asthma Sister   . Colon cancer Neg Hx   . Esophageal cancer Neg Hx   . Pancreatic cancer Neg Hx   . Kidney disease Neg Hx   . Liver disease Neg Hx     Social History   Socioeconomic History  . Marital status: Married    Spouse name: Not on file  . Number of children: 1  . Years of education: Not on file  . Highest education level: Not on file  Occupational History  . Occupation: retired    EFish farm manager RETIRED  Tobacco Use  . Smoking status: Never Smoker  . Smokeless tobacco: Never Used  . Tobacco comment: Married, 1 stepson, 5 g-kids. 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 Vaping Use: Never used  Substance and Sexual Activity  . Alcohol use: Yes    Comment: occasional wine  . Drug use: No  . Sexual activity: Yes    Partners: Female  Other Topics Concern  . Not on file  Social History Narrative   Exercising regularly: walks at least 5 miles a day, 6 days a week   Social Determinants of Health   Financial Resource Strain: Not on file  Food Insecurity: Not on file  Transportation Needs: Not on file  Physical Activity: Not on file  Stress: Not on file  Social Connections: Not on file  Intimate Partner Violence: Not on file      Review of systems: All other review of systems negative except as mentioned in the HPI.   Physical Exam: Vitals:   02/19/20 0950  BP: (!) 142/90  Pulse: (!) 54   Body mass index is 22.66 kg/m. Gen:      No acute distress Psych: normal mood and affect  Data Reviewed:  Reviewed labs, radiology imaging, old records and pertinent past GI work  up   Assessment and Plan/Recommendations:  70year old male with chronic ulcerative colitis  Is due for surveillance colonoscopy, will schedule it The risks and benefits as well as alternatives of endoscopic procedure(s) have been discussed and reviewed. All questions answered. The patient agrees to proceed.  Continue mesalamine 1.5 g daily Avoid NSAIDs  Return in 1 year or sooner if needed   The patient was provided an opportunity to ask questions and all were answered. The patient agreed with the plan and demonstrated an  understanding of the instructions.  Damaris Hippo , MD    CC: Binnie Rail, MD

## 2020-02-20 ENCOUNTER — Other Ambulatory Visit: Payer: Self-pay | Admitting: Gastroenterology

## 2020-02-21 DIAGNOSIS — D2271 Melanocytic nevi of right lower limb, including hip: Secondary | ICD-10-CM | POA: Diagnosis not present

## 2020-02-21 DIAGNOSIS — D2272 Melanocytic nevi of left lower limb, including hip: Secondary | ICD-10-CM | POA: Diagnosis not present

## 2020-02-21 DIAGNOSIS — D1801 Hemangioma of skin and subcutaneous tissue: Secondary | ICD-10-CM | POA: Diagnosis not present

## 2020-02-21 DIAGNOSIS — L820 Inflamed seborrheic keratosis: Secondary | ICD-10-CM | POA: Diagnosis not present

## 2020-02-21 DIAGNOSIS — L821 Other seborrheic keratosis: Secondary | ICD-10-CM | POA: Diagnosis not present

## 2020-02-21 DIAGNOSIS — L570 Actinic keratosis: Secondary | ICD-10-CM | POA: Diagnosis not present

## 2020-02-21 DIAGNOSIS — L814 Other melanin hyperpigmentation: Secondary | ICD-10-CM | POA: Diagnosis not present

## 2020-02-21 DIAGNOSIS — L738 Other specified follicular disorders: Secondary | ICD-10-CM | POA: Diagnosis not present

## 2020-02-21 DIAGNOSIS — D225 Melanocytic nevi of trunk: Secondary | ICD-10-CM | POA: Diagnosis not present

## 2020-03-10 NOTE — Progress Notes (Signed)
NEUROLOGY CONSULTATION NOTE  Joseph Booker MRN: [id number] DOB: [birth year]  Referring provider: Billey Gosling, MD Primary care provider: Billey Gosling, MD  Reason for consult:  Left leg numbness/tingling  Assessment/Plan:   Left leg paresthesias - At this time, I have no explanation for his symptoms.  As it involves the entire leg, it does not correlate with a single lumbosacral radiculopathy but suggests either spinal cord or brain involvement.  No swelling, discoloration or absence of pedal pulse to suggest DVT or PAD.  May also consider pelvic mass as well.  However, he doesn't exhibit any objective findings on exam such as asymmetric reflexes, focal weakness or spasticity.  It is not consistent with a polyneuropathy.  1.  We will first check MRI of cervical and thoracic spine with and without contrast.  If unremarkable, would check MRI of brain.  If unremarkable consider MRI of lumbar spine and possibly pelvis. 2.  Follow up after testing.   Subjective:  Joseph Booker is a 70 year old right-handed male with CAD, glaucoma, and ulcerative colitis who presents for left leg numbness and tingling.  History supplemented by referring provider's note.  In December he started experiencing numbness and tingling of his entire left leg from the bottom of the foot up to the groin.  No preceding injury or strenuous activity.  No associated low back pain, bowel/bladder dysfunction, weakness, swelling, discoloration or leg pain.  He notices it mostly laying in bed.  He now states it is more prominent on the sole of his foot but maybe it is because he has gotten use to the numbness in the leg.  He has some chronic RIGHT-sided groin pain due to history of complicated right hernia repair several years ago.   Labs from December include normal Hgb A1c of 5.3 and normal CMP except for borderline low GFR 58  PAST MEDICAL HISTORY: Past Medical History:  Diagnosis Date  . Allergic rhinitis   . Anxiety state,  unspecified   . CAD (coronary artery disease)    [name] / nuclear 2005, excellent tolerance no scar or ischemia, EF 51% / nuclear, February, 2012, no scar or ischemia / catheterization March 26, 2010.. 10% in-stent restenosis, minimal other nonobstructive coronary disease, ejection fraction 55%., excellent result; ETT 9/13: ex 13:14, no CP, no ischemic ECG changes  . Glaucoma, left eye   . History of exercise intolerance 05/23/2013   cardiopulmonary exercise test-- normal functional capacity when compared to matched sedentary norms, borderline ventilatory limitation with the patient's exercise tidal volume reaching 90% of the measured inspiratory capacity ( pt reports no dyspnea), did not appear to be circulatory limitation, normal CPX test  . Hyperlipidemia   . Nocturia   . Osteopenia   . Thoracic ascending aortic aneurysm [facility])    followed by [name]---  last CT 12-29-2016 ,  4.5cm  . Ulcerative colitis, universal ([place])    GI-- [name]--  dx 1990s  (per note mild Mayo Score 1)  . Wears glasses     PAST SURGICAL HISTORY: Past Surgical History:  Procedure Laterality Date  . CARDIAC CATHETERIZATION  03/26/2010    dr Dian Queen   single vessel CAD w/ patent mLAD stent w/ 10% in-stent restenosis w/ mild to moderate disease in the remainder LAD w/ no lesions that appearedto be flow-limitinf/  normal LVFSF, ef 55%  . CARDIOVASCULAR STRESS TEST  06-30-2016   [name]   Low risk nuclear study w/ no evidence ishcemia/  normal LV function and  wall motion,  nuclear stress ef 55%  . COLONOSCOPY  last one 10-27-2016  . CORONARY ANGIOPLASTY WITH STENT PLACEMENT  07-25-2002   [name]   PTCA w/  DES x1 to mLAD  . INGUINAL HERNIA REPAIR Left 2006  . INGUINAL HERNIA REPAIR Right 07/03/2017   Procedure: OPEN REPAIR OF RIGHT INGUINAL HERNIA WITH MESH;  Surgeon: Kinsinger, Arta Bruce, MD;  Location: [place];  Service: General;  Laterality: Right;  . INSERTION OF MESH  Right 07/03/2017   Procedure: INSERTION OF MESH;  Surgeon: Kinsinger, Arta Bruce, MD;  Location: [facility];  Service: General;  Laterality: Right;  . SEPTOPLASTY  1987   w/ RHINOPLASTY  . TONSILLECTOMY  1959  . TRANSTHORACIC ECHOCARDIOGRAM  06-30-2016     [name]   ef 55-60%/  trivial AR/ mild dilated ascending aorta, 43m/  trivial MR/  mild PR and TR     MEDICATIONS: Current Outpatient Medications on File Prior to Visit  Medication Sig Dispense Refill  . aspirin 81 MG tablet Take 81 mg by mouth daily.    . clopidogrel (PLAVIX) 75 MG tablet TAKE 1 TABLET (75 MG TOTAL) BY MOUTH DAILY. 90 tablet 2  . Coenzyme Q10 (CO Q-10) 300 MG CAPS Take 1 tablet by mouth every morning.     .Marland Kitchendoxycycline (MONODOX) 50 MG capsule Take 50 mg by mouth daily.     . Evolocumab (REPATHA SURECLICK) 1062MG/ML SOAJ Inject 140 mg into the skin every 14 (fourteen) days. 2 pen 11  . mesalamine (APRISO) 0.375 g 24 hr capsule TAKE 4 CAPSULES BY MOUTH EVERY DAY 360 capsule 2  . Multiple Vitamin (MULTIVITAMIN) tablet Take 1 tablet by mouth daily.    . ondansetron (ZOFRAN) 4 MG tablet Per colonoscopy prep instructions 5 tablet 0  . Sodium Sulfate-Mag Sulfate-KCl (SUTAB) 1901-068-1206MG TABS Per colon prep instructions   MANUFACTURER CODES!! BIN: [id number]: CN GROUP: [id number] ID: [id number];[employer] INS[employer] ;NO PRIOR AUTHORIZATION 24 tablet 0  . timolol (TIMOPTIC) 0.5 % ophthalmic solution Place 1 drop into both eyes 2 (two) times daily.      No current facility-administered medications on file prior to visit.    ALLERGIES: Allergies  Allergen Reactions  . Atorvastatin Other (See Comments)    transaminits Transaminits, causes liver enzymes to go up    FAMILY HISTORY: Family History  Problem Relation Age of Onset  . Diabetes Mother   . Heart disease Mother   . Lung disease Mother        ILD  . Rheum arthritis Mother   . CAD Mother   . Heart disease Father 578      MI   . Heart attack Father   . Asthma Sister   . Colon cancer Neg Hx   . Esophageal cancer Neg Hx   . Pancreatic cancer Neg Hx   . Kidney disease Neg Hx   . Liver disease Neg Hx     SOCIAL HISTORY: Social History   Socioeconomic History  . Marital status: Married    Spouse name: Not on file  . Number of children: 1  . Years of education: Not on file  . Highest education level: Not on file  Occupational History  . Occupation: retired    EFish farm manager RETIRED  Tobacco Use  . Smoking status: Never Smoker  . Smokeless tobacco: Never Used  . Tobacco comment: Married, 1 stepson, 5 g-kids. Retired aEmergency planning/management officer now iHerbalist  Vaping Use  . Vaping Use: Never used  Substance and Sexual Activity  . Alcohol use: Yes    Comment: occasional wine  . Drug use: No  . Sexual activity: Yes    Partners: Female  Other Topics Concern  . Not on file  Social History Narrative   Exercising regularly: walks at least 5 miles a day, 6 days a week   Social Determinants of Health   Financial Resource Strain: Not on file  Food Insecurity: Not on file  Transportation Needs: Not on file  Physical Activity: Not on file  Stress: Not on file  Social Connections: Not on file  Intimate Partner Violence: Not on file    Objective:  Blood pressure 140/77, pulse (!) 54, height 5' 8"  (1.727 m), weight 154 lb (69.9 kg), SpO2 98 %. General: No acute distress.  Patient appears well-groomed.   Head:  Normocephalic/atraumatic Eyes:  fundi examined but not visualized Neck: supple, no paraspinal tenderness, full range of motion Back: No paraspinal tenderness Heart: regular rate and rhythm Lungs: Clear to auscultation bilaterally. Vascular: No carotid bruits. Neurological Exam: Mental status: alert and oriented to person, place, and time, recent and remote memory intact, fund of knowledge intact, attention and concentration intact, speech fluent and not dysarthric, language intact. Cranial nerves: CN I:  not tested CN II: pupils equal, round and reactive to light, visual fields intact CN III, IV, VI:  full range of motion, no nystagmus, no ptosis CN V: facial sensation intact. CN VII: upper and lower face symmetric CN VIII: hearing intact CN IX, X: gag intact, uvula midline CN XI: sternocleidomastoid and trapezius muscles intact CN XII: tongue midline Bulk & Tone: normal, no fasciculations. Motor:  muscle strength 5/5 throughout Sensation:  Pinprick, temperature and vibratory sensation intact. Deep Tendon Reflexes:  2+ throughout,  toes downgoing.   Finger to nose testing:  Without dysmetria.   Heel to shin:  Without dysmetria.   Gait:  Normal station and stride.  Romberg negative.    Thank you for allowing me to take part in the care of this patient.  Metta Clines, DO  CC: Billey Gosling, MD

## 2020-03-11 ENCOUNTER — Encounter: Payer: Self-pay | Admitting: Neurology

## 2020-03-11 ENCOUNTER — Other Ambulatory Visit: Payer: Self-pay

## 2020-03-11 ENCOUNTER — Ambulatory Visit (INDEPENDENT_AMBULATORY_CARE_PROVIDER_SITE_OTHER): Payer: Medicare Other | Admitting: Neurology

## 2020-03-11 VITALS — BP 140/77 | HR 54 | Ht 68.0 in | Wt 154.0 lb

## 2020-03-11 DIAGNOSIS — R200 Anesthesia of skin: Secondary | ICD-10-CM | POA: Diagnosis not present

## 2020-03-11 NOTE — Patient Instructions (Signed)
I don't have an answer to why you have the numbness as your neurologic exam is objectively normal. Plan: 1.  First we will check MRI of cervical and thoracic spine 2.  If unremarkable, then would check MRI of brain 3.  Follow up after testing

## 2020-03-27 ENCOUNTER — Ambulatory Visit: Payer: Medicare Other | Attending: Internal Medicine

## 2020-03-27 DIAGNOSIS — Z23 Encounter for immunization: Secondary | ICD-10-CM

## 2020-03-27 NOTE — Progress Notes (Signed)
   Covid-19 Vaccination Clinic  Name:  Joseph Booker    MRN: [id number] DOB: 10-Sep-1950  03/27/2020  [name] was observed post Covid-19 immunization for 15 minutes without incident. He was provided with Vaccine Information Sheet and instruction to access the V-Safe system.   [name] was instructed to call 911 with any severe reactions post vaccine: Marland Kitchen Difficulty breathing  . Swelling of face and throat  . A fast heartbeat  . A bad rash all over body  . Dizziness and weakness   Immunizations Administered    Name Date Dose VIS Date Route   PFIZER Comrnaty(Gray TOP) Covid-19 Vaccine 03/27/2020  9:06 AM 0.3 mL 12/26/2019 Intramuscular   Manufacturer: [employer], [employer]   Lot: [id number]   [place]: [phone]

## 2020-03-30 ENCOUNTER — Ambulatory Visit
Admission: RE | Admit: 2020-03-30 | Discharge: 2020-03-30 | Disposition: A | Discharge status: Home / Self Care | Payer: Medicare Other | Source: Ambulatory Visit | Attending: Neurology | Admitting: Neurology

## 2020-03-30 ENCOUNTER — Other Ambulatory Visit: Payer: Self-pay

## 2020-03-30 DIAGNOSIS — M50221 Other cervical disc displacement at C4-C5 level: Secondary | ICD-10-CM | POA: Diagnosis not present

## 2020-03-30 DIAGNOSIS — R200 Anesthesia of skin: Secondary | ICD-10-CM

## 2020-03-30 DIAGNOSIS — M5124 Other intervertebral disc displacement, thoracic region: Secondary | ICD-10-CM | POA: Diagnosis not present

## 2020-03-30 DIAGNOSIS — M5021 Other cervical disc displacement,  high cervical region: Secondary | ICD-10-CM | POA: Diagnosis not present

## 2020-03-30 DIAGNOSIS — M4802 Spinal stenosis, cervical region: Secondary | ICD-10-CM | POA: Diagnosis not present

## 2020-03-30 MED ORDER — GADOBENATE DIMEGLUMINE 529 MG/ML IV SOLN
14.0000 mL | Freq: Once | INTRAVENOUS | Status: AC | PRN
  Administered 2020-03-30: 14 mL via INTRAVENOUS

## 2020-03-31 NOTE — Progress Notes (Signed)
LMOVM please call our office back in regards to your lab work.

## 2020-04-01 ENCOUNTER — Telehealth: Payer: Self-pay

## 2020-04-01 DIAGNOSIS — R9089 Other abnormal findings on diagnostic imaging of central nervous system: Secondary | ICD-10-CM

## 2020-04-01 DIAGNOSIS — R9389 Abnormal findings on diagnostic imaging of other specified body structures: Secondary | ICD-10-CM

## 2020-04-01 DIAGNOSIS — R200 Anesthesia of skin: Secondary | ICD-10-CM

## 2020-04-01 NOTE — Progress Notes (Signed)
Pt advised of Mri resutls. Recommendation for a MRI of the Brain discussed and ordered.

## 2020-04-01 NOTE — Telephone Encounter (Signed)
-----   Message from Pieter Partridge, DO sent at 03/31/2020  6:43 AM EDT ----- MRI shows some arthritic changes in the spine but no abnormality in the spinal cord.  There is possible old stroke in the brain, likely incidental finding.  However, I would like to check MRI of brain with and without contrast anyway for further evaluation

## 2020-04-12 ENCOUNTER — Other Ambulatory Visit: Payer: Self-pay | Admitting: Cardiovascular Disease

## 2020-04-13 ENCOUNTER — Other Ambulatory Visit: Payer: Self-pay

## 2020-04-13 ENCOUNTER — Ambulatory Visit
Admission: RE | Admit: 2020-04-13 | Discharge: 2020-04-13 | Disposition: A | Discharge status: Home / Self Care | Payer: Medicare Other | Source: Ambulatory Visit | Attending: Neurology | Admitting: Neurology

## 2020-04-13 DIAGNOSIS — R9389 Abnormal findings on diagnostic imaging of other specified body structures: Secondary | ICD-10-CM

## 2020-04-13 DIAGNOSIS — R200 Anesthesia of skin: Secondary | ICD-10-CM

## 2020-04-13 MED ORDER — GADOBENATE DIMEGLUMINE 529 MG/ML IV SOLN
14.0000 mL | Freq: Once | INTRAVENOUS | Status: AC | PRN
  Administered 2020-04-13: 14 mL via INTRAVENOUS

## 2020-04-15 ENCOUNTER — Telehealth: Payer: Self-pay

## 2020-04-15 DIAGNOSIS — R200 Anesthesia of skin: Secondary | ICD-10-CM

## 2020-04-15 NOTE — Telephone Encounter (Signed)
LMOVM for pt to call back.   REferall added MRI Lumbar spine w/o Contrast

## 2020-04-15 NOTE — Telephone Encounter (Signed)
-----   Message from Pieter Partridge, DO sent at 04/14/2020  4:22 PM EDT ----- MRI of brain shows possible benig ncyst or normal variant enlargement of the spinal fluid space in the back of the brain - both benign and asymptomatic findings.  Nothing concerning that would be causing the left leg numbness.  I would like to proceed with MRI of lumbar spine without contrast to evaluate for left leg numbness.

## 2020-04-16 NOTE — Telephone Encounter (Signed)
Patient returned call after hours and requested a call back.

## 2020-04-16 NOTE — Telephone Encounter (Signed)
Pt advised of MRI Brain results and order for the MRI Lumbar spine w/o Contrast.

## 2020-04-17 ENCOUNTER — Other Ambulatory Visit: Payer: Self-pay

## 2020-04-17 ENCOUNTER — Ambulatory Visit (AMBULATORY_SURGERY_CENTER): Payer: Medicare Other | Admitting: Gastroenterology

## 2020-04-17 ENCOUNTER — Encounter: Payer: Self-pay | Admitting: Gastroenterology

## 2020-04-17 VITALS — BP 95/57 | HR 47 | Temp 97.3°F | Resp 24 | Ht 68.0 in | Wt 151.0 lb

## 2020-04-17 DIAGNOSIS — K5190 Ulcerative colitis, unspecified, without complications: Secondary | ICD-10-CM | POA: Diagnosis not present

## 2020-04-17 MED ORDER — SODIUM CHLORIDE 0.9 % IV SOLN: 500.0000 mL | Freq: Once | INTRAVENOUS | Status: DC

## 2020-04-17 MED ORDER — BUDESONIDE 3 MG PO CPEP: 9.0000 mg | ORAL_CAPSULE | Freq: Every day | ORAL | 3 refills | Status: DC

## 2020-04-17 NOTE — Patient Instructions (Signed)
YOU HAD AN ENDOSCOPIC PROCEDURE TODAY AT THE [facility]:   Refer to the procedure report that was given to you for any specific questions about what was found during the examination.  If the procedure report does not answer your questions, please call your gastroenterologist to clarify.  If you requested that your care partner not be given the details of your procedure findings, then the procedure report has been included in a sealed envelope for you to review at your convenience later.  YOU SHOULD EXPECT: Some feelings of bloating in the abdomen. Passage of more gas than usual.  Walking can help get rid of the air that was put into your GI tract during the procedure and reduce the bloating. If you had a lower endoscopy (such as a colonoscopy or flexible sigmoidoscopy) you may notice spotting of blood in your stool or on the toilet paper. If you underwent a bowel prep for your procedure, you may not have a normal bowel movement for a few days.  Please Note:  You might notice some irritation and congestion in your nose or some drainage.  This is from the oxygen used during your procedure.  There is no need for concern and it should clear up in a day or so.  SYMPTOMS TO REPORT IMMEDIATELY:   Following lower endoscopy (colonoscopy or flexible sigmoidoscopy):  Excessive amounts of blood in the stool  Significant tenderness or worsening of abdominal pains  Swelling of the abdomen that is new, acute  Fever of 100F or higher   Following upper endoscopy (EGD)  Vomiting of blood or coffee ground material  New chest pain or pain under the shoulder blades  Painful or persistently difficult swallowing  New shortness of breath  Fever of 100F or higher  Black, tarry-looking stools  For urgent or emergent issues, a gastroenterologist can be reached at any hour by calling [phone]. Do not use MyChart messaging for urgent concerns.    DIET:  We do recommend a small meal at first, but  then you may proceed to your regular diet.  Drink plenty of fluids but you should avoid alcoholic beverages for 24 hours.  ACTIVITY:  You should plan to take it easy for the rest of today and you should NOT DRIVE or use heavy machinery until tomorrow (because of the sedation medicines used during the test).    FOLLOW UP: Our staff will call the number listed on your records 48-72 hours following your procedure to check on you and address any questions or concerns that you may have regarding the information given to you following your procedure. If we do not reach you, we will leave a message.  We will attempt to reach you two times.  During this call, we will ask if you have developed any symptoms of COVID 19. If you develop any symptoms (ie: fever, flu-like symptoms, shortness of breath, cough etc.) before then, please call [phone].  If you test positive for Covid 19 in the 2 weeks post procedure, please call and report this information to us.    If any biopsies were taken you will be contacted by phone or by letter within the next 1-3 weeks.  Please call us at [phone] if you have not heard about the biopsies in 3 weeks.    SIGNATURES/CONFIDENTIALITY: You and/or your care partner have signed paperwork which will be entered into your electronic medical record.  These signatures attest to the fact that that the information above on   your After Visit Summary has been reviewed and is understood.  Full responsibility of the confidentiality of this discharge information lies with you and/or your care-partner. 

## 2020-04-17 NOTE — Op Note (Addendum)
[place] Patient Name: Rajendra Spiller Procedure Date: 04/17/2020 8:37 AM MRN: [id number] Endoscopist: Mauri Pole , MD Age: 70 Referring MD:  Date of Birth: [birth year] Gender: Male Account #: [id number] Procedure:                Colonoscopy Indications:              High risk colon cancer surveillance: Ulcerative                            pancolitis of 8 (or more) years duration Medicines:                Monitored Anesthesia Care Procedure:                Pre-Anesthesia Assessment:                           - Prior to the procedure, a History and Physical                            was performed, and patient medications and                            allergies were reviewed. The patient's tolerance of                            previous anesthesia was also reviewed. The risks                            and benefits of the procedure and the sedation                            options and risks were discussed with the patient.                            All questions were answered, and informed consent                            was obtained. Prior Anticoagulants: The patient                            last took Plavix (clopidogrel) 5 days prior to the                            procedure. ASA Grade Assessment: III - A patient                            with severe systemic disease. After reviewing the                            risks and benefits, the patient was deemed in                            satisfactory condition to undergo the procedure.  After obtaining informed consent, the colonoscope                            was passed under direct vision. Throughout the                            procedure, the patient's blood pressure, pulse, and                            oxygen saturations were monitored continuously. The                            Olympus PCF-H190DL (#[id number]) Colonoscope was                            introduced through  the anus and advanced to the the                            cecum, identified by appendiceal orifice and                            ileocecal valve. The colonoscopy was performed                            without difficulty. The patient tolerated the                            procedure well. The quality of the bowel                            preparation was adequate. The ileocecal valve,                            appendiceal orifice, and rectum were photographed. Scope In: 8:53:58 AM Scope Out: 9:12:58 AM Scope Withdrawal Time: 0 hours 7 minutes 17 seconds  Total Procedure Duration: 0 hours 19 minutes 0 seconds  Findings:                 The perianal and digital rectal examinations were                            normal.                           Inflammation characterized by altered vascularity,                            congestion (edema), erythema and scarring was found                            as medium patches surrounded by normal mucosa in                            the rectum, in the descending colon, in the splenic  flexure, in the transverse colon, in the ascending                            colon and at the cecum. This was mild in severity,                            and when compared to previous examinations, the                            findings are worsened. Biopsies were taken with a                            cold forceps for histology.                           Non-bleeding internal hemorrhoids were found during                            retroflexion. The hemorrhoids were medium-sized. Complications:            No immediate complications. Estimated Blood Loss:     Estimated blood loss was minimal. Impression:               - Pancolitis. Inflammation was found in the rectum,                            in the descending colon, in the splenic flexure, in                            the transverse colon, in the ascending colon and at                             the cecum. This was mild in severity, worsened                            compared to previous examinations. Biopsied.                           - Non-bleeding internal hemorrhoids. Recommendation:           - Patient has a contact number available for                            emergencies. The signs and symptoms of potential                            delayed complications were discussed with the                            patient. Return to normal activities tomorrow.                            Written discharge instructions were provided to the  patient.                           - Resume previous diet.                           - Continue present medications.                           - Await pathology results.                           - Repeat colonoscopy in 3 years for surveillance                            based on pathology results.                           - Return to GI clinic at the next available                            appointment. Please call to schedule appointment.                           - Use Entocort EC (budesonide) 9 mg PO one time per                            day for 3 months.                           - Resume Plavix (clopidogrel) at prior dose                            tomorrow. Refer to managing physician for further                            adjustment of therapy. Mauri Pole, MD 04/17/2020 9:18:56 AM This report has been signed electronically.

## 2020-04-17 NOTE — Progress Notes (Signed)
VS-NS

## 2020-04-17 NOTE — Progress Notes (Signed)
pt tolerated well. VSS. awake and to recovery. Report given to RN.  

## 2020-04-21 ENCOUNTER — Telehealth: Payer: Self-pay | Admitting: *Deleted

## 2020-04-21 NOTE — Telephone Encounter (Signed)
  Follow up Call-  Call back number 04/17/2020  Post procedure Call Back phone  # [phone]  Permission to leave phone message Yes  Some recent data might be hidden     Patient questions:  Do you have a fever, pain , or abdominal swelling? No. Pain Score  0 *  Have you tolerated food without any problems? Yes.    Have you been able to return to your normal activities? Yes.    Do you have any questions about your discharge instructions: Diet   No. Medications  No. Follow up visit  No.  Do you have questions or concerns about your Care? No.  Actions: * If pain score is 4 or above: No action needed, pain <4

## 2020-04-29 ENCOUNTER — Telehealth: Payer: Self-pay | Admitting: Cardiovascular Disease

## 2020-04-29 ENCOUNTER — Ambulatory Visit
Admission: RE | Admit: 2020-04-29 | Discharge: 2020-04-29 | Disposition: A | Discharge status: Home / Self Care | Payer: Medicare Other | Source: Ambulatory Visit | Attending: Cardiovascular Disease | Admitting: Cardiovascular Disease

## 2020-04-29 DIAGNOSIS — I712 Thoracic aortic aneurysm, without rupture, unspecified: Secondary | ICD-10-CM

## 2020-04-29 DIAGNOSIS — I700 Atherosclerosis of aorta: Secondary | ICD-10-CM | POA: Diagnosis not present

## 2020-04-29 DIAGNOSIS — I2510 Atherosclerotic heart disease of native coronary artery without angina pectoris: Secondary | ICD-10-CM | POA: Diagnosis not present

## 2020-04-29 MED ORDER — IOPAMIDOL (ISOVUE-370) INJECTION 76%
75.0000 mL | Freq: Once | INTRAVENOUS | Status: AC | PRN
  Administered 2020-04-29: 75 mL via INTRAVENOUS

## 2020-04-29 NOTE — Telephone Encounter (Signed)
Pt advised of CT results along with MD's recommendation. Pt verbalized understanding.      Berniece Salines, DO  04/29/2020 11:54 AM EDT      Your CT scan showed the aneurysm is still 4.5 cm. Compared to 2018 this has not increased in size. Please keep your appointment with Dr. Oval Linsey that scheduled for April 29.

## 2020-04-29 NOTE — Telephone Encounter (Signed)
Patient states he is returning a call. May be regarding his CT results.

## 2020-05-01 ENCOUNTER — Other Ambulatory Visit: Payer: Medicare Other

## 2020-05-04 ENCOUNTER — Other Ambulatory Visit: Payer: Self-pay | Admitting: Cardiovascular Disease

## 2020-05-04 NOTE — Telephone Encounter (Signed)
Rx has been sent to the pharmacy electronically. ° °

## 2020-05-08 ENCOUNTER — Ambulatory Visit: Payer: Medicare Other | Admitting: Physician Assistant

## 2020-05-11 ENCOUNTER — Other Ambulatory Visit: Payer: Self-pay

## 2020-05-11 ENCOUNTER — Ambulatory Visit
Admission: RE | Admit: 2020-05-11 | Discharge: 2020-05-11 | Disposition: A | Discharge status: Home / Self Care | Payer: Medicare Other | Source: Ambulatory Visit | Attending: Neurology | Admitting: Neurology

## 2020-05-11 DIAGNOSIS — M5127 Other intervertebral disc displacement, lumbosacral region: Secondary | ICD-10-CM | POA: Diagnosis not present

## 2020-05-11 DIAGNOSIS — R200 Anesthesia of skin: Secondary | ICD-10-CM

## 2020-05-11 DIAGNOSIS — M47816 Spondylosis without myelopathy or radiculopathy, lumbar region: Secondary | ICD-10-CM | POA: Diagnosis not present

## 2020-05-12 ENCOUNTER — Telehealth: Payer: Self-pay

## 2020-05-12 ENCOUNTER — Encounter: Payer: Self-pay | Admitting: Gastroenterology

## 2020-05-12 DIAGNOSIS — R200 Anesthesia of skin: Secondary | ICD-10-CM

## 2020-05-12 NOTE — Telephone Encounter (Signed)
Pt advised of his MRI of the lumbar spine. PT agrees to have a EMG preformed.   Front desk when you get a chance can you call and schedule. Per pt he needs a Wednesday.

## 2020-05-12 NOTE — Telephone Encounter (Signed)
-----   Message from Pieter Partridge, DO sent at 05/11/2020 11:09 AM EDT ----- MRI of lumbar spine shows mild arthritic changes but nothing pressing on the nerves that would cause left leg numbness.  I would like to check the nerves with a nerve study.  This is performed by my colleague, Dr. Posey Pronto.  It would involve placing electrodes on his arm and leg and giving him small electric shocks (it feels like a static shock) to test the nerves.  This is followed by placing a very narrow needle into muscles in his leg and arm to test the nerves that go to those muscles.  If agreeable, I would like to order NCV-EMG of left upper and lower extremities.

## 2020-05-13 ENCOUNTER — Encounter: Payer: Self-pay | Admitting: Physician Assistant

## 2020-05-13 ENCOUNTER — Ambulatory Visit (INDEPENDENT_AMBULATORY_CARE_PROVIDER_SITE_OTHER): Payer: Medicare Other | Admitting: Physician Assistant

## 2020-05-13 VITALS — BP 124/74 | HR 43

## 2020-05-13 DIAGNOSIS — K5190 Ulcerative colitis, unspecified, without complications: Secondary | ICD-10-CM | POA: Diagnosis not present

## 2020-05-13 NOTE — Patient Instructions (Addendum)
If you are age 68 or older, your body mass index should be between 23-30. Your There is no height or weight on file to calculate BMI. If this is out of the aforementioned range listed, please consider follow up with your Primary Care Provider.  If you are age 3 or younger, your body mass index should be between 19-25. Your There is no height or weight on file to calculate BMI. If this is out of the aformentioned range listed, please consider follow up with your Primary Care Provider.   Continue you Budesonide 3 mg 3 capsule daily for 1 more month, then decrease to 2 capsules for 1 month and the decrease to 1 capsule for 1 month.  Continue Mesalamine 0.375 grams 4 capsules daily.  You have been scheduled to follow up with Dr. Silverio Decamp on August 14, 2020 at 9:50 am  Thank you for entrusting me with your care and choosing [facility]  Amy Esterwood, PA-C

## 2020-05-13 NOTE — Progress Notes (Signed)
Subjective:    Patient ID: Joseph Booker, male    DOB: 10-Apr-1950, 70 y.o.   MRN: [id number]  HPI Joseph Booker is a 70 year old white male, established with Dr. 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  He had recent colonoscopy and comes in today for follow-up. 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 Colonoscopy was done on 04/17/2020 with finding of mild inflammation with altered vascularity, congestion, and erythema from the rectum through the ascending colon.  This was felt to be mild compared to prior exams.  Also noted to have internal hemorrhoids, no polyps.  Biopsies from the right colon showed mildly active colitis, biopsies from the left colon were negative, no dysplasia. He has been on Apriso 4 tablets daily for maintenance.  After the recent colonoscopy he was started on a course of budesonide 9 mg daily. He says he has not noticed much change in his symptoms since starting the budesonide.  He says he is usually having 2-3 bowel movements per day which is a little bit more frequent than his baseline but stools are formed, he has not noticed any bleeding.  He has some occasional mild urgency.  He had been having these similar symptoms prior to the colonoscopy, and the only thing he says is out of the ordinary is some slight increase in frequency of bowel movements over the past few months. Says he feels that he does have some mild lactose intolerance symptoms and tries to avoid lactose.  He had also been eating a lot of peanut butter and has stopped that and changed to a different nut butter but again no change in his sense of mild urgency.  Otherwise has been feeling well.  Review of Systems Pertinent positive and negative review of systems were noted in the above HPI section.  All other review of systems was otherwise negative.  Outpatient Encounter Medications as of  05/13/2020  Medication Sig  . aspirin 81 MG tablet Take 81 mg by mouth daily.  . budesonide (ENTOCORT EC) 3 MG 24 hr capsule Take 3 capsules (9 mg total) by mouth daily.  . clopidogrel (PLAVIX) 75 MG tablet TAKE 1 TABLET (75 MG TOTAL) BY MOUTH DAILY.  Marland Kitchen Coenzyme Q10 (CO Q-10) 300 MG CAPS Take 1 tablet by mouth every morning.   . mesalamine (APRISO) 0.375 g 24 hr capsule TAKE 4 CAPSULES BY MOUTH EVERY DAY  . Multiple Vitamin (MULTIVITAMIN) tablet Take 1 tablet by mouth daily.  Marland Kitchen REPATHA SURECLICK 449 MG/ML SOAJ INJECT 140 MG INTO THE SKIN EVERY 14 (FOURTEEN) DAYS.  Marland Kitchen timolol (TIMOPTIC) 0.5 % ophthalmic solution Place 1 drop into both eyes 2 (two) times daily.   . [DISCONTINUED] doxycycline (MONODOX) 50 MG capsule Take 50 mg by mouth daily.  (Patient not taking: Reported on 04/17/2020)   No facility-administered encounter medications on file as of 05/13/2020.   Allergies  Allergen Reactions  . Atorvastatin Other (See Comments)    transaminits Transaminits, causes liver enzymes to go up   Patient Active Problem List   Diagnosis Date Noted  . Right inguinal hernia 05/31/2017  . Cough 04/14/2017  . Hyperglycemia 02/01/2017  . Change in stool 09/27/2016  . Herpes zoster without complication 67/59/1638  . Glaucoma 08/21/2013  . Mitral regurgitation 05/23/2013  . Ejection fraction   . Shortness of breath 05/13/2013  . 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  Pectus excavatum 05/13/2013  . Left leg numbness 05/13/2013  . BPH (benign prostatic hypertrophy) 07/06/2011  . CAD (coronary artery disease)   . Hyperlipidemia 06/12/2009  . VITAMIN D DEFICIENCY 06/04/2009  . ALLERGIC RHINITIS 06/04/2009  . GERD 06/04/2009  . ANXIETY STATE, UNSPECIFIED 05/15/2009  . Osteopenia 05/15/2009  . Ulcerative colitis ([place]) 05/17/2007   Social History   Socioeconomic History  . Marital status: Married    Spouse name: Not on file  . Number of children: 1  . Years of education: Not on file  . Highest  education level: Not on file  Occupational History  . Occupation: retired    Fish farm manager: RETIRED  Tobacco Use  . Smoking status: Never Smoker  . Smokeless tobacco: Never Used  . Tobacco comment: Married, 1 stepson, 5 g-kids. Retired Emergency planning/management officer, now Educational psychologist  . Vaping Use: Never used  Substance and Sexual Activity  . Alcohol use: Yes    Comment: occasional wine  . Drug use: No  . Sexual activity: Yes    Partners: Female  Other Topics Concern  . Not on file  Social History Narrative   Exercising regularly: walks at least 5 miles a day, 6 days a week   Social Determinants of Health   Financial Resource Strain: Not on file  Food Insecurity: Not on file  Transportation Needs: Not on file  Physical Activity: Not on file  Stress: Not on file  Social Connections: Not on file  Intimate Partner Violence: Not on file    Joseph Booker's family history includes Asthma in his sister; CAD in his mother; Diabetes in his mother; Heart attack in his father; Heart disease in his mother; Heart disease (age of onset: 39) in his father; Lung disease in his mother; Rheum arthritis in his mother.      Objective:    Vitals:   05/13/20 0956  BP: 124/74  Pulse: (!) 43    Physical Exam Well-developed well-nourished older white male in no acute distress.  Height, Weight, 151   HEENT; nontraumatic normocephalic, EOMI, PE R LA, sclera anicteric. Oropharynx; not examined today Neck; supple, no JVD Cardiovascular; regular rate and rhythm with S1-S2, no murmur rub or gallop Pulmonary; Clear bilaterally Abdomen; soft, nontender, nondistended, no palpable mass or hepatosplenomegaly, bowel sounds are active Rectal; not done today Skin; benign exam, no jaundice rash or appreciable lesions Extremities; no clubbing cyanosis or edema skin warm and dry Neuro/Psych; alert and oriented x4, grossly nonfocal mood and affect appropriate       Assessment & Plan:   #44 70 year old white  male with history of mild pan ulcerative colitis.  Recent colonoscopy with evidence of mild activity throughout the colon, no dysplasia.  On Apriso 1.5 g daily maintenance Budesonide 9 mg daily started about 1 month ago no significant change in his very minimal symptoms  #2 chronic antiplatelet therapy-on Plavix #3 coronary artery disease #4.  History of thoracic aneurysm-stable 4.5 cm  Plan; continue Apriso 1.4 g daily We will plan to continue budesonide 9 mg daily for 1 more month then decrease to 6 mg daily for 1 month then decrease to 3 mg daily for 1 month.  He is  asked to call if he develops any increase in symptoms while tapering from budesonide.  plan follow-up with Dr. Silverio Decamp  in about 4 months. Follow-up colonoscopy 2 years  Alfredia Ferguson PA-C 05/13/2020   Cc: Binnie Rail, MD

## 2020-05-15 ENCOUNTER — Ambulatory Visit (INDEPENDENT_AMBULATORY_CARE_PROVIDER_SITE_OTHER): Payer: Medicare Other | Admitting: Cardiovascular Disease

## 2020-05-15 ENCOUNTER — Other Ambulatory Visit: Payer: Self-pay

## 2020-05-15 ENCOUNTER — Encounter: Payer: Self-pay | Admitting: Cardiovascular Disease

## 2020-05-15 VITALS — BP 170/74 | HR 48 | Ht 68.5 in | Wt 147.8 lb

## 2020-05-15 DIAGNOSIS — E7800 Pure hypercholesterolemia, unspecified: Secondary | ICD-10-CM | POA: Diagnosis not present

## 2020-05-15 DIAGNOSIS — I2510 Atherosclerotic heart disease of native coronary artery without angina pectoris: Secondary | ICD-10-CM | POA: Diagnosis not present

## 2020-05-15 DIAGNOSIS — I712 Thoracic aortic aneurysm, without rupture: Secondary | ICD-10-CM | POA: Diagnosis not present

## 2020-05-15 DIAGNOSIS — R030 Elevated blood-pressure reading, without diagnosis of hypertension: Secondary | ICD-10-CM

## 2020-05-15 DIAGNOSIS — I7121 Aneurysm of the ascending aorta, without rupture: Secondary | ICD-10-CM

## 2020-05-15 HISTORY — DX: Thoracic aortic aneurysm, without rupture: I71.2

## 2020-05-15 HISTORY — DX: Aneurysm of the ascending aorta, without rupture: I71.21

## 2020-05-15 HISTORY — DX: Elevated blood-pressure reading, without diagnosis of hypertension: R03.0

## 2020-05-15 NOTE — Assessment & Plan Note (Signed)
Blood pressure is elevated today both initially and on repeat.  He thinks that it has been well-controlled at home and with other doctors.  He is going to track his blood pressure at home and write it down.  We will have a virtual follow-up in a month.  He understands that this is his most important risk factor for progression of his ascending aortic aneurysm.

## 2020-05-15 NOTE — Assessment & Plan Note (Signed)
Stable at 4.5 cm on CT 04/2020.  This is unchanged since 2018.  We will get an MRA next year.

## 2020-05-15 NOTE — Progress Notes (Signed)
Reviewed and agree with documentation and assessment and plan. K. Veena Saafir Abdullah , MD   

## 2020-05-15 NOTE — Assessment & Plan Note (Signed)
Doing well without angina.  Okay to continue aspirin and clopidogrel.  He understands that if he develops any bleeding it would be okay to take aspirin only and discontinue the clopidogrel.  He is doing well on Repatha.  LDL goal less than 70.  It was 61 on 12/2019.

## 2020-05-15 NOTE — Patient Instructions (Signed)
Medication Instructions:  Your physician recommends that you continue on your current medications as directed. Please refer to the Current Medication list given to you today.   *If you need a refill on your cardiac medications before your next appointment, please call your pharmacy*  Lab Work: NONE   Testing/Procedures: MRA OF AORTA PRIOR TO YOUR 1 YEAR FOLLOW UP   Follow-Up: At [facility], you and your health needs are our priority.  As part of our continuing mission to provide you with exceptional heart care, we have created designated Provider Care Teams.  These Care Teams include your primary Cardiologist (physician) and Advanced Practice Providers (APPs -  Physician Assistants and Nurse Practitioners) who all work together to provide you with the care you need, when you need it.  We recommend signing up for the patient portal called "MyChart".  Sign up information is provided on this After Visit Summary.  MyChart is used to connect with patients for Virtual Visits (Telemedicine).  Patients are able to view lab/test results, encounter notes, upcoming appointments, etc.  Non-urgent messages can be sent to your provider as well.   To learn more about what you can do with MyChart, go to [email or web address].    Your next appointment:   12 month(s)  The format for your next appointment:   IN PERSON   Provider:   DR [facility] AT [place]   Your physician recommends that you schedule a follow-up appointment in: VIRTUAL VISIT IN 1 MONTH WITH DR Salinas APP   Other Instructions  MONITOR AND LOG YOUR BLOOD PRESSURE DAILY, HAVE LOG AVAILABLE AT YOUR 1 MONTH APPOINTMENT

## 2020-05-15 NOTE — Assessment & Plan Note (Signed)
He is doing well on Repatha.  LDL goal less than 70.  It was 61 on 12/2019.

## 2020-05-15 NOTE — Progress Notes (Signed)
Cardiology Office Visit    Date:  05/15/2020   ID:  Joseph Booker, DOB 07/28/1950, MRN [id number]  PCP:  Binnie Rail, MD  Cardiologist:  Skeet Latch, MD  Electrophysiologist:  None   Evaluation Performed:  Follow-Up Visit  Chief Complaint:  Hyperlipidemia, shortness of breath  History of Present Illness:    Joseph Booker is a 70 y.o. male with CAD, hyperlipidemia, and mild ascending aorta aneurysm here for follow up.  [name] had an LAD PCI in 2004. He had a [employer] 06/2016 that revealed LVEF 55% and no ischemia.  He last saw [name] 02/15/17 at which time he reported some non-exertional shortness of breath.  This has been an ongoing complaint for years.  He had a CPX in 2015 that was normal.  He saw an allergist who suggested that he might have inflammation in his lungs.  His cholesterol is not well controlled on simvastatin so this was switched to atorvastatin.  However his LDL remained above 70 at 40 mg.  It was increased to 80 mg but he developed transaminitis.  Since his last appointment he started on Repatha and tolerated it well.  His liver enzymes have been stable.  At his last appointment he was struggling with weight gain. He had a repeat CT 04/29/20 that showed a stable ascending aortic aneurysm at 4.5 cm.  Today, he is feeling fine. He is still having some trouble with ulcerative colitis, but does not consider this to be a true flare-up at this time. He has not had any bleeding issues. For the previous five months he has also had left leg and left arm numbness as yet undiagnosed. He checks his blood pressure at home inconsistently, but when he does it is stable and averages 130/78. Unless necessary, he would prefer not to take medication for his blood pressure. Normally, he will exercise by walking 5 miles three days a week, which he considers to be lower than his baseline. Currently he denies any exertional symptoms. He denies any chest pain or pressure, shortness of  breath, or palpitations. No pre-syncope, syncope, or lightheadedness/dizziness to note.  Also has no lower extremity edema, orthopnea or PND.   Past Medical History:  Diagnosis Date  . Allergic rhinitis   . Allergy   . Anxiety state, unspecified   . Ascending aortic aneurysm ([place]) 05/15/2020  . CAD (coronary artery disease)    [name] / nuclear 2005, excellent tolerance no scar or ischemia, EF 51% / nuclear, February, 2012, no scar or ischemia / catheterization March 26, 2010.. 10% in-stent restenosis, minimal other nonobstructive coronary disease, ejection fraction 55%., excellent result; ETT 9/13: ex 13:14, no CP, no ischemic ECG changes  . Cancer ([place])    basal cell carcinoma x2 on face  . Cataract    surgery to both eyes  . Elevated blood pressure reading 05/15/2020  . Glaucoma, left eye    left eye  . History of exercise intolerance 05/23/2013   cardiopulmonary exercise test-- normal functional capacity when compared to matched sedentary norms, borderline ventilatory limitation with the patient's exercise tidal volume reaching 90% of the measured inspiratory capacity ( pt reports no dyspnea), did not appear to be circulatory limitation, normal CPX test  . Hyperlipidemia   . Nocturia   . Osteopenia   . Thoracic ascending aortic aneurysm [facility])    followed by dr Skeet Latch---  last CT 12-29-2016 ,  4.5cm  . Ulcerative colitis, universal ([place])    GI--  [name]--  dx 1990s  (per note mild Mayo Score 1)  . Wears glasses    Past Surgical History:  Procedure Laterality Date  . CARDIAC CATHETERIZATION  03/26/2010    dr Dian Queen   single vessel CAD w/ patent mLAD stent w/ 10% in-stent restenosis w/ mild to moderate disease in the remainder LAD w/ no lesions that appearedto be flow-limitinf/  normal LVFSF, ef 55%  . CARDIOVASCULAR STRESS TEST  06-30-2016   [name]   Low risk nuclear study w/ no evidence ishcemia/  normal LV function and wall motion,  nuclear stress ef  55%  . COLONOSCOPY  last one 10-27-2016  . CORONARY ANGIOPLASTY WITH STENT PLACEMENT  07-25-2002   [name]   PTCA w/  DES x1 to mLAD  . INGUINAL HERNIA REPAIR Left 2006  . INGUINAL HERNIA REPAIR Right 07/03/2017   Procedure: OPEN REPAIR OF RIGHT INGUINAL HERNIA WITH MESH;  Surgeon: Kinsinger, Arta Bruce, MD;  Location: [facility];  Service: General;  Laterality: Right;  . INSERTION OF MESH Right 07/03/2017   Procedure: INSERTION OF MESH;  Surgeon: Kinsinger, Arta Bruce, MD;  Location: [facility];  Service: General;  Laterality: Right;  . SEPTOPLASTY  1987   w/ RHINOPLASTY  . TONSILLECTOMY  1959  . TRANSTHORACIC ECHOCARDIOGRAM  06-30-2016     [name]   ef 55-60%/  trivial AR/ mild dilated ascending aorta, 90m/  trivial MR/  mild PR and TR      Current Meds  Medication Sig  . aspirin 81 MG tablet Take 81 mg by mouth daily.  . budesonide (ENTOCORT EC) 3 MG 24 hr capsule Take 3 capsules (9 mg total) by mouth daily.  . clopidogrel (PLAVIX) 75 MG tablet TAKE 1 TABLET (75 MG TOTAL) BY MOUTH DAILY.  .Marland KitchenCoenzyme Q10 (CO Q-10) 300 MG CAPS Take 1 tablet by mouth every morning.   . mesalamine (APRISO) 0.375 g 24 hr capsule TAKE 4 CAPSULES BY MOUTH EVERY DAY  . Multiple Vitamin (MULTIVITAMIN) tablet Take 1 tablet by mouth daily.  .Marland KitchenREPATHA SURECLICK 1226MG/ML SOAJ INJECT 140 MG INTO THE SKIN EVERY 14 (FOURTEEN) DAYS.  .Marland Kitchentimolol (TIMOPTIC) 0.5 % ophthalmic solution Place 1 drop into both eyes 2 (two) times daily.      Allergies:   Atorvastatin   Social History   Tobacco Use  . Smoking status: Never Smoker  . Smokeless tobacco: Never Used  . Tobacco comment: Married, 1 stepson, 5 g-kids. 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 Vaping Use: Never used  Substance Use Topics  . Alcohol use: Yes    Comment: occasional wine  . Drug use: No     Family Hx: The patient's family history includes Asthma in his sister; CAD in his mother; Diabetes  in his mother; Heart attack in his father; Heart disease in his mother; Heart disease (age of onset: 5104 in his father; Lung disease in his mother; Rheum arthritis in his mother. There is no history of Colon cancer, Esophageal cancer, Pancreatic cancer, Kidney disease, Liver disease, Rectal cancer, or Stomach cancer.  ROS:   Please see the history of present illness.    (+) Left leg and arm numbness All other systems reviewed and are negative.   Prior CV studies:   The following studies were reviewed today:  CT ANGIOGRAPHY CHEST WITH CONTRAST 04/29/2020: IMPRESSION: 1. Stable aneurysmal disease of the ascending thoracic aorta measuring approximately 4.5 cm in greatest  diameter. Ascending thoracic aortic aneurysm. Recommend semi-annual imaging followup by CTA or MRA and referral to cardiothoracic surgery if not already obtained. This recommendation follows 2010 ACCF/AHA/AATS/ACR/ASA/SCA/SCAI/SIR/STS/SVM Guidelines for the Diagnosis and Management of Patients With Thoracic Aortic Disease. Circulation. 2010; 121: I778-E423. Aortic aneurysm NOS (ICD10-I71.9) 2. Stable evidence of coronary atherosclerosis.  Echo 05/01/2019:  1. Left ventricular ejection fraction, by estimation, is 60 to 65%. The  left ventricle has normal function. The left ventricle has no regional  wall motion abnormalities. Left ventricular diastolic parameters were  normal.  2. Right ventricular systolic function is normal. The right ventricular  size is normal. There is normal pulmonary artery systolic pressure.  3. The mitral valve is normal in structure. Trivial mitral valve  regurgitation. No evidence of mitral stenosis.  4. The aortic valve is tricuspid. Aortic valve regurgitation is trivial.  No aortic stenosis is present.  5. 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 Aortic dilatation noted. There is  moderate dilatation of the aortic  root.  6. The inferior vena cava is normal in size with greater than 50%  respiratory variability, suggesting right atrial pressure of 3 mmHg.   Echo 02/14/18:   1. Sinus bradycardia in the upper 40s throughout study.  2. Moderate dilatation of the ascending aorta.  3. Aneurysm of the ascending aorta, measured at 4.5 cm. Recommend dedidated imaging ot aorta with CT or MRI if clinically indicated and not recently performed.  4. The left ventricle appears to be normal in size, have normal wall thickness, with EF of 60-65%. Echo evidence of normal diastolic filling patterns.  5. GLS -19.9%.  6. Right ventricular systolic pressure is is normal.  7. The right ventricle is normal in size, has normal wall thickness and normal systolic function.  8. Normal left atrial size.  9. Normal right atrial size. 10. Mild mitral annular calcification. 11. Mitral valve regurgitation is mild by color flow Doppler. 12. The mitral valve normal in structure and function. 13. Normal tricuspid valve. 14. Aortic valve normal. 15. Aortic valve regurgitation is mild by color flow Doppler. 16. There is mild calcification of the aortic valve. 17. There is mild thickening of the aortic valve. 18. No atrial level shunt detected by color flow Doppler. 19. Tricuspid regurgitation is mild.  Coronary CT-A 12/29/16:  Ascending aneurysm 4.5 cm. <50% LM stenosis.  Patent proximal LAD stent.   Lexiscan Myoview 06/2016:  Nuclear stress EF: 55%.  Blood pressure demonstrated a hypertensive response to exercise.  There was no ST segment deviation noted during stress.  The study is normal.  This is a low risk study.  The left ventricular ejection fraction is normal (55-65%).  Normal resting and stress perfusion. No ischemia or infarction EF 55%  Echo 06/30/16:  Nuclear stress EF: 55%.  Blood pressure demonstrated a hypertensive response to exercise.  There was no ST segment deviation noted during  stress.  The study is normal.  This is a low risk study.  The left ventricular ejection fraction is normal (55-65%).  Normal resting and stress perfusion. No ischemia or infarction EF 55%  CPX 05/23/13: Normal  Labs/Other Tests and Data Reviewed:    EKG:   09/13/2019: EKG was not ordered. 05/15/2020: Sinus bradycardia. Rate 48 bpm. Left axis deviation.  Recent Labs: 12/30/2019: ALT 19; BUN 23; Creatinine, Ser 1.27; Potassium 4.8; Sodium 140   Recent Lipid Panel Lab Results  Component Value Date/Time  CHOL 135 12/30/2019 09:08 AM   TRIG 94 12/30/2019 09:08 AM   TRIG 46 12/19/2008 12:00 AM   HDL 56 12/30/2019 09:08 AM   CHOLHDL 2.4 12/30/2019 09:08 AM   CHOLHDL 4 03/25/2016 09:14 AM   LDLCALC 61 12/30/2019 09:08 AM    Wt Readings from Last 3 Encounters:  05/15/20 147 lb 12.8 oz (67 kg)  04/17/20 151 lb (68.5 kg)  03/11/20 154 lb (69.9 kg)     Objective:    VS:  BP (!) 170/74   Pulse (!) 48   Ht 5' 8.5" (1.74 m)   Wt 147 lb 12.8 oz (67 kg)   BMI 22.15 kg/m  , BMI Body mass index is 22.15 kg/m. GENERAL:  Well appearing HEENT: Pupils equal round and reactive, fundi not visualized, oral mucosa unremarkable NECK:  No jugular venous distention, waveform within normal limits, carotid upstroke brisk and symmetric, no bruits LUNGS:  Clear to auscultation bilaterally HEART:  Bradycardic.  Regular rhythm.  PMI not displaced or sustained,S1 and S2 within normal limits, no S3, no S4, no clicks, no rubs, no murmurs ABD:  Flat, positive bowel sounds normal in frequency in pitch, no bruits, no rebound, no guarding, no midline pulsatile mass, no hepatomegaly, no splenomegaly EXT:  2 plus pulses throughout, no edema, no cyanosis no clubbing SKIN:  No rashes no nodules NEURO:  Cranial nerves II through XII grossly intact, motor grossly intact throughout PSYCH:  Cognitively intact, oriented to person place and time   ASSESSMENT & PLAN:   CAD (coronary artery disease) Doing  well without angina.  Okay to continue aspirin and clopidogrel.  He understands that if he develops any bleeding it would be okay to take aspirin only and discontinue the clopidogrel.  He is doing well on Repatha.  LDL goal less than 70.  It was 61 on 12/2019.  Hyperlipidemia He is doing well on Repatha.  LDL goal less than 70.  It was 61 on 12/2019.  Ascending aortic aneurysm ([facility]) Stable at 4.5 cm on CT 04/2020.  This is unchanged since 2018.  We will get an MRA next year.  Elevated blood pressure reading Blood pressure is elevated today both initially and on repeat.  He thinks that it has been well-controlled at home and with other doctors.  He is going to track his blood pressure at home and write it down.  We will have a virtual follow-up in a month.  He understands that this is his most important risk factor for progression of his ascending aortic aneurysm.    Medication Adjustments/Labs and Tests Ordered: Current medicines are reviewed at length with the patient today.  Concerns regarding medicines are outlined above.   Tests Ordered: No orders of the defined types were placed in this encounter.   Medication Changes: No orders of the defined types were placed in this encounter.   Follow Up: Virtual with Terrel Manalo C. Oval Linsey, MD, [facility] in one month. In clinic with Anyla Israelson C. Oval Linsey, MD, [facility] in 1 year.   I,Mathew Stumpf,acting as a Education administrator for Skeet Latch, MD.,have documented all relevant documentation on the behalf of Skeet Latch, MD,as directed by  Skeet Latch, MD while in the presence of Skeet Latch, MD.  I, Benton Oval Linsey, MD have reviewed all documentation for this visit.  The documentation of the exam, diagnosis, procedures, and orders on 05/15/2020 are all accurate and complete.  Time spent: 25 minutes-Greater than 50% of this time was spent in counseling, explanation of diagnosis,  planning of further management, and coordination of  care.    Signed, Skeet Latch, MD  05/15/2020 10:33 AM    [facility]

## 2020-06-12 ENCOUNTER — Encounter: Payer: Self-pay | Admitting: Cardiovascular Disease

## 2020-06-12 ENCOUNTER — Ambulatory Visit (INDEPENDENT_AMBULATORY_CARE_PROVIDER_SITE_OTHER): Payer: Medicare Other | Admitting: Cardiovascular Disease

## 2020-06-12 ENCOUNTER — Other Ambulatory Visit: Payer: Self-pay

## 2020-06-12 VITALS — BP 136/78 | HR 54 | Ht 68.0 in | Wt 146.4 lb

## 2020-06-12 DIAGNOSIS — I712 Thoracic aortic aneurysm, without rupture: Secondary | ICD-10-CM | POA: Diagnosis not present

## 2020-06-12 DIAGNOSIS — R079 Chest pain, unspecified: Secondary | ICD-10-CM | POA: Diagnosis not present

## 2020-06-12 DIAGNOSIS — I7121 Aneurysm of the ascending aorta, without rupture: Secondary | ICD-10-CM

## 2020-06-12 DIAGNOSIS — R030 Elevated blood-pressure reading, without diagnosis of hypertension: Secondary | ICD-10-CM

## 2020-06-12 DIAGNOSIS — I77810 Thoracic aortic ectasia: Secondary | ICD-10-CM

## 2020-06-12 DIAGNOSIS — I2510 Atherosclerotic heart disease of native coronary artery without angina pectoris: Secondary | ICD-10-CM

## 2020-06-12 DIAGNOSIS — R0602 Shortness of breath: Secondary | ICD-10-CM | POA: Diagnosis not present

## 2020-06-12 NOTE — Assessment & Plan Note (Signed)
He walks regularly for exercise but notes new exertional dyspnea when trying to carry his dog.  He has known underlying coronary disease.  We will get an exercise Myoview to assess for ischemia.

## 2020-06-12 NOTE — Assessment & Plan Note (Signed)
BP control has been borderline at home.  He has both elevated and low blood pressures.  We did discuss the fact that blood pressure can vary somewhat.  Stressed the importance of waiting 5 minutes before checking his blood pressure.  He also notes that he is taking a steroid which may also be contributing.  He should be done with this within the next month.  He will continue to track it at home.  His goal is less than 130/80.

## 2020-06-12 NOTE — Assessment & Plan Note (Signed)
Status post LAD PCI.  He is having exertional dyspnea.  We will get an exercise Myoview as above.  Continue aspirin, clopidogrel, and Repatha.

## 2020-06-12 NOTE — Patient Instructions (Signed)
Medication Instructions:  Your physician recommends that you continue on your current medications as directed. Please refer to the Current Medication list given to you today.   *If you need a refill on your cardiac medications before your next appointment, please call your pharmacy*  Lab Work: NONE  Testing/Procedures: Your physician has requested that you have en exercise stress myoview. For further information please visit [email or web address]. Please follow instruction sheet, as given.   Follow-Up: At [facility], you and your health needs are our priority.  As part of our continuing mission to provide you with exceptional heart care, we have created designated Provider Care Teams.  These Care Teams include your primary Cardiologist (physician) and Advanced Practice Providers (APPs -  Physician Assistants and Nurse Practitioners) who all work together to provide you with the care you need, when you need it.  We recommend signing up for the patient portal called "MyChart".  Sign up information is provided on this After Visit Summary.  MyChart is used to connect with patients for Virtual Visits (Telemedicine).  Patients are able to view lab/test results, encounter notes, upcoming appointments, etc.  Non-urgent messages can be sent to your provider as well.   To learn more about what you can do with MyChart, go to [email or web address].    Your next appointment:   6 month(s)  The format for your next appointment:   In Person  Provider:    DR [place]

## 2020-06-12 NOTE — Progress Notes (Signed)
Cardiology Office Visit    Date:  06/12/2020   ID:  Joseph Booker 03-22-1950, MRN [id number]  PCP:  Binnie Rail, MD  Cardiologist:  Skeet Latch, MD  Electrophysiologist:  None   Evaluation Performed:  Follow-Up Visit  Chief Complaint:  Hyperlipidemia, shortness of breath  History of Present Illness:    Joseph Booker is a 70 y.o. male with CAD, hyperlipidemia, and mild ascending aorta aneurysm here for follow up.  [name] had an LAD PCI in 2004. He had a [employer] 06/2016 that revealed LVEF 55% and no ischemia.  He last saw Dr. Johnsie Booker 02/15/17 at which time he reported some non-exertional shortness of breath.  This has been an ongoing complaint for years.  He had a CPX in 2015 that was normal.  He saw an allergist who suggested that he might have inflammation in his lungs.  His cholesterol is not well controlled on simvastatin so this was switched to atorvastatin.  However his LDL remained above 70 at 40 mg.  It was increased to 80 mg but he developed transaminitis.  Since his last appointment he started on Repatha and tolerated it well.  His liver enzymes have been stable.  He was struggling with weight gain. He had a repeat CT 04/29/20 that showed a stable ascending aortic aneurysm at 4.5 cm.  At his last appointment 04/2020 he was feeling fine. He was still having some trouble with ulcerative colitis, but does not consider this to be a true flare-up at this time. His blood pressure was 170/77 but had been controlled at home.  Today, he is feeling okay overall, and presents his blood pressure log. He continues to have left arm and leg numbness for the previous 5 months. When he notices this he is not doing anything strenuous. He is able to walk 3-5 miles at a time. However, if he is carrying his dog he feels significantly short of breath which concerns him. He notes he will be off of budesonide soon and hopes this will improve his anxious feelings and blood pressure. He denies any  chest pain, or palpitations. No headaches, lightheadedness, or syncope to report. Also has no lower extremity edema, orthopnea or PND.   Past Medical History:  Diagnosis Date  . Allergic rhinitis   . Allergy   . Anxiety state, unspecified   . Ascending aortic aneurysm ([place]) 05/15/2020  . CAD (coronary artery disease)    [name] / nuclear 2005, excellent tolerance no scar or ischemia, EF 51% / nuclear, February, 2012, no scar or ischemia / catheterization March 26, 2010.. 10% in-stent restenosis, minimal other nonobstructive coronary disease, ejection fraction 55%., excellent result; ETT 9/13: ex 13:14, no CP, no ischemic ECG changes  . Cancer ([place])    basal cell carcinoma x2 on face  . Cataract    surgery to both eyes  . Elevated blood pressure reading 05/15/2020  . Glaucoma, left eye    left eye  . History of exercise intolerance 05/23/2013   cardiopulmonary exercise test-- normal functional capacity when compared to matched sedentary norms, borderline ventilatory limitation with the patient's exercise tidal volume reaching 90% of the measured inspiratory capacity ( pt reports no dyspnea), did not appear to be circulatory limitation, normal CPX test  . Hyperlipidemia   . Nocturia   . Osteopenia   . Thoracic ascending aortic aneurysm [facility])    followed by dr Skeet Latch---  last CT 12-29-2016 ,  4.5cm  . Ulcerative colitis, universal (  [place])    GI-- [name]--  dx 1990s  (per note mild Mayo Score 1)  . Wears glasses    Past Surgical History:  Procedure Laterality Date  . CARDIAC CATHETERIZATION  03/26/2010    dr Dian Queen   single vessel CAD w/ patent mLAD stent w/ 10% in-stent restenosis w/ mild to moderate disease in the remainder LAD w/ no lesions that appearedto be flow-limitinf/  normal LVFSF, ef 55%  . CARDIOVASCULAR STRESS TEST  06-30-2016   dr Joseph Booker   Low risk nuclear study w/ no evidence ishcemia/  normal LV function and wall motion,  nuclear stress ef 55%  .  COLONOSCOPY  last one 10-27-2016  . CORONARY ANGIOPLASTY WITH STENT PLACEMENT  07-25-2002   [name]   PTCA w/  DES x1 to mLAD  . INGUINAL HERNIA REPAIR Left 2006  . INGUINAL HERNIA REPAIR Right 07/03/2017   Procedure: OPEN REPAIR OF RIGHT INGUINAL HERNIA WITH MESH;  Surgeon: Kinsinger, Arta Bruce, MD;  Location: [place];  Service: General;  Laterality: Right;  . INSERTION OF MESH Right 07/03/2017   Procedure: INSERTION OF MESH;  Surgeon: Kinsinger, Arta Bruce, MD;  Location: [facility];  Service: General;  Laterality: Right;  . SEPTOPLASTY  1987   w/ RHINOPLASTY  . TONSILLECTOMY  1959  . TRANSTHORACIC ECHOCARDIOGRAM  06-30-2016     dr Joseph Booker   ef 55-60%/  trivial AR/ mild dilated ascending aorta, 6m/  trivial MR/  mild PR and TR      Current Meds  Medication Sig  . aspirin 81 MG tablet Take 81 mg by mouth daily.  . budesonide (ENTOCORT EC) 3 MG 24 hr capsule Take 3 capsules (9 mg total) by mouth daily. (Patient taking differently: Take 3 mg by mouth daily.)  . clopidogrel (PLAVIX) 75 MG tablet TAKE 1 TABLET (75 MG TOTAL) BY MOUTH DAILY.  .Marland KitchenCoenzyme Q10 (CO Q-10) 300 MG CAPS Take 1 tablet by mouth every morning.   . mesalamine (APRISO) 0.375 g 24 hr capsule TAKE 4 CAPSULES BY MOUTH EVERY DAY  . Multiple Vitamin (MULTIVITAMIN) tablet Take 1 tablet by mouth daily.  .Marland KitchenREPATHA SURECLICK 1308MG/ML SOAJ INJECT 140 MG INTO THE SKIN EVERY 14 (FOURTEEN) DAYS.  .Marland Kitchentimolol (TIMOPTIC) 0.5 % ophthalmic solution Place 1 drop into both eyes 2 (two) times daily.      Allergies:   Atorvastatin   Social History   Tobacco Use  . Smoking status: Never Smoker  . Smokeless tobacco: Never Used  . Tobacco comment: Married, 1 stepson, 5 g-kids. 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 Vaping Use: Never used  Substance Use Topics  . Alcohol use: Yes    Comment: occasional wine  . Drug use: No     Family Hx: The patient's family history includes  Asthma in his sister; CAD in his mother; Diabetes in his mother; Heart attack in his father; Heart disease in his mother; Heart disease (age of onset: 535 in his father; Lung disease in his mother; Rheum arthritis in his mother. There is no history of Colon cancer, Esophageal cancer, Pancreatic cancer, Kidney disease, Liver disease, Rectal cancer, or Stomach cancer.  ROS:   Please see the history of present illness.    (+) Numbness in left extremities (+) Shortness of breath (+) Anxiety All other systems reviewed and are negative.   Prior CV studies:   The following studies were reviewed today:  CT ANGIOGRAPHY CHEST  WITH CONTRAST 04/29/2020: IMPRESSION: 1. Stable aneurysmal disease of the ascending thoracic aorta measuring approximately 4.5 cm in greatest diameter. Ascending thoracic aortic aneurysm. Recommend semi-annual imaging followup by CTA or MRA and referral to cardiothoracic surgery if not already obtained. This recommendation follows 2010 ACCF/AHA/AATS/ACR/ASA/SCA/SCAI/SIR/STS/SVM Guidelines for the Diagnosis and Management of Patients With Thoracic Aortic Disease. Circulation. 2010; 121: R485-I627. Aortic aneurysm NOS (ICD10-I71.9) 2. Stable evidence of coronary atherosclerosis.  Echo 05/01/2019:  1. Left ventricular ejection fraction, by estimation, is 60 to 65%. The  left ventricle has normal function. The left ventricle has no regional  wall motion abnormalities. Left ventricular diastolic parameters were  normal.  2. Right ventricular systolic function is normal. The right ventricular  size is normal. There is normal pulmonary artery systolic pressure.  3. The mitral valve is normal in structure. Trivial mitral valve  regurgitation. No evidence of mitral stenosis.  4. The aortic valve is tricuspid. Aortic valve regurgitation is trivial.  No aortic stenosis is present.  5. Overall fusiform dilatation of sinus and ascending aortic root similar  to previous echo I  meausre 4.5 cm . Aortic dilatation noted. There is  moderate dilatation of the aortic root.  6. The inferior vena cava is normal in size with greater than 50%  respiratory variability, suggesting right atrial pressure of 3 mmHg.   Echo 02/14/18:   1. Sinus bradycardia in the upper 40s throughout study.  2. Moderate dilatation of the ascending aorta.  3. Aneurysm of the ascending aorta, measured at 4.5 cm. Recommend dedidated imaging ot aorta with CT or MRI if clinically indicated and not recently performed.  4. The left ventricle appears to be normal in size, have normal wall thickness, with EF of 60-65%. Echo evidence of normal diastolic filling patterns.  5. GLS -19.9%.  6. Right ventricular systolic pressure is is normal.  7. The right ventricle is normal in size, has normal wall thickness and normal systolic function.  8. Normal left atrial size.  9. Normal right atrial size. 10. Mild mitral annular calcification. 11. Mitral valve regurgitation is mild by color flow Doppler. 12. The mitral valve normal in structure and function. 13. Normal tricuspid valve. 14. Aortic valve normal. 15. Aortic valve regurgitation is mild by color flow Doppler. 16. There is mild calcification of the aortic valve. 17. There is mild thickening of the aortic valve. 18. No atrial level shunt detected by color flow Doppler. 19. Tricuspid regurgitation is mild.  Coronary CT-A 12/29/16:  Ascending aneurysm 4.5 cm. <50% LM stenosis.  Patent proximal LAD stent.   Lexiscan Myoview 06/2016:  Nuclear stress EF: 55%.  Blood pressure demonstrated a hypertensive response to exercise.  There was no ST segment deviation noted during stress.  The study is normal.  This is a low risk study.  The left ventricular ejection fraction is normal (55-65%).  Normal resting and stress perfusion. No ischemia or infarction EF 55%  Echo 06/30/16:  Nuclear stress EF: 55%.  Blood pressure demonstrated a  hypertensive response to exercise.  There was no ST segment deviation noted during stress.  The study is normal.  This is a low risk study.  The left ventricular ejection fraction is normal (55-65%).  Normal resting and stress perfusion. No ischemia or infarction EF 55%  CPX 05/23/13: Normal  Labs/Other Tests and Data Reviewed:    EKG:   09/13/2019: EKG was not ordered. 05/15/2020: Sinus bradycardia. Rate 48 bpm. Left axis deviation. 06/12/2020: EKG is not ordered today.  Recent Labs:  12/30/2019: ALT 19; BUN 23; Creatinine, Ser 1.27; Potassium 4.8; Sodium 140   Recent Lipid Panel Lab Results  Component Value Date/Time   CHOL 135 12/30/2019 09:08 AM   TRIG 94 12/30/2019 09:08 AM   TRIG 46 12/19/2008 12:00 AM   HDL 56 12/30/2019 09:08 AM   CHOLHDL 2.4 12/30/2019 09:08 AM   CHOLHDL 4 03/25/2016 09:14 AM   LDLCALC 61 12/30/2019 09:08 AM    Wt Readings from Last 3 Encounters:  06/12/20 146 lb 6.4 oz (66.4 kg)  05/15/20 147 lb 12.8 oz (67 kg)  04/17/20 151 lb (68.5 kg)     Objective:    VS:  BP 136/78   Pulse (!) 54   Ht 5' 8"  (1.727 m)   Wt 146 lb 6.4 oz (66.4 kg)   SpO2 98%   BMI 22.26 kg/m  , BMI Body mass index is 22.26 kg/m. GENERAL:  Well appearing HEENT: Pupils equal round and reactive, fundi not visualized, oral mucosa unremarkable NECK:  No jugular venous distention, waveform within normal limits, carotid upstroke brisk and symmetric, no bruits LUNGS:  Clear to auscultation bilaterally HEART:  Bradycardic.  Regular rhythm.  PMI not displaced or sustained,S1 and S2 within normal limits, no S3, no S4, no clicks, no rubs, no murmurs ABD:  Flat, positive bowel sounds normal in frequency in pitch, no bruits, no rebound, no guarding, no midline pulsatile mass, no hepatomegaly, no splenomegaly EXT:  2 plus pulses throughout, no edema, no cyanosis no clubbing SKIN:  No rashes no nodules NEURO:  Cranial nerves II through XII grossly intact, motor grossly intact  throughout PSYCH:  Cognitively intact, oriented to person place and time   ASSESSMENT & PLAN:   Dilated aortic root Stable at 4.5 cm on 04/2020.  This was unchanged from 2019.  Again stressed the importance of blood pressure control.  He is unable to be on a beta-blocker due to bradycardia.  Shortness of breath He walks regularly for exercise but notes new exertional dyspnea when trying to carry his dog.  He has known underlying coronary disease.  We will get an exercise Myoview to assess for ischemia.    Elevated blood pressure reading BP control has been borderline at home.  He has both elevated and low blood pressures.  We did discuss the fact that blood pressure can vary somewhat.  Stressed the importance of waiting 5 minutes before checking his blood pressure.  He also notes that he is taking a steroid which may also be contributing.  He should be done with this within the next month.  He will continue to track it at home.  His goal is less than 130/80.  CAD (coronary artery disease) Status post LAD PCI.  He is having exertional dyspnea.  We will get an exercise Myoview as above.  Continue aspirin, clopidogrel, and Repatha.    Medication Adjustments/Labs and Tests Ordered: Current medicines are reviewed at length with the patient today.  Concerns regarding medicines are outlined above.   Tests Ordered: Orders Placed This Encounter  Procedures  . MYOCARDIAL PERFUSION IMAGING    Medication Changes: No orders of the defined types were placed in this encounter.   Follow Up: In clinic with [place] [facility] C. Oval Linsey, MD, [facility] in 6 months.   I,Mathew Stumpf,acting as a Education administrator for Skeet Latch, MD.,have documented all relevant documentation on the behalf of Skeet Latch, MD,as directed by  Skeet Latch, MD while in the presence of Skeet Latch, MD.  I, [place] Oval Linsey,  MD have reviewed all documentation for this visit.  The documentation of the exam, diagnosis,  procedures, and orders on 06/12/2020 are all accurate and complete.  Signed, Skeet Latch, MD  06/12/2020 12:12 PM    [place]

## 2020-06-12 NOTE — Assessment & Plan Note (Signed)
Stable at 4.5 cm on 04/2020.  This was unchanged from 2019.  Again stressed the importance of blood pressure control.  He is unable to be on a beta-blocker due to bradycardia.

## 2020-06-19 ENCOUNTER — Ambulatory Visit (HOSPITAL_COMMUNITY)
Admission: RE | Admit: 2020-06-19 | Discharge: 2020-06-19 | Disposition: A | Discharge status: Home / Self Care | Payer: Medicare Other | Source: Ambulatory Visit | Attending: Cardiovascular Disease | Admitting: Cardiovascular Disease

## 2020-06-19 ENCOUNTER — Other Ambulatory Visit: Payer: Self-pay

## 2020-06-19 DIAGNOSIS — R0602 Shortness of breath: Secondary | ICD-10-CM

## 2020-06-19 DIAGNOSIS — R079 Chest pain, unspecified: Secondary | ICD-10-CM | POA: Insufficient documentation

## 2020-06-19 LAB — MYOCARDIAL PERFUSION IMAGING
Estimated workload: 13.6 METS
Exercise duration (min): 13 min
Exercise duration (sec): 1 s
LV dias vol: 112 mL (ref 62–150)
LV sys vol: 58 mL
MPHR: 152 {beats}/min
Peak HR: 137 {beats}/min
Percent HR: 90 %
Rest HR: 43 {beats}/min
SDS: 0
SRS: 2
SSS: 2
TID: 0.9

## 2020-06-19 MED ORDER — REGADENOSON 0.4 MG/5ML IV SOLN: 0.4000 mg | Freq: Once | INTRAVENOUS | Status: DC

## 2020-06-19 MED ORDER — TECHNETIUM TC 99M TETROFOSMIN IV KIT
9.6000 | PACK | Freq: Once | INTRAVENOUS | Status: AC | PRN
  Administered 2020-06-19: 9.6 via INTRAVENOUS
  Filled 2020-06-19: qty 10

## 2020-06-19 MED ORDER — TECHNETIUM TC 99M TETROFOSMIN IV KIT
31.8000 | PACK | Freq: Once | INTRAVENOUS | Status: AC | PRN
  Administered 2020-06-19: 31.8 via INTRAVENOUS
  Filled 2020-06-19: qty 32

## 2020-06-23 ENCOUNTER — Other Ambulatory Visit: Payer: Self-pay | Admitting: *Deleted

## 2020-06-23 DIAGNOSIS — R9430 Abnormal result of cardiovascular function study, unspecified: Secondary | ICD-10-CM

## 2020-06-23 DIAGNOSIS — R0602 Shortness of breath: Secondary | ICD-10-CM

## 2020-06-23 DIAGNOSIS — R931 Abnormal findings on diagnostic imaging of heart and coronary circulation: Secondary | ICD-10-CM

## 2020-07-08 ENCOUNTER — Encounter: Payer: Medicare Other | Admitting: Neurology

## 2020-07-17 ENCOUNTER — Ambulatory Visit (HOSPITAL_COMMUNITY): Payer: Medicare Other | Attending: Cardiovascular Disease

## 2020-07-17 ENCOUNTER — Other Ambulatory Visit: Payer: Self-pay

## 2020-07-17 DIAGNOSIS — R9430 Abnormal result of cardiovascular function study, unspecified: Secondary | ICD-10-CM | POA: Insufficient documentation

## 2020-07-17 DIAGNOSIS — R0602 Shortness of breath: Secondary | ICD-10-CM | POA: Diagnosis not present

## 2020-07-17 LAB — ECHOCARDIOGRAM COMPLETE
Area-P 1/2: 1.47 cm2
Calc EF: 57.3 %
P 1/2 time: 966 msec
S' Lateral: 3 cm
Single Plane A2C EF: 58.6 %
Single Plane A4C EF: 57.6 %

## 2020-07-22 ENCOUNTER — Other Ambulatory Visit: Payer: Self-pay

## 2020-07-22 ENCOUNTER — Ambulatory Visit (INDEPENDENT_AMBULATORY_CARE_PROVIDER_SITE_OTHER): Payer: Medicare Other | Admitting: Neurology

## 2020-07-22 DIAGNOSIS — R200 Anesthesia of skin: Secondary | ICD-10-CM | POA: Diagnosis not present

## 2020-07-22 NOTE — Procedures (Signed)
[facility]  [address]  [place], [place] [address] Tel: [phone] Fax:  [phone] Test Date:  07/22/2020  Patient: Joseph Booker DOB: 06/12/1950 Physician: Narda Amber, DO  Sex: Male Height: 5' 8"  Ref Phys: Metta Clines, D.O.  ID#: [id number]   Technician:    Patient Complaints: This is a 70 year old man referred for evaluation of left arm and leg paresthesias.  NCV & EMG Findings: Extensive electrodiagnostic testing of the left upper and lower extremity shows:  All sensory responses including the left median, ulnar, mixed palmar, sural, and superficial peroneal nerves are within normal limits. All motor responses including the left median, ulnar, peroneal, and tibial nerves are within normal limits. Left tibial H reflex study is within normal limits. There is no evidence of active or chronic motor axonal loss changes affecting any of the tested muscles.  Motor unit configuration and recruitment pattern is within normal limits.  Impression: This is a normal study of the left upper and lower extremities.  In particular, there is no evidence of carpal tunnel syndrome, cervical/lumbosacral radiculopathy, or sensorimotor polyneuropathy affecting the left side.   ___________________________ Narda Amber, DO    Nerve Conduction Studies Anti Sensory Summary Table   Stim Site NR Peak (ms) Norm Peak (ms) P-T Amp (V) Norm P-T Amp  Left Median Anti Sensory (2nd Digit)  33C  Wrist    3.2 <3.8 33.0 >10  Left Sup Peroneal Anti Sensory (Ant Lat Mall)  33C  12 cm    2.3 <4.6 6.4 >3  Left Sural Anti Sensory (Lat Mall)  33C  Calf    3.0 <4.6 9.7 >3  Left Ulnar Anti Sensory (5th Digit)  33C  Wrist    3.2 <3.2 30.8 >5   Motor Summary Table   Stim Site NR Onset (ms) Norm Onset (ms) O-P Amp (mV) Norm O-P Amp Site1 Site2 Delta-0 (ms) Dist (cm) Vel (m/s) Norm Vel (m/s)  Left Median Motor (Abd Poll Brev)  33C  Wrist    3.0 <4.0 10.1 >5 Elbow Wrist 5.1 29.0 57  >50  Elbow    8.1  10.0         Left Peroneal Motor (Ext Dig Brev)  33C  Ankle    3.5 <6.0 3.7 >2.5 B Fib Ankle 7.6 36.0 47 >40  B Fib    11.1  3.4  Poplt B Fib 1.6 8.0 50 >40  Poplt    12.7  3.3         Left Tibial Motor (Abd Hall Brev)  33C  Ankle    3.3 <6.0 7.3 >4 Knee Ankle 8.3 41.0 49 >40  Knee    11.6  6.7         Left Ulnar Motor (Abd Dig Minimi)  33C  Wrist    2.1 <3.1 10.7 >7 B Elbow Wrist 4.4 22.0 50 >50  B Elbow    6.5  10.2  A Elbow B Elbow 1.9 10.0 53 >50  A Elbow    8.4  10.1          Comparison Summary Table   Stim Site NR Peak (ms) Norm Peak (ms) P-T Amp (V) Site1 Site2 Delta-P (ms) Norm Delta (ms)  Left Median/Ulnar Palm Comparison (Wrist - 8cm)  33C  Median Palm    1.8 <2.2 74.1 Median Palm Ulnar Palm 0.0   Ulnar Palm    1.8 <2.2 25.5       H Reflex Studies   NR H-Lat (  ms) Lat Norm (ms) L-R H-Lat (ms)  Left Tibial (Gastroc)  33C     32.38 <35    EMG   Side Muscle Ins Act Fibs Psw Fasc Number Recrt Dur Dur. Amp Amp. Poly Poly. Comment  Left AntTibialis Nml Nml Nml Nml Nml Nml Nml Nml Nml Nml Nml Nml N/A  Left Gastroc Nml Nml Nml Nml Nml Nml Nml Nml Nml Nml Nml Nml N/A  Left Flex Dig Long Nml Nml Nml Nml Nml Nml Nml Nml Nml Nml Nml Nml N/A  Left RectFemoris Nml Nml Nml Nml Nml Nml Nml Nml Nml Nml Nml Nml N/A  Left GluteusMed Nml Nml Nml Nml Nml Nml Nml Nml Nml Nml Nml Nml N/A  Left 1stDorInt Nml Nml Nml Nml Nml Nml Nml Nml Nml Nml Nml Nml N/A  Left PronatorTeres Nml Nml Nml Nml Nml Nml Nml Nml Nml Nml Nml Nml N/A  Left Biceps Nml Nml Nml Nml Nml Nml Nml Nml Nml Nml Nml Nml N/A  Left Triceps Nml Nml Nml Nml Nml Nml Nml Nml Nml Nml Nml Nml N/A  Left Deltoid Nml Nml Nml Nml Nml Nml Nml Nml Nml Nml Nml Nml N/A      Waveforms:

## 2020-07-23 NOTE — Progress Notes (Signed)
LMOVM to call the office back in regards to EMG result

## 2020-07-28 DIAGNOSIS — Z20822 Contact with and (suspected) exposure to covid-19: Secondary | ICD-10-CM | POA: Diagnosis not present

## 2020-07-28 DIAGNOSIS — Z20828 Contact with and (suspected) exposure to other viral communicable diseases: Secondary | ICD-10-CM | POA: Diagnosis not present

## 2020-07-31 ENCOUNTER — Telehealth: Payer: Self-pay | Admitting: Cardiovascular Disease

## 2020-07-31 NOTE — Telephone Encounter (Signed)
Agree with recommendations as provided. Low risk lexiscan 06/2020 and echocardiogram 07/2020 with normal LVEF.  Loel Dubonnet, NP

## 2020-07-31 NOTE — Telephone Encounter (Signed)
Called patient back. He states that he has been having chest discomfort for the last 2-3 weeks, he states it is not a pain or a tightness feeling, just uncomfortable. He is SOB with activities, he denies swelling or weight gain. He does not have any recent BP readings, but states his HR is always sin the 50's which is normal for him, and there is no change to that. He states the only medication change is he is no longer taking budesonide, but all other medications are the same. Patient states the last time he had the chest discomfort was a few days ago, and it only lasts a few seconds, he is mostly standing or sitting he does not notice it laying down or with activities. I reviewed the last ECHO and STRESS TEST which were normal testing, and he states this is not emergent he would just like to have it evaluated.  I was able to get patient in with Laurann Montana, NP August 15th at 3:30. Will route to primary MD and covering NP in case she has any other recommendations.   Thank you!

## 2020-07-31 NOTE — Telephone Encounter (Signed)
Pt c/o of Chest Pain: STAT if CP now or developed within 24 hours  1. Are you having CP right now?  Not currently  2. Are you experiencing any other symptoms (ex. SOB, nausea, vomiting, sweating)?  SOB  3. How long have you been experiencing CP?  Past few weeks  4. Is your CP continuous or coming and going?  Coming and going   5. Have you taken Nitroglycerin?  No  Pt c/o Shortness Of Breath: STAT if SOB developed within the last 24 hours or pt is noticeably SOB on the phone  1. Are you currently SOB (can you hear that pt is SOB on the phone)?  No   2. How long have you been experiencing SOB?  Past few weeks   3. Are you SOB when sitting or when up moving around?  When up and moving around  4. Are you currently experiencing any other symptoms?  No, but patient wanted to make Dr. Oval Linsey aware that he was also exposed to [place] in the past week.   ?

## 2020-08-05 DIAGNOSIS — H401123 Primary open-angle glaucoma, left eye, severe stage: Secondary | ICD-10-CM | POA: Diagnosis not present

## 2020-08-05 DIAGNOSIS — H401111 Primary open-angle glaucoma, right eye, mild stage: Secondary | ICD-10-CM | POA: Diagnosis not present

## 2020-08-14 ENCOUNTER — Ambulatory Visit (INDEPENDENT_AMBULATORY_CARE_PROVIDER_SITE_OTHER)
Admission: RE | Admit: 2020-08-14 | Discharge: 2020-08-14 | Disposition: A | Discharge status: Home / Self Care | Payer: Medicare Other | Source: Ambulatory Visit | Attending: Gastroenterology | Admitting: Gastroenterology

## 2020-08-14 ENCOUNTER — Encounter: Payer: Self-pay | Admitting: Gastroenterology

## 2020-08-14 ENCOUNTER — Ambulatory Visit (INDEPENDENT_AMBULATORY_CARE_PROVIDER_SITE_OTHER): Payer: Medicare Other | Admitting: Gastroenterology

## 2020-08-14 ENCOUNTER — Other Ambulatory Visit: Payer: Self-pay

## 2020-08-14 ENCOUNTER — Other Ambulatory Visit (INDEPENDENT_AMBULATORY_CARE_PROVIDER_SITE_OTHER): Payer: Medicare Other

## 2020-08-14 VITALS — BP 100/60 | HR 51 | Ht 68.5 in | Wt 145.0 lb

## 2020-08-14 DIAGNOSIS — Z7952 Long term (current) use of systemic steroids: Secondary | ICD-10-CM | POA: Diagnosis not present

## 2020-08-14 DIAGNOSIS — K582 Mixed irritable bowel syndrome: Secondary | ICD-10-CM

## 2020-08-14 DIAGNOSIS — K5190 Ulcerative colitis, unspecified, without complications: Secondary | ICD-10-CM

## 2020-08-14 DIAGNOSIS — D849 Immunodeficiency, unspecified: Secondary | ICD-10-CM | POA: Diagnosis not present

## 2020-08-14 DIAGNOSIS — Z0000 Encounter for general adult medical examination without abnormal findings: Secondary | ICD-10-CM

## 2020-08-14 DIAGNOSIS — I2510 Atherosclerotic heart disease of native coronary artery without angina pectoris: Secondary | ICD-10-CM

## 2020-08-14 DIAGNOSIS — E559 Vitamin D deficiency, unspecified: Secondary | ICD-10-CM

## 2020-08-14 LAB — COMPREHENSIVE METABOLIC PANEL
ALT: 17 U/L (ref 0–53)
AST: 33 U/L (ref 0–37)
Albumin: 4 g/dL (ref 3.5–5.2)
Alkaline Phosphatase: 45 U/L (ref 39–117)
BUN: 18 mg/dL (ref 6–23)
CO2: 28 mEq/L (ref 19–32)
Calcium: 9.2 mg/dL (ref 8.4–10.5)
Chloride: 106 mEq/L (ref 96–112)
Creatinine, Ser: 1 mg/dL (ref 0.40–1.50)
GFR: 77.41 mL/min (ref >60.00)
Glucose, Bld: 90 mg/dL (ref 70–99)
Potassium: 4.1 mEq/L (ref 3.5–5.1)
Sodium: 141 mEq/L (ref 135–145)
Total Bilirubin: 1.4 mg/dL — ABNORMAL HIGH (ref 0.2–1.2)
Total Protein: 6.9 g/dL (ref 6.0–8.3)

## 2020-08-14 LAB — CBC WITH DIFFERENTIAL/PLATELET
Basophils Absolute: 0.1 10*3/uL (ref 0.0–0.1)
Basophils Relative: 1.2 % (ref 0.0–3.0)
Eosinophils Absolute: 0.2 10*3/uL (ref 0.0–0.7)
Eosinophils Relative: 3.3 % (ref 0.0–5.0)
HCT: 44.6 % (ref 39.0–52.0)
Hemoglobin: 15.3 g/dL (ref 13.0–17.0)
Lymphocytes Relative: 35.8 % (ref 12.0–46.0)
Lymphs Abs: 1.9 10*3/uL (ref 0.7–4.0)
MCHC: 34.3 g/dL (ref 30.0–36.0)
MCV: 97.7 fl (ref 78.0–100.0)
Monocytes Absolute: 0.5 10*3/uL (ref 0.1–1.0)
Monocytes Relative: 9.7 % (ref 3.0–12.0)
Neutro Abs: 2.6 10*3/uL (ref 1.4–7.7)
Neutrophils Relative %: 50 % (ref 43.0–77.0)
Platelets: 153 10*3/uL (ref 150.0–400.0)
RBC: 4.57 Mil/uL (ref 4.22–5.81)
RDW: 12.6 % (ref 11.5–15.5)
WBC: 5.3 10*3/uL (ref 4.0–10.5)

## 2020-08-14 LAB — VITAMIN D 25 HYDROXY (VIT D DEFICIENCY, FRACTURES): VITD: 44.06 ng/mL (ref 30.00–100.00)

## 2020-08-14 LAB — IBC + FERRITIN
Ferritin: 115.1 ng/mL (ref 22.0–322.0)
Iron: 187 ug/dL — ABNORMAL HIGH (ref 42–165)
Saturation Ratios: 60.7 % — ABNORMAL HIGH (ref 20.0–50.0)
Transferrin: 220 mg/dL (ref 212.0–360.0)

## 2020-08-14 LAB — SEDIMENTATION RATE: Sed Rate: 2 mm/hr (ref 0–20)

## 2020-08-14 LAB — B12 AND FOLATE PANEL
Folate: >24.4 ng/mL (ref >5.9)
Vitamin B-12: 501 pg/mL (ref 211–911)

## 2020-08-14 NOTE — Patient Instructions (Signed)
Your provider has requested that you go to the basement level for lab work before leaving today. Press "B" on the elevator. The lab is located at the first door on the left as you exit the elevator.   You have been scheduled for a bone density test on _______ at _______. Please arrive 15 minutes prior to your scheduled appointment to radiology on the basement floor of [place] location for this test. If you need to cancel or reschedule for any reason, please contact radiology at [phone].  Preparation for test is as follows:  If you are taking calcium, discontinue this 24-48 hours prior to your appointment.  Wear pants with an elastic waistband (or without any metal such as a zipper).  Do not wear an underwire bra.  We do have gowns if you are unable to find appropriate clothing without metal.  Please bring a list of all current medications.  Continue Apriso   Follow up in 6 months  Due to recent changes in healthcare laws, you may see the results of your imaging and laboratory studies on MyChart before your provider has had a chance to review them.  We understand that in some cases there may be results that are confusing or concerning to you. Not all laboratory results come back in the same time frame and the provider may be waiting for multiple results in order to interpret others.  Please give [place] 48 hours in order for your provider to thoroughly review all the results before contacting the office for clarification of your results.    If you are age 22 or older, your body mass index should be between 23-30. Your Body mass index is 21.73 kg/m. If this is out of the aforementioned range listed, please consider follow up with your Primary Care Provider.  If you are age 51 or younger, your body mass index should be between 19-25. Your Body mass index is 21.73 kg/m. If this is out of the aformentioned range listed, please consider follow up with your Primary Care Provider.    __________________________________________________________  The [facility] providers would like to encourage you to use [facility] to communicate with providers for non-urgent requests or questions.  Due to long hold times on the telephone, sending your provider a message by [facility] may be a faster and more efficient way to get a response.  Please allow 48 business hours for a response.  Please remember that this is for non-urgent requests.    I appreciate the  opportunity to care for you  Thank You   Harl Bowie , MD

## 2020-08-14 NOTE — Progress Notes (Signed)
Joseph Booker    [id number]    1950/06/27  Primary Care Physician:Burns, Claudina Lick, MD  Referring Physician: Binnie Rail, MD [place],  [place] [address]   Chief complaint: Ulcerative colitis  HPI: 70 year old very pleasant gentleman with history of chronic ulcerative colitis, was in clinical remission on daily mesalamine and colon recent episode of acute flare in April 2022. He was treated with tapered dose of budesonide.  After taking it for 2 months he was experiencing side effects and has since stopped taking it. He is currently on mesalamine with no symptoms.  Denies any rectal bleeding or change in bowel habits.  Prior to the episode of UC flare, he was on chronic antibiotics for ocular rosacea.  He has stopped taking antibiotics now and is using probiotic VSL 3  Colonoscopy - The perianal and digital rectal examinations were normal. - Inflammation characterized by altered vascularity, congestion (edema), erythema and scarring was found as medium patches surrounded by normal mucosa in the rectum, in the descending colon, in the splenic flexure, in the transverse colon, in the ascending colon and at the cecum. This was mild in severity, and when compared to previous examinations, the findings are worsened. Biopsies were taken with a cold forceps for histology. - Non-bleeding internal hemorrhoids were found during retroflexion. The hemorrhoids were medium-sized.  1. Surgical [P], right colon - MILDLY ACTIVE CHRONIC COLITIS, CONSISTENT WITH PATIENT'S CLINICAL HISTORY OF ULCERATIVE COLITIS - NEGATIVE FOR GRANULOMAS OR DYSPLASIA 2. Surgical [P], left colon - COLONIC MUCOSA WITH NO SPECIFIC HISTOPATHOLOGIC CHANGES - NEGATIVE FOR ACUTE INFLAMMATION, FEATURES OF CHRONICITY, GRANULOMAS OR DYSPLASIA   Outpatient Encounter Medications as of 08/14/2020  Medication Sig   aspirin 81 MG tablet Take 81 mg by mouth daily.   clopidogrel (PLAVIX) 75 MG tablet  TAKE 1 TABLET (75 MG TOTAL) BY MOUTH DAILY.   Coenzyme Q10 (CO Q-10) 300 MG CAPS Take 1 tablet by mouth every morning.    latanoprost (XALATAN) 0.005 % ophthalmic solution Place 1 drop into the left eye daily.   mesalamine (APRISO) 0.375 g 24 hr capsule TAKE 4 CAPSULES BY MOUTH EVERY DAY   Multiple Vitamin (MULTIVITAMIN) tablet Take 1 tablet by mouth daily.   Probiotic Product (VSL#3) CAPS Take 1 capsule by mouth daily.   REPATHA SURECLICK 559 MG/ML SOAJ INJECT 140 MG INTO THE SKIN EVERY 14 (FOURTEEN) DAYS.   timolol (TIMOPTIC) 0.5 % ophthalmic solution Place 1 drop into both eyes 2 (two) times daily.    [DISCONTINUED] budesonide (ENTOCORT EC) 3 MG 24 hr capsule Take 3 capsules (9 mg total) by mouth daily. (Patient taking differently: Take 3 mg by mouth daily.)   No facility-administered encounter medications on file as of 08/14/2020.    Allergies as of 08/14/2020 - Review Complete 08/14/2020  Allergen Reaction Noted   Atorvastatin Other (See Comments) 04/04/2019    Past Medical History:  Diagnosis Date   Allergic rhinitis    Allergy    Anxiety state, unspecified    Ascending aortic aneurysm ([place]) 05/15/2020   CAD (coronary artery disease)    [name] / nuclear 2005, excellent tolerance no scar or ischemia, EF 51% / nuclear, February, 2012, no scar or ischemia / catheterization March 26, 2010.. 10% in-stent restenosis, minimal other nonobstructive coronary disease, ejection fraction 55%., excellent result; ETT 9/13: ex 13:14, no CP, no ischemic ECG changes   Cancer (HCC)    basal cell carcinoma x2 on  face   Cataract    surgery to both eyes   Elevated blood pressure reading 05/15/2020   Glaucoma, left eye    left eye   History of exercise intolerance 05/23/2013   cardiopulmonary exercise test-- normal functional capacity when compared to matched sedentary norms, borderline ventilatory limitation with the patient's exercise tidal volume reaching 90% of the measured  inspiratory capacity ( pt reports no dyspnea), did not appear to be circulatory limitation, normal CPX test   Hyperlipidemia    Nocturia    Osteopenia    Thoracic ascending aortic aneurysm [facility])    followed by dr Skeet Latch---  last CT 12-29-2016 ,  4.5cm   Ulcerative colitis, universal ([place])    GI-- [name]--  dx 1990s  (per note mild Mayo Score 1)   Wears glasses     Past Surgical History:  Procedure Laterality Date   CARDIAC CATHETERIZATION  03/26/2010    dr Dian Queen   single vessel CAD w/ patent mLAD stent w/ 10% in-stent restenosis w/ mild to moderate disease in the remainder LAD w/ no lesions that appearedto be flow-limitinf/  normal LVFSF, ef 55%   CARDIOVASCULAR STRESS TEST  06-30-2016   [name]   Low risk nuclear study w/ no evidence ishcemia/  normal LV function and wall motion,  nuclear stress ef 55%   COLONOSCOPY  last one 10-27-2016   CORONARY ANGIOPLASTY WITH STENT PLACEMENT  07-25-2002   [name]   PTCA w/  DES x1 to [place] Left 2006   INGUINAL HERNIA REPAIR Right 07/03/2017   Procedure: OPEN REPAIR OF RIGHT INGUINAL HERNIA WITH MESH;  Surgeon: Kinsinger, Arta Bruce, MD;  Location: [place];  Service: General;  Laterality: Right;   INSERTION OF MESH Right 07/03/2017   Procedure: INSERTION OF MESH;  Surgeon: Kinsinger, Arta Bruce, MD;  Location: [place];  Service: General;  Laterality: Right;   SEPTOPLASTY  1987   w/ [place] ECHOCARDIOGRAM  06-30-2016     dr Alison Murray 55-60%/  trivial AR/ mild dilated ascending aorta, 20m/  trivial MR/  mild PR and TR     Family History  Problem Relation Age of Onset   Diabetes Mother    Heart disease Mother    Lung disease Mother        ILD   Rheum arthritis Mother    CAD Mother    Heart disease Father 520      MI   Heart attack Father    Asthma Sister    Colon cancer Neg Hx    Esophageal cancer Neg Hx     Pancreatic cancer Neg Hx    Kidney disease Neg Hx    Liver disease Neg Hx    Rectal cancer Neg Hx    Stomach cancer Neg Hx     Social History   Socioeconomic History   Marital status: Married    Spouse name: Not on file   Number of children: 1   Years of education: Not on file   Highest education level: Not on file  Occupational History   Occupation: retired    EF[employer] farm manager RETIRED  Tobacco Use   Smoking status: Never   Smokeless tobacco: Never   Tobacco comments:    Married, 1 stepson, 5 g-kids. Retired aEmergency planning/management officer now vEnvironmental health practitionerUse: Never used  Substance and Sexual  Activity   Alcohol use: Yes    Comment: occasional wine   Drug use: No   Sexual activity: Yes    Partners: Female  Other Topics Concern   Not on file  Social History Narrative   Exercising regularly: walks at least 5 miles a day, 6 days a week   Social Determinants of Health   Financial Resource Strain: Not on file  Food Insecurity: Not on file  Transportation Needs: Not on file  Physical Activity: Not on file  Stress: Not on file  Social Connections: Not on file  Intimate Partner Violence: Not on file      Review of systems: All other review of systems negative except as mentioned in the HPI.   Physical Exam: Vitals:   08/14/20 0956  BP: 100/60  Pulse: (!) 51   Body mass index is 21.73 kg/m. Gen:      No acute distress Neuro: alert and oriented x 3 Psych: normal mood and affect  Data Reviewed:  Reviewed labs, radiology imaging, old records and pertinent past GI work up   Assessment and Plan/Recommendations:  70 year old very pleasant gentleman with chronic ulcerative colitis, recent mild flare treated with budesonide taper Currently in clinical remission and is asymptomatic Continue mesalamine [Apriso] 1.5 g daily Continue VSL 3 for additional 2 to 4 weeks and advised patient to use probiotic after he has to take a course of antibiotic and to limit use of  antibiotics if possible  IBD health maintenance: Check bone density scan, B12, iron panel, sed rate, vitamin D, CBC, CMP Return in 6 months or sooner if needed  The patient was provided an opportunity to ask questions and all were answered. The patient agreed with the plan and demonstrated an understanding of the instructions.  Damaris Hippo , MD    CC: Binnie Rail, MD

## 2020-08-16 ENCOUNTER — Encounter (HOSPITAL_BASED_OUTPATIENT_CLINIC_OR_DEPARTMENT_OTHER): Payer: Self-pay

## 2020-08-17 ENCOUNTER — Encounter: Payer: Self-pay | Admitting: Gastroenterology

## 2020-08-21 DIAGNOSIS — L821 Other seborrheic keratosis: Secondary | ICD-10-CM | POA: Diagnosis not present

## 2020-08-21 DIAGNOSIS — D2272 Melanocytic nevi of left lower limb, including hip: Secondary | ICD-10-CM | POA: Diagnosis not present

## 2020-08-30 NOTE — Progress Notes (Signed)
Office Visit    Patient Name: Joseph Booker Date of Encounter: 08/31/2020  PCP:  Binnie Rail, MD   [place]  Cardiologist:  Skeet Latch, MD  Advanced Practice Provider:  No care team member to display Electrophysiologist:  None   Chief Complaint    Joseph Booker is a 70 y.o. male with a hx of CAD s/p PCI of LAD in 2004, HLD, mild ascending aorta aneurysm presents today for chest pain   Past Medical History    Past Medical History:  Diagnosis Date   Allergic rhinitis    Allergy    Anxiety state, unspecified    Ascending aortic aneurysm ([place]) 05/15/2020   CAD (coronary artery disease)    [name] / nuclear 2005, excellent tolerance no scar or ischemia, EF 51% / nuclear, February, 2012, no scar or ischemia / catheterization March 26, 2010.. 10% in-stent restenosis, minimal other nonobstructive coronary disease, ejection fraction 55%., excellent result; ETT 9/13: ex 13:14, no CP, no ischemic ECG changes   Cancer (HCC)    basal cell carcinoma x2 on face   Cataract    surgery to both eyes   Elevated blood pressure reading 05/15/2020   Glaucoma, left eye    left eye   History of exercise intolerance 05/23/2013   cardiopulmonary exercise test-- normal functional capacity when compared to matched sedentary norms, borderline ventilatory limitation with the patient's exercise tidal volume reaching 90% of the measured inspiratory capacity ( pt reports no dyspnea), did not appear to be circulatory limitation, normal CPX test   Hyperlipidemia    Nocturia    Osteopenia    Thoracic ascending aortic aneurysm [facility])    followed by dr Skeet Latch---  last CT 12-29-2016 ,  4.5cm   Ulcerative colitis, universal ([place])    GI-- [name]--  dx 1990s  (per note mild Mayo Score 1)   Wears glasses    Past Surgical History:  Procedure Laterality Date   CARDIAC CATHETERIZATION  03/26/2010    dr Dian Queen   single vessel CAD w/ patent mLAD stent w/ 10%  in-stent restenosis w/ mild to moderate disease in the remainder LAD w/ no lesions that appearedto be flow-limitinf/  normal LVFSF, ef 55%   CARDIOVASCULAR STRESS TEST  06-30-2016   [name]   Low risk nuclear study w/ no evidence ishcemia/  normal LV function and wall motion,  nuclear stress ef 55%   COLONOSCOPY  last one 10-27-2016   CORONARY ANGIOPLASTY WITH STENT PLACEMENT  07-25-2002   [name]   PTCA w/  DES x1 to [place] Left 2006   INGUINAL HERNIA REPAIR Right 07/03/2017   Procedure: OPEN REPAIR OF RIGHT INGUINAL HERNIA WITH MESH;  Surgeon: Kinsinger, Arta Bruce, MD;  Location: [place];  Service: General;  Laterality: Right;   INSERTION OF MESH Right 07/03/2017   Procedure: INSERTION OF MESH;  Surgeon: Kinsinger, Arta Bruce, MD;  Location: [place];  Service: General;  Laterality: Right;   SEPTOPLASTY  1987   w/ [place] ECHOCARDIOGRAM  06-30-2016     dr Alison Murray 55-60%/  trivial AR/ mild dilated ascending aorta, 44m/  trivial MR/  mild PR and TR     Allergies  Allergies  Allergen Reactions   Atorvastatin Other (See Comments)    transaminits Transaminits, causes liver enzymes to go up    History of Present  Illness    Joseph Booker is a 70 y.o. male with a hx of  CAD s/p PCI of LAD in 2004, HLD, mild ascending aorta aneurysm last seen 06/12/20 by Dr. Oval Linsey.  CPX in 2015 which was normal. Longstanding history of non exertional shortness of breath for which he has seen an allergist who suggested he might have inflammation in his lungs. Lexiscan myoview 06/2016 with LVEF 55% and no ischemia. Previous transaminitis on Atorvastatin and previously transitioned to [place].   CT 04/29/20 with stable ascending aortic aneurysm 4.5 cm.  He was seen 06/12/20 noting feeling significantly short of breath while carrying his dog. He was in the process of weaning of budesonide.  He had  stress test 06/19/20 with exceptional functional capacity (13:01 mins, 13.6 METs), frequent PVCs, no evidence of ischemia or infarction. Given EF was 48% by stress test, echocardiogram was ordered. Echo 07/17/20 with LVEF by 3D volume 67%, no RWMA, normal LVEDP, RV normal size and function, normal PASP, trivial MR, mild dilation of ascending aorta 35m and aortic root 459m   Presents today for follow up after phone call reporting chest pain. 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 Difficult to establish clear pattern. Occurs both at rest and with activity. Tells me this feels somewhat similar to his discomfort prior to his stent to LAD in 2004. He walks 3 miles three times per week with occasional dyspnea. Tells me he predominantly notes chest discomfort and dyspnea when carrying weight such as his dog. Tells me he worries whether he has another blockage such as the 70% blockage that led to his stent. We discussed his EKG today which shows frequent PVC - he notes occasional palpitations.   EKGs/Labs/Other Studies Reviewed:   The following studies were reviewed today:  Echocardiogram 07/17/20  1. Left ventricular ejection fraction, by estimation, is 60 to 65%. Left  ventricular ejection fraction by 3D volume is 67 %. The left ventricle has  normal function. The left ventricle has no regional wall motion  abnormalities. Left ventricular diastolic   parameters were normal.   2. Right ventricular systolic function is normal. The right ventricular  size is normal. There is normal pulmonary artery systolic pressure. The  estimated right ventricular systolic pressure is 1981.1mHg.   3. The mitral valve is normal in structure. Trivial mitral valve  regurgitation. No evidence of mitral stenosis.   4. The aortic valve is tricuspid. Aortic valve regurgitation is mild. No  aortic stenosis is present.   5. Aortic dilatation noted. There is mild dilatation of the ascending   aorta, measuring 43 mm. There is mild dilatation of the aortic root,  measuring 40 mm.   6. The inferior vena cava is normal in size with greater than 50%  respiratory variability, suggesting right atrial pressure of 3 mmHg.   Comparison(s): No significant change from prior study. Prior images  reviewed side by side.   [place] Myoview 06/19/20 The left ventricular ejection fraction is mildly decreased (45-54%). Nuclear stress EF: 48%. The study is normal. This is a low risk study.   1. Normal perfusion without evidence of ischemia or infarction. 2. Mildly reduced LVEF, 48%. Visually this appears normal. Would recommend an echocardiogram for better evaluation. 3. Exceptional function capacity (13:01 min:s; 13.6 METS).  4. Frequent PVCs during exercise (with couplets) and lasting into recovery.  5. Normal HR/BP response to exercise.   CT ANGIOGRAPHY CHEST WITH CONTRAST 04/29/2020: 1. Stable aneurysmal disease of the  ascending thoracic aorta measuring approximately 4.5 cm in greatest diameter. Ascending thoracic aortic aneurysm. Recommend semi-annual imaging followup by CTA or MRA and referral to cardiothoracic surgery if not already obtained. This recommendation follows 2010 ACCF/AHA/AATS/ACR/ASA/SCA/SCAI/SIR/STS/SVM Guidelines for the Diagnosis and Management of Patients With Thoracic Aortic Disease. Circulation. 2010; 121: X833-A250. Aortic aneurysm NOS (ICD10-I71.9) 2. Stable evidence of coronary atherosclerosis.   Echo 05/01/2019:  1. Left ventricular ejection fraction, by estimation, is 60 to 65%. The  left ventricle has normal function. The left ventricle has no regional  wall motion abnormalities. Left ventricular diastolic parameters were  normal.   2. Right ventricular systolic function is normal. The right ventricular  size is normal. There is normal pulmonary artery systolic pressure.   3. The mitral valve is normal in structure. Trivial mitral valve  regurgitation. No  evidence of mitral stenosis.   4. The aortic valve is tricuspid. Aortic valve regurgitation is trivial.  No aortic stenosis is present.   5. 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 Aortic dilatation noted. There is  moderate dilatation of the aortic root.   6. The inferior vena cava is normal in size with greater than 50%  respiratory variability, suggesting right atrial pressure of 3 mmHg.    Echo 02/14/18:    1. Sinus bradycardia in the upper 40s throughout study.  2. Moderate dilatation of the ascending aorta.  3. Aneurysm of the ascending aorta, measured at 4.5 cm. Recommend dedidated imaging ot aorta with CT or MRI if clinically indicated and not recently performed.  4. The left ventricle appears to be normal in size, have normal wall thickness, with EF of 60-65%. Echo evidence of normal diastolic filling patterns.  5. GLS -19.9%.  6. Right ventricular systolic pressure is is normal.  7. The right ventricle is normal in size, has normal wall thickness and normal systolic function.  8. Normal left atrial size.  9. Normal right atrial size. 10. Mild mitral annular calcification. 11. Mitral valve regurgitation is mild by color flow Doppler. 12. The mitral valve normal in structure and function. 13. Normal tricuspid valve. 14. Aortic valve normal. 15. Aortic valve regurgitation is mild by color flow Doppler. 16. There is mild calcification of the aortic valve. 17. There is mild thickening of the aortic valve. 18. No atrial level shunt detected by color flow Doppler. 19. Tricuspid regurgitation is mild.   Coronary CT-A 12/29/16:  Ascending aneurysm 4.5 cm. <50% LM stenosis.  Patent proximal LAD stent.    Lexiscan Myoview 06/2016: Nuclear stress EF: 55%. Blood pressure demonstrated a hypertensive response to exercise. There was no ST segment deviation noted during stress. The study is normal. This is a low risk  study. The left ventricular ejection fraction is normal (55-65%).   Normal resting and stress perfusion. No ischemia or infarction EF 55%   Echo 06/30/16: Nuclear stress EF: 55%. Blood pressure demonstrated a hypertensive response to exercise. There was no ST segment deviation noted during stress. The study is normal. This is a low risk study. The left ventricular ejection fraction is normal (55-65%).   Normal resting and stress perfusion. No ischemia or infarction EF 55%   CPX 05/23/13: Normal  EKG:  EKG is ordered today.  The ekg ordered today demonstrates NSR 66 bpm with PVC's, left axis deviation, and no acute ST/T wave changes.   Recent Labs: 08/14/2020: ALT 17; BUN 18; Creatinine, Ser 1.00; Hemoglobin 15.3; Platelets 153.0;  Potassium 4.1; Sodium 141  Recent Lipid Panel    Component Value Date/Time   CHOL 135 12/30/2019 0908   TRIG 94 12/30/2019 0908   TRIG 46 12/19/2008 0000   HDL 56 12/30/2019 0908   CHOLHDL 2.4 12/30/2019 0908   CHOLHDL 4 03/25/2016 0914   VLDL 18.2 03/25/2016 0914   LDLCALC 61 12/30/2019 0908   Home Medications   Current Meds  Medication Sig   aspirin 81 MG tablet Take 81 mg by mouth daily.   clopidogrel (PLAVIX) 75 MG tablet TAKE 1 TABLET (75 MG TOTAL) BY MOUTH DAILY.   Coenzyme Q10 (CO Q-10) 300 MG CAPS Take 1 tablet by mouth every morning.    latanoprost (XALATAN) 0.005 % ophthalmic solution Place 1 drop into the left eye daily.   mesalamine (APRISO) 0.375 g 24 hr capsule TAKE 4 CAPSULES BY MOUTH EVERY DAY   Probiotic Product (VSL#3) CAPS Take 1 capsule by mouth daily.   REPATHA SURECLICK 619 MG/ML SOAJ INJECT 140 MG INTO THE SKIN EVERY 14 (FOURTEEN) DAYS.   timolol (TIMOPTIC) 0.5 % ophthalmic solution Place 1 drop into both eyes 2 (two) times daily.      Review of Systems     All other systems reviewed and are otherwise negative except as noted above.  Physical Exam    VS:  BP 112/78 (BP Location: Left Arm, Patient Position: Sitting,  Cuff Size: Normal)   Pulse 66   Ht 5' 8.5" (1.74 m)   Wt 146 lb 6.4 oz (66.4 kg)   BMI 21.94 kg/m  , BMI Body mass index is 21.94 kg/m.  Wt Readings from Last 3 Encounters:  08/31/20 146 lb 6.4 oz (66.4 kg)  08/14/20 145 lb (65.8 kg)  06/19/20 146 lb (66.2 kg)     GEN: Well nourished, well developed, in no acute distress. HEENT: normal. Neck: Supple, no JVD, carotid bruits, or masses. Cardiac: RRR, no murmurs, rubs, or gallops. No clubbing, cyanosis, edema.  Radials/PT 2+ and equal bilaterally.  Respiratory:  Respirations regular and unlabored, clear to auscultation bilaterally. GI: Soft, nontender, nondistended. MS: No deformity or atrophy. Skin: Warm and dry, no rash. Neuro:  Strength and sensation are intact. Psych: Normal affect.  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 Unchanged from previous study 2019. No beta blocker due to bradycardia. Continue optimal blood pressure control.   SOB / CAD / Chest pain - Previous stent to LAD in 2004. Notes continued chest pain at rest and with activity. Stress test 06/19/20 low risk study. Echo 07/17/20 normal LVEF, no RWMA. However, reports continued chest pain with symptoms consistent anginal symptoms prior to LAD stent in 2004. EKG today no acute ST/T wave changes. Given persistent chest pain, plan for left cardiac catheterization. GDMT includes aspirin, Plavix, Repatha. No BB due to baseline bradycardia.   Shared Decision Making/Informed Consent The risks [stroke (1 in 1000), death (1 in 1000), kidney failure [usually temporary] (1 in 500), bleeding (1 in 200), allergic reaction [possibly serious] (1 in 200)], benefits (diagnostic support and management of coronary artery disease) and alternatives of a cardiac catheterization were discussed in detail with Mr. Getty and he is willing to proceed.   PVC - Noted by EKG today. Does drink of caffeine, encouraged to reduce. No alcohol nor caffeine use. Consider ZIO  monitor at follow up.   Disposition: Follow up  2 weeks after cardiac cahteterization  with Dr. Oval Linsey or APP.  Signed,  Loel Dubonnet, NP 08/31/2020, 7:58 PM [facility]

## 2020-08-30 NOTE — H&P (View-Only) (Signed)
Office Visit    Patient Name: Joseph Booker Date of Encounter: 08/31/2020  PCP:  Binnie Rail, MD   [place]  Cardiologist:  Skeet Latch, MD  Advanced Practice Provider:  No care team member to display Electrophysiologist:  None   Chief Complaint    Joseph Booker is a 70 y.o. male with a hx of CAD s/p PCI of LAD in 2004, HLD, mild ascending aorta aneurysm presents today for chest pain   Past Medical History    Past Medical History:  Diagnosis Date   Allergic rhinitis    Allergy    Anxiety state, unspecified    Ascending aortic aneurysm ([place]) 05/15/2020   CAD (coronary artery disease)    [name] / nuclear 2005, excellent tolerance no scar or ischemia, EF 51% / nuclear, February, 2012, no scar or ischemia / catheterization March 26, 2010.. 10% in-stent restenosis, minimal other nonobstructive coronary disease, ejection fraction 55%., excellent result; ETT 9/13: ex 13:14, no CP, no ischemic ECG changes   Cancer (HCC)    basal cell carcinoma x2 on face   Cataract    surgery to both eyes   Elevated blood pressure reading 05/15/2020   Glaucoma, left eye    left eye   History of exercise intolerance 05/23/2013   cardiopulmonary exercise test-- normal functional capacity when compared to matched sedentary norms, borderline ventilatory limitation with the patient's exercise tidal volume reaching 90% of the measured inspiratory capacity ( pt reports no dyspnea), did not appear to be circulatory limitation, normal CPX test   Hyperlipidemia    Nocturia    Osteopenia    Thoracic ascending aortic aneurysm [facility])    followed by dr Skeet Latch---  last CT 12-29-2016 ,  4.5cm   Ulcerative colitis, universal ([place])    GI-- dr Joseph Decamp--  dx 1990s  (per note mild Mayo Score 1)   Wears glasses    Past Surgical History:  Procedure Laterality Date   CARDIAC CATHETERIZATION  03/26/2010    dr Dian Queen   single vessel CAD w/ patent mLAD stent w/ 10%  in-stent restenosis w/ mild to moderate disease in the remainder LAD w/ no lesions that appearedto be flow-limitinf/  normal LVFSF, ef 55%   CARDIOVASCULAR STRESS TEST  06-30-2016   [name]   Low risk nuclear study w/ no evidence ishcemia/  normal LV function and wall motion,  nuclear stress ef 55%   COLONOSCOPY  last one 10-27-2016   CORONARY ANGIOPLASTY WITH STENT PLACEMENT  07-25-2002   [name]   PTCA w/  DES x1 to [place] Left 2006   INGUINAL HERNIA REPAIR Right 07/03/2017   Procedure: OPEN REPAIR OF RIGHT INGUINAL HERNIA WITH MESH;  Surgeon: Kinsinger, Arta Bruce, MD;  Location: [place];  Service: General;  Laterality: Right;   INSERTION OF MESH Right 07/03/2017   Procedure: INSERTION OF MESH;  Surgeon: Kinsinger, Arta Bruce, MD;  Location: [place];  Service: General;  Laterality: Right;   SEPTOPLASTY  1987   w/ [place] ECHOCARDIOGRAM  06-30-2016     dr Alison Murray 55-60%/  trivial AR/ mild dilated ascending aorta, 13m/  trivial MR/  mild PR and TR     Allergies  Allergies  Allergen Reactions   Atorvastatin Other (See Comments)    transaminits Transaminits, causes liver enzymes to go up    History of Present  Illness    Claud Booker is a 70 y.o. male with a hx of  CAD s/p PCI of LAD in 2004, HLD, mild ascending aorta aneurysm last seen 06/12/20 by Dr. Oval Linsey.  CPX in 2015 which was normal. Longstanding history of non exertional shortness of breath for which he has seen an allergist who suggested he might have inflammation in his lungs. Lexiscan myoview 06/2016 with LVEF 55% and no ischemia. Previous transaminitis on Atorvastatin and previously transitioned to [name].   CT 04/29/20 with stable ascending aortic aneurysm 4.5 cm.  He was seen 06/12/20 noting feeling significantly short of breath while carrying his dog. He was in the process of weaning of budesonide.  He had  stress test 06/19/20 with exceptional functional capacity (13:01 mins, 13.6 METs), frequent PVCs, no evidence of ischemia or infarction. Given EF was 48% by stress test, echocardiogram was ordered. Echo 07/17/20 with LVEF by 3D volume 67%, no RWMA, normal LVEDP, RV normal size and function, normal PASP, trivial MR, mild dilation of ascending aorta 22m and aortic root 42m   Presents today for follow up after phone call reporting chest pain. 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 Difficult to establish clear pattern. Occurs both at rest and with activity. Tells me this feels somewhat similar to his discomfort prior to his stent to LAD in 2004. He walks 3 miles three times per week with occasional dyspnea. Tells me he predominantly notes chest discomfort and dyspnea when carrying weight such as his dog. Tells me he worries whether he has another blockage such as the 70% blockage that led to his stent. We discussed his EKG today which shows frequent PVC - he notes occasional palpitations.   EKGs/Labs/Other Studies Reviewed:   The following studies were reviewed today:  Echocardiogram 07/17/20  1. Left ventricular ejection fraction, by estimation, is 60 to 65%. Left  ventricular ejection fraction by 3D volume is 67 %. The left ventricle has  normal function. The left ventricle has no regional wall motion  abnormalities. Left ventricular diastolic   parameters were normal.   2. Right ventricular systolic function is normal. The right ventricular  size is normal. There is normal pulmonary artery systolic pressure. The  estimated right ventricular systolic pressure is 1970.0mHg.   3. The mitral valve is normal in structure. Trivial mitral valve  regurgitation. No evidence of mitral stenosis.   4. The aortic valve is tricuspid. Aortic valve regurgitation is mild. No  aortic stenosis is present.   5. Aortic dilatation noted. There is mild dilatation of the ascending   aorta, measuring 43 mm. There is mild dilatation of the aortic root,  measuring 40 mm.   6. The inferior vena cava is normal in size with greater than 50%  respiratory variability, suggesting right atrial pressure of 3 mmHg.   Comparison(s): No significant change from prior study. Prior images  reviewed side by side.   [place] Myoview 06/19/20 The left ventricular ejection fraction is mildly decreased (45-54%). Nuclear stress EF: 48%. The study is normal. This is a low risk study.   1. Normal perfusion without evidence of ischemia or infarction. 2. Mildly reduced LVEF, 48%. Visually this appears normal. Would recommend an echocardiogram for better evaluation. 3. Exceptional function capacity (13:01 min:s; 13.6 METS).  4. Frequent PVCs during exercise (with couplets) and lasting into recovery.  5. Normal HR/BP response to exercise.   CT ANGIOGRAPHY CHEST WITH CONTRAST 04/29/2020: 1. Stable aneurysmal disease of the  ascending thoracic aorta measuring approximately 4.5 cm in greatest diameter. Ascending thoracic aortic aneurysm. Recommend semi-annual imaging followup by CTA or MRA and referral to cardiothoracic surgery if not already obtained. This recommendation follows 2010 ACCF/AHA/AATS/ACR/ASA/SCA/SCAI/SIR/STS/SVM Guidelines for the Diagnosis and Management of Patients With Thoracic Aortic Disease. Circulation. 2010; 121: O887-N797. Aortic aneurysm NOS (ICD10-I71.9) 2. Stable evidence of coronary atherosclerosis.   Echo 05/01/2019:  1. Left ventricular ejection fraction, by estimation, is 60 to 65%. The  left ventricle has normal function. The left ventricle has no regional  wall motion abnormalities. Left ventricular diastolic parameters were  normal.   2. Right ventricular systolic function is normal. The right ventricular  size is normal. There is normal pulmonary artery systolic pressure.   3. The mitral valve is normal in structure. Trivial mitral valve  regurgitation. No  evidence of mitral stenosis.   4. The aortic valve is tricuspid. Aortic valve regurgitation is trivial.  No aortic stenosis is present.   5. 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 Aortic dilatation noted. There is  moderate dilatation of the aortic root.   6. The inferior vena cava is normal in size with greater than 50%  respiratory variability, suggesting right atrial pressure of 3 mmHg.    Echo 02/14/18:    1. Sinus bradycardia in the upper 40s throughout study.  2. Moderate dilatation of the ascending aorta.  3. Aneurysm of the ascending aorta, measured at 4.5 cm. Recommend dedidated imaging ot aorta with CT or MRI if clinically indicated and not recently performed.  4. The left ventricle appears to be normal in size, have normal wall thickness, with EF of 60-65%. Echo evidence of normal diastolic filling patterns.  5. GLS -19.9%.  6. Right ventricular systolic pressure is is normal.  7. The right ventricle is normal in size, has normal wall thickness and normal systolic function.  8. Normal left atrial size.  9. Normal right atrial size. 10. Mild mitral annular calcification. 11. Mitral valve regurgitation is mild by color flow Doppler. 12. The mitral valve normal in structure and function. 13. Normal tricuspid valve. 14. Aortic valve normal. 15. Aortic valve regurgitation is mild by color flow Doppler. 16. There is mild calcification of the aortic valve. 17. There is mild thickening of the aortic valve. 18. No atrial level shunt detected by color flow Doppler. 19. Tricuspid regurgitation is mild.   Coronary CT-A 12/29/16:  Ascending aneurysm 4.5 cm. <50% LM stenosis.  Patent proximal LAD stent.    Lexiscan Myoview 06/2016: Nuclear stress EF: 55%. Blood pressure demonstrated a hypertensive response to exercise. There was no ST segment deviation noted during stress. The study is normal. This is a low risk  study. The left ventricular ejection fraction is normal (55-65%).   Normal resting and stress perfusion. No ischemia or infarction EF 55%   Echo 06/30/16: Nuclear stress EF: 55%. Blood pressure demonstrated a hypertensive response to exercise. There was no ST segment deviation noted during stress. The study is normal. This is a low risk study. The left ventricular ejection fraction is normal (55-65%).   Normal resting and stress perfusion. No ischemia or infarction EF 55%   CPX 05/23/13: Normal  EKG:  EKG is ordered today.  The ekg ordered today demonstrates NSR 66 bpm with PVC's, left axis deviation, and no acute ST/T wave changes.   Recent Labs: 08/14/2020: ALT 17; BUN 18; Creatinine, Ser 1.00; Hemoglobin 15.3; Platelets 153.0;  Potassium 4.1; Sodium 141  Recent Lipid Panel    Component Value Date/Time   CHOL 135 12/30/2019 0908   TRIG 94 12/30/2019 0908   TRIG 46 12/19/2008 0000   HDL 56 12/30/2019 0908   CHOLHDL 2.4 12/30/2019 0908   CHOLHDL 4 03/25/2016 0914   VLDL 18.2 03/25/2016 0914   LDLCALC 61 12/30/2019 0908   Home Medications   Current Meds  Medication Sig   aspirin 81 MG tablet Take 81 mg by mouth daily.   clopidogrel (PLAVIX) 75 MG tablet TAKE 1 TABLET (75 MG TOTAL) BY MOUTH DAILY.   Coenzyme Q10 (CO Q-10) 300 MG CAPS Take 1 tablet by mouth every morning.    latanoprost (XALATAN) 0.005 % ophthalmic solution Place 1 drop into the left eye daily.   mesalamine (APRISO) 0.375 g 24 hr capsule TAKE 4 CAPSULES BY MOUTH EVERY DAY   Probiotic Product (VSL#3) CAPS Take 1 capsule by mouth daily.   REPATHA SURECLICK 932 MG/ML SOAJ INJECT 140 MG INTO THE SKIN EVERY 14 (FOURTEEN) DAYS.   timolol (TIMOPTIC) 0.5 % ophthalmic solution Place 1 drop into both eyes 2 (two) times daily.      Review of Systems     All other systems reviewed and are otherwise negative except as noted above.  Physical Exam    VS:  BP 112/78 (BP Location: Left Arm, Patient Position: Sitting,  Cuff Size: Normal)   Pulse 66   Ht 5' 8.5" (1.74 m)   Wt 146 lb 6.4 oz (66.4 kg)   BMI 21.94 kg/m  , BMI Body mass index is 21.94 kg/m.  Wt Readings from Last 3 Encounters:  08/31/20 146 lb 6.4 oz (66.4 kg)  08/14/20 145 lb (65.8 kg)  06/19/20 146 lb (66.2 kg)     GEN: Well nourished, well developed, in no acute distress. HEENT: normal. Neck: Supple, no JVD, carotid bruits, or masses. Cardiac: RRR, no murmurs, rubs, or gallops. No clubbing, cyanosis, edema.  Radials/PT 2+ and equal bilaterally.  Respiratory:  Respirations regular and unlabored, clear to auscultation bilaterally. GI: Soft, nontender, nondistended. MS: No deformity or atrophy. Skin: Warm and dry, no rash. Neuro:  Strength and sensation are intact. Psych: Normal affect.  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 Unchanged from previous study 2019. No beta blocker due to bradycardia. Continue optimal blood pressure control.   SOB / CAD / Chest pain - Previous stent to LAD in 2004. Notes continued chest pain at rest and with activity. Stress test 06/19/20 low risk study. Echo 07/17/20 normal LVEF, no RWMA. However, reports continued chest pain with symptoms consistent anginal symptoms prior to LAD stent in 2004. EKG today no acute ST/T wave changes. Given persistent chest pain, plan for left cardiac catheterization. GDMT includes aspirin, Plavix, Repatha. No BB due to baseline bradycardia.   Shared Decision Making/Informed Consent The risks [stroke (1 in 1000), death (1 in 1000), kidney failure [usually temporary] (1 in 500), bleeding (1 in 200), allergic reaction [possibly serious] (1 in 200)], benefits (diagnostic support and management of coronary artery disease) and alternatives of a cardiac catheterization were discussed in detail with Mr. Gillooly and he is willing to proceed.   PVC - Noted by EKG today. Does drink of caffeine, encouraged to reduce. No alcohol nor caffeine use. Consider ZIO  monitor at follow up.   Disposition: Follow up  2 weeks after cardiac cahteterization  with Dr. Oval Linsey or APP.  Signed,  Loel Dubonnet, NP 08/31/2020, 7:58 PM [facility]

## 2020-08-31 ENCOUNTER — Encounter (HOSPITAL_BASED_OUTPATIENT_CLINIC_OR_DEPARTMENT_OTHER): Payer: Self-pay | Admitting: Family

## 2020-08-31 ENCOUNTER — Ambulatory Visit (INDEPENDENT_AMBULATORY_CARE_PROVIDER_SITE_OTHER): Payer: Medicare Other | Admitting: Family

## 2020-08-31 ENCOUNTER — Other Ambulatory Visit: Payer: Self-pay

## 2020-08-31 VITALS — BP 112/78 | HR 66 | Ht 68.5 in | Wt 146.4 lb

## 2020-08-31 DIAGNOSIS — R0602 Shortness of breath: Secondary | ICD-10-CM | POA: Diagnosis not present

## 2020-08-31 DIAGNOSIS — I7121 Aneurysm of the ascending aorta, without rupture: Secondary | ICD-10-CM

## 2020-08-31 DIAGNOSIS — E785 Hyperlipidemia, unspecified: Secondary | ICD-10-CM | POA: Diagnosis not present

## 2020-08-31 DIAGNOSIS — I712 Thoracic aortic aneurysm, without rupture: Secondary | ICD-10-CM

## 2020-08-31 DIAGNOSIS — I25118 Atherosclerotic heart disease of native coronary artery with other forms of angina pectoris: Secondary | ICD-10-CM | POA: Diagnosis not present

## 2020-08-31 DIAGNOSIS — R079 Chest pain, unspecified: Secondary | ICD-10-CM

## 2020-08-31 NOTE — Patient Instructions (Addendum)
Medication Instructions:  No medication changes today.   *If you need a refill on your cardiac medications before your next appointment, please call your pharmacy*   Lab Work: Your physician recommends that you return for lab work for CBC, BMP one week prior to cardiac catheterization.   If you have labs (blood work) drawn today and your tests are completely normal, you will receive your results only by: [place] (if you have MyChart) OR A paper copy in the mail If you have any lab test that is abnormal or we need to change your treatment, we will call you to review the results.   Testing/Procedures: Your physician has requested that you have a cardiac catheterization. Cardiac catheterization is used to diagnose and/or treat various heart conditions. Doctors may recommend this procedure for a number of different reasons. The most common reason is to evaluate chest pain. Chest pain can be a symptom of coronary artery disease (CAD), and cardiac catheterization can show whether plaque is narrowing or blocking your heart's arteries. This procedure is also used to evaluate the valves, as well as measure the blood flow and oxygen levels in different parts of your heart.    Follow-Up: At [facility], you and your health needs are our priority.  As part of our continuing mission to provide you with exceptional heart care, we have created designated Provider Care Teams.  These Care Teams include your primary Cardiologist (physician) and Advanced Practice Providers (APPs -  Physician Assistants and Nurse Practitioners) who all work together to provide you with the care you need, when you need it.  We recommend signing up for the patient portal called "MyChart".  Sign up information is provided on this After Visit Summary.  MyChart is used to connect with patients for Virtual Visits (Telemedicine).  Patients are able to view lab/test results, encounter notes, upcoming appointments, etc.   Non-urgent messages can be sent to your provider as well.   To learn more about what you can do with MyChart, go to [email or web address].    Your next appointment:   2 week(s) after cardiac catheterization  Other Instructions   Joseph Booker [address] [address] Dept: [phone]  Joseph Booker  08/31/2020  You are scheduled for a Cardiac Catheterization on Friday, August 26 with Dr. Glenetta Hew.  1. Please arrive at the [facility] (Main Entrance A) at [facility]: [address] [place], [place] [address] at 7:00 AM (This time is two hours before your procedure to ensure your preparation). Free valet parking service is available.   Special note: Every effort is made to have your procedure done on time. Please understand that emergencies sometimes delay scheduled procedures.  2. Diet: Do not eat solid foods after midnight.  The patient may have clear liquids until 5am upon the day of the procedure.  3. Labs: You will need to have blood drawn on Monday, August 22 at [facility] do not need to be fasting.  4. Medication instructions in preparation for your procedure:  On the morning of your procedure, take your  Aspirin and Plavix (Clopidogrel)  and any other morning medicines. You may use sips of water.  5. Plan for one night stay--bring personal belongings. 6. Bring a current list of your medications and current insurance cards. 7. You MUST have a responsible person to drive you home. 8. Someone MUST be with you the first 24 hours after you arrive home  or your discharge will be delayed. 9. Please wear clothes that are easy to get on and off and wear slip-on shoes.  Thank you for allowing [place] to care for you!   -- [place]

## 2020-09-02 NOTE — Addendum Note (Signed)
Addended by: Loel Dubonnet on: 09/02/2020 08:17 AM   Modules accepted: Orders, SmartSet

## 2020-09-04 DIAGNOSIS — I25118 Atherosclerotic heart disease of native coronary artery with other forms of angina pectoris: Secondary | ICD-10-CM | POA: Diagnosis not present

## 2020-09-04 DIAGNOSIS — E785 Hyperlipidemia, unspecified: Secondary | ICD-10-CM | POA: Diagnosis not present

## 2020-09-05 LAB — CBC
Hematocrit: 46.7 % (ref 37.5–51.0)
Hemoglobin: 16.2 g/dL (ref 13.0–17.7)
MCH: 33.3 pg — ABNORMAL HIGH (ref 26.6–33.0)
MCHC: 34.7 g/dL (ref 31.5–35.7)
MCV: 96 fL (ref 79–97)
Platelets: 171 10*3/uL (ref 150–450)
RBC: 4.87 x10E6/uL (ref 4.14–5.80)
RDW: 11.8 % (ref 11.6–15.4)
WBC: 6.6 10*3/uL (ref 3.4–10.8)

## 2020-09-05 LAB — BASIC METABOLIC PANEL
BUN/Creatinine Ratio: 19 (ref 10–24)
BUN: 19 mg/dL (ref 8–27)
CO2: 23 mmol/L (ref 20–29)
Calcium: 9.5 mg/dL (ref 8.6–10.2)
Chloride: 106 mmol/L (ref 96–106)
Creatinine, Ser: 1 mg/dL (ref 0.76–1.27)
Glucose: 67 mg/dL (ref 65–99)
Potassium: 4.4 mmol/L (ref 3.5–5.2)
Sodium: 143 mmol/L (ref 134–144)
eGFR: 82 mL/min/{1.73_m2} (ref >59)

## 2020-09-07 DIAGNOSIS — H0288A Meibomian gland dysfunction right eye, upper and lower eyelids: Secondary | ICD-10-CM | POA: Diagnosis not present

## 2020-09-07 DIAGNOSIS — H04203 Unspecified epiphora, bilateral lacrimal glands: Secondary | ICD-10-CM | POA: Diagnosis not present

## 2020-09-07 DIAGNOSIS — L718 Other rosacea: Secondary | ICD-10-CM | POA: Diagnosis not present

## 2020-09-07 DIAGNOSIS — H0288B Meibomian gland dysfunction left eye, upper and lower eyelids: Secondary | ICD-10-CM | POA: Diagnosis not present

## 2020-09-07 DIAGNOSIS — H26492 Other secondary cataract, left eye: Secondary | ICD-10-CM | POA: Diagnosis not present

## 2020-09-07 DIAGNOSIS — Z961 Presence of intraocular lens: Secondary | ICD-10-CM | POA: Diagnosis not present

## 2020-09-07 DIAGNOSIS — H169 Unspecified keratitis: Secondary | ICD-10-CM | POA: Diagnosis not present

## 2020-09-10 ENCOUNTER — Telehealth: Payer: Self-pay | Admitting: *Deleted

## 2020-09-10 NOTE — Telephone Encounter (Signed)
Pt is returning call for Pre Op instructions

## 2020-09-10 NOTE — Telephone Encounter (Signed)
LMTCB to review procedure instructions

## 2020-09-10 NOTE — Telephone Encounter (Signed)
Voicemail message, no answer.

## 2020-09-10 NOTE — Telephone Encounter (Signed)
Cardiac catheterization scheduled at [facility] for: Friday September 11, 2020 [facility] Main Entrance A [facility]) at: 7 AM   No solid food after midnight prior to cath, clear liquids until 5 AM day of procedure.  Morning medications can be taken pre-cath with sips of water including: - aspirin 81 mg -Plavix 75 mg    Confirmed patient has responsible adult to drive home post procedure and be with patient first 24 hours after arriving home.  Patients are allowed one visitor in the waiting room during the time they are at the hospital for their procedure. Both patient and visitor must wear a mask once they enter the hospital.   Patient reports does not currently have any symptoms concerning for COVID-19 and no household members with COVID-19 like illness.      LMTCB to review procedure instructions with patient.

## 2020-09-10 NOTE — Telephone Encounter (Signed)
Reviewed cath instructions below with patient. He verbalized understanding and thanked me for the call.

## 2020-09-11 ENCOUNTER — Ambulatory Visit (HOSPITAL_COMMUNITY)
Admission: RE | Admit: 2020-09-11 | Discharge: 2020-09-11 | Disposition: A | Discharge status: Home / Self Care | Payer: Medicare Other | Attending: Interventional Cardiology | Admitting: Interventional Cardiology

## 2020-09-11 ENCOUNTER — Other Ambulatory Visit: Payer: Self-pay

## 2020-09-11 ENCOUNTER — Encounter (HOSPITAL_COMMUNITY)
Admission: RE | Disposition: A | Discharge status: Home / Self Care | Payer: Self-pay | Source: Home / Self Care | Attending: Interventional Cardiology

## 2020-09-11 DIAGNOSIS — Z955 Presence of coronary angioplasty implant and graft: Secondary | ICD-10-CM | POA: Insufficient documentation

## 2020-09-11 DIAGNOSIS — I25118 Atherosclerotic heart disease of native coronary artery with other forms of angina pectoris: Secondary | ICD-10-CM | POA: Diagnosis not present

## 2020-09-11 DIAGNOSIS — R0602 Shortness of breath: Secondary | ICD-10-CM

## 2020-09-11 DIAGNOSIS — Z888 Allergy status to other drugs, medicaments and biological substances status: Secondary | ICD-10-CM | POA: Insufficient documentation

## 2020-09-11 DIAGNOSIS — Z79899 Other long term (current) drug therapy: Secondary | ICD-10-CM | POA: Insufficient documentation

## 2020-09-11 DIAGNOSIS — R079 Chest pain, unspecified: Secondary | ICD-10-CM

## 2020-09-11 DIAGNOSIS — I712 Thoracic aortic aneurysm, without rupture: Secondary | ICD-10-CM | POA: Diagnosis not present

## 2020-09-11 DIAGNOSIS — Z7982 Long term (current) use of aspirin: Secondary | ICD-10-CM | POA: Insufficient documentation

## 2020-09-11 DIAGNOSIS — E785 Hyperlipidemia, unspecified: Secondary | ICD-10-CM | POA: Insufficient documentation

## 2020-09-11 DIAGNOSIS — Z7902 Long term (current) use of antithrombotics/antiplatelets: Secondary | ICD-10-CM | POA: Insufficient documentation

## 2020-09-11 HISTORY — PX: THORACIC AORTOGRAM: CATH118269

## 2020-09-11 HISTORY — PX: LEFT HEART CATH AND CORONARY ANGIOGRAPHY: CATH118249

## 2020-09-11 SURGERY — LEFT HEART CATH AND CORONARY ANGIOGRAPHY
Anesthesia: LOCAL

## 2020-09-11 MED ORDER — SODIUM CHLORIDE 0.9% FLUSH: 3.0000 mL | INTRAVENOUS | Status: DC | PRN

## 2020-09-11 MED ORDER — LIDOCAINE HCL (PF) 1 % IJ SOLN
INTRAMUSCULAR | Status: AC
  Filled 2020-09-11: qty 30

## 2020-09-11 MED ORDER — FENTANYL CITRATE (PF) 100 MCG/2ML IJ SOLN
INTRAMUSCULAR | Status: DC | PRN
  Administered 2020-09-11 (×2): 25 ug via INTRAVENOUS

## 2020-09-11 MED ORDER — SODIUM CHLORIDE 0.9 % IV SOLN: 250.0000 mL | INTRAVENOUS | Status: DC | PRN

## 2020-09-11 MED ORDER — HYDRALAZINE HCL 20 MG/ML IJ SOLN: 10.0000 mg | INTRAMUSCULAR | Status: DC | PRN

## 2020-09-11 MED ORDER — VERAPAMIL HCL 2.5 MG/ML IV SOLN
INTRAVENOUS | Status: DC | PRN
  Administered 2020-09-11: 10 mL via INTRA_ARTERIAL

## 2020-09-11 MED ORDER — HEPARIN (PORCINE) IN NACL 1000-0.9 UT/500ML-% IV SOLN
INTRAVENOUS | Status: DC | PRN
  Administered 2020-09-11 (×2): 500 mL

## 2020-09-11 MED ORDER — SODIUM CHLORIDE 0.9% FLUSH: 3.0000 mL | Freq: Two times a day (BID) | INTRAVENOUS | Status: DC

## 2020-09-11 MED ORDER — MIDAZOLAM HCL 2 MG/2ML IJ SOLN
INTRAMUSCULAR | Status: DC | PRN
  Administered 2020-09-11: 1 mg via INTRAVENOUS
  Administered 2020-09-11: 2 mg via INTRAVENOUS

## 2020-09-11 MED ORDER — FENTANYL CITRATE (PF) 100 MCG/2ML IJ SOLN
INTRAMUSCULAR | Status: AC
  Filled 2020-09-11: qty 2

## 2020-09-11 MED ORDER — ACETAMINOPHEN 325 MG PO TABS: 650.0000 mg | ORAL_TABLET | ORAL | Status: DC | PRN

## 2020-09-11 MED ORDER — LIDOCAINE HCL (PF) 1 % IJ SOLN
INTRAMUSCULAR | Status: DC | PRN
  Administered 2020-09-11: 2 mL

## 2020-09-11 MED ORDER — LABETALOL HCL 5 MG/ML IV SOLN: 10.0000 mg | INTRAVENOUS | Status: DC | PRN

## 2020-09-11 MED ORDER — MIDAZOLAM HCL 2 MG/2ML IJ SOLN
INTRAMUSCULAR | Status: AC
  Filled 2020-09-11: qty 2

## 2020-09-11 MED ORDER — ASPIRIN 81 MG PO CHEW: 81.0000 mg | CHEWABLE_TABLET | ORAL | Status: DC

## 2020-09-11 MED ORDER — VERAPAMIL HCL 2.5 MG/ML IV SOLN
INTRAVENOUS | Status: AC
  Filled 2020-09-11: qty 2

## 2020-09-11 MED ORDER — IOHEXOL 350 MG/ML SOLN
INTRAVENOUS | Status: DC | PRN
  Administered 2020-09-11: 80 mL

## 2020-09-11 MED ORDER — HEPARIN (PORCINE) IN NACL 1000-0.9 UT/500ML-% IV SOLN
INTRAVENOUS | Status: AC
  Filled 2020-09-11: qty 1000

## 2020-09-11 MED ORDER — SODIUM CHLORIDE 0.9 % WEIGHT BASED INFUSION: 1.0000 mL/kg/h | INTRAVENOUS | Status: DC

## 2020-09-11 MED ORDER — HEPARIN SODIUM (PORCINE) 1000 UNIT/ML IJ SOLN
INTRAMUSCULAR | Status: DC | PRN
  Administered 2020-09-11: 3500 [IU] via INTRAVENOUS

## 2020-09-11 MED ORDER — HEPARIN SODIUM (PORCINE) 1000 UNIT/ML IJ SOLN
INTRAMUSCULAR | Status: AC
  Filled 2020-09-11: qty 1

## 2020-09-11 MED ORDER — ONDANSETRON HCL 4 MG/2ML IJ SOLN: 4.0000 mg | Freq: Four times a day (QID) | INTRAMUSCULAR | Status: DC | PRN

## 2020-09-11 MED ORDER — SODIUM CHLORIDE 0.9 % WEIGHT BASED INFUSION
3.0000 mL/kg/h | INTRAVENOUS | Status: AC
  Administered 2020-09-11: 3 mL/kg/h via INTRAVENOUS

## 2020-09-11 MED ORDER — SODIUM CHLORIDE 0.9 % IV SOLN: INTRAVENOUS | Status: DC

## 2020-09-11 SURGICAL SUPPLY — 10 items
CATH 5FR JL3.5 JR4 ANG PIG MP (CATHETERS) ×1 IMPLANT
DEVICE RAD COMP TR BAND LRG (VASCULAR PRODUCTS) ×1 IMPLANT
GLIDESHEATH SLEND SS 6F .021 (SHEATH) ×1 IMPLANT
GUIDEWIRE INQWIRE 1.5J.035X260 (WIRE) IMPLANT
INQWIRE 1.5J .035X260CM (WIRE) ×2
KIT HEART LEFT (KITS) ×2 IMPLANT
PACK CARDIAC CATHETERIZATION (CUSTOM PROCEDURE TRAY) ×2 IMPLANT
SYR MEDRAD MARK 7 150ML (SYRINGE) ×1 IMPLANT
TRANSDUCER W/STOPCOCK (MISCELLANEOUS) ×2 IMPLANT
TUBING CIL FLEX 10 FLL-RA (TUBING) ×2 IMPLANT

## 2020-09-11 NOTE — Telephone Encounter (Signed)
Patient saw Overton Mam NP on 08/31/2020

## 2020-09-11 NOTE — Discharge Instructions (Signed)
Keep site clean and dry until tomorrow (09/12/20) at 2pm. May shower at 2pm tomorrow. Do not have to re-cover wound.  No using right arm until tomorrow at 2pm, then 10lb weight limit for 5 days.

## 2020-09-11 NOTE — Interval H&P Note (Signed)
Cath Lab Visit (complete for each Cath Lab visit)  Clinical Evaluation Leading to the Procedure:   ACS: No.  Non-ACS:    Anginal Classification: CCS III  Anti-ischemic medical therapy: Minimal Therapy (1 class of medications)  Non-Invasive Test Results: No non-invasive testing performed  Prior CABG: No previous CABG      History and Physical Interval Note:  09/11/2020 8:59 AM  Joseph Booker  has presented today for surgery, with the diagnosis of angina.  The various methods of treatment have been discussed with the patient and family. After consideration of risks, benefits and other options for treatment, the patient has consented to  Procedure(s): LEFT HEART CATH AND CORONARY ANGIOGRAPHY (N/A) as a surgical intervention.  The patient's history has been reviewed, patient examined, no change in status, stable for surgery.  I have reviewed the patient's chart and labs.  Questions were answered to the patient's satisfaction.     Larae Grooms

## 2020-09-13 ENCOUNTER — Encounter (HOSPITAL_COMMUNITY): Payer: Self-pay | Admitting: Interventional Cardiology

## 2020-09-14 ENCOUNTER — Encounter (HOSPITAL_BASED_OUTPATIENT_CLINIC_OR_DEPARTMENT_OTHER): Payer: Self-pay

## 2020-09-14 NOTE — Telephone Encounter (Signed)
Called pt regarding mychart message and spoke with wife. Wife state she is not with pt and  will have him call first thing in the morning to provide more details. Nurse instructed wife to inform pt to report to ER if he is currently having active chest pain. Wife verbalized understanding.

## 2020-09-15 ENCOUNTER — Telehealth (HOSPITAL_BASED_OUTPATIENT_CLINIC_OR_DEPARTMENT_OTHER): Payer: Self-pay | Admitting: Cardiovascular Disease

## 2020-09-15 NOTE — Telephone Encounter (Signed)
Patient calling  per Nurse's request in [place] sent to the office.

## 2020-09-15 NOTE — Telephone Encounter (Signed)
Received call transferred directly from operator and spoke with patient.   Patient with cath on 8/26 showed the following-  Prox LAD lesion is 40% stenosed seen in cranial views only.  Eccentric lesion.   Prox RCA lesion is 25% stenosed.   Mid LAD-2 lesion is 30% stenosed.   Previously placed Mid LAD-1 stent (unknown type) is  widely patent.   LV end diastolic pressure is normal.   There is no aortic valve stenosis.   Dilated aortic root.   Patent stent.  Mild to moderate scattered nonobstructive CAD.   Dilated aortic root to be followed with serial  Imaging  Patient reports pain he is having now is not like pain prior to procedure.  Not exertional. Comes and goes and does not occur very often. More frequent right after cath and he thinks it may be getting better. Has not had to use NTG.  Did take acetaminophen after procedure. Feels like a soreness. Has follow up with Laurann Montana, NP on 9/7. He is asking if Dr Oval Linsey feels this could be related to trauma/bruising after cath.  He will follow up as planned unless Dr Oval Linsey has further recommendations

## 2020-09-15 NOTE — Progress Notes (Signed)
NEUROLOGY FOLLOW UP OFFICE NOTE  Joseph Booker [id number]  Assessment/Plan:   Left leg paresthesias. Unclear etiology but no neurologic cause found, based on imaging of the neuro-axis and NCV-EMG.  The only thing else I would consider would be CT abdomen and pelvis.  As symptoms are unchanged, we will defer.  If symptoms get worse or if he develops pain and weakness, I asked that he contact me and we can order that with office follow up.  Subjective:  Joseph Booker is a 70 year old right-handed male with CAD, glaucoma, and ulcerative colitis who follows up for left leg numbness.  MRI of neuro-axis personally reviewed.  UPDATE: MRI of cervical and thoracic spine on 03/30/2020, showed multilevel cervical spondylosis and mild noncompressive disc bulging at T11-12 but no spinal stenosis or evidence of myelopathy.  Questionable focal encephalomalacia at the inferior right cerebellum noted.  Follow up MRI of brain with and without contrast on 04/13/2020 showed arachnoid cyst with mild mass effect but no edema of the cerebellum vs mega cisterna magna but otherwise unremarkable and nothing to explain symptoms.  MRI of lumbar spine on 05/11/2020 showed mild degenerative changes but no evidence of neural impingement.  NCV-EMG on 07/22/2020 was normal.  Labs from 08/14/2020 showed B12 501, folate >24.4, sed rate 2, vit D 44.06.    Numbness is unchanged. No pain or weakness.   HISTORY: In December 2021, he started experiencing numbness and tingling of his entire left leg from the bottom of the foot up to the groin.  No preceding injury or strenuous activity.  No associated low back pain, bowel/bladder dysfunction, weakness, swelling, discoloration or leg pain.  He notices it mostly laying in bed.  He now states it is more prominent on the sole of his foot but maybe it is because he has gotten use to the numbness in the leg.  He has some chronic RIGHT-sided groin pain due to history of complicated right hernia repair  several years ago.  PAST MEDICAL HISTORY: Past Medical History:  Diagnosis Date   Allergic rhinitis    Allergy    Anxiety state, unspecified    Ascending aortic aneurysm ([place]) 05/15/2020   CAD (coronary artery disease)    [name] / nuclear 2005, excellent tolerance no scar or ischemia, EF 51% / nuclear, February, 2012, no scar or ischemia / catheterization March 26, 2010.. 10% in-stent restenosis, minimal other nonobstructive coronary disease, ejection fraction 55%., excellent result; ETT 9/13: ex 13:14, no CP, no ischemic ECG changes   Cancer (HCC)    basal cell carcinoma x2 on face   Cataract    surgery to both eyes   Elevated blood pressure reading 05/15/2020   Glaucoma, left eye    left eye   History of exercise intolerance 05/23/2013   cardiopulmonary exercise test-- normal functional capacity when compared to matched sedentary norms, borderline ventilatory limitation with the patient's exercise tidal volume reaching 90% of the measured inspiratory capacity ( pt reports no dyspnea), did not appear to be circulatory limitation, normal CPX test   Hyperlipidemia    Nocturia    Osteopenia    Thoracic ascending aortic aneurysm [facility])    followed by dr Skeet Latch---  last CT 12-29-2016 ,  4.5cm   Ulcerative colitis, universal ([place])    GI-- [name]--  dx 1990s  (per note mild Mayo Score 1)   Wears glasses     MEDICATIONS: Current Outpatient Medications on File Prior to Visit  Medication Sig Dispense  Refill   aspirin 81 MG tablet Take 81 mg by mouth daily.     calcium carbonate (TUMS EX) 750 MG chewable tablet Chew 1 tablet by mouth 2 (two) times daily.     clopidogrel (PLAVIX) 75 MG tablet TAKE 1 TABLET (75 MG TOTAL) BY MOUTH DAILY. 90 tablet 1   Coenzyme Q10 (CO Q-10) 300 MG CAPS Take 300 mg by mouth every morning.     latanoprost (XALATAN) 0.005 % ophthalmic solution Place 1 drop into the left eye daily.     mesalamine (APRISO) 0.375 g 24 hr capsule TAKE 4  CAPSULES BY MOUTH EVERY DAY 360 capsule 2   Probiotic Product (VSL#3) CAPS Take 1 capsule by mouth once a week.     REPATHA SURECLICK 062 MG/ML SOAJ INJECT 140 MG INTO THE SKIN EVERY 14 (FOURTEEN) DAYS. 2 mL 11   timolol (TIMOPTIC) 0.5 % ophthalmic solution Place 1 drop into both eyes 2 (two) times daily.      No current facility-administered medications on file prior to visit.    ALLERGIES: Allergies  Allergen Reactions   Atorvastatin Other (See Comments)    transaminits Transaminits, causes liver enzymes to go up    FAMILY HISTORY: Family History  Problem Relation Age of Onset   Diabetes Mother    Heart disease Mother    Lung disease Mother        ILD   Rheum arthritis Mother    CAD Mother    Heart disease Father 61       MI   Heart attack Father    Asthma Sister    Colon cancer Neg Hx    Esophageal cancer Neg Hx    Pancreatic cancer Neg Hx    Kidney disease Neg Hx    Liver disease Neg Hx    Rectal cancer Neg Hx    Stomach cancer Neg Hx       Objective:  Blood pressure 130/78, pulse (!) 55, height 5' 8"  (1.727 m), weight 148 lb 3.2 oz (67.2 kg), SpO2 97 %. General: No acute distress.  Patient appears well-groomed.    Metta Clines, DO  CC: Billey Gosling, MD

## 2020-09-15 NOTE — Telephone Encounter (Signed)
See my chart message from yesterday for documentation.

## 2020-09-16 ENCOUNTER — Encounter: Payer: Self-pay | Admitting: Neurology

## 2020-09-16 ENCOUNTER — Other Ambulatory Visit: Payer: Self-pay

## 2020-09-16 ENCOUNTER — Ambulatory Visit (INDEPENDENT_AMBULATORY_CARE_PROVIDER_SITE_OTHER): Payer: Medicare Other | Admitting: Neurology

## 2020-09-16 VITALS — BP 130/78 | HR 55 | Ht 68.0 in | Wt 148.2 lb

## 2020-09-16 DIAGNOSIS — I2510 Atherosclerotic heart disease of native coronary artery without angina pectoris: Secondary | ICD-10-CM | POA: Diagnosis not present

## 2020-09-16 DIAGNOSIS — R200 Anesthesia of skin: Secondary | ICD-10-CM

## 2020-09-16 NOTE — Patient Instructions (Signed)
Workup for left leg numbness is negative.  There is nothing in the brain, spinal cord, lower back and nerves in the leg to explain symptoms.  If symptoms get worse or if you start developing new symptoms (weakness, pain), contact me and we can consider CT abdomen and pelvis.

## 2020-09-22 NOTE — Progress Notes (Signed)
Office Visit    Patient Name: Joseph Booker Date of Encounter: 09/23/2020  PCP:  Binnie Rail, MD   [place]  Cardiologist:  Skeet Latch, MD  Advanced Practice Provider:  No care team member to display Electrophysiologist:  None   Chief Complaint    Joseph Booker is a 70 y.o. male with a hx of CAD s/p PCI of LAD in 2004, HLD, mild ascending aorta aneurysm presents today for follow up after cardiac cath.   Past Medical History    Past Medical History:  Diagnosis Date   Allergic rhinitis    Allergy    Anxiety state, unspecified    Ascending aortic aneurysm ([place]) 05/15/2020   CAD (coronary artery disease)    [name] / nuclear 2005, excellent tolerance no scar or ischemia, EF 51% / nuclear, February, 2012, no scar or ischemia / catheterization March 26, 2010.. 10% in-stent restenosis, minimal other nonobstructive coronary disease, ejection fraction 55%., excellent result; ETT 9/13: ex 13:14, no CP, no ischemic ECG changes   Cancer (HCC)    basal cell carcinoma x2 on face   Cataract    surgery to both eyes   Elevated blood pressure reading 05/15/2020   Glaucoma, left eye    left eye   History of exercise intolerance 05/23/2013   cardiopulmonary exercise test-- normal functional capacity when compared to matched sedentary norms, borderline ventilatory limitation with the patient's exercise tidal volume reaching 90% of the measured inspiratory capacity ( pt reports no dyspnea), did not appear to be circulatory limitation, normal CPX test   Hyperlipidemia    Nocturia    Osteopenia    Thoracic ascending aortic aneurysm [facility])    followed by dr Skeet Latch---  last CT 12-29-2016 ,  4.5cm   Ulcerative colitis, universal ([place])    GI-- [name]--  dx 1990s  (per note mild Mayo Score 1)   Wears glasses    Past Surgical History:  Procedure Laterality Date   CARDIAC CATHETERIZATION  03/26/2010    dr Dian Queen   single vessel CAD w/ patent  mLAD stent w/ 10% in-stent restenosis w/ mild to moderate disease in the remainder LAD w/ no lesions that appearedto be flow-limitinf/  normal LVFSF, ef 55%   CARDIOVASCULAR STRESS TEST  06-30-2016   [name]   Low risk nuclear study w/ no evidence ishcemia/  normal LV function and wall motion,  nuclear stress ef 55%   COLONOSCOPY  last one 10-27-2016   CORONARY ANGIOPLASTY WITH STENT PLACEMENT  07-25-2002   [name]   PTCA w/  DES x1 to [place] Left 2006   INGUINAL HERNIA REPAIR Right 07/03/2017   Procedure: OPEN REPAIR OF RIGHT INGUINAL HERNIA WITH MESH;  Surgeon: Kinsinger, Arta Bruce, MD;  Location: [facility];  Service: General;  Laterality: Right;   INSERTION OF MESH Right 07/03/2017   Procedure: INSERTION OF MESH;  Surgeon: Kieth Brightly Arta Bruce, MD;  Location: [facility];  Service: General;  Laterality: Right;   LEFT HEART CATH AND CORONARY ANGIOGRAPHY N/A 09/11/2020   Procedure: LEFT HEART CATH AND CORONARY ANGIOGRAPHY;  Surgeon: Jettie Booze, MD;  Location: [place] [facility];  Service: Cardiovascular;  Laterality: N/A;   SEPTOPLASTY  1987   w/ RHINOPLASTY   THORACIC AORTOGRAM N/A 09/11/2020   Procedure: THORACIC AORTOGRAM;  Surgeon: Jettie Booze, MD;  Location: [place] [facility];  Service: Cardiovascular;  Laterality: N/A;  TONSILLECTOMY  1959   TRANSTHORACIC ECHOCARDIOGRAM  06-30-2016     [name]   ef 55-60%/  trivial AR/ mild dilated ascending aorta, 54m/  trivial MR/  mild PR and TR     Allergies  Allergies  Allergen Reactions   Atorvastatin Other (See Comments)    transaminits Transaminits, causes liver enzymes to go up    History of Present Illness    Joseph Ohairis a 70y.o. male with a hx of  CAD s/p PCI of LAD in 2004, HLD, mild ascending aorta aneurysm last seen for cardiac cath.  CPX in 2015 which was normal. Longstanding history of non exertional shortness of breath for which he has seen  an allergist who suggested he might have inflammation in his lungs. Lexiscan myoview 06/2016 with LVEF 55% and no ischemia. Previous transaminitis on Atorvastatin and previously transitioned to [place]   CT 04/29/20 with stable ascending aortic aneurysm 4.5 cm.  He was seen 06/12/20 noting feeling significantly short of breath while carrying his dog. He was in the process of weaning of budesonide.  He had stress test 06/19/20 with exceptional functional capacity (13:01 mins, 13.6 METs), frequent PVCs, no evidence of ischemia or infarction. Given EF was 48% by stress test, echocardiogram was ordered. Echo 07/17/20 with LVEF by 3D volume 67%, no RWMA, normal LVEDP, RV normal size and function, normal PASP, trivial MR, mild dilation of ascending aorta 425mand aortic root 4065m  Seen in follow up 08/31/20 noting recurrent chest pain. He had frequent PVC by EKG but no St/T wave changes. Cardiac cath pursued 09/11/20 with prox LAD 40% eccentric lesion, prox RCA 25%, mid LAD-2 lesion 30%, previous mid LAD stent widely patent, normal LVEDP, moderately dilated aortic root.   Presents today for follow up. 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 Reports some mild chest soreness for a few days after cardiac cath which has resolved. No recurrent chest pain ,pressure, tightness. Back to walking 3-5 miles without difficulty. R radial cath site with only mild bruising.   EKGs/Labs/Other Studies Reviewed:   The following studies were reviewed today:  Cardiac cath 09/11/20   Prox LAD lesion is 40% stenosed seen in cranial views only.  Eccentric lesion.   Prox RCA lesion is 25% stenosed.   Mid LAD-2 lesion is 30% stenosed.   Previously placed Mid LAD-1 stent (unknown type) is  widely patent.   LV end diastolic pressure is normal.   There is no aortic valve stenosis.   Dilated aortic root.   Patent stent.  Mild to moderate scattered nonobstructive CAD.   Dilated aortic root to be followed with  serial imaging.    Echocardiogram 07/17/20  1. Left ventricular ejection fraction, by estimation, is 60 to 65%. Left  ventricular ejection fraction by 3D volume is 67 %. The left ventricle has  normal function. The left ventricle has no regional wall motion  abnormalities. Left ventricular diastolic   parameters were normal.   2. Right ventricular systolic function is normal. The right ventricular  size is normal. There is normal pulmonary artery systolic pressure. The  estimated right ventricular systolic pressure is 19.50.0Hg.   3. The mitral valve is normal in structure. Trivial mitral valve  regurgitation. No evidence of mitral stenosis.   4. The aortic valve is tricuspid. Aortic valve regurgitation is mild. No  aortic stenosis is present.   5. Aortic dilatation noted. There is mild dilatation of the ascending  aorta, measuring  43 mm. There is mild dilatation of the aortic root,  measuring 40 mm.   6. The inferior vena cava is normal in size with greater than 50%  respiratory variability, suggesting right atrial pressure of 3 mmHg.   Comparison(s): No significant change from prior study. Prior images  reviewed side by side.   Gated Myoview 06/19/20 The left ventricular ejection fraction is mildly decreased (45-54%). Nuclear stress EF: 48%. The study is normal. This is a low risk study.   1. Normal perfusion without evidence of ischemia or infarction. 2. Mildly reduced LVEF, 48%. Visually this appears normal. Would recommend an echocardiogram for better evaluation. 3. Exceptional function capacity (13:01 min:s; 13.6 METS).  4. Frequent PVCs during exercise (with couplets) and lasting into recovery.  5. Normal HR/BP response to exercise.   CT ANGIOGRAPHY CHEST WITH CONTRAST 04/29/2020: 1. Stable aneurysmal disease of the ascending thoracic aorta measuring approximately 4.5 cm in greatest diameter. Ascending thoracic aortic aneurysm. Recommend semi-annual imaging followup by CTA  or MRA and referral to cardiothoracic surgery if not already obtained. This recommendation follows 2010 ACCF/AHA/AATS/ACR/ASA/SCA/SCAI/SIR/STS/SVM Guidelines for the Diagnosis and Management of Patients With Thoracic Aortic Disease. Circulation. 2010; 121: E831-D176. Aortic aneurysm NOS (ICD10-I71.9) 2. Stable evidence of coronary atherosclerosis.   Echo 05/01/2019:  1. Left ventricular ejection fraction, by estimation, is 60 to 65%. The  left ventricle has normal function. The left ventricle has no regional  wall motion abnormalities. Left ventricular diastolic parameters were  normal.   2. Right ventricular systolic function is normal. The right ventricular  size is normal. There is normal pulmonary artery systolic pressure.   3. The mitral valve is normal in structure. Trivial mitral valve  regurgitation. No evidence of mitral stenosis.   4. The aortic valve is tricuspid. Aortic valve regurgitation is trivial.  No aortic stenosis is present.   5. 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 Aortic dilatation noted. There is  moderate dilatation of the aortic root.   6. The inferior vena cava is normal in size with greater than 50%  respiratory variability, suggesting right atrial pressure of 3 mmHg.    Echo 02/14/18:    1. Sinus bradycardia in the upper 40s throughout study.  2. Moderate dilatation of the ascending aorta.  3. Aneurysm of the ascending aorta, measured at 4.5 cm. Recommend dedidated imaging ot aorta with CT or MRI if clinically indicated and not recently performed.  4. The left ventricle appears to be normal in size, have normal wall thickness, with EF of 60-65%. Echo evidence of normal diastolic filling patterns.  5. GLS -19.9%.  6. Right ventricular systolic pressure is is normal.  7. The right ventricle is normal in size, has normal wall thickness and normal systolic function.  8. Normal left atrial  size.  9. Normal right atrial size. 10. Mild mitral annular calcification. 11. Mitral valve regurgitation is mild by color flow Doppler. 12. The mitral valve normal in structure and function. 13. Normal tricuspid valve. 14. Aortic valve normal. 15. Aortic valve regurgitation is mild by color flow Doppler. 16. There is mild calcification of the aortic valve. 17. There is mild thickening of the aortic valve. 18. No atrial level shunt detected by color flow Doppler. 19. Tricuspid regurgitation is mild.   Coronary CT-A 12/29/16:  Ascending aneurysm 4.5 cm. <50% LM stenosis.  Patent proximal LAD stent.    Lexiscan Myoview 06/2016: Nuclear stress EF: 55%. Blood  pressure demonstrated a hypertensive response to exercise. There was no ST segment deviation noted during stress. The study is normal. This is a low risk study. The left ventricular ejection fraction is normal (55-65%).   Normal resting and stress perfusion. No ischemia or infarction EF 55%   Echo 06/30/16: Nuclear stress EF: 55%. Blood pressure demonstrated a hypertensive response to exercise. There was no ST segment deviation noted during stress. The study is normal. This is a low risk study. The left ventricular ejection fraction is normal (55-65%).   Normal resting and stress perfusion. No ischemia or infarction EF 55%   CPX 05/23/13: Normal  EKG:  No EKG today.  Recent Labs: 08/14/2020: ALT 17 09/04/2020: BUN 19; Creatinine, Ser 1.00; Hemoglobin 16.2; Platelets 171; Potassium 4.4; Sodium 143  Recent Lipid Panel    Component Value Date/Time   CHOL 135 12/30/2019 0908   TRIG 94 12/30/2019 0908   TRIG 46 12/19/2008 0000   HDL 56 12/30/2019 0908   CHOLHDL 2.4 12/30/2019 0908   CHOLHDL 4 03/25/2016 0914   VLDL 18.2 03/25/2016 0914   LDLCALC 61 12/30/2019 0908   Home Medications   Current Meds  Medication Sig   aspirin 81 MG tablet Take 81 mg by mouth daily.   clopidogrel (PLAVIX) 75 MG tablet TAKE 1 TABLET  (75 MG TOTAL) BY MOUTH DAILY.   Coenzyme Q10 (CO Q-10) 300 MG CAPS Take 300 mg by mouth every morning.   latanoprost (XALATAN) 0.005 % ophthalmic solution Place 1 drop into the left eye daily.   mesalamine (APRISO) 0.375 g 24 hr capsule TAKE 4 CAPSULES BY MOUTH EVERY DAY   Probiotic Product (VSL#3) CAPS Take 1 capsule by mouth once a week.   REPATHA SURECLICK 254 MG/ML SOAJ INJECT 140 MG INTO THE SKIN EVERY 14 (FOURTEEN) DAYS.   timolol (TIMOPTIC) 0.5 % ophthalmic solution Place 1 drop into both eyes 2 (two) times daily.      Review of Systems     All other systems reviewed and are otherwise negative except as noted above.  Physical Exam    VS:  BP 130/80 (BP Location: Left Arm, Patient Position: Sitting, Cuff Size: Normal)   Pulse (!) 44   Ht 5' 8"  (1.727 m)   Wt 148 lb (67.1 kg)   SpO2 98%   BMI 22.50 kg/m  , BMI Body mass index is 22.5 kg/m.  Wt Readings from Last 3 Encounters:  09/23/20 148 lb (67.1 kg)  09/16/20 148 lb 3.2 oz (67.2 kg)  09/11/20 143 lb (64.9 kg)    GEN: Well nourished, well developed, in no acute distress. HEENT: normal. Neck: Supple, no JVD, carotid bruits, or masses. Cardiac: RRR, no murmurs, rubs, or gallops. No clubbing, cyanosis, edema.  Radials/PT 2+ and equal bilaterally.  Respiratory:  Respirations regular and unlabored, clear to auscultation bilaterally. GI: Soft, nontender, nondistended. MS: No deformity or atrophy. Skin: Warm and dry, no rash. R radial cath site with yellowed ecchymosis but no hematoma.  Neuro:  Strength and sensation are intact. Psych: Normal affect.  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 Unchanged from previous study 2019. No beta blocker due to bradycardia. Continue optimal blood pressure control. Consider repeat study at follow up.  SOB / CAD / Chest pain - Previous stent to LAD in 2004.  Stress test 06/19/20 low risk study. Echo 07/17/20 normal LVEF, no RWMA. Cath 08/2020 with patent  stent and otherwise nonobstructive disease.  No anginal symptoms nor dypnea. Heart healthy diet and regular cardiovascular exercise encouraged.  GDMT includes repatha, plavix, aspirin. No BB due to bradycardia.   PVC - Noted by previous EKG. No palpitations. No AV nodal blocking therapy due to baseline bradycardia. If becomes symptomatic, consider ZIO.  Disposition: Follow up 04/2021 with Dr. Oval Linsey or APP.  Signed, Loel Dubonnet, NP 09/23/2020, 10:39 AM [place]

## 2020-09-23 ENCOUNTER — Encounter (HOSPITAL_BASED_OUTPATIENT_CLINIC_OR_DEPARTMENT_OTHER): Payer: Self-pay | Admitting: Family

## 2020-09-23 ENCOUNTER — Other Ambulatory Visit: Payer: Self-pay

## 2020-09-23 ENCOUNTER — Ambulatory Visit (INDEPENDENT_AMBULATORY_CARE_PROVIDER_SITE_OTHER): Payer: Medicare Other | Admitting: Family

## 2020-09-23 VITALS — BP 130/80 | HR 44 | Ht 68.0 in | Wt 148.0 lb

## 2020-09-23 DIAGNOSIS — I77810 Thoracic aortic ectasia: Secondary | ICD-10-CM

## 2020-09-23 DIAGNOSIS — E785 Hyperlipidemia, unspecified: Secondary | ICD-10-CM | POA: Diagnosis not present

## 2020-09-23 DIAGNOSIS — R790 Abnormal level of blood mineral: Secondary | ICD-10-CM

## 2020-09-23 DIAGNOSIS — I25118 Atherosclerotic heart disease of native coronary artery with other forms of angina pectoris: Secondary | ICD-10-CM | POA: Diagnosis not present

## 2020-09-23 NOTE — Patient Instructions (Signed)
Medication Instructions:  Continue your current medications.   *If you need a refill on your cardiac medications before your next appointment, please call your pharmacy*   Lab Work: Your physician recommends that you return for lab work in December for  fasting lipid panel, CBC, CMP, and anemia panel. Simply stop by the third floor of [facility] for fasting lab work. No need for an appointment.    If you have labs (blood work) drawn today and your tests are completely normal, you will receive your results only by: [place] (if you have MyChart) OR A paper copy in the mail If you have any lab test that is abnormal or we need to change your treatment, we will call you to review the results.   Testing/Procedures: None ordered today. Cardiac catheterization looked great!   Follow-Up: At [facility], you and your health needs are our priority.  As part of our continuing mission to provide you with exceptional heart care, we have created designated Provider Care Teams.  These Care Teams include your primary Cardiologist (physician) and Advanced Practice Providers (APPs -  Physician Assistants and Nurse Practitioners) who all work together to provide you with the care you need, when you need it.  We recommend signing up for the patient portal called "MyChart".  Sign up information is provided on this After Visit Summary.  MyChart is used to connect with patients for Virtual Visits (Telemedicine).  Patients are able to view lab/test results, encounter notes, upcoming appointments, etc.  Non-urgent messages can be sent to your provider as well.   To learn more about what you can do with MyChart, go to [email or web address].    Your next appointment:   April 2023 with Dr. Oval Linsey   Other Instructions  Heart Healthy Diet Recommendations: A low-salt diet is recommended. Meats should be grilled, baked, or boiled. Avoid fried foods. Focus on lean protein sources like fish or  chicken with vegetables and fruits. The American Heart Association is a Microbiologist!    Exercise recommendations: The American Heart Association recommends 150 minutes of moderate intensity exercise weekly. Try 30 minutes of moderate intensity exercise 4-5 times per week. This could include walking, jogging, or swimming.

## 2020-10-08 ENCOUNTER — Ambulatory Visit: Payer: Medicare Other | Attending: Internal Medicine

## 2020-10-08 ENCOUNTER — Other Ambulatory Visit (HOSPITAL_BASED_OUTPATIENT_CLINIC_OR_DEPARTMENT_OTHER): Payer: Self-pay

## 2020-10-08 DIAGNOSIS — Z23 Encounter for immunization: Secondary | ICD-10-CM

## 2020-10-08 MED ORDER — PFIZER COVID-19 VAC BIVALENT 30 MCG/0.3ML IM SUSP
INTRAMUSCULAR | 0 refills | Status: DC
  Filled 2020-10-08: qty 0.3, 1d supply, fill #0

## 2020-10-08 NOTE — Progress Notes (Signed)
   Covid-19 Vaccination Clinic  Name:  Joseph Booker    MRN: [id number] DOB: 1950-07-08  10/08/2020  [name] was observed post Covid-19 immunization for 15 minutes without incident. He was provided with Vaccine Information Sheet and instruction to access the V-Safe system.   [name] was instructed to call 911 with any severe reactions post vaccine: Difficulty breathing  Swelling of face and throat  A fast heartbeat  A bad rash all over body  Dizziness and weakness

## 2020-10-08 NOTE — Telephone Encounter (Signed)
Patient was seen by Overton Mam NP 9/7

## 2020-10-09 ENCOUNTER — Ambulatory Visit: Payer: Medicare Other

## 2020-10-23 DIAGNOSIS — R3912 Poor urinary stream: Secondary | ICD-10-CM | POA: Diagnosis not present

## 2020-10-23 DIAGNOSIS — N210 Calculus in bladder: Secondary | ICD-10-CM | POA: Diagnosis not present

## 2020-10-23 DIAGNOSIS — N401 Enlarged prostate with lower urinary tract symptoms: Secondary | ICD-10-CM | POA: Diagnosis not present

## 2020-10-23 DIAGNOSIS — R3121 Asymptomatic microscopic hematuria: Secondary | ICD-10-CM | POA: Diagnosis not present

## 2020-10-30 ENCOUNTER — Other Ambulatory Visit (HOSPITAL_BASED_OUTPATIENT_CLINIC_OR_DEPARTMENT_OTHER): Payer: Self-pay

## 2020-10-30 ENCOUNTER — Other Ambulatory Visit: Payer: Self-pay | Admitting: Cardiovascular Disease

## 2020-10-30 DIAGNOSIS — Z23 Encounter for immunization: Secondary | ICD-10-CM | POA: Diagnosis not present

## 2020-10-30 MED ORDER — FLUARIX QUADRIVALENT 0.5 ML IM SUSY
PREFILLED_SYRINGE | INTRAMUSCULAR | 0 refills | Status: DC
  Filled 2020-10-30: qty 0.5, 1d supply, fill #0

## 2020-11-12 ENCOUNTER — Other Ambulatory Visit: Payer: Self-pay | Admitting: Gastroenterology

## 2020-12-24 DIAGNOSIS — E785 Hyperlipidemia, unspecified: Secondary | ICD-10-CM | POA: Diagnosis not present

## 2020-12-24 DIAGNOSIS — R790 Abnormal level of blood mineral: Secondary | ICD-10-CM | POA: Diagnosis not present

## 2020-12-24 DIAGNOSIS — I77810 Thoracic aortic ectasia: Secondary | ICD-10-CM | POA: Diagnosis not present

## 2020-12-24 DIAGNOSIS — I25118 Atherosclerotic heart disease of native coronary artery with other forms of angina pectoris: Secondary | ICD-10-CM | POA: Diagnosis not present

## 2020-12-25 LAB — CBC
Hematocrit: 45.9 % (ref 37.5–51.0)
Hemoglobin: 15.9 g/dL (ref 13.0–17.7)
MCH: 33.3 pg — ABNORMAL HIGH (ref 26.6–33.0)
MCHC: 34.6 g/dL (ref 31.5–35.7)
MCV: 96 fL (ref 79–97)
Platelets: 165 10*3/uL (ref 150–450)
RBC: 4.78 x10E6/uL (ref 4.14–5.80)
RDW: 12.2 % (ref 11.6–15.4)
WBC: 6.1 10*3/uL (ref 3.4–10.8)

## 2020-12-25 LAB — LIPID PANEL
Chol/HDL Ratio: 2.4 ratio (ref 0.0–5.0)
Cholesterol, Total: 127 mg/dL (ref 100–199)
HDL: 54 mg/dL (ref >39)
LDL Chol Calc (NIH): 56 mg/dL (ref 0–99)
Triglycerides: 88 mg/dL (ref 0–149)
VLDL Cholesterol Cal: 17 mg/dL (ref 5–40)

## 2020-12-25 LAB — COMPREHENSIVE METABOLIC PANEL
ALT: 17 IU/L (ref 0–44)
AST: 26 IU/L (ref 0–40)
Albumin/Globulin Ratio: 1.7 (ref 1.2–2.2)
Albumin: 4.3 g/dL (ref 3.8–4.8)
Alkaline Phosphatase: 52 IU/L (ref 44–121)
BUN/Creatinine Ratio: 20 (ref 10–24)
BUN: 23 mg/dL (ref 8–27)
Bilirubin Total: 0.7 mg/dL (ref 0.0–1.2)
CO2: 24 mmol/L (ref 20–29)
Calcium: 9 mg/dL (ref 8.6–10.2)
Chloride: 105 mmol/L (ref 96–106)
Creatinine, Ser: 1.13 mg/dL (ref 0.76–1.27)
Globulin, Total: 2.6 g/dL (ref 1.5–4.5)
Glucose: 94 mg/dL (ref 70–99)
Potassium: 4.1 mmol/L (ref 3.5–5.2)
Sodium: 141 mmol/L (ref 134–144)
Total Protein: 6.9 g/dL (ref 6.0–8.5)
eGFR: 71 mL/min/{1.73_m2} (ref >59)

## 2020-12-25 LAB — IRON AND TIBC
Iron Saturation: 44 % (ref 15–55)
Iron: 130 ug/dL (ref 38–169)
Total Iron Binding Capacity: 295 ug/dL (ref 250–450)
UIBC: 165 ug/dL (ref 111–343)

## 2020-12-25 LAB — FERRITIN: Ferritin: 92 ng/mL (ref 30–400)

## 2020-12-25 NOTE — Progress Notes (Signed)
Seen by patient Joseph Booker on 12/25/2020  8:08 AM

## 2021-01-11 ENCOUNTER — Encounter (HOSPITAL_COMMUNITY): Payer: Self-pay

## 2021-01-11 ENCOUNTER — Other Ambulatory Visit: Payer: Self-pay

## 2021-01-11 ENCOUNTER — Ambulatory Visit (HOSPITAL_COMMUNITY)
Admission: EM | Admit: 2021-01-11 | Discharge: 2021-01-11 | Disposition: A | Discharge status: Home / Self Care | Payer: Medicare Other | Attending: Physician Assistant | Admitting: Physician Assistant

## 2021-01-11 DIAGNOSIS — I2510 Atherosclerotic heart disease of native coronary artery without angina pectoris: Secondary | ICD-10-CM | POA: Diagnosis not present

## 2021-01-11 DIAGNOSIS — J069 Acute upper respiratory infection, unspecified: Secondary | ICD-10-CM | POA: Diagnosis not present

## 2021-01-11 DIAGNOSIS — Z20822 Contact with and (suspected) exposure to covid-19: Secondary | ICD-10-CM | POA: Diagnosis not present

## 2021-01-11 DIAGNOSIS — Z8701 Personal history of pneumonia (recurrent): Secondary | ICD-10-CM | POA: Insufficient documentation

## 2021-01-11 LAB — POC INFLUENZA A AND B ANTIGEN (URGENT CARE ONLY)
INFLUENZA A ANTIGEN, POC: NEGATIVE
INFLUENZA B ANTIGEN, POC: NEGATIVE

## 2021-01-11 LAB — POCT RAPID STREP A, ED / UC: Streptococcus, Group A Screen (Direct): NEGATIVE

## 2021-01-11 MED ORDER — BENZONATATE 200 MG PO CAPS: 200.0000 mg | ORAL_CAPSULE | Freq: Three times a day (TID) | ORAL | 0 refills | Status: DC | PRN

## 2021-01-11 NOTE — Discharge Instructions (Signed)
Your strep and flu were negative.  We will contact you if your COVID test is positive.  Use Tessalon 3 times a day for cough.  He can use Mucinex, Flonase, Tylenol for additional symptom relief.  If your symptoms or not improving after a week or if at any point you have a worsening of symptoms need to be seen immediately.  If you have severe symptoms including high fever not responding to medication, nausea/vomiting interfering with oral intake, shortness of breath, chest pain, worsening cough you need to go to the emergency room.  If symptoms have not worsened but are not improving follow-up with either our clinic or your primary care provider next week.

## 2021-01-11 NOTE — ED Provider Notes (Signed)
[facility]    CSN: [id number] Arrival date & time: 01/11/21  2595      History   Chief Complaint Chief Complaint  Patient presents with   Cough    HPI Joseph Booker is a 70 y.o. male.   Patient presents today with a 3-day history of URI symptoms.  Reports cough, sore throat, fever, nasal congestion, body aches, headache.  Denies chest pain, shortness of breath, nausea, vomiting, diarrhea.  He has been trying over-the-counter medication which has been ineffective.  He denies any known sick contacts but did recently attend a funeral and could have been exposed at that time.  He has had COVID-19 vaccinations including booster.  He had his flu shot this year.  He denies history of asthma, allergies, COPD, smoking.  He does have a history of cardiovascular disease.  Reports he has had pneumonia multiple times in the past with similar symptoms.  He has taken at home COVID test that have been negative.   Past Medical History:  Diagnosis Date   Allergic rhinitis    Allergy    Anxiety state, unspecified    Ascending aortic aneurysm 05/15/2020   CAD (coronary artery disease)    [name] / nuclear 2005, excellent tolerance no scar or ischemia, EF 51% / nuclear, February, 2012, no scar or ischemia / catheterization March 26, 2010.. 10% in-stent restenosis, minimal other nonobstructive coronary disease, ejection fraction 55%., excellent result; ETT 9/13: ex 13:14, no CP, no ischemic ECG changes   Cancer (HCC)    basal cell carcinoma x2 on face   Cataract    surgery to both eyes   Elevated blood pressure reading 05/15/2020   Glaucoma, left eye    left eye   History of exercise intolerance 05/23/2013   cardiopulmonary exercise test-- normal functional capacity when compared to matched sedentary norms, borderline ventilatory limitation with the patient's exercise tidal volume reaching 90% of the measured inspiratory capacity ( pt reports no dyspnea), did not appear to be  circulatory limitation, normal CPX test   Hyperlipidemia    Nocturia    Osteopenia    Thoracic ascending aortic aneurysm    followed by dr Skeet Latch---  last CT 12-29-2016 ,  4.5cm   Ulcerative colitis, universal ([place])    GI-- [name]--  dx 1990s  (per note mild Mayo Score 1)   Wears glasses     Patient Active Problem List   Diagnosis Date Noted   Ascending aortic aneurysm 05/15/2020   Elevated blood pressure reading 05/15/2020   Right inguinal hernia 05/31/2017   Cough 04/14/2017   Hyperglycemia 02/01/2017   Change in stool 09/27/2016   Herpes zoster without complication 63/87/5643   Glaucoma 08/21/2013   Mitral regurgitation 05/23/2013   Shortness of breath 05/13/2013   Dilated aortic 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[place]) 05/13/2013   Pectus excavatum 05/13/2013   Left leg numbness 05/13/2013   BPH (benign prostatic hypertrophy) 07/06/2011   CAD (coronary artery disease)    Hyperlipidemia 06/12/2009   Vitamin D deficiency 06/04/2009   ALLERGIC RHINITIS 06/04/2009   GERD 06/04/2009   ANXIETY STATE, UNSPECIFIED 05/15/2009   Osteopenia 05/15/2009   Ulcerative colitis ([place]) 05/17/2007    Past Surgical History:  Procedure Laterality Date   CARDIAC CATHETERIZATION  03/26/2010    dr Dian Queen   single vessel CAD w/ patent mLAD stent w/ 10% in-stent restenosis w/ mild to moderate disease in the remainder LAD w/ no lesions that appearedto be flow-limitinf/  normal LVFSF, ef 55%  CARDIOVASCULAR STRESS TEST  06-30-2016   [name]   Low risk nuclear study w/ no evidence ishcemia/  normal LV function and wall motion,  nuclear stress ef 55%   COLONOSCOPY  last one 10-27-2016   CORONARY ANGIOPLASTY WITH STENT PLACEMENT  07-25-2002   [name]   PTCA w/  DES x1 to [place] Left 2006   INGUINAL HERNIA REPAIR Right 07/03/2017   Procedure: OPEN REPAIR OF RIGHT INGUINAL HERNIA WITH MESH;  Surgeon: Kinsinger, Arta Bruce, MD;  Location: [place];  Service: General;   Laterality: Right;   INSERTION OF MESH Right 07/03/2017   Procedure: INSERTION OF MESH;  Surgeon: Kieth Brightly Arta Bruce, MD;  Location: [place];  Service: General;  Laterality: Right;   LEFT HEART CATH AND CORONARY ANGIOGRAPHY N/A 09/11/2020   Procedure: LEFT HEART CATH AND CORONARY ANGIOGRAPHY;  Surgeon: Jettie Booze, MD;  Location: [place] [facility];  Service: Cardiovascular;  Laterality: N/A;   SEPTOPLASTY  1987   w/ RHINOPLASTY   THORACIC AORTOGRAM N/A 09/11/2020   Procedure: THORACIC AORTOGRAM;  Surgeon: Jettie Booze, MD;  Location: [place] [facility];  Service: Cardiovascular;  Laterality: N/A;   TONSILLECTOMY  1959   TRANSTHORACIC ECHOCARDIOGRAM  06-30-2016     dr Alison Murray 55-60%/  trivial AR/ mild dilated ascending aorta, 45mm/  trivial MR/  mild PR and TR        Home Medications    Prior to Admission medications   Medication Sig Start Date End Date Taking? Authorizing Provider  benzonatate (TESSALON) 200 MG capsule Take 1 capsule (200 mg total) by mouth 3 (three) times daily as needed for cough. 01/11/21  Yes Nikaela Coyne, Derry Skill, PA-C  aspirin 81 MG tablet Take 81 mg by mouth daily.    [provider]  clopidogrel (PLAVIX) 75 MG tablet TAKE 1 TABLET BY MOUTH EVERY DAY 10/30/20   Skeet Latch, MD  Coenzyme Q10 (CO Q-10) 300 MG CAPS Take 300 mg by mouth every morning.    [provider]  COVID-19 mRNA bivalent vaccine, Pfizer, (PFIZER COVID-19 VAC BIVALENT) injection Inject into the muscle. 10/08/20   Carlyle Basques, MD  influenza vac split quadrivalent PF (FLUARIX QUADRIVALENT) 0.5 ML injection Inject into the muscle. 10/30/20     latanoprost (XALATAN) 0.005 % ophthalmic solution Place 1 drop into the left eye daily. 08/06/20   [provider]  mesalamine (APRISO) 0.375 g 24 hr capsule TAKE 4 CAPSULES BY MOUTH EVERY DAY 11/12/20   Mauri Pole, MD  Probiotic Product (VSL#3) CAPS Take 1 capsule by mouth once a  week.    [provider]  REPATHA SURECLICK 423 MG/ML SOAJ INJECT 140 MG INTO THE SKIN EVERY 14 (FOURTEEN) DAYS. 04/13/20   Skeet Latch, MD  timolol (TIMOPTIC) 0.5 % ophthalmic solution Place 1 drop into both eyes 2 (two) times daily.     [provider]    Family History Family History  Problem Relation Age of Onset   Diabetes Mother    Heart disease Mother    Lung disease Mother        ILD   Rheum arthritis Mother    CAD Mother    Heart disease Father 41       MI   Heart attack Father    Asthma Sister    Colon cancer Neg Hx    Esophageal cancer Neg Hx    Pancreatic cancer Neg  Hx    Kidney disease Neg Hx    Liver disease Neg Hx    Rectal cancer Neg Hx    Stomach cancer Neg Hx     Social History Social History   Tobacco Use   Smoking status: Never   Smokeless tobacco: Never   Tobacco comments:    Married, 1 stepson, 5 g-kids. Retired Emergency planning/management officer, now Environmental health practitioner Use: Never used  Substance Use Topics   Alcohol use: Yes    Comment: occasional wine   Drug use: No     Allergies   Atorvastatin   Review of Systems Review of Systems  Constitutional:  Positive for activity change, appetite change, fatigue and fever.  HENT:  Positive for congestion, postnasal drip, sinus pressure and sore throat. Negative for sneezing.   Respiratory:  Positive for cough. Negative for shortness of breath.   Cardiovascular:  Negative for chest pain.  Gastrointestinal:  Negative for abdominal pain, diarrhea, nausea and vomiting.  Musculoskeletal:  Positive for arthralgias and myalgias.  Neurological:  Positive for headaches. Negative for dizziness and light-headedness.    Physical Exam Triage Vital Signs ED Triage Vitals  Enc Vitals Group     BP 01/11/21 1118 (!) 137/91     Pulse Rate 01/11/21 1118 62     Resp 01/11/21 1118 15     Temp 01/11/21 1118 98.3 F (36.8 C)     Temp Source 01/11/21 1118 Oral     SpO2 01/11/21 1118 98 %      Weight --      Height --      Head Circumference --      Peak Flow --      Pain Score 01/11/21 1117 4     Pain Loc --      Pain Edu? --      Excl. in [place]? --    No data found.  Updated Vital Signs BP (!) 137/91 (BP Location: Right Arm)    Pulse 62    Temp 98.3 F (36.8 C) (Oral)    Resp 15    SpO2 98%   Visual Acuity Right Eye Distance:   Left Eye Distance:   Bilateral Distance:    Right Eye Near:   Left Eye Near:    Bilateral Near:     Physical Exam Vitals reviewed.  Constitutional:      General: He is awake.     Appearance: Normal appearance. He is well-developed. He is not ill-appearing.     Comments: Very pleasant male appears stated age no acute distress sitting comfortably in exam room  HENT:     Head: Normocephalic and atraumatic.     Right Ear: Tympanic membrane, ear canal and external ear normal. Tympanic membrane is not erythematous or bulging.     Left Ear: Tympanic membrane, ear canal and external ear normal. Tympanic membrane is not erythematous or bulging.     Nose: Nose normal.     Mouth/Throat:     Pharynx: Uvula midline. Posterior oropharyngeal erythema present. No oropharyngeal exudate or uvula swelling.  Cardiovascular:     Rate and Rhythm: Normal rate and regular rhythm.     Heart sounds: Normal heart sounds, S1 normal and S2 normal. No murmur heard. Pulmonary:     Effort: Pulmonary effort is normal. No accessory muscle usage or respiratory distress.     Breath sounds: Normal breath sounds. No stridor. No wheezing, rhonchi or rales.  Comments: Reactive cough with deep breathing Abdominal:     General: Bowel sounds are normal.     Palpations: Abdomen is soft.     Tenderness: There is no abdominal tenderness.  Neurological:     Mental Status: He is alert.  Psychiatric:        Behavior: Behavior is cooperative.     UC Treatments / Results  Labs (all labs ordered are listed, but only abnormal results are displayed) Labs Reviewed  SARS  CORONAVIRUS 2 (TAT 6-24 HRS)  POCT RAPID STREP A, ED / UC  POC INFLUENZA A AND B ANTIGEN (URGENT CARE ONLY)    EKG   Radiology No results found.  Procedures Procedures (including critical care time)  Medications Ordered in UC Medications - No data to display  Initial Impression / Assessment and Plan / UC Course  I have reviewed the triage vital signs and the nursing notes.  Pertinent labs & imaging results that were available during my care of the patient were reviewed by me and considered in my medical decision making (see chart for details).      Patient is well-appearing, nontoxic afebrile, nontachycardic in clinic today.  Discussed likely viral etiology given short duration of symptoms.  No indication for chest x-ray given oxygen saturation of 98% and normal lung exam.  Flu and strep testing were negative in clinic.  COVID testing is pending.  No evidence of acute infection that would warrant initiation of antibiotics.  Patient was encouraged to use conservative treatment including Tylenol, Mucinex, Flonase for symptom relief.  He was prescribed Tessalon for cough.  He is to rest and drink plenty of fluid.  Discussed that if he has any worsening symptoms including high fever not responding to medication, chest pain, shortness of breath, worsening cough he needs to be reevaluated immediately.  If symptoms or not improving within a week he should follow-up with either our clinic or PCP.  Strict return precautions given to which he expressed understanding.  Final Clinical Impressions(s) / UC Diagnoses   Final diagnoses:  Viral URI with cough     Discharge Instructions      Your strep and flu were negative.  We will contact you if your COVID test is positive.  Use Tessalon 3 times a day for cough.  He can use Mucinex, Flonase, Tylenol for additional symptom relief.  If your symptoms or not improving after a week or if at any point you have a worsening of symptoms need to be seen  immediately.  If you have severe symptoms including high fever not responding to medication, nausea/vomiting interfering with oral intake, shortness of breath, chest pain, worsening cough you need to go to the emergency room.  If symptoms have not worsened but are not improving follow-up with either our clinic or your primary care provider next week.     ED Prescriptions     Medication Sig Dispense Auth. Provider   benzonatate (TESSALON) 200 MG capsule Take 1 capsule (200 mg total) by mouth 3 (three) times daily as needed for cough. 20 capsule Dewel Lotter, Derry Skill, PA-C      PDMP not reviewed this encounter.   Terrilee Croak, PA-C 01/11/21 1208

## 2021-01-11 NOTE — ED Triage Notes (Signed)
Pt presents with a cough, sore throat, fever and sinus congestion X 3 days.   States his ears hurt and states he has taken 3 COVID tests that have been negative.

## 2021-01-12 ENCOUNTER — Telehealth: Payer: Self-pay | Admitting: Internal Medicine

## 2021-01-12 ENCOUNTER — Encounter: Payer: Self-pay | Admitting: Internal Medicine

## 2021-01-12 LAB — SARS CORONAVIRUS 2 (TAT 6-24 HRS): SARS Coronavirus 2: NEGATIVE

## 2021-01-12 NOTE — Telephone Encounter (Signed)
Responded to patient via mychart

## 2021-01-12 NOTE — Telephone Encounter (Signed)
Connected to [facility] 12.26.2022.    Caller states he has been sick for the past couple days. He has a bad sore throat and coughing. He is also congested along with earache and headache. Temp is 99.9. COVID test was positive. Symptoms started Sat after being around a lot of people of Wed.   Advised to see PCP within 24 hours.

## 2021-01-13 ENCOUNTER — Telehealth (INDEPENDENT_AMBULATORY_CARE_PROVIDER_SITE_OTHER): Payer: Medicare Other | Admitting: Family Medicine

## 2021-01-13 ENCOUNTER — Other Ambulatory Visit: Payer: Self-pay

## 2021-01-13 ENCOUNTER — Encounter: Payer: Self-pay | Admitting: Family Medicine

## 2021-01-13 VITALS — Temp 99.9°F | Ht 68.0 in | Wt 148.0 lb

## 2021-01-13 DIAGNOSIS — I2510 Atherosclerotic heart disease of native coronary artery without angina pectoris: Secondary | ICD-10-CM

## 2021-01-13 DIAGNOSIS — J069 Acute upper respiratory infection, unspecified: Secondary | ICD-10-CM | POA: Diagnosis not present

## 2021-01-13 MED ORDER — AMOXICILLIN-POT CLAVULANATE 875-125 MG PO TABS: 1.0000 | ORAL_TABLET | Freq: Two times a day (BID) | ORAL | 0 refills | Status: DC

## 2021-01-13 NOTE — Progress Notes (Signed)
Patient ID: Joseph Booker, male   DOB: 08/23/1950, 70 y.o.   MRN: [id number]   This visit type was conducted due to national recommendations for restrictions regarding the COVID-19 pandemic in an effort to limit this patient's exposure and mitigate transmission in our community.   Virtual Visit via Video Note  I connected with Dartanyan Deasis on 01/13/21 at  5:00 PM EST by a video enabled telemedicine application and verified that I am speaking with the correct person using two identifiers.  Location patient: home Location provider:work or home office Persons participating in the virtual visit: patient, provider  I discussed the limitations of evaluation and management by telemedicine and the availability of in person appointments. The patient expressed understanding and agreed to proceed.   HPI: Peace was seen as a work in virtually with upper respiratory symptoms.  He states he had onset around Saturday.  He ended up going on the 26th of this month to urgent care.  He had further testing with influenza screen, COVID-19 screen, and group A strep which were all negative.  He has symptoms of sore throat, cough, chills, low-grade fever up to 101.3.  He has developed past couple days some increasing upper teeth pain and occasional earache.  Pulse oximetry 93%.  No respiratory distress.  Denies any nausea, vomiting, or diarrhea.  He is concerned because he has had pneumonia at least couple times in the past.  He specifically has done well with Levaquin in the past.  He does have a history of reported dilated aortic root.   ROS: See pertinent positives and negatives per HPI.  Past Medical History:  Diagnosis Date   Allergic rhinitis    Allergy    Anxiety state, unspecified    Ascending aortic aneurysm 05/15/2020   CAD (coronary artery disease)    [name] / nuclear 2005, excellent tolerance no scar or ischemia, EF 51% / nuclear, February, 2012, no scar or ischemia / catheterization March 26, 2010.. 10% in-stent restenosis, minimal other nonobstructive coronary disease, ejection fraction 55%., excellent result; ETT 9/13: ex 13:14, no CP, no ischemic ECG changes   Cancer (HCC)    basal cell carcinoma x2 on face   Cataract    surgery to both eyes   Elevated blood pressure reading 05/15/2020   Glaucoma, left eye    left eye   History of exercise intolerance 05/23/2013   cardiopulmonary exercise test-- normal functional capacity when compared to matched sedentary norms, borderline ventilatory limitation with the patient's exercise tidal volume reaching 90% of the measured inspiratory capacity ( pt reports no dyspnea), did not appear to be circulatory limitation, normal CPX test   Hyperlipidemia    Nocturia    Osteopenia    Thoracic ascending aortic aneurysm    followed by dr Skeet Latch---  last CT 12-29-2016 ,  4.5cm   Ulcerative colitis, universal ([place])    GI-- [name]--  dx 1990s  (per note mild Mayo Score 1)   Wears glasses     Past Surgical History:  Procedure Laterality Date   CARDIAC CATHETERIZATION  03/26/2010    dr Dian Queen   single vessel CAD w/ patent mLAD stent w/ 10% in-stent restenosis w/ mild to moderate disease in the remainder LAD w/ no lesions that appearedto be flow-limitinf/  normal LVFSF, ef 55%   CARDIOVASCULAR STRESS TEST  06-30-2016   [name]   Low risk nuclear study w/ no evidence ishcemia/  normal LV function and wall motion,  nuclear stress  ef 55%   COLONOSCOPY  last one 10-27-2016   CORONARY ANGIOPLASTY WITH STENT PLACEMENT  07-25-2002   [name]   PTCA w/  DES x1 to [place] Left 2006   INGUINAL HERNIA REPAIR Right 07/03/2017   Procedure: OPEN REPAIR OF RIGHT INGUINAL HERNIA WITH MESH;  Surgeon: Kinsinger, Arta , MD;  Location: [place] [facility];  Service: General;  Laterality: Right;   INSERTION OF MESH Right 07/03/2017   Procedure: INSERTION OF MESH;  Surgeon: Kieth Brightly Arta , MD;  Location:  [place];  Service: General;  Laterality: Right;   LEFT HEART CATH AND CORONARY ANGIOGRAPHY N/A 09/11/2020   Procedure: LEFT HEART CATH AND CORONARY ANGIOGRAPHY;  Surgeon: Jettie Booze, MD;  Location: [place] [facility];  Service: Cardiovascular;  Laterality: N/A;   SEPTOPLASTY  1987   w/ RHINOPLASTY   THORACIC AORTOGRAM N/A 09/11/2020   Procedure: THORACIC AORTOGRAM;  Surgeon: Jettie Booze, MD;  Location: [place] [facility];  Service: Cardiovascular;  Laterality: N/A;   TONSILLECTOMY  1959   TRANSTHORACIC ECHOCARDIOGRAM  06-30-2016     dr Alison Murray 55-60%/  trivial AR/ mild dilated ascending aorta, 38mm/  trivial MR/  mild PR and TR     Family History  Problem Relation Age of Onset   Diabetes Mother    Heart disease Mother    Lung disease Mother        ILD   Rheum arthritis Mother    CAD Mother    Heart disease Father 69       MI   Heart attack Father    Asthma Sister    Colon cancer Neg Hx    Esophageal cancer Neg Hx    Pancreatic cancer Neg Hx    Kidney disease Neg Hx    Liver disease Neg Hx    Rectal cancer Neg Hx    Stomach cancer Neg Hx     SOCIAL HX: Non-smoker   Current Outpatient Medications:    amoxicillin-clavulanate (AUGMENTIN) 875-125 MG tablet, Take 1 tablet by mouth 2 (two) times daily., Disp: 20 tablet, Rfl: 0   aspirin 81 MG tablet, Take 81 mg by mouth daily., Disp: , Rfl:    clopidogrel (PLAVIX) 75 MG tablet, TAKE 1 TABLET BY MOUTH EVERY DAY, Disp: 90 tablet, Rfl: 1   Coenzyme Q10 (CO Q-10) 300 MG CAPS, Take 300 mg by mouth every morning., Disp: , Rfl:    latanoprost (XALATAN) 0.005 % ophthalmic solution, Place 1 drop into the left eye daily., Disp: , Rfl:    mesalamine (APRISO) 0.375 g 24 hr capsule, TAKE 4 CAPSULES BY MOUTH EVERY DAY, Disp: 360 capsule, Rfl: 2   Probiotic Product (VSL#3) CAPS, Take 1 capsule by mouth once a week., Disp: , Rfl:    REPATHA SURECLICK 245 MG/ML SOAJ, INJECT 140 MG INTO THE SKIN EVERY 14  (FOURTEEN) DAYS., Disp: 2 mL, Rfl: 11   timolol (TIMOPTIC) 0.5 % ophthalmic solution, Place 1 drop into both eyes 2 (two) times daily. , Disp: , Rfl:    benzonatate (TESSALON) 200 MG capsule, Take 1 capsule (200 mg total) by mouth 3 (three) times daily as needed for cough. (Patient not taking: Reported on 01/13/2021), Disp: 20 capsule, Rfl: 0  EXAM:  VITALS per patient if applicable:  GENERAL: alert, oriented, appears well and in no acute distress  HEENT: atraumatic, conjunttiva clear, no obvious abnormalities on inspection of external nose and ears  NECK: normal movements of the head and neck  LUNGS: on inspection no signs of respiratory distress, breathing rate appears normal, no obvious gross SOB, gasping or wheezing  CV: no obvious cyanosis  MS: moves all visible extremities without noticeable abnormality  PSYCH/NEURO: pleasant and cooperative, no obvious depression or anxiety, speech and thought processing grossly intact  ASSESSMENT AND PLAN:  Discussed the following assessment and plan:  Upper respiratory infection with cough.  Multiple test as above negative including influenza, COVID, group A strep.  We did discuss this all may be viral but he if anything feels worse and has history of recurrent pneumonia in the past. He is developing some sinusitis symptoms with intermittent headache and upper teeth pain.  -We agreed to send in Augmentin 875 mg twice daily for 10 days.  He is aware of potential side effects including diarrhea. -Follow-up promptly for any increasing fever or any persistent or worsening symptoms     I discussed the assessment and treatment plan with the patient. The patient was provided an opportunity to ask questions and all were answered. The patient agreed with the plan and demonstrated an understanding of the instructions.   The patient was advised to call back or seek an in-person evaluation if the symptoms worsen or if the condition fails to improve  as anticipated.     Carolann Littler, MD

## 2021-02-11 ENCOUNTER — Telehealth (HOSPITAL_BASED_OUTPATIENT_CLINIC_OR_DEPARTMENT_OTHER): Payer: Self-pay | Admitting: Cardiovascular Disease

## 2021-02-11 NOTE — Telephone Encounter (Signed)
Received fax from [facility] stating Joseph Booker is approved for [facility] from 01/17/21-02/10/22.

## 2021-02-26 DIAGNOSIS — D2272 Melanocytic nevi of left lower limb, including hip: Secondary | ICD-10-CM | POA: Diagnosis not present

## 2021-02-26 DIAGNOSIS — L821 Other seborrheic keratosis: Secondary | ICD-10-CM | POA: Diagnosis not present

## 2021-02-26 DIAGNOSIS — D2261 Melanocytic nevi of right upper limb, including shoulder: Secondary | ICD-10-CM | POA: Diagnosis not present

## 2021-02-26 DIAGNOSIS — B079 Viral wart, unspecified: Secondary | ICD-10-CM | POA: Diagnosis not present

## 2021-02-26 DIAGNOSIS — D2271 Melanocytic nevi of right lower limb, including hip: Secondary | ICD-10-CM | POA: Diagnosis not present

## 2021-02-26 DIAGNOSIS — D485 Neoplasm of uncertain behavior of skin: Secondary | ICD-10-CM | POA: Diagnosis not present

## 2021-02-26 DIAGNOSIS — L565 Disseminated superficial actinic porokeratosis (DSAP): Secondary | ICD-10-CM | POA: Diagnosis not present

## 2021-04-26 ENCOUNTER — Other Ambulatory Visit (HOSPITAL_BASED_OUTPATIENT_CLINIC_OR_DEPARTMENT_OTHER): Payer: Self-pay | Admitting: Cardiovascular Disease

## 2021-05-01 ENCOUNTER — Other Ambulatory Visit (HOSPITAL_BASED_OUTPATIENT_CLINIC_OR_DEPARTMENT_OTHER): Payer: Self-pay | Admitting: Cardiovascular Disease

## 2021-05-03 ENCOUNTER — Encounter (HOSPITAL_BASED_OUTPATIENT_CLINIC_OR_DEPARTMENT_OTHER): Payer: Self-pay | Admitting: Cardiovascular Disease

## 2021-05-03 ENCOUNTER — Ambulatory Visit (INDEPENDENT_AMBULATORY_CARE_PROVIDER_SITE_OTHER): Payer: Medicare Other | Admitting: Cardiovascular Disease

## 2021-05-03 DIAGNOSIS — I7121 Aneurysm of the ascending aorta, without rupture: Secondary | ICD-10-CM

## 2021-05-03 DIAGNOSIS — R001 Bradycardia, unspecified: Secondary | ICD-10-CM | POA: Diagnosis not present

## 2021-05-03 DIAGNOSIS — E7800 Pure hypercholesterolemia, unspecified: Secondary | ICD-10-CM

## 2021-05-03 DIAGNOSIS — I2510 Atherosclerotic heart disease of native coronary artery without angina pectoris: Secondary | ICD-10-CM

## 2021-05-03 DIAGNOSIS — I340 Nonrheumatic mitral (valve) insufficiency: Secondary | ICD-10-CM | POA: Diagnosis not present

## 2021-05-03 HISTORY — DX: Bradycardia, unspecified: R00.1

## 2021-05-03 NOTE — Assessment & Plan Note (Signed)
Chronic and asymptomatic.  Avoid nodal agents. ?

## 2021-05-03 NOTE — Telephone Encounter (Signed)
Rx(s) sent to pharmacy electronically.  

## 2021-05-03 NOTE — Progress Notes (Signed)
? ?Cardiology Office Visit  ? ? ?Date:  05/03/2021  ? ?ID:  Joseph Booker, DOB [birth year], MRN [id number] ? ?PCP:  Binnie Rail, MD  ?Cardiologist:  Skeet Latch, MD  ?Electrophysiologist:  None  ? ?Evaluation Performed:  Follow-Up Visit ? ?Chief Complaint:  Hyperlipidemia ? ?History of Present Illness:   ? ?Joseph Booker is a 71 y.o. male with CAD, hyperlipidemia, and mild ascending aorta aneurysm here for follow up.  Joseph Booker had an LAD PCI in 2004. He had a [employer] 06/2016 that revealed LVEF 55% and no ischemia.  He last saw [name] 02/15/17 at which time he reported some non-exertional shortness of breath.  This has been an ongoing complaint for years.  He had a CPX in 2015 that was normal.  He saw an allergist who suggested that he might have inflammation in his lungs.  His cholesterol is not well controlled on simvastatin so this was switched to atorvastatin.  However his LDL remained above 70 at 40 mg.  It was increased to 80 mg but he developed transaminitis.  He started on Repatha and tolerated it well.  His liver enzymes have been stable. ? ?He had a repeat CT 04/29/20 that showed a stable ascending aortic aneurysm at 4.5 cm. He reported exertional dyspnea. He had a nuclear stress test 06/2020 that showed LVEF 48% and frequent PVC's. Echo 07/2020 showed LVEF 60-65%. Ascending aorta was 4.3 cm. He was seen in the office 08/2020 with chest pain. He underwent left heart cath 08/2020 which showed a patent LAD stent and otherwise mild disease. Today, he is feeling fine overall. At home he has not checked his BP recently, but when he does check he usually has readings ranging from 125-130/75-80. Lately he is walking 30-45 miles a week for exercise. Aside from diaphoresis and fatigue, he generally feels well while walking. No exertional chest pain or shortness of breath. He denies any palpitations, or peripheral edema. No lightheadedness, headaches, syncope, orthopnea, or PND. ? ? ?Past Medical History:   ?Diagnosis Date  ? Allergic rhinitis   ? Allergy   ? Anxiety state, unspecified   ? Ascending aortic aneurysm ([place]) 05/15/2020  ? Bradycardia 05/03/2021  ? CAD (coronary artery disease)   ? [name] / nuclear 2005, excellent tolerance no scar or ischemia, EF 51% / nuclear, February, 2012, no scar or ischemia / catheterization March 26, 2010.. 10% in-stent restenosis, minimal other nonobstructive coronary disease, ejection fraction 55%., excellent result; ETT 9/13: ex 13:14, no CP, no ischemic ECG changes  ? Cancer [facility])   ? basal cell carcinoma x2 on face  ? Cataract   ? surgery to both eyes  ? Elevated blood pressure reading 05/15/2020  ? Glaucoma, left eye   ? left eye  ? History of exercise intolerance 05/23/2013  ? cardiopulmonary exercise test-- normal functional capacity when compared to matched sedentary norms, borderline ventilatory limitation with the patient's exercise tidal volume reaching 90% of the measured inspiratory capacity ( pt reports no dyspnea), did not appear to be circulatory limitation, normal CPX test  ? Hyperlipidemia   ? Nocturia   ? Osteopenia   ? Pure hypercholesterolemia 06/12/2009  ? Thoracic ascending aortic aneurysm ([place])   ? followed by dr Skeet Latch---  last CT 12-29-2016 ,  4.5cm  ? Ulcerative colitis, universal ([place])   ? GI-- [name]--  dx 1990s  (per note mild Mayo Score 1)  ? Wears glasses   ? ?Past Surgical History:  ?Procedure  Laterality Date  ? CARDIAC CATHETERIZATION  03/26/2010    dr Dian Queen  ? single vessel CAD w/ patent mLAD stent w/ 10% in-stent restenosis w/ mild to moderate disease in the remainder LAD w/ no lesions that appearedto be flow-limitinf/  normal LVFSF, ef 55%  ? CARDIOVASCULAR STRESS TEST  06-30-2016   [name]  ? Low risk nuclear study w/ no evidence ishcemia/  normal LV function and wall motion,  nuclear stress ef 55%  ? COLONOSCOPY  last one 10-27-2016  ? CORONARY ANGIOPLASTY WITH STENT PLACEMENT  07-25-2002   [name]  ? PTCA w/   DES x1 to mLAD  ? INGUINAL HERNIA REPAIR Left 2006  ? INGUINAL HERNIA REPAIR Right 07/03/2017  ? Procedure: OPEN REPAIR OF RIGHT INGUINAL HERNIA WITH MESH;  Surgeon: Kinsinger, Arta Bruce, MD;  Location: [facility];  Service: General;  Laterality: Right;  ? INSERTION OF MESH Right 07/03/2017  ? Procedure: INSERTION OF MESH;  Surgeon: Kinsinger, Arta Bruce, MD;  Location: [facility];  Service: General;  Laterality: Right;  ? LEFT HEART CATH AND CORONARY ANGIOGRAPHY N/A 09/11/2020  ? Procedure: LEFT HEART CATH AND CORONARY ANGIOGRAPHY;  Surgeon: Jettie Booze, MD;  Location: [place] [facility];  Service: Cardiovascular;  Laterality: N/A;  ? SEPTOPLASTY  1987  ? w/ RHINOPLASTY  ? THORACIC AORTOGRAM N/A 09/11/2020  ? Procedure: THORACIC AORTOGRAM;  Surgeon: Jettie Booze, MD;  Location: [place] [facility];  Service: Cardiovascular;  Laterality: N/A;  ? TONSILLECTOMY  1959  ? TRANSTHORACIC ECHOCARDIOGRAM  06-30-2016     [name]  ? ef 55-60%/  trivial AR/ mild dilated ascending aorta, 30m/  trivial MR/  mild PR and TR   ?  ? ?Current Meds  ?Medication Sig  ? aspirin 81 MG tablet Take 81 mg by mouth daily.  ? clopidogrel (PLAVIX) 75 MG tablet Take 1 tablet by mouth once daily  ? Coenzyme Q10 (CO Q-10) 300 MG CAPS Take 300 mg by mouth every morning.  ? latanoprost (XALATAN) 0.005 % ophthalmic solution Place 1 drop into the left eye daily.  ? mesalamine (APRISO) 0.375 g 24 hr capsule TAKE 4 CAPSULES BY MOUTH EVERY DAY  ? Probiotic Product (VSL#3) CAPS Take 1 capsule by mouth once a week.  ? REPATHA SURECLICK 1664MG/ML SOAJ INJECT 140 MG INTO THE SKIN EVERY 14 (FOURTEEN) DAYS.  ? timolol (TIMOPTIC) 0.5 % ophthalmic solution Place 1 drop into both eyes 2 (two) times daily.   ?  ? ?Allergies:   Atorvastatin  ? ?Social History  ? ?Tobacco Use  ? Smoking status: Never  ? Smokeless tobacco: Never  ? Tobacco comments:  ?  Married, 1 stepson, 5 g-kids. 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 Vaping Use: Never used  ?Substance Use Topics  ? Alcohol use: Yes  ?  Comment: occasional wine  ? Drug use: No  ?  ? ?Family Hx: ?The patient's family history includes Asthma in his sister; CAD in his mother; Diabetes in his mother; Heart attack in his father; Heart disease in his mother; Heart disease (age of onset: 590 in his father; Lung disease in his mother; Rheum arthritis in his mother. There is no history of Colon cancer, Esophageal cancer, Pancreatic cancer, Kidney disease, Liver disease, Rectal cancer, or Stomach cancer. ? ?ROS:   ?Please see the history of present illness.    ?(+) Diaphoresis ?(+) Fatigue ?All other systems reviewed and are  negative. ? ? ?Prior CV studies:   ?The following studies were reviewed today: ? ?Left Heart Cath/Aortogram 09/11/2020: ?  Prox LAD lesion is 40% stenosed seen in cranial views only.  Eccentric lesion. ?  Prox RCA lesion is 25% stenosed. ?  Mid LAD-2 lesion is 30% stenosed. ?  Previously placed Mid LAD-1 stent (unknown type) is  widely patent. ?  LV end diastolic pressure is normal. ?  There is no aortic valve stenosis. ?  Dilated aortic root. ?  ?Patent stent.  Mild to moderate scattered nonobstructive CAD. ?  ?Dilated aortic root to be followed with serial imaging.  ? ?Diagnostic ?Dominance: Right ? ? ?Echo 07/17/2020: ? 1. Left ventricular ejection fraction, by estimation, is 60 to 65%. Left  ?ventricular ejection fraction by 3D volume is 67 %. The left ventricle has  ?normal function. The left ventricle has no regional wall motion  ?abnormalities. Left ventricular diastolic  ? parameters were normal.  ? 2. Right ventricular systolic function is normal. The right ventricular  ?size is normal. There is normal pulmonary artery systolic pressure. The  ?estimated right ventricular systolic pressure is 48.2 mmHg.  ? 3. The mitral valve is normal in structure. Trivial mitral valve  ?regurgitation. No evidence of mitral stenosis.  ? 4. The aortic valve  is tricuspid. Aortic valve regurgitation is mild. No  ?aortic stenosis is present.  ? 5. Aortic dilatation noted. There is mild dilatation of the ascending  ?aorta, measuring 43 mm. There is mild dilatation of

## 2021-05-03 NOTE — Assessment & Plan Note (Addendum)
LDL goal less than 70.  Continue Repatha.  Keep up the exercise.  Repeat lipids and CMP 12/2021. ?

## 2021-05-03 NOTE — Assessment & Plan Note (Signed)
Status post LAD PCI in 2004.  He had a cath 08/2020 that revealed a patent stent and otherwise mild, non-obstructive disease.  Continue clopidogrel.  Lipids are very well controlled on Repatha. ?

## 2021-05-03 NOTE — Assessment & Plan Note (Signed)
Only trivial leaking on echo 07/2020. ?

## 2021-05-03 NOTE — Assessment & Plan Note (Signed)
Stable on prior imaging.  Again stressed the importance of blood pressure control.  Repeat echo 12/2021.  He is not on a beta-blocker due to bradycardia. ?

## 2021-05-03 NOTE — Patient Instructions (Signed)
Medication Instructions:  ?Your physician recommends that you continue on your current medications as directed. Please refer to the Current Medication list given to you today.  ? ?*If you need a refill on your cardiac medications before your next appointment, please call your pharmacy* ? ?Lab Work: ?FASTING LP/CMET PRIOR TO FOLLOW UP IN December  ? ?If you have labs (blood work) drawn today and your tests are completely normal, you will receive your results only by: ?MyChart Message (if you have MyChart) OR ?A paper copy in the mail ?If you have any lab test that is abnormal or we need to change your treatment, we will call you to review the results. ? ?Testing/Procedures: ?Your physician has requested that you have an echocardiogram. Echocardiography is a painless test that uses sound waves to create images of your heart. It provides your doctor with information about the size and shape of your heart and how well your heart?s chambers and valves are working. This procedure takes approximately one hour. There are no restrictions for this procedure. ?PRIOR TO FOLLOW UP IN December  ? ?Follow-Up: ?At [facility], you and your health needs are our priority.  As part of our continuing mission to provide you with exceptional heart care, we have created designated Provider Care Teams.  These Care Teams include your primary Cardiologist (physician) and Advanced Practice Providers (APPs -  Physician Assistants and Nurse Practitioners) who all work together to provide you with the care you need, when you need it. ? ?We recommend signing up for the patient portal called "MyChart".  Sign up information is provided on this After Visit Summary.  MyChart is used to connect with patients for Virtual Visits (Telemedicine).  Patients are able to view lab/test results, encounter notes, upcoming appointments, etc.  Non-urgent messages can be sent to your provider as well.   ?To learn more about what you can do with MyChart, go to  [email or web address].   ? ?Your next appointment:   ?8 month(s) ? ?The format for your next appointment:   ?In Person ? ?Provider:   ?Skeet Latch, MD  ? ? ?Other Instructions ?MONITOR YOUR BLOOD PRESSURE AT HOME BRING YOUR READINGS AND MACHINE TO FOLLOW UP IN DECEMBER  ? ?Important Information About Sugar ? ? ? ? ? ?

## 2021-07-08 DIAGNOSIS — H401111 Primary open-angle glaucoma, right eye, mild stage: Secondary | ICD-10-CM | POA: Diagnosis not present

## 2021-07-08 DIAGNOSIS — H401122 Primary open-angle glaucoma, left eye, moderate stage: Secondary | ICD-10-CM | POA: Diagnosis not present

## 2021-07-09 ENCOUNTER — Other Ambulatory Visit: Payer: Self-pay

## 2021-07-09 ENCOUNTER — Telehealth: Payer: Self-pay | Admitting: Gastroenterology

## 2021-07-09 MED ORDER — MESALAMINE 4 G RE ENEM: 4.0000 g | ENEMA | Freq: Every day | RECTAL | 0 refills | Status: DC

## 2021-07-12 ENCOUNTER — Telehealth: Payer: Self-pay | Admitting: Gastroenterology

## 2021-07-12 DIAGNOSIS — R197 Diarrhea, unspecified: Secondary | ICD-10-CM

## 2021-07-12 DIAGNOSIS — K5190 Ulcerative colitis, unspecified, without complications: Secondary | ICD-10-CM

## 2021-07-12 NOTE — Telephone Encounter (Signed)
Patient called stating he was experiencing severe dizziness, with nausea & vomiting, and wanted to know if it was possibly a side effect of the Rowasa he was taking.  Pleased call patient and advise.  Thank you.

## 2021-07-13 ENCOUNTER — Other Ambulatory Visit: Payer: Self-pay

## 2021-07-13 MED ORDER — ONDANSETRON 4 MG PO TBDP
4.0000 mg | ORAL_TABLET | Freq: Every day | ORAL | 0 refills | Status: DC | PRN
Start: 2021-07-13 — End: 2021-11-04

## 2021-07-15 NOTE — Telephone Encounter (Signed)
Patient scheduled for 10/07/21

## 2021-07-19 NOTE — Telephone Encounter (Signed)
Dr. Lorenso Courier as DOD PM of 07/19/21 please advise.  Dr. Woodward Ku pt with hx of UC, taking Mesalamine 4 capsules daily. Pt did not respond well to Rowasa enemas that were sent in last week and he has stopped using that. Pt reports that his symptoms have been going on for a couple of weeks. He reports urgency, frequency, diarrhea and mucous. Pt denies any blood. He reports having about 7-8, small volume stools a day. Some abdominal discomfort, more to the left side. Denies any fever. He has been scheduled for a f/u with Dr. Silverio Decamp on Monday, 08/09/21 at 11 am. Please advise if there is something that we can send in for patient's possible UC flare. Thanks

## 2021-07-19 NOTE — Telephone Encounter (Signed)
Patient called requesting some type of medication for his colitis. Please advise.

## 2021-07-19 NOTE — Telephone Encounter (Signed)
Lm on vm for patient to return call 

## 2021-07-21 ENCOUNTER — Other Ambulatory Visit: Payer: Medicare Other

## 2021-07-21 NOTE — Telephone Encounter (Signed)
Called and spoke with patient regarding Dr. Libby Maw recommendations. Pt will go by the lab in the basement of our office building to pick up stool kits. Pt knows that if his C. Diff stool test is negative then we can prescribe Prednisone. Pt is aware that it will take a few days for the results to return. Pt is aware that we will also send this to Dr. Silverio Decamp to see if she has any further recommendations. Pt is aware that Dr. Silverio Decamp is out of the office this week and will return next week. Pt verbalized understanding and had no concerns at the end of the call.

## 2021-07-21 NOTE — Telephone Encounter (Signed)
Please have the patient drop off stool samples for C dif PCR and GI profile as soon as possible. Once stool tests are negative for C dif, then would be okay to start the patient on prednisone 40 mg QD to help with symptom control. Please let Dr. Silverio Decamp know in case she has additional recommendations and would prefer to go ahead and start the patient on steroids.

## 2021-07-21 NOTE — Addendum Note (Signed)
Addended by: Yevette Edwards on: 07/21/2021 08:49 AM   Modules accepted: Orders

## 2021-07-22 ENCOUNTER — Other Ambulatory Visit: Payer: Medicare Other

## 2021-07-22 DIAGNOSIS — H52203 Unspecified astigmatism, bilateral: Secondary | ICD-10-CM | POA: Diagnosis not present

## 2021-07-22 DIAGNOSIS — H401123 Primary open-angle glaucoma, left eye, severe stage: Secondary | ICD-10-CM | POA: Diagnosis not present

## 2021-07-22 DIAGNOSIS — K5190 Ulcerative colitis, unspecified, without complications: Secondary | ICD-10-CM

## 2021-07-22 DIAGNOSIS — Z961 Presence of intraocular lens: Secondary | ICD-10-CM | POA: Diagnosis not present

## 2021-07-22 DIAGNOSIS — H401111 Primary open-angle glaucoma, right eye, mild stage: Secondary | ICD-10-CM | POA: Diagnosis not present

## 2021-07-22 DIAGNOSIS — A09 Infectious gastroenteritis and colitis, unspecified: Secondary | ICD-10-CM | POA: Diagnosis not present

## 2021-07-22 DIAGNOSIS — R197 Diarrhea, unspecified: Secondary | ICD-10-CM

## 2021-07-24 LAB — GI PROFILE, STOOL, PCR

## 2021-07-24 LAB — CLOSTRIDIUM DIFFICILE BY PCR: Toxigenic C. Difficile by PCR: NEGATIVE

## 2021-07-26 ENCOUNTER — Other Ambulatory Visit: Payer: Self-pay

## 2021-07-26 MED ORDER — PREDNISONE 10 MG PO TABS
ORAL_TABLET | ORAL | 0 refills | Status: DC
Start: 2021-07-26 — End: 2021-08-23

## 2021-08-04 ENCOUNTER — Encounter: Payer: Self-pay | Admitting: Gastroenterology

## 2021-08-09 ENCOUNTER — Other Ambulatory Visit (INDEPENDENT_AMBULATORY_CARE_PROVIDER_SITE_OTHER): Payer: Medicare Other

## 2021-08-09 ENCOUNTER — Ambulatory Visit (INDEPENDENT_AMBULATORY_CARE_PROVIDER_SITE_OTHER): Payer: Medicare Other | Admitting: Gastroenterology

## 2021-08-09 ENCOUNTER — Encounter: Payer: Self-pay | Admitting: Gastroenterology

## 2021-08-09 VITALS — BP 128/84 | HR 55 | Ht 68.0 in | Wt 148.5 lb

## 2021-08-09 DIAGNOSIS — K529 Noninfective gastroenteritis and colitis, unspecified: Secondary | ICD-10-CM

## 2021-08-09 DIAGNOSIS — I2510 Atherosclerotic heart disease of native coronary artery without angina pectoris: Secondary | ICD-10-CM | POA: Diagnosis not present

## 2021-08-09 DIAGNOSIS — E785 Hyperlipidemia, unspecified: Secondary | ICD-10-CM

## 2021-08-09 DIAGNOSIS — K51911 Ulcerative colitis, unspecified with rectal bleeding: Secondary | ICD-10-CM | POA: Diagnosis not present

## 2021-08-09 LAB — SEDIMENTATION RATE: Sed Rate: 8 mm/hr (ref 0–20)

## 2021-08-09 LAB — CBC WITH DIFFERENTIAL/PLATELET
Basophils Absolute: 0 10*3/uL (ref 0.0–0.1)
Basophils Relative: 0.2 % (ref 0.0–3.0)
Eosinophils Absolute: 0 10*3/uL (ref 0.0–0.7)
Eosinophils Relative: 0.1 % (ref 0.0–5.0)
HCT: 48.9 % (ref 39.0–52.0)
Hemoglobin: 16.5 g/dL (ref 13.0–17.0)
Lymphocytes Relative: 11.2 % — ABNORMAL LOW (ref 12.0–46.0)
Lymphs Abs: 1.3 10*3/uL (ref 0.7–4.0)
MCHC: 33.7 g/dL (ref 30.0–36.0)
MCV: 97.6 fl (ref 78.0–100.0)
Monocytes Absolute: 0.2 10*3/uL (ref 0.1–1.0)
Monocytes Relative: 1.8 % — ABNORMAL LOW (ref 3.0–12.0)
Neutro Abs: 10.3 10*3/uL — ABNORMAL HIGH (ref 1.4–7.7)
Neutrophils Relative %: 86.7 % — ABNORMAL HIGH (ref 43.0–77.0)
Platelets: 167 10*3/uL (ref 150.0–400.0)
RBC: 5.01 Mil/uL (ref 4.22–5.81)
RDW: 13.5 % (ref 11.5–15.5)
WBC: 11.8 10*3/uL — ABNORMAL HIGH (ref 4.0–10.5)

## 2021-08-09 LAB — HIGH SENSITIVITY CRP: CRP, High Sensitivity: 0.8 mg/L (ref 0.000–5.000)

## 2021-08-09 LAB — COMPREHENSIVE METABOLIC PANEL
ALT: 15 U/L (ref 0–53)
AST: 19 U/L (ref 0–37)
Albumin: 4.3 g/dL (ref 3.5–5.2)
Alkaline Phosphatase: 43 U/L (ref 39–117)
BUN: 24 mg/dL — ABNORMAL HIGH (ref 6–23)
CO2: 30 mEq/L (ref 19–32)
Calcium: 9.8 mg/dL (ref 8.4–10.5)
Chloride: 100 mEq/L (ref 96–112)
Creatinine, Ser: 1.08 mg/dL (ref 0.40–1.50)
GFR: 70.1 mL/min (ref >60.00)
Glucose, Bld: 119 mg/dL — ABNORMAL HIGH (ref 70–99)
Potassium: 4.1 mEq/L (ref 3.5–5.1)
Sodium: 139 mEq/L (ref 135–145)
Total Bilirubin: 0.7 mg/dL (ref 0.2–1.2)
Total Protein: 7.1 g/dL (ref 6.0–8.3)

## 2021-08-09 LAB — IBC + FERRITIN
Ferritin: 52.4 ng/mL (ref 22.0–322.0)
Iron: 143 ug/dL (ref 42–165)
Saturation Ratios: 43.1 % (ref 20.0–50.0)
TIBC: 331.8 ug/dL (ref 250.0–450.0)
Transferrin: 237 mg/dL (ref 212.0–360.0)

## 2021-08-09 LAB — B12 AND FOLATE PANEL
Folate: >24.2 ng/mL (ref >5.9)
Vitamin B-12: 599 pg/mL (ref 211–911)

## 2021-08-09 MED ORDER — HYDROCORTISONE ACETATE 25 MG RE SUPP: 25.0000 mg | Freq: Every morning | RECTAL | 0 refills | Status: DC

## 2021-08-09 MED ORDER — FOLIC ACID 1 MG PO TABS: 1.0000 mg | ORAL_TABLET | Freq: Every day | ORAL | 1 refills | Status: DC

## 2021-08-09 MED ORDER — MESALAMINE 1000 MG RE SUPP: 1000.0000 mg | Freq: Every day | RECTAL | 0 refills | Status: DC

## 2021-08-09 MED ORDER — PREDNISONE 10 MG PO TABS: ORAL_TABLET | ORAL | 0 refills | Status: DC

## 2021-08-09 MED ORDER — SULFASALAZINE 500 MG PO TABS: 1000.0000 mg | ORAL_TABLET | Freq: Two times a day (BID) | ORAL | 3 refills | Status: DC

## 2021-08-09 NOTE — Progress Notes (Signed)
Joseph Booker    [id number]    11-Apr-1950  Primary Care Physician:Burns, Claudina Lick, MD  Referring Physician: Binnie Rail, MD [place],  [place] [address]   Chief complaint: Rectal bleeding, mucus, UC flare  HPI:  71 year old very pleasant gentleman with history of chronic ulcerative colitis, was in clinical remission on daily mesalamine for almost past 17 years with symptoms of acute UC flare in the past 2 weeks.   He has not noticed any improvement on prednisone 40 mg daily.  GI stool pathogen panel and C. difficile negative  He has frequent urge to have a bowel movement with tenesmus, passes mucus with small amount of blood 15-20 times a day, has 3-4 bowel movements daily.  His symptoms were worse on mesalamine enema  He had a mild flare in April 2022, he was treated with tapered dose of budesonide.  After taking it for 2 months he was experiencing side effects and has since stopped taking it. Prior to the episode of UC flare in April, he was on chronic antibiotics for ocular rosacea.     Colonoscopy April 17, 2020 - The perianal and digital rectal examinations were normal. - Inflammation characterized by altered vascularity, congestion (edema), erythema and scarring was found as medium patches surrounded by normal mucosa in the rectum, in the descending colon, in the splenic flexure, in the transverse colon, in the ascending colon and at the cecum. This was mild in severity, and when compared to previous examinations, the findings are worsened. Biopsies were taken with a cold forceps for histology. - Non-bleeding internal hemorrhoids were found during retroflexion. The hemorrhoids were medium-sized.   1. Surgical [P], right colon - MILDLY ACTIVE CHRONIC COLITIS, CONSISTENT WITH PATIENT'S CLINICAL HISTORY OF ULCERATIVE COLITIS - NEGATIVE FOR GRANULOMAS OR DYSPLASIA 2. Surgical [P], left colon - COLONIC MUCOSA WITH NO SPECIFIC HISTOPATHOLOGIC  CHANGES - NEGATIVE FOR ACUTE INFLAMMATION, FEATURES OF CHRONICITY, GRANULOMAS OR DYSPLASIA   Outpatient Encounter Medications as of 08/09/2021  Medication Sig   aspirin 81 MG tablet Take 81 mg by mouth daily.   clopidogrel (PLAVIX) 75 MG tablet Take 1 tablet by mouth once daily   Coenzyme Q10 (CO Q-10) 300 MG CAPS Take 300 mg by mouth every morning.   latanoprost (XALATAN) 0.005 % ophthalmic solution Place 1 drop into the left eye daily.   mesalamine (APRISO) 0.375 g 24 hr capsule TAKE 4 CAPSULES BY MOUTH EVERY DAY   ondansetron (ZOFRAN-ODT) 4 MG disintegrating tablet Take 1 tablet (4 mg total) by mouth daily as needed for up to 10 doses for nausea or vomiting. (Patient not taking: Reported on 08/09/2021)   predniSONE (DELTASONE) 10 MG tablet 40 mg x 14 days then decrease by 5 mg every 7 days   REPATHA SURECLICK 947 MG/ML SOAJ INJECT 140 MG INTO THE SKIN EVERY 14 DAYS   timolol (TIMOPTIC) 0.5 % ophthalmic solution Place 1 drop into both eyes 2 (two) times daily.    dorzolamide (TRUSOPT) 2 % ophthalmic solution 1 drop 2 (two) times daily.   [DISCONTINUED] mesalamine (ROWASA) 4 g enema Place 60 mLs (4 g total) rectally at bedtime for 28 days.   [DISCONTINUED] Probiotic Product (VSL#3) CAPS Take 1 capsule by mouth once a week.   No facility-administered encounter medications on file as of 08/09/2021.    Allergies as of 08/09/2021 - Review Complete 05/03/2021  Allergen Reaction Noted   Atorvastatin Other (See Comments) 04/04/2019  Past Medical History:  Diagnosis Date   Allergic rhinitis    Allergy    Anxiety state, unspecified    Ascending aortic aneurysm ([place]) 05/15/2020   Bradycardia 05/03/2021   CAD (coronary artery disease)    [name] / nuclear 2005, excellent tolerance no scar or ischemia, EF 51% / nuclear, February, 2012, no scar or ischemia / catheterization March 26, 2010.. 10% in-stent restenosis, minimal other nonobstructive coronary disease, ejection fraction  55%., excellent result; ETT 9/13: ex 13:14, no CP, no ischemic ECG changes   Cancer ([facility])    basal cell carcinoma x2 on face   Cataract    surgery to both eyes   Elevated blood pressure reading 05/15/2020   Glaucoma, left eye    left eye   History of exercise intolerance 05/23/2013   cardiopulmonary exercise test-- normal functional capacity when compared to matched sedentary norms, borderline ventilatory limitation with the patient's exercise tidal volume reaching 90% of the measured inspiratory capacity ( pt reports no dyspnea), did not appear to be circulatory limitation, normal CPX test   Hyperlipidemia    Nocturia    Osteopenia    Pure hypercholesterolemia 06/12/2009   Thoracic ascending aortic aneurysm [facility])    followed by dr Skeet Latch---  last CT 12-29-2016 ,  4.5cm   Ulcerative colitis, universal ([place])    GI-- [name]--  dx 1990s  (per note mild Mayo Score 1)   Wears glasses     Past Surgical History:  Procedure Laterality Date   CARDIAC CATHETERIZATION  03/26/2010    dr Dian Queen   single vessel CAD w/ patent mLAD stent w/ 10% in-stent restenosis w/ mild to moderate disease in the remainder LAD w/ no lesions that appearedto be flow-limitinf/  normal LVFSF, ef 55%   CARDIOVASCULAR STRESS TEST  06-30-2016   [name]   Low risk nuclear study w/ no evidence ishcemia/  normal LV function and wall motion,  nuclear stress ef 55%   COLONOSCOPY  last one 10-27-2016   CORONARY ANGIOPLASTY WITH STENT PLACEMENT  07-25-2002   [name]   PTCA w/  DES x1 to [place] Left 2006   INGUINAL HERNIA REPAIR Right 07/03/2017   Procedure: OPEN REPAIR OF RIGHT INGUINAL HERNIA WITH MESH;  Surgeon: Kinsinger, Arta Bruce, MD;  Location: [facility];  Service: General;  Laterality: Right;   INSERTION OF MESH Right 07/03/2017   Procedure: INSERTION OF MESH;  Surgeon: Kieth Brightly Arta Bruce, MD;  Location: [place] [facility];  Service: General;   Laterality: Right;   LEFT HEART CATH AND CORONARY ANGIOGRAPHY N/A 09/11/2020   Procedure: LEFT HEART CATH AND CORONARY ANGIOGRAPHY;  Surgeon: Jettie Booze, MD;  Location: [place] [facility];  Service: Cardiovascular;  Laterality: N/A;   SEPTOPLASTY  1987   w/ RHINOPLASTY   THORACIC AORTOGRAM N/A 09/11/2020   Procedure: THORACIC AORTOGRAM;  Surgeon: Jettie Booze, MD;  Location: [place] [facility];  Service: Cardiovascular;  Laterality: N/A;   TONSILLECTOMY  1959   TRANSTHORACIC ECHOCARDIOGRAM  06-30-2016     dr Alison Murray 55-60%/  trivial AR/ mild dilated ascending aorta, 4m/  trivial MR/  mild PR and TR     Family History  Problem Relation Age of Onset   Diabetes Mother    Heart disease Mother    Lung disease Mother        ILD   Rheum arthritis Mother    CAD Mother  Heart disease Father 37       MI   Heart attack Father    Asthma Sister    Colon cancer Neg Hx    Esophageal cancer Neg Hx    Pancreatic cancer Neg Hx    Kidney disease Neg Hx    Liver disease Neg Hx    Rectal cancer Neg Hx    Stomach cancer Neg Hx     Social History   Socioeconomic History   Marital status: Married    Spouse name: Not on file   Number of children: 1   Years of education: Not on file   Highest education level: Not on file  Occupational History   Occupation: retired    Fish farm manager: RETIRED  Tobacco Use   Smoking status: Never   Smokeless tobacco: Never   Tobacco comments:    Married, 1 stepson, 5 g-kids. Retired Emergency planning/management officer, now Environmental health practitioner Use: Never used  Substance and Sexual Activity   Alcohol use: Yes    Comment: occasional wine   Drug use: No   Sexual activity: Yes    Partners: Female  Other Topics Concern   Not on file  Social History Narrative   Exercising regularly: walks at least 5 miles a day, 6 days a week   Social Determinants of Radio broadcast assistant Strain: Not on file  Food Insecurity: Not on file  Transportation  Needs: Not on file  Physical Activity: Not on file  Stress: Not on file  Social Connections: Not on file  Intimate Partner Violence: Not on file      Review of systems: All other review of systems negative except as mentioned in the HPI.   Physical Exam: Vitals:   08/09/21 1101  BP: 128/84  Pulse: (!) 55  SpO2: 99%   Body mass index is 22.58 kg/m. Gen:      No acute distress HEENT:  sclera anicteric Abd:      soft, non-tender; no palpable masses, no distension Ext:    No edema Neuro: alert and oriented x 3 Psych: normal mood and affect  Data Reviewed:  Reviewed labs, radiology imaging, old records and pertinent past GI work up   Assessment and Plan/Recommendations:  71 year old very pleasant gentleman with chronic ulcerative colitis, with acute flare of UC symptoms, tenesmus rectal bleeding with mucus.  Prior to this episode he had a mild flare April 2022 following a course of antibiotics.   No significant improvement of his symptoms on prednisone 40 mg daily.  We will plan to do a quick taper of prednisone decrease by 10 mg every 3 days until he reaches 5 mg daily and then stop after 3 days, he has history of glaucoma.  He is at risk for worsening glaucoma with a prolonged steroid taper He had evidence of mildly active colitis on prior colonoscopy in July 2022  We will recheck GI pathogen panel and C. difficile to exclude infectious etiology Plan to start biologic, Rinvoq with induction dose followed by maintenance dose 15 mg daily once approved by insurance Discussed potential risks and benefits in detail with patient  IBD health maintenance: Check CBC, CMP, CRP, TB QuantiFERON gold He will need up-to-date vaccinations including pneumococcal and shingles   Stop mesalamine, start sulfasalazine 1 g twice daily along with folic acid 1 mg daily  Use Canasa suppository at bedtime and hydrocortisone suppository in the morning for 2 to 4 weeks to improve tenesmus and  rectal bleeding  We will consider flexible sigmoidoscopy to exclude CMV colitis or other etiology given refractory symptoms to high-dose steroids  This visit required 40 minutes of patient care (this includes precharting, chart review, review of results, face-to-face time used for counseling as well as treatment plan and follow-up. The patient was provided an opportunity to ask questions and all were answered. The patient agreed with the plan and demonstrated an understanding of the instructions.  Damaris Hippo , MD    CC: Binnie Rail, MD

## 2021-08-09 NOTE — Patient Instructions (Addendum)
We will contact insurance regarding Rinvoq coverage. __________________________________________________  Your provider has requested that you go to the basement level for lab work before leaving today. Press "B" on the elevator. The lab is located at the first door on the left as you exit the elevator. __________________________________________________  It has been recommended to you by your physician that you have a(n) flexible sigmoidoscopy completed. Per your request, we did not schedule the procedure(s) today. Please contact our office at [phone] should/when you decide to have the procedure completed. You will be scheduled for a pre-visit and procedure at that time.   You may remain on plavix for your procedure per [name]. We will have you to complete 1/2 of a sutab prep for your procedure. We can provide a sample if needed. ____________________________________________________  [name] has advised that you be on a prednisone taper. The taper instructions are as follows: Prednisone 30 mg daily x 3 days Prednisone 20 mg daily x 3 days Prednisone 10 mg daily x 3 days Prednisone 5 mg daily x 3 days Then, discontinue.  We have sent the following medications to your pharmacy for you to pick up at your convenience: Prednisone  Canasa suppository-insert per rectum at bedtime  Hydrocortisone suppositories- insert per rectum every morning x 30 days  Sulfasalazine-1 gram twice daily  Folic Acid 1 tablet daily ____________________________________________________  DISCONTINUE mesalamine  ____________________________________________________  Joseph Booker have been scheduled for a follow up with Joseph Newer, PA-C on 09/01/21 at 2:00 pm. If you get scheduled for flexible sigmoidoscopy before that time, you may cancel this appointment. ____________________________________________________  Joseph Booker are scheduled for an appointment with [name] on 10/07/21 at 9:50 am. Please keep this  appointment following sigmoidoscopy.

## 2021-08-10 ENCOUNTER — Other Ambulatory Visit: Payer: Medicare Other

## 2021-08-10 DIAGNOSIS — K51911 Ulcerative colitis, unspecified with rectal bleeding: Secondary | ICD-10-CM | POA: Diagnosis not present

## 2021-08-12 LAB — GI PROFILE, STOOL, PCR

## 2021-08-12 LAB — QUANTIFERON-TB GOLD PLUS
Mitogen-NIL: 6.1 IU/mL
NIL: 0.03 IU/mL
QuantiFERON-TB Gold Plus: NEGATIVE
TB1-NIL: 0.01 IU/mL
TB2-NIL: 0.01 IU/mL

## 2021-08-12 LAB — CLOSTRIDIUM DIFFICILE BY PCR: Toxigenic C. Difficile by PCR: NEGATIVE

## 2021-08-17 ENCOUNTER — Telehealth: Payer: Self-pay | Admitting: *Deleted

## 2021-08-17 ENCOUNTER — Encounter: Payer: Self-pay | Admitting: Gastroenterology

## 2021-08-17 NOTE — Telephone Encounter (Signed)
[name] Please advise

## 2021-08-17 NOTE — Telephone Encounter (Signed)
Let me send this message to Dr Renae Fickle. We will get back to you  Thank You [name] ===View-only below this line===   ----- Message -----      From:Joseph Booker      Sent:08/17/2021  7:25 AM EDT        JJ:[id number] Nandigam, MD   Subject:sulfasalazine dosage   May I safely increase my sulfasalazine dose from 4 tablets/day to 8 tablets/day?  2gm isn't helping my UC.    Thank you.  Joseph Booker

## 2021-08-18 ENCOUNTER — Encounter: Payer: Self-pay | Admitting: Gastroenterology

## 2021-08-19 ENCOUNTER — Other Ambulatory Visit: Payer: Self-pay | Admitting: Gastroenterology

## 2021-08-19 DIAGNOSIS — K51011 Ulcerative (chronic) pancolitis with rectal bleeding: Secondary | ICD-10-CM

## 2021-08-19 NOTE — Telephone Encounter (Signed)
Called patient and advised him to increase sulfasalazine dose to 2 g twice daily.  Rinvoq has significant out-of-pocket expense, he has to spend $12,000 for a month supply.  Discussed with patient benefits and risks with biologic therapy, Remicade, he is willing to proceed.  I will send referral to infusion center to start Remicade.  Thank you

## 2021-08-20 ENCOUNTER — Telehealth: Payer: Self-pay | Admitting: Pharmacy Technician

## 2021-08-20 ENCOUNTER — Telehealth: Payer: Self-pay | Admitting: Gastroenterology

## 2021-08-20 NOTE — Telephone Encounter (Signed)
Both are effective in treating UC, given he is approved for Remicade and doesn't have high out of pocket expense, probably best to go with it.  Rinvoq is more convenient as its oral medication.

## 2021-08-20 NOTE — Telephone Encounter (Signed)
Inbound call from patient requesting a call back from the nurse to discuss medication for Ulcerative Colitis. Please advise.

## 2021-08-20 NOTE — Telephone Encounter (Signed)
Left message for pt to call back.  Pt states he has done some research and Rinvoq would cost him 12,000/mth. He has figured out how he can get it for 3100.00 for a 90 day supply. Discussed with him there is documentation that he can get Remicade at the infusion center.   Pt wants to know which drug is the safest and best drug for him to "get well." He is willing to pay 3100.00 if Rinvoq would be better for him. Please advise.

## 2021-08-20 NOTE — Telephone Encounter (Signed)
Dr. Silverio Decamp, Joseph Booker note:  Auth Submission: no auth needed Payer: MEDICARE A/B & AARP Medication & CPT/J Code(s) submitted: Remicade (Infliximab) J1745 Route of submission (phone, fax, portal): PHONE Auth type: Buy/Bill Units/visits requested:  Reference number: [id number] Approval from: 08/20/21 to 12/20/21   Patient will be scheduled as soon as possible.

## 2021-08-20 NOTE — Telephone Encounter (Signed)
You're welcome. Joseph Booker

## 2021-08-20 NOTE — Telephone Encounter (Signed)
Thank you :)

## 2021-08-21 ENCOUNTER — Other Ambulatory Visit: Payer: Self-pay | Admitting: Internal Medicine

## 2021-08-21 ENCOUNTER — Encounter: Payer: Self-pay | Admitting: Internal Medicine

## 2021-08-21 MED ORDER — ESCITALOPRAM OXALATE 10 MG PO TABS: 10.0000 mg | ORAL_TABLET | Freq: Every day | ORAL | 1 refills | Status: DC

## 2021-08-23 ENCOUNTER — Encounter: Payer: Self-pay | Admitting: Family Medicine

## 2021-08-23 ENCOUNTER — Other Ambulatory Visit: Payer: Medicare Other

## 2021-08-23 ENCOUNTER — Telehealth (INDEPENDENT_AMBULATORY_CARE_PROVIDER_SITE_OTHER): Payer: Medicare Other | Admitting: Family Medicine

## 2021-08-23 VITALS — HR 63 | Ht 68.0 in | Wt 148.5 lb

## 2021-08-23 DIAGNOSIS — Z79899 Other long term (current) drug therapy: Secondary | ICD-10-CM

## 2021-08-23 DIAGNOSIS — Z7185 Encounter for immunization safety counseling: Secondary | ICD-10-CM | POA: Diagnosis not present

## 2021-08-23 DIAGNOSIS — I2510 Atherosclerotic heart disease of native coronary artery without angina pectoris: Secondary | ICD-10-CM | POA: Diagnosis not present

## 2021-08-23 NOTE — Progress Notes (Signed)
Patient ID: Joseph Booker, male   DOB: 03/12/1950, 71 y.o.   MRN: [id number]   Virtual Visit via Video Note  I connected with Joseph Booker on 08/23/21 at  9:00 AM EDT by a video enabled telemedicine application and verified that I am speaking with the correct person using two identifiers.  Location patient: home Location provider:work or home office Persons participating in the virtual visit: patient, provider  I discussed the limitations of evaluation and management by telemedicine and the availability of in person appointments. The patient expressed understanding and agreed to proceed.   HPI:  Joseph Booker called about a couple items.  His primary is Joseph Booker.  He has ulcerative colitis and states for about 6 weeks he has been struggling to get infusion.  He was called just recently that Remicade infusion has been scheduled by his GI.  In light of the fact that he will be getting Remicade he had questions regarding vaccinations.  He wished for [place] to go over those with him.  He does plan to get flu vaccine this fall.  He has had prior pneumonia vaccine but this was years ago and not since turning 89.  He also is due for tetanus at this time.  He had previous hepatitis A vaccine.  Has reportedly had both Zostavax and Shingrix.   ROS: See pertinent positives and negatives per HPI.  Past Medical History:  Diagnosis Date   Allergic rhinitis    Allergy    Anxiety state, unspecified    Ascending aortic aneurysm ([place]) 05/15/2020   Bradycardia 05/03/2021   CAD (coronary artery disease)    [name] / nuclear 2005, excellent tolerance no scar or ischemia, EF 51% / nuclear, February, 2012, no scar or ischemia / catheterization March 26, 2010.. 10% in-stent restenosis, minimal other nonobstructive coronary disease, ejection fraction 55%., excellent result; ETT 9/13: ex 13:14, no CP, no ischemic ECG changes   Cancer (HCC)    basal cell carcinoma x2 on face   Cataract    surgery to both eyes    Elevated blood pressure reading 05/15/2020   Glaucoma, left eye    left eye   History of exercise intolerance 05/23/2013   cardiopulmonary exercise test-- normal functional capacity when compared to matched sedentary norms, borderline ventilatory limitation with the patient's exercise tidal volume reaching 90% of the measured inspiratory capacity ( pt reports no dyspnea), did not appear to be circulatory limitation, normal CPX test   Hyperlipidemia    Nocturia    Osteopenia    Pure hypercholesterolemia 06/12/2009   Thoracic ascending aortic aneurysm [facility])    followed by dr Joseph Booker---  last CT 12-29-2016 ,  4.5cm   Ulcerative colitis, universal ([place])    GI-- dr Silverio Booker--  dx 1990s  (per note mild Mayo Score 1)   Wears glasses     Past Surgical History:  Procedure Laterality Date   CARDIAC CATHETERIZATION  03/26/2010    dr Joseph Booker   single vessel CAD w/ patent mLAD stent w/ 10% in-stent restenosis w/ mild to moderate disease in the remainder LAD w/ no lesions that appearedto be flow-limitinf/  normal LVFSF, ef 55%   CARDIOVASCULAR STRESS TEST  06-30-2016   dr Joseph Booker   Low risk nuclear study w/ no evidence ishcemia/  normal LV function and wall motion,  nuclear stress ef 55%   COLONOSCOPY  last one 10-27-2016   CORONARY ANGIOPLASTY WITH STENT PLACEMENT  07-25-2002   dr Joseph Booker   PTCA w/  DES  x1 to Joseph Booker Left 2006   INGUINAL HERNIA REPAIR Right 07/03/2017   Procedure: OPEN REPAIR OF RIGHT INGUINAL HERNIA WITH MESH;  Surgeon: Joseph Booker , MD;  Location: [place];  Service: General;  Laterality: Right;   INSERTION OF MESH Right 07/03/2017   Procedure: INSERTION OF MESH;  Surgeon: Joseph Booker , MD;  Location: [place];  Service: General;  Laterality: Right;   LEFT HEART CATH AND CORONARY ANGIOGRAPHY N/A 09/11/2020   Procedure: LEFT HEART CATH AND CORONARY ANGIOGRAPHY;  Surgeon: Joseph Booze, MD;   Location: [place] [facility];  Service: Cardiovascular;  Laterality: N/A;   SEPTOPLASTY  1987   w/ RHINOPLASTY   THORACIC AORTOGRAM N/A 09/11/2020   Procedure: THORACIC AORTOGRAM;  Surgeon: Joseph Booze, MD;  Location: [place] [facility];  Service: Cardiovascular;  Laterality: N/A;   TONSILLECTOMY  1959   TRANSTHORACIC ECHOCARDIOGRAM  06-30-2016     dr Joseph Booker 55-60%/  trivial AR/ mild dilated ascending aorta, 60m/  trivial MR/  mild PR and TR     Family History  Problem Relation Age of Onset   Diabetes Mother    Heart disease Mother    Lung disease Mother        ILD   Rheum arthritis Mother    CAD Mother    Heart disease Father 523      MI   Heart attack Father    Asthma Sister    Colon cancer Neg Hx    Esophageal cancer Neg Hx    Pancreatic cancer Neg Hx    Kidney disease Neg Hx    Liver disease Neg Hx    Rectal cancer Neg Hx    Stomach cancer Neg Hx     SOCIAL HX: Non-smoker   Current Outpatient Medications:    ondansetron (ZOFRAN-ODT) 4 MG disintegrating tablet, Take 1 tablet (4 mg total) by mouth daily as needed for up to 10 doses for nausea or vomiting. (Patient not taking: Reported on 08/09/2021), Disp: 10 tablet, Rfl: 0   aspirin 81 MG tablet, Take 81 mg by mouth daily., Disp: , Rfl:    clopidogrel (PLAVIX) 75 MG tablet, Take 1 tablet by mouth once daily, Disp: 90 tablet, Rfl: 3   Coenzyme Q10 (CO Q-10) 300 MG CAPS, Take 300 mg by mouth every morning., Disp: , Rfl:    dorzolamide (TRUSOPT) 2 % ophthalmic solution, 1 drop 2 (two) times daily., Disp: , Rfl:    escitalopram (LEXAPRO) 10 MG tablet, Take 1 tablet (10 mg total) by mouth daily., Disp: 90 tablet, Rfl: 1   latanoprost (XALATAN) 0.005 % ophthalmic solution, Place 1 drop into the left eye daily., Disp: , Rfl:    mesalamine (CANASA) 1000 MG suppository, Place 1 suppository (1,000 mg total) rectally at bedtime., Disp: 30 suppository, Rfl: 0   REPATHA SURECLICK 1845MG/ML SOAJ, INJECT 140 MG INTO THE  SKIN EVERY 14 DAYS, Disp: 2 mL, Rfl: 11   sulfaSALAzine (AZULFIDINE) 500 MG tablet, Take 2 tablets (1,000 mg total) by mouth 2 (two) times daily., Disp: 120 tablet, Rfl: 3   timolol (TIMOPTIC) 0.5 % ophthalmic solution, Place 1 drop into both eyes 2 (two) times daily. , Disp: , Rfl:   EXAM:  VITALS per patient if applicable:  GENERAL: alert, oriented, appears well and in no acute distress  HEENT: atraumatic, conjunttiva clear, no obvious abnormalities on inspection of external nose and ears  NECK: normal movements of the head and neck  LUNGS: on inspection no signs of respiratory distress, breathing rate appears normal, no obvious gross SOB, gasping or wheezing  CV: no obvious cyanosis  MS: moves all visible extremities without noticeable abnormality  PSYCH/NEURO: pleasant and cooperative, no obvious depression or anxiety, speech and thought processing grossly intact  ASSESSMENT AND PLAN:  Discussed the following assessment and plan:  Immunization counseling.  Patient had several questions regarding immunizations in view of him getting ready to start Remicade for his ulcerative colitis.  We discussed the following:  -Recommend Prevnar 20 and he will schedule through his primary office -Recommend flu vaccine this fall -Recommend Tdap booster which he will consider at pharmacy -Shingrix and hep A previously given -He did have recent QuantiFERON gold assay which was negative.  Will add hepatitis B surface antigen for future lab     I discussed the assessment and treatment plan with the patient. The patient was provided an opportunity to ask questions and all were answered. The patient agreed with the plan and demonstrated an understanding of the instructions.   The patient was advised to call back or seek an in-person evaluation if the symptoms worsen or if the condition fails to improve as anticipated.     Carolann Littler, MD

## 2021-08-23 NOTE — Telephone Encounter (Signed)
Patient was unable to come to the phone. Information given to spouse to relay to the patient.

## 2021-08-24 LAB — HEPATITIS B SURFACE ANTIGEN: Hepatitis B Surface Ag: NONREACTIVE

## 2021-08-25 ENCOUNTER — Encounter: Payer: Self-pay | Admitting: Internal Medicine

## 2021-08-30 ENCOUNTER — Ambulatory Visit (INDEPENDENT_AMBULATORY_CARE_PROVIDER_SITE_OTHER): Payer: Medicare Other

## 2021-08-30 VITALS — BP 131/85 | HR 54 | Temp 98.5°F | Resp 18 | Ht 68.5 in | Wt 147.8 lb

## 2021-08-30 DIAGNOSIS — K51011 Ulcerative (chronic) pancolitis with rectal bleeding: Secondary | ICD-10-CM

## 2021-08-30 MED ORDER — DIPHENHYDRAMINE HCL 25 MG PO CAPS
25.0000 mg | ORAL_CAPSULE | Freq: Once | ORAL | Status: AC
  Administered 2021-08-30: 25 mg via ORAL
  Filled 2021-08-30: qty 1

## 2021-08-30 MED ORDER — ACETAMINOPHEN 325 MG PO TABS
650.0000 mg | ORAL_TABLET | Freq: Once | ORAL | Status: AC
  Administered 2021-08-30: 650 mg via ORAL
  Filled 2021-08-30: qty 2

## 2021-08-30 MED ORDER — METHYLPREDNISOLONE SODIUM SUCC 40 MG IJ SOLR
40.0000 mg | Freq: Once | INTRAMUSCULAR | Status: AC
  Administered 2021-08-30: 40 mg via INTRAVENOUS
  Filled 2021-08-30: qty 1

## 2021-08-30 MED ORDER — SODIUM CHLORIDE 0.9 % IV SOLN
5.0000 mg/kg | Freq: Once | INTRAVENOUS | Status: AC
  Administered 2021-08-30: 300 mg via INTRAVENOUS
  Filled 2021-08-30: qty 30

## 2021-08-30 NOTE — Progress Notes (Signed)
Diagnosis: Ulcerative Pancolitis  Provider:  Marshell Garfinkel MD  Procedure: Infusion  IV Type: Peripheral, IV Location: L Antecubital  Remicade (Infliximab), Dose: 300 mg  Infusion Start Time: 7124  Infusion Stop Time: 1216  Post Infusion IV Care: Observation period completed and Peripheral IV Discontinued  Discharge: Condition: Good, Destination: Home . AVS provided to patient.   Performed by:  Arnoldo Morale, RN

## 2021-09-01 ENCOUNTER — Encounter: Payer: Self-pay | Admitting: Physician Assistant

## 2021-09-01 ENCOUNTER — Telehealth: Payer: Self-pay | Admitting: *Deleted

## 2021-09-01 ENCOUNTER — Ambulatory Visit (INDEPENDENT_AMBULATORY_CARE_PROVIDER_SITE_OTHER): Payer: Medicare Other | Admitting: Physician Assistant

## 2021-09-01 VITALS — BP 126/72 | HR 55 | Ht 68.0 in | Wt 148.0 lb

## 2021-09-01 DIAGNOSIS — K51011 Ulcerative (chronic) pancolitis with rectal bleeding: Secondary | ICD-10-CM

## 2021-09-01 DIAGNOSIS — I2510 Atherosclerotic heart disease of native coronary artery without angina pectoris: Secondary | ICD-10-CM

## 2021-09-01 NOTE — Telephone Encounter (Signed)
Pt agreeable to tele appt preop 09/24/21 @ 10 am. Med rec and consent are done.     Patient Consent for Virtual Visit        Joseph Booker has provided verbal consent on 09/01/2021 for a virtual visit (video or telephone).   CONSENT FOR VIRTUAL VISIT FOR:  Joseph Booker  By participating in this virtual visit I agree to the following:  I hereby voluntarily request, consent and authorize [facility] and its employed or contracted physicians, physician assistants, nurse practitioners or other licensed health care professionals (the Practitioner), to provide me with telemedicine health care services (the "Services") as deemed necessary by the treating Practitioner. I acknowledge and consent to receive the Services by the Practitioner via telemedicine. I understand that the telemedicine visit will involve communicating with the Practitioner through live audiovisual communication technology and the disclosure of certain medical information by electronic transmission. I acknowledge that I have been given the opportunity to request an in-person assessment or other available alternative prior to the telemedicine visit and am voluntarily participating in the telemedicine visit.  I understand that I have the right to withhold or withdraw my consent to the use of telemedicine in the course of my care at any time, without affecting my right to future care or treatment, and that the Practitioner or I may terminate the telemedicine visit at any time. I understand that I have the right to inspect all information obtained and/or recorded in the course of the telemedicine visit and may receive copies of available information for a reasonable fee.  I understand that some of the potential risks of receiving the Services via telemedicine include:  Delay or interruption in medical evaluation due to technological equipment failure or disruption; Information transmitted may not be sufficient (e.g. poor resolution of images)  to allow for appropriate medical decision making by the Practitioner; and/or  In rare instances, security protocols could fail, causing a breach of personal health information.  Furthermore, I acknowledge that it is my responsibility to provide information about my medical history, conditions and care that is complete and accurate to the best of my ability. I acknowledge that Practitioner's advice, recommendations, and/or decision may be based on factors not within their control, such as incomplete or inaccurate data provided by me or distortions of diagnostic images or specimens that may result from electronic transmissions. I understand that the practice of medicine is not an exact science and that Practitioner makes no warranties or guarantees regarding treatment outcomes. I acknowledge that a copy of this consent can be made available to me via my patient portal ([facility]), or I can request a printed copy by calling the office of [facility].    I understand that my insurance will be billed for this visit.   I have read or had this consent read to me. I understand the contents of this consent, which adequately explains the benefits and risks of the Services being provided via telemedicine.  I have been provided ample opportunity to ask questions regarding this consent and the Services and have had my questions answered to my satisfaction. I give my informed consent for the services to be provided through the use of telemedicine in my medical care

## 2021-09-01 NOTE — Telephone Encounter (Signed)
Pt agreeable to tele appt preop 09/24/21 @ 10 am. Med rec and consent are done

## 2021-09-01 NOTE — Patient Instructions (Signed)
You have been scheduled for a flexible sigmoidoscopy. Please follow the written instructions given to you at your visit today. If you use inhalers (even only as needed), please bring them with you on the day of your procedure.  You will be contacted by our office prior to your procedure for directions on holding your Plavix.  If you do not hear from our office 1 week prior to your scheduled procedure, please call [phone] to discuss.   _______________________________________________________  If you are age 61 or older, your body mass index should be between 23-30. Your Body mass index is 22.5 kg/m. If this is out of the aforementioned range listed, please consider follow up with your Primary Care Provider.  If you are age 51 or younger, your body mass index should be between 19-25. Your Body mass index is 22.5 kg/m. If this is out of the aformentioned range listed, please consider follow up with your Primary Care Provider.   ________________________________________________________  The [place] [facility] [facility] providers would like to encourage you to use [facility] to communicate with providers for non-urgent requests or questions.  Due to long hold times on the telephone, sending your provider a message by [facility] may be a faster and more efficient way to get a response.  Please allow 48 business hours for a response.  Please remember that this is for non-urgent requests.  _______________________________________________________

## 2021-09-01 NOTE — Telephone Encounter (Signed)
   Name: Joseph Booker  DOB: [birth year]  MRN: [id number]  Primary Cardiologist: Skeet Latch, MD   Preoperative team, please contact this patient and set up a phone call appointment for further preoperative risk assessment. Please obtain consent and complete medication review. Thank you for your help.  I confirm that guidance regarding antiplatelet and oral anticoagulation therapy has been completed and, if necessary, noted below.   Per office protocol, if patient is without any new symptoms or concerns at the time of his virtual visit, he may hold Plavix for 5 days prior to procedure. Please resume Plavix as soon as possible postprocedure, at the discretion of the surgeon.   Lenna Sciara, NP 09/01/2021, 4:18 PM [place]

## 2021-09-01 NOTE — Progress Notes (Signed)
Chief Complaint: Follow up ulcerative colitis  HPI:    [name] is a 71 year old Caucasian male with a past medical history as listed below including CAD and ulcerative pancolitis, known to Dr. Silverio Decamp, who was referred to me by Binnie Rail, MD for follow-up of ulcerative colitis.    04/17/2020 colonoscopy with inflammation characterized by altered vascularity, congestion, erythema and scarring and medium patches surrounded by normal mucosa in the rectum, descending colon, splenic flexure, transverse colon, ascending colon and cecum.  This was mild in severity when compared to previous examinations.  The findings had worsened.  Nonbleeding internal hemorrhoids.    08/09/2021 patient seen by Dr. Silverio Decamp for history of chronic ulcerative colitis with flare.  Apparently had been in clinical remission on daily Mesalamine from a 17 years but had symptoms of acute UC flare for 2 weeks and had not noticed any improvement on Prednisone 40 mg daily.  Previous GI stool pathogen panel and C. difficile negative.  Described frequent urge to have bowel movements with tenesmus, passing mucus with small amount of blood 15-20 times a day and 3-4 bowel movements daily.  Pathology showed mildly active chronic colitis consistent with patient's history of UC.  He was tapered off of Prednisone and started on Remicade.  Also instructed to use Canasa suppository at bedtime and Hydrocortisone suppository in the morning for 2 to 4 weeks to improve tenesmus and rectal bleeding.  Discussed possible flex sig pending refractory symptoms.    08/17/2021 patient's Sulfasalazine was increased to 2 g twice a day.  Also patient called in around that time requesting anxiety management which was CCed to his PCP.    08/30/2021 patient had infusion of Remicade.    Today, patient tells me he is here for follow-up.  He just had his first Remicade infusion 2 days ago and tells me he feels like all of his symptoms are worse.  Describes 10-12 bowel  movements a day which are really no stool and are just more blood and mucus.  Tells me the blood seems to have increased over the past week or so.  He continues on Sulfasalazine typically 4-6 tabs a day.  He tried increasing to 8 and even to 12 but that gave him a splitting headache so he backed back down.  Tells me he has not tried the Canasa suppositories because in the past he had 1 Rowasa enema and became violently ill.  He had tried the Hydrocortisone suppositories in the morning for 10 days but did not feel like it made a difference.  He is asking now about flex sigmoidoscopy.    Denies fever or chills.  Past Medical History:  Diagnosis Date   Allergic rhinitis    Allergy    Anxiety state, unspecified    Ascending aortic aneurysm ([place]) 05/15/2020   Bradycardia 05/03/2021   CAD (coronary artery disease)    [name] / nuclear 2005, excellent tolerance no scar or ischemia, EF 51% / nuclear, February, 2012, no scar or ischemia / catheterization March 26, 2010.. 10% in-stent restenosis, minimal other nonobstructive coronary disease, ejection fraction 55%., excellent result; ETT 9/13: ex 13:14, no CP, no ischemic ECG changes   Cancer (HCC)    basal cell carcinoma x2 on face   Cataract    surgery to both eyes   Elevated blood pressure reading 05/15/2020   Glaucoma, left eye    left eye   History of exercise intolerance 05/23/2013   cardiopulmonary exercise test-- normal functional capacity  when compared to matched sedentary norms, borderline ventilatory limitation with the patient's exercise tidal volume reaching 90% of the measured inspiratory capacity ( pt reports no dyspnea), did not appear to be circulatory limitation, normal CPX test   Hyperlipidemia    Nocturia    Osteopenia    Pure hypercholesterolemia 06/12/2009   Thoracic ascending aortic aneurysm [facility])    followed by dr Skeet Latch---  last CT 12-29-2016 ,  4.5cm   Ulcerative colitis, universal ([place])    GI-- dr  Silverio Decamp--  dx 1990s  (per note mild Mayo Score 1)   Wears glasses     Past Surgical History:  Procedure Laterality Date   CARDIAC CATHETERIZATION  03/26/2010    dr Dian Queen   single vessel CAD w/ patent mLAD stent w/ 10% in-stent restenosis w/ mild to moderate disease in the remainder LAD w/ no lesions that appearedto be flow-limitinf/  normal LVFSF, ef 55%   CARDIOVASCULAR STRESS TEST  06-30-2016   [name]   Low risk nuclear study w/ no evidence ishcemia/  normal LV function and wall motion,  nuclear stress ef 55%   COLONOSCOPY  last one 10-27-2016   CORONARY ANGIOPLASTY WITH STENT PLACEMENT  07-25-2002   [name]   PTCA w/  DES x1 to [place] Left 2006   INGUINAL HERNIA REPAIR Right 07/03/2017   Procedure: OPEN REPAIR OF RIGHT INGUINAL HERNIA WITH MESH;  Surgeon: Kinsinger, Arta Bruce, MD;  Location: [place];  Service: General;  Laterality: Right;   INSERTION OF MESH Right 07/03/2017   Procedure: INSERTION OF MESH;  Surgeon: Kieth Brightly Arta Bruce, MD;  Location: [facility];  Service: General;  Laterality: Right;   LEFT HEART CATH AND CORONARY ANGIOGRAPHY N/A 09/11/2020   Procedure: LEFT HEART CATH AND CORONARY ANGIOGRAPHY;  Surgeon: Jettie Booze, MD;  Location: [facility];  Service: Cardiovascular;  Laterality: N/A;   SEPTOPLASTY  1987   w/ RHINOPLASTY   THORACIC AORTOGRAM N/A 09/11/2020   Procedure: THORACIC AORTOGRAM;  Surgeon: Jettie Booze, MD;  Location: [facility];  Service: Cardiovascular;  Laterality: N/A;   TONSILLECTOMY  1959   TRANSTHORACIC ECHOCARDIOGRAM  06-30-2016     dr Alison Murray 55-60%/  trivial AR/ mild dilated ascending aorta, 68m/  trivial MR/  mild PR and TR     Current Outpatient Medications  Medication Sig Dispense Refill   aspirin 81 MG tablet Take 81 mg by mouth daily.     clopidogrel (PLAVIX) 75 MG tablet Take 1 tablet by mouth once daily 90 tablet 3   Coenzyme Q10 (CO  Q-10) 300 MG CAPS Take 300 mg by mouth every morning.     latanoprost (XALATAN) 0.005 % ophthalmic solution Place 1 drop into the left eye daily.     ondansetron (ZOFRAN-ODT) 4 MG disintegrating tablet Take 1 tablet (4 mg total) by mouth daily as needed for up to 10 doses for nausea or vomiting. (Patient not taking: Reported on 08/09/2021) 10 tablet 0   REPATHA SURECLICK 1767MG/ML SOAJ INJECT 140 MG INTO THE SKIN EVERY 14 DAYS 2 mL 11   sulfaSALAzine (AZULFIDINE) 500 MG tablet Take 2 tablets (1,000 mg total) by mouth 2 (two) times daily. 120 tablet 3   timolol (TIMOPTIC) 0.5 % ophthalmic solution Place 1 drop into both eyes 2 (two) times daily.      dorzolamide (TRUSOPT) 2 % ophthalmic solution 1 drop 2 (two) times daily. (  Patient not taking: Reported on 09/01/2021)     escitalopram (LEXAPRO) 10 MG tablet Take 1 tablet (10 mg total) by mouth daily. (Patient not taking: Reported on 09/01/2021) 90 tablet 1   mesalamine (CANASA) 1000 MG suppository Place 1 suppository (1,000 mg total) rectally at bedtime. (Patient not taking: Reported on 09/01/2021) 30 suppository 0   No current facility-administered medications for this visit.    Allergies as of 09/01/2021 - Review Complete 09/01/2021  Allergen Reaction Noted   Atorvastatin Other (See Comments) 04/04/2019    Family History  Problem Relation Age of Onset   Diabetes Mother    Heart disease Mother    Lung disease Mother        ILD   Rheum arthritis Mother    CAD Mother    Heart disease Father 24       MI   Heart attack Father    Asthma Sister    Colon cancer Neg Hx    Esophageal cancer Neg Hx    Pancreatic cancer Neg Hx    Kidney disease Neg Hx    Liver disease Neg Hx    Rectal cancer Neg Hx    Stomach cancer Neg Hx     Social History   Socioeconomic History   Marital status: Married    Spouse name: Not on file   Number of children: 1   Years of education: Not on file   Highest education level: Not on file  Occupational History    Occupation: retired    Fish farm manager: RETIRED  Tobacco Use   Smoking status: Never   Smokeless tobacco: Never   Tobacco comments:    Married, 1 stepson, 5 g-kids. Retired Emergency planning/management officer, now Environmental health practitioner Use: Never used  Substance and Sexual Activity   Alcohol use: Yes    Comment: occasional wine   Drug use: No   Sexual activity: Yes    Partners: Female  Other Topics Concern   Not on file  Social History Narrative   Exercising regularly: walks at least 5 miles a day, 6 days a week   Social Determinants of Radio broadcast assistant Strain: Not on file  Food Insecurity: Not on file  Transportation Needs: Not on file  Physical Activity: Not on file  Stress: Not on file  Social Connections: Not on file  Intimate Partner Violence: Not on file    Review of Systems:    Constitutional: No weight loss, fever or chills Cardiovascular: No chest pain Respiratory: No SOB  Gastrointestinal: See HPI and otherwise negative   Physical Exam:  Vital signs: BP 126/72   Pulse (!) 55   Ht 5' 8"  (1.727 m)   Wt 148 lb (67.1 kg)   SpO2 99%   BMI 22.50 kg/m    Constitutional:   Pleasant Caucasian male appears to be in NAD, Well developed, Well nourished, alert and cooperative Respiratory: Respirations even and unlabored. Lungs clear to auscultation bilaterally.   No wheezes, crackles, or rhonchi.  Cardiovascular: Normal S1, S2. No MRG. Regular rate and rhythm. No peripheral edema, cyanosis or pallor.  Gastrointestinal:  Soft, nondistended, nontender. No rebound or guarding.  Increased bowel sounds all 4 quadrants. No appreciable masses or hepatomegaly. Rectal:  Not performed.  Psychiatric:Demonstrates good judgement and reason without abnormal affect or behaviors.  RELEVANT LABS AND IMAGING: CBC    Component Value Date/Time   WBC 11.8 (H) 08/09/2021 1205   RBC 5.01 08/09/2021 1205  HGB 16.5 08/09/2021 1205   HGB 15.9 12/24/2020 0943   HGB 14.7 04/27/2005 1006    HCT 48.9 08/09/2021 1205   HCT 45.9 12/24/2020 0943   HCT 42.8 04/27/2005 1006   PLT 167.0 08/09/2021 1205   PLT 165 12/24/2020 0943   MCV 97.6 08/09/2021 1205   MCV 96 12/24/2020 0943   MCV 106.3 (H) 04/27/2005 1006   MCH 33.3 (H) 12/24/2020 0943   MCH 32.0 11/14/2017 0315   MCHC 33.7 08/09/2021 1205   RDW 13.5 08/09/2021 1205   RDW 12.2 12/24/2020 0943   RDW 16.6 (H) 04/27/2005 1006   LYMPHSABS 1.3 08/09/2021 1205   LYMPHSABS 1.2 04/27/2005 1006   MONOABS 0.2 08/09/2021 1205   MONOABS 0.5 04/27/2005 1006   EOSABS 0.0 08/09/2021 1205   EOSABS 0.2 04/27/2005 1006   BASOSABS 0.0 08/09/2021 1205   BASOSABS 0.1 04/27/2005 1006    CMP     Component Value Date/Time   NA 139 08/09/2021 1205   NA 141 12/24/2020 0943   K 4.1 08/09/2021 1205   CL 100 08/09/2021 1205   CO2 30 08/09/2021 1205   GLUCOSE 119 (H) 08/09/2021 1205   BUN 24 (H) 08/09/2021 1205   BUN 23 12/24/2020 0943   CREATININE 1.08 08/09/2021 1205   CALCIUM 9.8 08/09/2021 1205   PROT 7.1 08/09/2021 1205   PROT 6.9 12/24/2020 0943   ALBUMIN 4.3 08/09/2021 1205   ALBUMIN 4.3 12/24/2020 0943   AST 19 08/09/2021 1205   ALT 15 08/09/2021 1205   ALKPHOS 43 08/09/2021 1205   BILITOT 0.7 08/09/2021 1205   BILITOT 0.7 12/24/2020 0943   GFRNONAA 58 (L) 12/30/2019 0908   GFRAA 67 12/30/2019 0908    Assessment: 1.  Ulcerative colitis: With flare including diarrhea and hematochezia, previously controlled on Mesalamine for 17 years, flare unresponsive to Prednisone 40 mg, stool studies negative, patient recently switched to Remicade with his first infusion 2 days ago and patient feels symptoms are worse over the past week  Plan: 1.  Discussed the patient do not feel like the Remicade has had a chance to work for him.  I would like to wait on adjusting anything until we see how this goes.  If there is no change then he would require a flex sig and possibly other management, but we are not far enough out to know yet. 2.   Went ahead and set patient up for a flex sigmoidoscopy in 1 month.  This is tentative and patient understands that I will let Dr. Silverio Decamp review his case to decide if this is necessary or not.  He would really prefer not to have one and go through the bowel prep if he does not need to.  Tells me he cannot handle any liquid bowel prep and would need Su tabs.  If we did a flex sig likely he could just have a couple of enemas.  He also asked though that if we are going to do a flex sig maybe we should just do the colonoscopy.  I will let Dr. Silverio Decamp review and decide on his course going forward. 3.  Patient to follow in clinic per recommendations from Dr. Silverio Decamp.  Ellouise Newer, PA-C [facility] 09/01/2021, 1:28 PM  Cc: Binnie Rail, MD

## 2021-09-01 NOTE — Telephone Encounter (Signed)
[facility] Pre-operative Risk Assessment     Request for surgical clearance:     Endoscopy Procedure  What type of surgery is being performed?     Flex sig  When is this surgery scheduled?     Monday 10/04/21  What type of clearance is required ?   Pharmacy  Are there any medications that need to be held prior to surgery and how long? Plavix 5 days  Practice name and name of physician performing surgery?      [facility]  What is your office phone and fax number?      Phone- [phone]  [phone]  Anesthesia type (None, local, MAC, general) ?       MAC

## 2021-09-03 DIAGNOSIS — Z23 Encounter for immunization: Secondary | ICD-10-CM | POA: Diagnosis not present

## 2021-09-06 ENCOUNTER — Other Ambulatory Visit: Payer: Medicare Other | Admitting: Gastroenterology

## 2021-09-08 ENCOUNTER — Telehealth: Payer: Self-pay

## 2021-09-08 NOTE — Telephone Encounter (Addendum)
-----   Message from Levin Erp, [place] sent at 09/07/2021  4:02 PM EDT ----- Regarding: FW: flex? colon?  ----- Message ----- From: Mauri Pole, MD Sent: 09/07/2021   4:01 PM EDT To: Levin Erp, PA Subject: RE: flex? colon?                               Lets get him in with me for office visit next available in 1-2 months. We can do CRP and fecal calprotectin in 4 weeks as it will provide useful information and can hold off the flex sig and consider colonoscopy if needed based on his symptoms and I will discuss further at follow up visit. Thanks ----- Message ----- From: Levin Erp, PA Sent: 09/01/2021   2:34 PM EDT To: Mauri Pole, MD Subject: flex? colon?                                   Patient was scheduled to see me today, it has only been 2 days since his first Remicade infusion and obviously he had not seen a change in symptoms.  He told me that you recommended a flex sigmoidoscopy, I discussed that this is likely only the if symptoms did not get any better.  I went ahead and scheduled him for tentative flex sig with you in 4 weeks.  He asked a lot of questions though about whether or not this should be a colonoscopy and also told me that he cannot tolerate any prep.  Can you let me know how you would like to proceed and I will have my nurse call him with the appropriate instructions if you are wanting to take a look.  We may just need to wait a few weeks to see what happens.  Thanks, JL L

## 2021-09-08 NOTE — Telephone Encounter (Signed)
Pt returned call. We reviewed Dr. Woodward Ku recommendations. Pt is aware that we have cancelled his flex sig appt and he has been advised to keep f/u on 9/21 as scheduled. Pt is aware that he will need labs and a stool kit when he comes in for his follow up appt. Necessity of colonoscopy will be determined based on patient's symptoms. Pt is aware that all information will be discussed further at his upcoming appointment. Pt verbalized understanding and had no concerns at the end of the call.   [facility] flex sig cancelled.

## 2021-09-08 NOTE — Telephone Encounter (Signed)
Lm on home vm for patient to return call.

## 2021-09-09 ENCOUNTER — Ambulatory Visit: Payer: Medicare Other | Admitting: Internal Medicine

## 2021-09-13 ENCOUNTER — Ambulatory Visit: Payer: Medicare Other | Admitting: Internal Medicine

## 2021-09-13 ENCOUNTER — Ambulatory Visit (INDEPENDENT_AMBULATORY_CARE_PROVIDER_SITE_OTHER): Payer: Medicare Other | Admitting: *Deleted

## 2021-09-13 VITALS — BP 130/80 | HR 48 | Temp 97.9°F | Resp 16 | Ht 68.0 in | Wt 148.4 lb

## 2021-09-13 DIAGNOSIS — K51011 Ulcerative (chronic) pancolitis with rectal bleeding: Secondary | ICD-10-CM

## 2021-09-13 MED ORDER — ACETAMINOPHEN 325 MG PO TABS
650.0000 mg | ORAL_TABLET | Freq: Once | ORAL | Status: AC
  Administered 2021-09-13: 650 mg via ORAL
  Filled 2021-09-13: qty 2

## 2021-09-13 MED ORDER — METHYLPREDNISOLONE SODIUM SUCC 40 MG IJ SOLR
40.0000 mg | Freq: Once | INTRAMUSCULAR | Status: AC
  Administered 2021-09-13: 40 mg via INTRAVENOUS
  Filled 2021-09-13: qty 1

## 2021-09-13 MED ORDER — SODIUM CHLORIDE 0.9 % IV SOLN
5.0000 mg/kg | Freq: Once | INTRAVENOUS | Status: AC
  Administered 2021-09-13: 300 mg via INTRAVENOUS
  Filled 2021-09-13: qty 30

## 2021-09-13 MED ORDER — DIPHENHYDRAMINE HCL 25 MG PO CAPS
25.0000 mg | ORAL_CAPSULE | Freq: Once | ORAL | Status: AC
  Administered 2021-09-13: 25 mg via ORAL
  Filled 2021-09-13: qty 1

## 2021-09-13 NOTE — Progress Notes (Signed)
Diagnosis: Crohn's Disease  Provider:  Marshell Garfinkel MD  Procedure: Infusion  IV Type: Peripheral, IV Location: R Antecubital  Remicade (Infliximab), Dose: 300 mg  Infusion Start Time: 1000 am  Infusion Stop Time: 1220 pm  Post Infusion IV Care: Observation period completed and Peripheral IV Discontinued  Discharge: Condition: Good, Destination: Home . AVS provided to patient.   Performed by:  Oren Beckmann, RN

## 2021-09-16 ENCOUNTER — Other Ambulatory Visit (INDEPENDENT_AMBULATORY_CARE_PROVIDER_SITE_OTHER): Payer: Medicare Other

## 2021-09-16 ENCOUNTER — Encounter: Payer: Self-pay | Admitting: Gastroenterology

## 2021-09-16 ENCOUNTER — Other Ambulatory Visit: Payer: Self-pay

## 2021-09-16 ENCOUNTER — Encounter (HOSPITAL_BASED_OUTPATIENT_CLINIC_OR_DEPARTMENT_OTHER): Payer: Self-pay | Admitting: Cardiovascular Disease

## 2021-09-16 DIAGNOSIS — I25118 Atherosclerotic heart disease of native coronary artery with other forms of angina pectoris: Secondary | ICD-10-CM

## 2021-09-16 DIAGNOSIS — R1084 Generalized abdominal pain: Secondary | ICD-10-CM

## 2021-09-16 DIAGNOSIS — K51011 Ulcerative (chronic) pancolitis with rectal bleeding: Secondary | ICD-10-CM

## 2021-09-16 DIAGNOSIS — I700 Atherosclerosis of aorta: Secondary | ICD-10-CM

## 2021-09-16 LAB — COMPREHENSIVE METABOLIC PANEL
ALT: 12 U/L (ref 0–53)
AST: 22 U/L (ref 0–37)
Albumin: 4 g/dL (ref 3.5–5.2)
Alkaline Phosphatase: 44 U/L (ref 39–117)
BUN: 20 mg/dL (ref 6–23)
CO2: 31 mEq/L (ref 19–32)
Calcium: 9.5 mg/dL (ref 8.4–10.5)
Chloride: 103 mEq/L (ref 96–112)
Creatinine, Ser: 1.39 mg/dL (ref 0.40–1.50)
GFR: 51.75 mL/min — ABNORMAL LOW (ref >60.00)
Glucose, Bld: 96 mg/dL (ref 70–99)
Potassium: 3.6 mEq/L (ref 3.5–5.1)
Sodium: 140 mEq/L (ref 135–145)
Total Bilirubin: 0.9 mg/dL (ref 0.2–1.2)
Total Protein: 6.9 g/dL (ref 6.0–8.3)

## 2021-09-16 LAB — CBC WITH DIFFERENTIAL/PLATELET
Basophils Absolute: 0.1 10*3/uL (ref 0.0–0.1)
Basophils Relative: 0.9 % (ref 0.0–3.0)
Eosinophils Absolute: 0.3 10*3/uL (ref 0.0–0.7)
Eosinophils Relative: 4.1 % (ref 0.0–5.0)
HCT: 45.2 % (ref 39.0–52.0)
Hemoglobin: 15.3 g/dL (ref 13.0–17.0)
Lymphocytes Relative: 31.8 % (ref 12.0–46.0)
Lymphs Abs: 2.5 10*3/uL (ref 0.7–4.0)
MCHC: 33.8 g/dL (ref 30.0–36.0)
MCV: 96.9 fl (ref 78.0–100.0)
Monocytes Absolute: 0.9 10*3/uL (ref 0.1–1.0)
Monocytes Relative: 11.8 % (ref 3.0–12.0)
Neutro Abs: 4.1 10*3/uL (ref 1.4–7.7)
Neutrophils Relative %: 51.4 % (ref 43.0–77.0)
Platelets: 163 10*3/uL (ref 150.0–400.0)
RBC: 4.67 Mil/uL (ref 4.22–5.81)
RDW: 12.9 % (ref 11.5–15.5)
WBC: 7.9 10*3/uL (ref 4.0–10.5)

## 2021-09-16 LAB — HIGH SENSITIVITY CRP: CRP, High Sensitivity: 1.53 mg/L (ref 0.000–5.000)

## 2021-09-16 LAB — SEDIMENTATION RATE: Sed Rate: 8 mm/hr (ref 0–20)

## 2021-09-17 ENCOUNTER — Encounter: Payer: Self-pay | Admitting: Gastroenterology

## 2021-09-18 LAB — CMV (CYTOMEGALOVIRUS) DNA ULTRAQUANT, PCR
CMV DNA, QN PCR: NOT DETECTED log IU/mL
CMV DNA, QN Real Time PCR: NOT DETECTED IU/mL

## 2021-09-18 LAB — CMV ABS, IGG+IGM (CYTOMEGALOVIRUS)
CMV IgM: <30 AU/mL
Cytomegalovirus Ab-IgG: <0.6 U/mL

## 2021-09-18 LAB — EXTRA SPECIMEN

## 2021-09-21 ENCOUNTER — Other Ambulatory Visit: Payer: Self-pay

## 2021-09-21 ENCOUNTER — Other Ambulatory Visit: Payer: Medicare Other

## 2021-09-21 ENCOUNTER — Encounter (HOSPITAL_COMMUNITY): Payer: Self-pay

## 2021-09-21 ENCOUNTER — Telehealth: Payer: Self-pay | Admitting: *Deleted

## 2021-09-21 ENCOUNTER — Emergency Department (HOSPITAL_COMMUNITY)
Admission: EM | Admit: 2021-09-21 | Discharge: 2021-09-22 | Disposition: A | Discharge status: Home / Self Care | Payer: Medicare Other | Attending: Emergency Medicine | Admitting: Emergency Medicine

## 2021-09-21 ENCOUNTER — Telehealth: Payer: Self-pay | Admitting: Gastroenterology

## 2021-09-21 DIAGNOSIS — K5900 Constipation, unspecified: Secondary | ICD-10-CM | POA: Diagnosis not present

## 2021-09-21 DIAGNOSIS — N3289 Other specified disorders of bladder: Secondary | ICD-10-CM | POA: Diagnosis not present

## 2021-09-21 DIAGNOSIS — K51011 Ulcerative (chronic) pancolitis with rectal bleeding: Secondary | ICD-10-CM | POA: Diagnosis not present

## 2021-09-21 DIAGNOSIS — K625 Hemorrhage of anus and rectum: Secondary | ICD-10-CM | POA: Insufficient documentation

## 2021-09-21 DIAGNOSIS — R1084 Generalized abdominal pain: Secondary | ICD-10-CM | POA: Diagnosis not present

## 2021-09-21 DIAGNOSIS — N400 Enlarged prostate without lower urinary tract symptoms: Secondary | ICD-10-CM | POA: Diagnosis not present

## 2021-09-21 DIAGNOSIS — K921 Melena: Secondary | ICD-10-CM | POA: Diagnosis not present

## 2021-09-21 DIAGNOSIS — Z7982 Long term (current) use of aspirin: Secondary | ICD-10-CM | POA: Diagnosis not present

## 2021-09-21 DIAGNOSIS — I700 Atherosclerosis of aorta: Secondary | ICD-10-CM | POA: Diagnosis not present

## 2021-09-21 DIAGNOSIS — N289 Disorder of kidney and ureter, unspecified: Secondary | ICD-10-CM | POA: Diagnosis not present

## 2021-09-21 DIAGNOSIS — K6389 Other specified diseases of intestine: Secondary | ICD-10-CM | POA: Diagnosis not present

## 2021-09-21 NOTE — Telephone Encounter (Signed)
Inbound call from patient calling to discuss upcoming apt. Please give a call back to further advise.  Thank  you

## 2021-09-21 NOTE — ED Provider Triage Note (Signed)
Emergency Medicine Provider Triage Evaluation Note  Joseph Booker , a 71 y.o. male  was evaluated in triage.  Pt complains of rectal bleeding x2 months. States whole toilet bowl is full of blood. History of UC. Had colonoscopy 1.5 years ago that just showed inflammation. Admits to some back pain. Concerned about kidney function.   Review of Systems  Positive: Rectal bleeding Negative: CP  Physical Exam  BP (!) 156/95 (BP Location: Left Arm)   Pulse (!) 54   Temp 97.7 F (36.5 C) (Oral)   Resp 15   Ht 5' 8"  (1.727 m)   Wt 65.8 kg   SpO2 99%   BMI 22.05 kg/m  Gen:   Awake, no distress   Resp:  Normal effort  MSK:   Moves extremities without difficulty  Other:    Medical Decision Making  Medically screening exam initiated at 11:13 PM.  Appropriate orders placed.  Joseph Booker was informed that the remainder of the evaluation will be completed by another provider, this initial triage assessment does not replace that evaluation, and the importance of remaining in the ED until their evaluation is complete.  labs   Suzy Bouchard, [place] 09/21/21 2314

## 2021-09-21 NOTE — ED Triage Notes (Signed)
Pt reports blood in stool x2 months.  Pt referred by GI doctor. Pt reports constipation and taking fiber and laxatives with no relief.  Pt reports lower right back pain  Hx UC

## 2021-09-21 NOTE — Telephone Encounter (Signed)
Appointment today for surgical clearance at the request of Kelby Aline NP

## 2021-09-21 NOTE — Telephone Encounter (Signed)
Patient called back to follow up on message that was sent earlier. Patient is requesting a call back tomorrow. Patient has an appointment at 11:00 with his cardiologist and is requesting a call before his appointment. Please advise.

## 2021-09-21 NOTE — Telephone Encounter (Signed)
I s/w the pt and he has been moved up sooner for tele appt per pre op provider Christen Bame, NP. Pt has been added to 09/22/21 @ 11 am.

## 2021-09-22 ENCOUNTER — Other Ambulatory Visit: Payer: Self-pay

## 2021-09-22 ENCOUNTER — Emergency Department (INDEPENDENT_AMBULATORY_CARE_PROVIDER_SITE_OTHER): Payer: Medicare Other | Admitting: Nurse Practitioner

## 2021-09-22 ENCOUNTER — Encounter: Payer: Self-pay | Admitting: Gastroenterology

## 2021-09-22 ENCOUNTER — Encounter: Payer: Self-pay | Admitting: Nurse Practitioner

## 2021-09-22 ENCOUNTER — Emergency Department (HOSPITAL_COMMUNITY): Payer: Medicare Other

## 2021-09-22 DIAGNOSIS — K5900 Constipation, unspecified: Secondary | ICD-10-CM | POA: Diagnosis not present

## 2021-09-22 DIAGNOSIS — K625 Hemorrhage of anus and rectum: Secondary | ICD-10-CM | POA: Diagnosis not present

## 2021-09-22 DIAGNOSIS — N400 Enlarged prostate without lower urinary tract symptoms: Secondary | ICD-10-CM | POA: Diagnosis not present

## 2021-09-22 DIAGNOSIS — K921 Melena: Secondary | ICD-10-CM | POA: Diagnosis not present

## 2021-09-22 DIAGNOSIS — Z01810 Encounter for preprocedural cardiovascular examination: Secondary | ICD-10-CM | POA: Diagnosis not present

## 2021-09-22 DIAGNOSIS — K6389 Other specified diseases of intestine: Secondary | ICD-10-CM | POA: Diagnosis not present

## 2021-09-22 DIAGNOSIS — N289 Disorder of kidney and ureter, unspecified: Secondary | ICD-10-CM | POA: Diagnosis not present

## 2021-09-22 DIAGNOSIS — K51011 Ulcerative (chronic) pancolitis with rectal bleeding: Secondary | ICD-10-CM

## 2021-09-22 DIAGNOSIS — N3289 Other specified disorders of bladder: Secondary | ICD-10-CM | POA: Diagnosis not present

## 2021-09-22 DIAGNOSIS — R1084 Generalized abdominal pain: Secondary | ICD-10-CM

## 2021-09-22 LAB — COMPREHENSIVE METABOLIC PANEL
ALT: 15 U/L (ref 0–44)
AST: 24 U/L (ref 15–41)
Albumin: 3.7 g/dL (ref 3.5–5.0)
Alkaline Phosphatase: 40 U/L (ref 38–126)
Anion gap: 5 (ref 5–15)
BUN: 17 mg/dL (ref 8–23)
CO2: 28 mmol/L (ref 22–32)
Calcium: 9.3 mg/dL (ref 8.9–10.3)
Chloride: 108 mmol/L (ref 98–111)
Creatinine, Ser: 1.03 mg/dL (ref 0.61–1.24)
GFR, Estimated: >60 mL/min (ref >60)
Glucose, Bld: 113 mg/dL — ABNORMAL HIGH (ref 70–99)
Potassium: 3.4 mmol/L — ABNORMAL LOW (ref 3.5–5.1)
Sodium: 141 mmol/L (ref 135–145)
Total Bilirubin: 0.8 mg/dL (ref 0.3–1.2)
Total Protein: 6.7 g/dL (ref 6.5–8.1)

## 2021-09-22 LAB — URINALYSIS, ROUTINE W REFLEX MICROSCOPIC
Bacteria, UA: NONE SEEN
Bilirubin Urine: NEGATIVE
Glucose, UA: NEGATIVE mg/dL
Ketones, ur: NEGATIVE mg/dL
Leukocytes,Ua: NEGATIVE
Nitrite: NEGATIVE
Protein, ur: NEGATIVE mg/dL
Specific Gravity, Urine: 1.005 (ref 1.005–1.030)
pH: 6 (ref 5.0–8.0)

## 2021-09-22 LAB — CBC WITH DIFFERENTIAL/PLATELET
Abs Immature Granulocytes: 0.03 10*3/uL (ref 0.00–0.07)
Basophils Absolute: 0.1 10*3/uL (ref 0.0–0.1)
Basophils Relative: 1 %
Eosinophils Absolute: 0.5 10*3/uL (ref 0.0–0.5)
Eosinophils Relative: 6 %
HCT: 44.8 % (ref 39.0–52.0)
Hemoglobin: 15.2 g/dL (ref 13.0–17.0)
Immature Granulocytes: 0 %
Lymphocytes Relative: 29 %
Lymphs Abs: 2.5 10*3/uL (ref 0.7–4.0)
MCH: 33.5 pg (ref 26.0–34.0)
MCHC: 33.9 g/dL (ref 30.0–36.0)
MCV: 98.7 fL (ref 80.0–100.0)
Monocytes Absolute: 1 10*3/uL (ref 0.1–1.0)
Monocytes Relative: 11 %
Neutro Abs: 4.5 10*3/uL (ref 1.7–7.7)
Neutrophils Relative %: 53 %
Platelets: 155 10*3/uL (ref 150–400)
RBC: 4.54 MIL/uL (ref 4.22–5.81)
RDW: 12.6 % (ref 11.5–15.5)
WBC: 8.6 10*3/uL (ref 4.0–10.5)
nRBC: 0 % (ref 0.0–0.2)

## 2021-09-22 LAB — TYPE AND SCREEN
ABO/RH(D): A POS
Antibody Screen: NEGATIVE

## 2021-09-22 LAB — POC OCCULT BLOOD, ED: Fecal Occult Bld: POSITIVE — AB

## 2021-09-22 MED ORDER — IOHEXOL 300 MG/ML  SOLN
100.0000 mL | Freq: Once | INTRAMUSCULAR | Status: AC | PRN
  Administered 2021-09-22: 100 mL via INTRAVENOUS

## 2021-09-22 MED ORDER — SODIUM CHLORIDE (PF) 0.9 % IJ SOLN
INTRAMUSCULAR | Status: AC
  Filled 2021-09-22: qty 50

## 2021-09-22 MED ORDER — HYDROCORTISONE ACETATE 25 MG RE SUPP: RECTAL | 0 refills | Status: DC

## 2021-09-22 MED ORDER — MESALAMINE 1000 MG RE SUPP: RECTAL | 12 refills | Status: DC

## 2021-09-22 NOTE — Telephone Encounter (Signed)
Inbound call from patient stating he is currently in the Ed. Patient calling to confirm ''flex sig'' apt for next week, I Did confirm apt with patient. Patient also states he is meeting with his cardiologist today at 11 am and would like to speak with a nurse to inquire about instructions.Please give a call to further advise.

## 2021-09-22 NOTE — Progress Notes (Signed)
Virtual Visit via Telephone Note   Because of Joseph Booker's co-morbid illnesses, he is at least at moderate risk for complications without adequate follow up.  This format is felt to be most appropriate for this patient at this time.  The patient did not have access to video technology/had technical difficulties with video requiring transitioning to audio format only (telephone).  All issues noted in this document were discussed and addressed.  No physical exam could be performed with this format.  Please refer to the patient's chart for his consent to telehealth for [facility].  Evaluation Performed:  Preoperative cardiovascular risk assessment _____________   Date:  09/22/2021   Patient ID:  Joseph Booker, DOB 1950-09-02, MRN [id number] Patient Location:  Home Provider location:   Office  Primary Care Provider:  Binnie Rail, MD Primary Cardiologist:  Skeet Latch, MD  Chief Complaint / Patient Profile   72 y.o. y/o male with a h/o CAD s/p stent to mLAD, mild to moderate scattered nonobstructive CAD on cath 2022, dilated aortic root, HLD, and chronic bradycardia who is pending flexible sigmoidoscopy and presents today for telephonic preoperative cardiovascular risk assessment.  Past Medical History    Past Medical History:  Diagnosis Date   Allergic rhinitis    Allergy    Anxiety state, unspecified    Ascending aortic aneurysm ([place]) 05/15/2020   Bradycardia 05/03/2021   CAD (coronary artery disease)    [name] / nuclear 2005, excellent tolerance no scar or ischemia, EF 51% / nuclear, February, 2012, no scar or ischemia / catheterization March 26, 2010.. 10% in-stent restenosis, minimal other nonobstructive coronary disease, ejection fraction 55%., excellent result; ETT 9/13: ex 13:14, no CP, no ischemic ECG changes   Cancer (HCC)    basal cell carcinoma x2 on face   Cataract    surgery to both eyes   Elevated blood pressure reading 05/15/2020    Glaucoma, left eye    left eye   History of exercise intolerance 05/23/2013   cardiopulmonary exercise test-- normal functional capacity when compared to matched sedentary norms, borderline ventilatory limitation with the patient's exercise tidal volume reaching 90% of the measured inspiratory capacity ( pt reports no dyspnea), did not appear to be circulatory limitation, normal CPX test   Hyperlipidemia    Nocturia    Osteopenia    Pure hypercholesterolemia 06/12/2009   Thoracic ascending aortic aneurysm [facility])    followed by dr Skeet Latch---  last CT 12-29-2016 ,  4.5cm   Ulcerative colitis, universal ([place])    GI-- [name]--  dx 1990s  (per note mild Mayo Score 1)   Wears glasses    Past Surgical History:  Procedure Laterality Date   CARDIAC CATHETERIZATION  03/26/2010    dr Dian Queen   single vessel CAD w/ patent mLAD stent w/ 10% in-stent restenosis w/ mild to moderate disease in the remainder LAD w/ no lesions that appearedto be flow-limitinf/  normal LVFSF, ef 55%   CARDIOVASCULAR STRESS TEST  06-30-2016   [name]   Low risk nuclear study w/ no evidence ishcemia/  normal LV function and wall motion,  nuclear stress ef 55%   COLONOSCOPY  last one 10-27-2016   CORONARY ANGIOPLASTY WITH STENT PLACEMENT  07-25-2002   [name]   PTCA w/  DES x1 to [place] Left 2006   INGUINAL HERNIA REPAIR Right 07/03/2017   Procedure: OPEN REPAIR OF RIGHT INGUINAL HERNIA WITH MESH;  Surgeon: Gurney Maxin  Marjory Lies, MD;  Location: [facility];  Service: General;  Laterality: Right;   INSERTION OF MESH Right 07/03/2017   Procedure: INSERTION OF MESH;  Surgeon: Kinsinger, Arta Bruce, MD;  Location: [facility];  Service: General;  Laterality: Right;   LEFT HEART CATH AND CORONARY ANGIOGRAPHY N/A 09/11/2020   Procedure: LEFT HEART CATH AND CORONARY ANGIOGRAPHY;  Surgeon: Jettie Booze, MD;  Location: [place] [facility];  Service:  Cardiovascular;  Laterality: N/A;   SEPTOPLASTY  1987   w/ RHINOPLASTY   THORACIC AORTOGRAM N/A 09/11/2020   Procedure: THORACIC AORTOGRAM;  Surgeon: Jettie Booze, MD;  Location: [place] [facility];  Service: Cardiovascular;  Laterality: N/A;   TONSILLECTOMY  1959   TRANSTHORACIC ECHOCARDIOGRAM  06-30-2016     dr Alison Murray 55-60%/  trivial AR/ mild dilated ascending aorta, 86m/  trivial MR/  mild PR and TR     Allergies  Allergies  Allergen Reactions   Atorvastatin Other (See Comments)    transaminits Transaminits, causes liver enzymes to go up    History of Present Illness    Joseph Booker a 71y.o. male who presents via audio/video conferencing for a telehealth visit today.  Pt was last seen in cardiology clinic on 05/03/21 by Dr. ROval Linsey  At that time [place].  The patient is now pending procedure as outlined above. Since his last visit, he  denies chest pain, shortness of breath, lower extremity edema, fatigue, palpitations, hematuria, hemoptysis, diaphoresis, weakness, presyncope, syncope, orthopnea, and PND. He is having a significant flare of UC and has not been as active over the previous 5-6 weeks but continues to achieve > 4 METS activity without chest pain or SOB.   Home Medications    Prior to Admission medications   Medication Sig Start Date End Date Taking? Authorizing Provider  ondansetron (ZOFRAN-ODT) 4 MG disintegrating tablet Take 1 tablet (4 mg total) by mouth daily as needed for up to 10 doses for nausea or vomiting. Patient not taking: Reported on 08/09/2021 07/13/21   NMauri Pole MD  aspirin 81 MG tablet Take 81 mg by mouth daily.    [provider]  Calcium Carb-Cholecalciferol (CALCIUM+D3) 600-20 MG-MCG TABS Take 1 tablet by mouth daily.    [provider]  clopidogrel (PLAVIX) 75 MG tablet Take 1 tablet by mouth once daily Patient taking differently: Take 75 mg by mouth daily. 04/26/21   RSkeet Latch  MD  Coenzyme Q10 (CO Q-10) 300 MG CAPS Take 300 mg by mouth every morning.    [provider]  dorzolamide (TRUSOPT) 2 % ophthalmic solution 1 drop 2 (two) times daily. Patient not taking: Reported on 09/01/2021 07/29/21   [provider]  escitalopram (LEXAPRO) 10 MG tablet Take 1 tablet (10 mg total) by mouth daily. Patient not taking: Reported on 09/01/2021 08/21/21   BBinnie Rail MD  hydrocortisone (ANUSOL-HC) 25 MG suppository Use 1 suppository every night for 12 days 09/22/21   ZMilton Ferguson MD  inFLIXimab (REMICADE IV) Inject 300 mg into the vein. AS DIRECTED    [provider]  latanoprost (XALATAN) 0.005 % ophthalmic solution Place 1 drop into the left eye daily. 08/06/20   [provider]  mesalamine (CANASA) 1000 MG suppository Use 1 suppository each morning for the next 12 days 09/22/21   ZMilton Ferguson MD  Multiple Vitamins-Minerals (MULTIVITAMIN WITH MINERALS) tablet Take 1 tablet by mouth daily.  [provider]  polyethylene glycol (MIRALAX / GLYCOLAX) 17 g packet Take 17 g by mouth daily.    [provider]  REPATHA SURECLICK 530 MG/ML SOAJ INJECT 140 MG INTO THE SKIN EVERY 14 DAYS Patient taking differently: Inject 140 mg into the skin every 14 (fourteen) days. Friday 05/03/21   Skeet Latch, MD  timolol (TIMOPTIC) 0.5 % ophthalmic solution Place 1 drop into both eyes 2 (two) times daily.     [provider]  Wheat Dextrin (BENEFIBER PO) Take 1 Scoop by mouth 3 (three) times daily. Mix 1 scoop with 8 ounce beverage and drink three times a day    [provider]    Physical Exam    Vital Signs:  Gevin Perea does not have vital signs available for review today.  Given telephonic nature of communication, physical exam is limited. AAOx3. NAD. Normal affect.  Speech and respirations are unlabored.  Accessory Clinical Findings    None  Assessment & Plan    1.  Preoperative Cardiovascular Risk  Assessment: The patient is doing well from a cardiac perspective. Therefore, based on ACC/AHA guidelines, the patient would be at acceptable risk for the planned procedure without further cardiovascular testing. The patient was advised that if he develops new symptoms prior to surgery to contact our office to arrange for a follow-up visit, and he verbalized understanding. According to the Revised Cardiac Risk Index (RCRI), his Perioperative Risk of Major Cardiac Event is (%): 0.4. His Functional Capacity in METs is: 6.61 according to the Duke Activity Status Index (DASI).  He may hold Plavix for 5 days prior to procedure.  Ideally aspirin should be continued without interruption, however if the bleeding risk is too great, aspirin may be held for 7 days prior to surgery. Please resume aspirin post operatively when it is felt to be safe from a bleeding standpoint.    A copy of this note will be routed to requesting surgeon.  Time:   Today, I have spent 10 minutes with the patient with telehealth technology discussing medical history, symptoms, and management plan.     Emmaline Life, NP-C     09/22/2021, 11:20 AM [address], [address] Office [phone] Fax [phone]

## 2021-09-22 NOTE — Telephone Encounter (Signed)
Flexible sigmoidoscopy is scheduled for 09/28/21. If Cardiology clears him for the procedure and to hold Plavix for 5 days prior to the procedure, he can begin with his prep on 09/27/21. Instructions for the prep are in his chart and accessible through My Chart under "letters."  Called to discuss with the patient. No answer. Left him this information on his voicemail.

## 2021-09-22 NOTE — Discharge Instructions (Signed)
Follow-up as planned to get your sigmoid scope.  Call your GI doctor if any problems

## 2021-09-22 NOTE — ED Provider Notes (Signed)
[facility] DEPT Provider Note   CSN: [id number] Arrival date & time: 09/21/21  2236     History  Chief Complaint  Patient presents with   Rectal Bleeding    Joseph Booker is a 71 y.o. male.  The history is provided by the patient.  Rectal Bleeding Quality:  Bright red Amount:  Moderate Duration:  2 months Timing:  Intermittent Chronicity:  New Context: not constipation   Relieved by:  Nothing Worsened by:  Nothing Ineffective treatments:  None tried Associated symptoms: no fever   Associated symptoms comment:  Low back pain Risk factors: no anticoagulant use and no hx of colorectal cancer        Home Medications Prior to Admission medications   Medication Sig Start Date End Date Taking? Authorizing Provider  aspirin 81 MG tablet Take 81 mg by mouth daily.   Yes [provider]  Calcium Carb-Cholecalciferol (CALCIUM+D3) 600-20 MG-MCG TABS Take 1 tablet by mouth daily.   Yes [provider]  clopidogrel (PLAVIX) 75 MG tablet Take 1 tablet by mouth once daily Patient taking differently: Take 75 mg by mouth daily. 04/26/21  Yes Skeet Latch, MD  Coenzyme Q10 (CO Q-10) 300 MG CAPS Take 300 mg by mouth every morning.   Yes [provider]  inFLIXimab (REMICADE IV) Inject 300 mg into the vein. AS DIRECTED   Yes [provider]  latanoprost (XALATAN) 0.005 % ophthalmic solution Place 1 drop into the left eye daily. 08/06/20  Yes [provider]  mesalamine (APRISO) 0.375 g 24 hr capsule Take 1.5 mg by mouth daily. 4 tablets   Yes [provider]  Multiple Vitamins-Minerals (MULTIVITAMIN WITH MINERALS) tablet Take 1 tablet by mouth daily.   Yes [provider]  ondansetron (ZOFRAN-ODT) 4 MG disintegrating tablet Take 1 tablet (4 mg total) by mouth daily as needed for up to 10 doses for nausea or vomiting. Patient not taking: Reported on 08/09/2021 07/13/21   Mauri Pole, MD   polyethylene glycol (MIRALAX / GLYCOLAX) 17 g packet Take 17 g by mouth daily.   Yes [provider]  REPATHA SURECLICK 696 MG/ML SOAJ INJECT 140 MG INTO THE SKIN EVERY 14 DAYS Patient taking differently: Inject 140 mg into the skin every 14 (fourteen) days. Friday 05/03/21  Yes Skeet Latch, MD  timolol (TIMOPTIC) 0.5 % ophthalmic solution Place 1 drop into both eyes 2 (two) times daily.    Yes [provider]  Wheat Dextrin (BENEFIBER PO) Take 1 Scoop by mouth 3 (three) times daily. Mix 1 scoop with 8 ounce beverage and drink three times a day   Yes [provider]  dorzolamide (TRUSOPT) 2 % ophthalmic solution 1 drop 2 (two) times daily. Patient not taking: Reported on 09/01/2021 07/29/21   [provider]  escitalopram (LEXAPRO) 10 MG tablet Take 1 tablet (10 mg total) by mouth daily. Patient not taking: Reported on 09/01/2021 08/21/21   Binnie Rail, MD      Allergies    Atorvastatin    Review of Systems   Review of Systems  Constitutional:  Negative for fever.  HENT:  Negative for facial swelling.   Eyes:  Negative for redness.  Respiratory:  Negative for wheezing and stridor.   Gastrointestinal:  Positive for anal bleeding and hematochezia. Negative for diarrhea.  All other systems reviewed and are negative.   Physical Exam Updated Vital Signs BP 125/76   Pulse (!) 54   Temp 98.1 F (36.7  C) (Oral)   Resp 16   Ht 5' 8"  (1.727 m)   Wt 65.8 kg   SpO2 97%   BMI 22.05 kg/m  Physical Exam Vitals and nursing note reviewed. Exam conducted with a chaperone present.  Constitutional:      General: He is not in acute distress.    Appearance: He is well-developed. He is not diaphoretic.  HENT:     Head: Normocephalic and atraumatic.     Nose: Nose normal.  Eyes:     Conjunctiva/sclera: Conjunctivae normal.     Pupils: Pupils are equal, round, and reactive to light.  Cardiovascular:     Rate and Rhythm: Normal rate and regular rhythm.      Pulses: Normal pulses.     Heart sounds: Normal heart sounds.  Pulmonary:     Effort: Pulmonary effort is normal.     Breath sounds: Normal breath sounds. No wheezing or rales.  Abdominal:     General: Bowel sounds are normal.     Palpations: Abdomen is soft.     Tenderness: There is no abdominal tenderness. There is no guarding or rebound.  Genitourinary:    Rectum: Guaiac result positive.  Musculoskeletal:        General: Normal range of motion.     Cervical back: Normal range of motion and neck supple.  Skin:    General: Skin is warm and dry.     Capillary Refill: Capillary refill takes less than 2 seconds.  Neurological:     Mental Status: He is alert and oriented to person, place, and time.     ED Results / Procedures / Treatments   Labs (all labs ordered are listed, but only abnormal results are displayed) Results for orders placed or performed during the hospital encounter of 09/21/21  CBC with Differential  Result Value Ref Range   WBC 8.6 4.0 - 10.5 K/uL   RBC 4.54 4.22 - 5.81 MIL/uL   Hemoglobin 15.2 13.0 - 17.0 g/dL   HCT 44.8 39.0 - 52.0 %   MCV 98.7 80.0 - 100.0 fL   MCH 33.5 26.0 - 34.0 pg   MCHC 33.9 30.0 - 36.0 g/dL   RDW 12.6 11.5 - 15.5 %   Platelets 155 150 - 400 K/uL   nRBC 0.0 0.0 - 0.2 %   Neutrophils Relative % 53 %   Neutro Abs 4.5 1.7 - 7.7 K/uL   Lymphocytes Relative 29 %   Lymphs Abs 2.5 0.7 - 4.0 K/uL   Monocytes Relative 11 %   Monocytes Absolute 1.0 0.1 - 1.0 K/uL   Eosinophils Relative 6 %   Eosinophils Absolute 0.5 0.0 - 0.5 K/uL   Basophils Relative 1 %   Basophils Absolute 0.1 0.0 - 0.1 K/uL   Immature Granulocytes 0 %   Abs Immature Granulocytes 0.03 0.00 - 0.07 K/uL  Comprehensive metabolic panel  Result Value Ref Range   Sodium 141 135 - 145 mmol/L   Potassium 3.4 (L) 3.5 - 5.1 mmol/L   Chloride 108 98 - 111 mmol/L   CO2 28 22 - 32 mmol/L   Glucose, Bld 113 (H) 70 - 99 mg/dL   BUN 17 8 - 23 mg/dL   Creatinine, Ser  1.03 0.61 - 1.24 mg/dL   Calcium 9.3 8.9 - 10.3 mg/dL   Total Protein 6.7 6.5 - 8.1 g/dL   Albumin 3.7 3.5 - 5.0 g/dL   AST 24 15 - 41 U/L   ALT 15 0 - 44  U/L   Alkaline Phosphatase 40 38 - 126 U/L   Total Bilirubin 0.8 0.3 - 1.2 mg/dL   GFR, Estimated >60 >60 mL/min   Anion gap 5 5 - 15  Urinalysis, Routine w reflex microscopic Urine, Clean Catch  Result Value Ref Range   Color, Urine STRAW (A) YELLOW   APPearance CLEAR CLEAR   Specific Gravity, Urine 1.005 1.005 - 1.030   pH 6.0 5.0 - 8.0   Glucose, UA NEGATIVE NEGATIVE mg/dL   Hgb urine dipstick SMALL (A) NEGATIVE   Bilirubin Urine NEGATIVE NEGATIVE   Ketones, ur NEGATIVE NEGATIVE mg/dL   Protein, ur NEGATIVE NEGATIVE mg/dL   Nitrite NEGATIVE NEGATIVE   Leukocytes,Ua NEGATIVE NEGATIVE   RBC / HPF 0-5 0 - 5 RBC/hpf   WBC, UA 0-5 0 - 5 WBC/hpf   Bacteria, UA NONE SEEN NONE SEEN   Squamous Epithelial / LPF 0-5 0 - 5  POC occult blood, ED Provider will collect  Result Value Ref Range   Fecal Occult Bld POSITIVE (A) NEGATIVE  Type and screen [facility]  Result Value Ref Range   ABO/RH(D) A POS    Antibody Screen NEG    Sample Expiration      09/25/2021,2359 Performed at [facility], [place] [address], [place], [place] [address]    No results found.   Radiology No results found.  Procedures Procedures    Medications Ordered in ED Medications  sodium chloride (PF) 0.9 % injection (has no administration in time range)    ED Course/ Medical Decision Making/ A&P                           Medical Decision Making Patient with rectal bleeding for 2 months.  Told by his GI doctor to come in for work up.   Amount and/or Complexity of Data Reviewed External Data Reviewed: labs and notes.    Details: Previous notes and labs reviewed  Labs: ordered.    Details: All labs reviewed:  trace hemo positive.  Normal white count 8.6, normal hemoglobin 15.3, platelets 155K.  Normal sodium  141, potassium 3.4 normal creatinine.  Normal urine.   Radiology: ordered.    Details: Pending     Final Clinical Impression(s) / ED Diagnoses Final diagnoses:  None   Signed out to Dr. Roderic Palau pending CT, disposition based on CT Rx / DC Orders ED Discharge Orders     None         Chaniyah Jahr, MD 09/22/21 0347

## 2021-09-22 NOTE — ED Provider Notes (Signed)
Patient was seen by GI and they recommended mesalamine and hydrocortisone suppositories.  He will follow-up next week for sigmoidoscopy   Milton Ferguson, MD 09/22/21 1019

## 2021-09-24 ENCOUNTER — Telehealth: Payer: Medicare Other

## 2021-09-25 ENCOUNTER — Encounter: Payer: Self-pay | Admitting: Gastroenterology

## 2021-09-26 LAB — CALPROTECTIN, FECAL: Calprotectin, Fecal: 550 ug/g — ABNORMAL HIGH (ref 0–120)

## 2021-09-28 ENCOUNTER — Encounter: Payer: Self-pay | Admitting: Gastroenterology

## 2021-09-28 ENCOUNTER — Ambulatory Visit (AMBULATORY_SURGERY_CENTER): Payer: Medicare Other | Admitting: Gastroenterology

## 2021-09-28 ENCOUNTER — Other Ambulatory Visit: Payer: Self-pay

## 2021-09-28 ENCOUNTER — Other Ambulatory Visit: Payer: Medicare Other

## 2021-09-28 VITALS — BP 113/77 | HR 50 | Temp 97.5°F | Resp 13 | Ht 68.0 in | Wt 148.0 lb

## 2021-09-28 DIAGNOSIS — K51011 Ulcerative (chronic) pancolitis with rectal bleeding: Secondary | ICD-10-CM

## 2021-09-28 DIAGNOSIS — K6289 Other specified diseases of anus and rectum: Secondary | ICD-10-CM

## 2021-09-28 DIAGNOSIS — I2510 Atherosclerotic heart disease of native coronary artery without angina pectoris: Secondary | ICD-10-CM | POA: Diagnosis not present

## 2021-09-28 DIAGNOSIS — F419 Anxiety disorder, unspecified: Secondary | ICD-10-CM | POA: Diagnosis not present

## 2021-09-28 MED ORDER — SODIUM CHLORIDE 0.9 % IV SOLN: 500.0000 mL | Freq: Once | INTRAVENOUS | Status: DC

## 2021-09-28 MED ORDER — MESALAMINE 1.2 G PO TBEC: 4.8000 g | DELAYED_RELEASE_TABLET | Freq: Every day | ORAL | 3 refills | Status: DC

## 2021-09-28 MED ORDER — MESALAMINE 4 G RE ENEM: 4.0000 g | ENEMA | Freq: Every day | RECTAL | 0 refills | Status: DC

## 2021-09-28 NOTE — Progress Notes (Signed)
Called to room to assist during endoscopic procedure.  Patient ID and intended procedure confirmed with present staff. Received instructions for my participation in the procedure from the performing physician.  

## 2021-09-28 NOTE — Progress Notes (Signed)
PT taken to PACU. Monitors in place. VSS. Report given to RN. 

## 2021-09-28 NOTE — Patient Instructions (Addendum)
Read all of the handouts given to you by your recovery room nurse.  You may resume you plavix tomorrow.  Take your new medications as ordered.  Follow up with your managing physician.  You will go to the lab for bloodwork today before you go home.  YOU HAD AN ENDOSCOPIC PROCEDURE TODAY AT [facility]:   Refer to the procedure report that was given to you for any specific questions about what was found during the examination.  If the procedure report does not answer your questions, please call your gastroenterologist to clarify.  If you requested that your care partner not be given the details of your procedure findings, then the procedure report has been included in a sealed envelope for you to review at your convenience later.  YOU SHOULD EXPECT: Some feelings of bloating in the abdomen. Passage of more gas than usual.  Walking can help get rid of the air that was put into your GI tract during the procedure and reduce the bloating. If you had a lower endoscopy (such as a colonoscopy or flexible sigmoidoscopy) you may notice spotting of blood in your stool or on the toilet paper. If you underwent a bowel prep for your procedure, you may not have a normal bowel movement for a few days.  Please Note:  You might notice some irritation and congestion in your nose or some drainage.  This is from the oxygen used during your procedure.  There is no need for concern and it should clear up in a day or so.  SYMPTOMS TO REPORT IMMEDIATELY:  Following lower endoscopy (colonoscopy or flexible sigmoidoscopy):  Excessive amounts of blood in the stool  Significant tenderness or worsening of abdominal pains  Swelling of the abdomen that is new, acute  Fever of 100F or higher   For urgent or emergent issues, a gastroenterologist can be reached at any hour by calling [phone]. Do not use MyChart messaging for urgent concerns.    DIET:  We do recommend a small meal at first, but then you may  proceed to your regular diet.  Drink plenty of fluids but you should avoid alcoholic beverages for 24 hours.  ACTIVITY:  You should plan to take it easy for the rest of today and you should NOT DRIVE or use heavy machinery until tomorrow (because of the sedation medicines used during the test).    FOLLOW UP: Our staff will call the number listed on your records the next business day following your procedure.  We will call around 7:15- 8:00 am to check on you and address any questions or concerns that you may have regarding the information given to you following your procedure. If we do not reach you, we will leave a message.     If any biopsies were taken you will be contacted by phone or by letter within the next 1-3 weeks.  Please call us at [phone] if you have not heard about the biopsies in 3 weeks.    SIGNATURES/CONFIDENTIALITY: You and/or your care partner have signed paperwork which will be entered into your electronic medical record.  These signatures attest to the fact that that the information above on your After Visit Summary has been reviewed and is understood.  Full responsibility of the confidentiality of this discharge information lies with you and/or your care-partner.

## 2021-09-28 NOTE — Op Note (Signed)
[place] Patient Name: Joseph Booker Procedure Date: 09/28/2021 2:21 PM MRN: [id number] Endoscopist: Mauri Pole , MD Age: 71 Referring MD:  Date of Birth: Nov 08, 1950 Gender: Male Account #: [id number] Procedure:                Flexible Sigmoidoscopy Indications:              Rectal hemorrhage, Chronic ulcerative pancolitis Medicines:                Monitored Anesthesia Care Procedure:                Pre-Anesthesia Assessment:                           - Prior to the procedure, a History and Physical                            was performed, and patient medications and                            allergies were reviewed. The patient's tolerance of                            previous anesthesia was also reviewed. The risks                            and benefits of the procedure and the sedation                            options and risks were discussed with the patient.                            All questions were answered, and informed consent                            was obtained. Prior Anticoagulants: The patient                            last took Plavix (clopidogrel) 5 days prior to the                            procedure. ASA Grade Assessment: III - A patient                            with severe systemic disease. After reviewing the                            risks and benefits, the patient was deemed in                            satisfactory condition to undergo the procedure.                           After obtaining informed consent, the scope was  passed under direct vision. The Olympus PCF-H190DL                            (#[id number]) Colonoscope was introduced through the                            anus and advanced to the the descending colon. The                            flexible sigmoidoscopy was accomplished without                            difficulty. The patient tolerated the procedure                             well. Scope In: 2:33:13 PM Scope Out: 2:40:02 PM Total Procedure Duration: 0 hours 6 minutes 49 seconds  Findings:                 The perianal and digital rectal examinations were                            normal.                           A continuous area of ulcerated mucosa with stigmata                            of recent bleeding was present in the rectum                            extending to rectosigmoid. Biopsies were taken with                            a cold forceps for histology.                           Normal mucosa was found sigmoid colon to visualized                            extent in the descending colon.                           Retroflexion not performed in the rectum due to                            proctitis. Complications:            No immediate complications. Estimated Blood Loss:     Estimated blood loss was minimal. Impression:               - Mucosal ulceration. Biopsied.                           - Normal mucosa in the sigmoid colon. Recommendation:           - Use Rowasa enemas 1 per rectum  BID for 2 weeks.                           - Resume previous diet.                           - Continue present medications.                           - Resume Plavix (clopidogrel) at prior dose                            tomorrow. Refer to managing physician for further                            adjustment of therapy.                           - Follow up anti TNF Ab and drug level Mauri Pole, MD 09/28/2021 3:04:19 PM This report has been signed electronically.

## 2021-09-28 NOTE — Progress Notes (Signed)
Pt's states no medical or surgical changes since previsit or office visit. 

## 2021-09-28 NOTE — Progress Notes (Signed)
[name] History and Physical   Primary Care Physician:  Binnie Rail, MD   Reason for Procedure:  Rectal bleeding, mucus  Plan:    Flexible sigmoidoscopy with possible interventions as needed     HPI: Joseph Booker is a very pleasant 71 y.o. male here for flexible sigmoidoscopy to evaluate persistent rectal bleeding, mucus discharge and fecal urgency.   The risks and benefits as well as alternatives of endoscopic procedure(s) have been discussed and reviewed. All questions answered. The patient agrees to proceed.    Past Medical History:  Diagnosis Date   Allergic rhinitis    Allergy    Anxiety state, unspecified    Ascending aortic aneurysm ([place]) 05/15/2020   Bradycardia 05/03/2021   CAD (coronary artery disease)    [name] / nuclear 2005, excellent tolerance no scar or ischemia, EF 51% / nuclear, February, 2012, no scar or ischemia / catheterization March 26, 2010.. 10% in-stent restenosis, minimal other nonobstructive coronary disease, ejection fraction 55%., excellent result; ETT 9/13: ex 13:14, no CP, no ischemic ECG changes   Cancer (HCC)    basal cell carcinoma x2 on face   Cataract    surgery to both eyes   Elevated blood pressure reading 05/15/2020   Glaucoma, left eye    left eye   History of exercise intolerance 05/23/2013   cardiopulmonary exercise test-- normal functional capacity when compared to matched sedentary norms, borderline ventilatory limitation with the patient's exercise tidal volume reaching 90% of the measured inspiratory capacity ( pt reports no dyspnea), did not appear to be circulatory limitation, normal CPX test   Hyperlipidemia    Nocturia    Osteopenia    Pure hypercholesterolemia 06/12/2009   Thoracic ascending aortic aneurysm [facility])    followed by dr Skeet Latch---  last CT 12-29-2016 ,  4.5cm   Ulcerative colitis, universal ([place])    GI-- [name]--  dx 1990s  (per note mild Mayo Score 1)   Wears glasses      Past Surgical History:  Procedure Laterality Date   CARDIAC CATHETERIZATION  03/26/2010    dr Dian Queen   single vessel CAD w/ patent mLAD stent w/ 10% in-stent restenosis w/ mild to moderate disease in the remainder LAD w/ no lesions that appearedto be flow-limitinf/  normal LVFSF, ef 55%   CARDIOVASCULAR STRESS TEST  06-30-2016   [name]   Low risk nuclear study w/ no evidence ishcemia/  normal LV function and wall motion,  nuclear stress ef 55%   COLONOSCOPY  last one 10-27-2016   CORONARY ANGIOPLASTY WITH STENT PLACEMENT  07-25-2002   [name]   PTCA w/  DES x1 to [place] Left 2006   INGUINAL HERNIA REPAIR Right 07/03/2017   Procedure: OPEN REPAIR OF RIGHT INGUINAL HERNIA WITH MESH;  Surgeon: Kinsinger, Arta Bruce, MD;  Location: [place];  Service: General;  Laterality: Right;   INSERTION OF MESH Right 07/03/2017   Procedure: INSERTION OF MESH;  Surgeon: Kieth Brightly Arta Bruce, MD;  Location: [place];  Service: General;  Laterality: Right;   LEFT HEART CATH AND CORONARY ANGIOGRAPHY N/A 09/11/2020   Procedure: LEFT HEART CATH AND CORONARY ANGIOGRAPHY;  Surgeon: Jettie Booze, MD;  Location: [place] [facility];  Service: Cardiovascular;  Laterality: N/A;   SEPTOPLASTY  1987   w/ RHINOPLASTY   THORACIC AORTOGRAM N/A 09/11/2020   Procedure: THORACIC AORTOGRAM;  Surgeon: Jettie Booze, MD;  Location: [place]  [facility];  Service: Cardiovascular;  Laterality: N/A;   TONSILLECTOMY  1959   TRANSTHORACIC ECHOCARDIOGRAM  06-30-2016     dr Alison Murray 55-60%/  trivial AR/ mild dilated ascending aorta, 1m/  trivial MR/  mild PR and TR     Prior to Admission medications   Medication Sig Start Date End Date Taking? Authorizing Provider  aspirin 81 MG tablet Take 81 mg by mouth daily.   Yes [provider]  Calcium Carb-Cholecalciferol (CALCIUM+D3) 600-20 MG-MCG TABS Take 1 tablet by mouth daily.   Yes [provider]  Coenzyme Q10 (CO Q-10) 300 MG CAPS Take 300 mg by mouth every morning.   Yes [provider]  hydrocortisone (ANUSOL-HC) 25 MG suppository Use 1 suppository every night for 12 days 09/22/21  Yes ZMilton Ferguson MD  latanoprost (XALATAN) 0.005 % ophthalmic solution Place 1 drop into the left eye daily. 08/06/20  Yes [provider]  mesalamine (CANASA) 1000 MG suppository Use 1 suppository each morning for the next 12 days 09/22/21  Yes ZMilton Ferguson MD  Multiple Vitamins-Minerals (MULTIVITAMIN WITH MINERALS) tablet Take 1 tablet by mouth daily.   Yes [provider]  timolol (TIMOPTIC) 0.5 % ophthalmic solution Place 1 drop into both eyes 2 (two) times daily.    Yes [provider]  Wheat Dextrin (BENEFIBER PO) Take 1 Scoop by mouth 3 (three) times daily. Mix 1 scoop with 8 ounce beverage and drink three times a day   Yes [provider]  clopidogrel (PLAVIX) 75 MG tablet Take 1 tablet by mouth once daily Patient taking differently: Take 75 mg by mouth daily. 04/26/21   RSkeet Latch MD  inFLIXimab (REMICADE IV) Inject 300 mg into the vein. AS DIRECTED    [provider]  ondansetron (ZOFRAN-ODT) 4 MG disintegrating tablet Take 1 tablet (4 mg total) by mouth daily as needed for up to 10 doses for nausea or vomiting. Patient not taking: Reported on 09/28/2021 07/13/21   NMauri Pole MD  REPATHA SURECLICK 1846MG/ML SOAJ INJECT 140 MG INTO THE SKIN EVERY 14 DAYS Patient taking differently: Inject 140 mg into the skin every 14 (fourteen) days. Friday 05/03/21   RSkeet Latch MD    Current Outpatient Medications  Medication Sig Dispense Refill   aspirin 81 MG tablet Take 81 mg by mouth daily.     Calcium Carb-Cholecalciferol (CALCIUM+D3) 600-20 MG-MCG TABS Take 1 tablet by mouth daily.     Coenzyme Q10 (CO Q-10) 300 MG CAPS Take 300 mg by mouth every morning.     hydrocortisone (ANUSOL-HC) 25 MG suppository Use 1  suppository every night for 12 days 24 suppository 0   latanoprost (XALATAN) 0.005 % ophthalmic solution Place 1 drop into the left eye daily.     mesalamine (CANASA) 1000 MG suppository Use 1 suppository each morning for the next 12 days 30 suppository 12   Multiple Vitamins-Minerals (MULTIVITAMIN WITH MINERALS) tablet Take 1 tablet by mouth daily.     timolol (TIMOPTIC) 0.5 % ophthalmic solution Place 1 drop into both eyes 2 (two) times daily.      Wheat Dextrin (BENEFIBER PO) Take 1 Scoop by mouth 3 (three) times daily. Mix 1 scoop with 8 ounce beverage and drink three times a day     clopidogrel (PLAVIX) 75 MG tablet Take 1 tablet by mouth once daily (Patient taking differently: Take 75 mg by mouth daily.) 90 tablet 3   inFLIXimab (REMICADE IV) Inject 300 mg into  the vein. AS DIRECTED     ondansetron (ZOFRAN-ODT) 4 MG disintegrating tablet Take 1 tablet (4 mg total) by mouth daily as needed for up to 10 doses for nausea or vomiting. (Patient not taking: Reported on 09/28/2021) 10 tablet 0   REPATHA SURECLICK 784 MG/ML SOAJ INJECT 140 MG INTO THE SKIN EVERY 14 DAYS (Patient taking differently: Inject 140 mg into the skin every 14 (fourteen) days. Friday) 2 mL 11   Current Facility-Administered Medications  Medication Dose Route Frequency Provider Last Rate Last Admin   0.9 %  sodium chloride infusion  500 mL Intravenous Once Mauri Pole, MD        Allergies as of 09/28/2021 - Review Complete 09/28/2021  Allergen Reaction Noted   Atorvastatin Other (See Comments) 04/04/2019    Family History  Problem Relation Age of Onset   Diabetes Mother    Heart disease Mother    Lung disease Mother        ILD   Rheum arthritis Mother    CAD Mother    Heart disease Father 77       MI   Heart attack Father    Asthma Sister    Colon cancer Neg Hx    Esophageal cancer Neg Hx    Pancreatic cancer Neg Hx    Kidney disease Neg Hx    Liver disease Neg Hx    Rectal cancer Neg Hx    Stomach  cancer Neg Hx     Social History   Socioeconomic History   Marital status: Married    Spouse name: Not on file   Number of children: 1   Years of education: Not on file   Highest education level: Not on file  Occupational History   Occupation: retired    Fish farm manager: RETIRED  Tobacco Use   Smoking status: Never   Smokeless tobacco: Never   Tobacco comments:    Married, 1 stepson, 5 g-kids. Retired Emergency planning/management officer, now Environmental health practitioner Use: Never used  Substance and Sexual Activity   Alcohol use: Yes    Comment: occasional wine   Drug use: No   Sexual activity: Yes    Partners: Female  Other Topics Concern   Not on file  Social History Narrative   Exercising regularly: walks at least 5 miles a day, 6 days a week   Social Determinants of Radio broadcast assistant Strain: Not on file  Food Insecurity: Not on file  Transportation Needs: Not on file  Physical Activity: Not on file  Stress: Not on file  Social Connections: Not on file  Intimate Partner Violence: Not on file    Review of Systems:  All other review of systems negative except as mentioned in the HPI.  Physical Exam: Vital signs in last 24 hours: BP 122/67   Pulse (!) 52   Temp (!) 97.5 F (36.4 C)   Ht 5' 8"  (1.727 m)   Wt 148 lb (67.1 kg)   SpO2 99%   BMI 22.50 kg/m  General:   Alert, NAD Lungs:  Clear .   Heart:  Regular rate and rhythm Abdomen:  Soft, nontender and nondistended. Neuro/Psych:  Alert and cooperative. Normal mood and affect. A and O x 3  Reviewed labs, radiology imaging, old records and pertinent past GI work up  Patient is appropriate for planned procedure(s) and anesthesia in an ambulatory setting   K. Denzil Magnuson , MD [phone]

## 2021-09-28 NOTE — Progress Notes (Signed)
Patient stated that he did not want to wake up.  Dr. Silverio Decamp spent several minutes speaking with the patient and his wife.  He agreed to take the suggested medications.  [name] put the meds into the computer and also entered the labs for today.  Patient to the lab before going home today.

## 2021-09-29 ENCOUNTER — Encounter: Payer: Self-pay | Admitting: Gastroenterology

## 2021-09-29 ENCOUNTER — Telehealth: Payer: Self-pay | Admitting: *Deleted

## 2021-09-29 NOTE — Telephone Encounter (Signed)
  Follow up Call-     09/28/2021    1:37 PM 04/17/2020    7:40 AM  Call back number  Post procedure Call Back phone  # [phone]  Permission to leave phone message Yes Yes     Patient questions:  Do you have a fever, pain , or abdominal swelling? No. Pain Score  0 *  Have you tolerated food without any problems? Yes.    Have you been able to return to your normal activities? Yes.    Do you have any questions about your discharge instructions: Diet   No. Medications  No. Follow up visit  No.  Do you have questions or concerns about your Care? No.  Actions: * If pain score is 4 or above: No action needed, pain <4.

## 2021-10-01 ENCOUNTER — Encounter: Payer: Self-pay | Admitting: Gastroenterology

## 2021-10-03 LAB — SERIAL MONITORING

## 2021-10-04 ENCOUNTER — Other Ambulatory Visit: Payer: Medicare Other | Admitting: Gastroenterology

## 2021-10-04 LAB — SPECIMEN STATUS REPORT

## 2021-10-04 LAB — INFLIXIMAB+AB (SERIAL MONITOR)
Anti-Infliximab Antibody: <22 ng/mL
Infliximab Drug Level: 39 ug/mL

## 2021-10-07 ENCOUNTER — Ambulatory Visit (INDEPENDENT_AMBULATORY_CARE_PROVIDER_SITE_OTHER): Payer: Medicare Other | Admitting: Gastroenterology

## 2021-10-07 ENCOUNTER — Encounter: Payer: Self-pay | Admitting: Gastroenterology

## 2021-10-07 VITALS — BP 122/82 | HR 59 | Ht 68.0 in | Wt 144.8 lb

## 2021-10-07 DIAGNOSIS — Z5181 Encounter for therapeutic drug level monitoring: Secondary | ICD-10-CM | POA: Diagnosis not present

## 2021-10-07 DIAGNOSIS — Z23 Encounter for immunization: Secondary | ICD-10-CM | POA: Diagnosis not present

## 2021-10-07 DIAGNOSIS — Z79620 Long term (current) use of immunosuppressive biologic: Secondary | ICD-10-CM

## 2021-10-07 DIAGNOSIS — I2510 Atherosclerotic heart disease of native coronary artery without angina pectoris: Secondary | ICD-10-CM | POA: Diagnosis not present

## 2021-10-07 DIAGNOSIS — K51911 Ulcerative colitis, unspecified with rectal bleeding: Secondary | ICD-10-CM

## 2021-10-07 DIAGNOSIS — K51211 Ulcerative (chronic) proctitis with rectal bleeding: Secondary | ICD-10-CM | POA: Diagnosis not present

## 2021-10-07 MED ORDER — MESALAMINE 4 G RE ENEM: 4.0000 g | ENEMA | Freq: Two times a day (BID) | RECTAL | 1 refills | Status: DC

## 2021-10-07 MED ORDER — MESALAMINE 1.2 G PO TBEC: 4.8000 g | DELAYED_RELEASE_TABLET | Freq: Every day | ORAL | 11 refills | Status: DC

## 2021-10-07 NOTE — Progress Notes (Signed)
Joseph Booker    [id number]    [birth year]  Primary Care Physician:Burns, Claudina Lick, MD  Referring Physician: Binnie Rail, MD [place],  [place] [address]   Chief complaint: Diarrhea, ulcerative colitis  HPI:  71 year old very pleasant gentleman with history of chronic ulcerative colitis, was in clinical remission on daily mesalamine for almost past 17 years with symptoms of acute severe UC flare since June 2023   He did not have any improvement on prednisone 40 mg daily.  GI stool pathogen panel and C. difficile negative X 2  He is currently getting induction dose of Infliximab Random drug level for infliximab on 09/28/2021 was 39 with undetectable antibodies  CMV PCR negative  CRP 1.53 and sed rate 8 on September 16, 2021  Fecal calprotectin 550 on September 21, 2021  Flexible sigmoidoscopy 09/28/2021 - The perianal and digital rectal examinations were normal. - A continuous area of ulcerated mucosa with stigmata of recent bleeding was present in the rectum extending to rectosigmoid. Biopsies were taken with a cold forceps for histology. - Normal mucosa was found sigmoid colon to visualized extent in the descending colon. Retroflexion not performed in the rectum due to proctitis.  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    He is tolerating mesalamine enema but is unable to retain it for more than 2 hours at a time.  Initially when it was tried few months ago he had severe reaction to it but does not have it anymore   He had a mild flare in April 2022, he was treated with tapered dose of budesonide.  After taking it for 2 months he was experiencing side effects and has since stopped taking it. Prior to the episode of UC flare in April, he was on  chronic antibiotics for ocular rosacea.     Colonoscopy April 17, 2020 - The perianal and digital rectal examinations were normal. - Inflammation characterized by altered vascularity, congestion (edema), erythema and scarring was found as medium patches surrounded by normal mucosa in the rectum, in the descending colon, in the splenic flexure, in the transverse colon, in the ascending colon and at the cecum. This was mild in severity, and when compared to previous examinations, the findings are worsened. Biopsies were taken with a cold forceps for histology. - Non-bleeding internal hemorrhoids were found during retroflexion. The hemorrhoids were medium-sized.   1. Surgical [P], right colon - MILDLY ACTIVE CHRONIC COLITIS, CONSISTENT WITH PATIENT'S CLINICAL HISTORY OF ULCERATIVE COLITIS - NEGATIVE FOR GRANULOMAS OR DYSPLASIA 2. Surgical [P], left colon - COLONIC MUCOSA WITH NO SPECIFIC HISTOPATHOLOGIC CHANGES - NEGATIVE FOR ACUTE INFLAMMATION, FEATURES OF CHRONICITY, GRANULOMAS OR DYSPLASIA       Outpatient Encounter Medications as of 10/07/2021  Medication Sig   aspirin 81 MG tablet Take 81 mg by mouth daily.   Calcium Carb-Cholecalciferol (CALCIUM+D3) 600-20 MG-MCG TABS Take 1 tablet by mouth daily.   clopidogrel (PLAVIX) 75 MG tablet Take 1 tablet by mouth once daily (Patient taking differently: Take 75 mg by mouth daily.)   Coenzyme Q10 (CO Q-10) 300 MG CAPS Take 300 mg by mouth every morning.   hydrocortisone (ANUSOL-HC) 25 MG suppository Use 1 suppository every night for 12 days  inFLIXimab (REMICADE IV) Inject 300 mg into the vein. AS DIRECTED   latanoprost (XALATAN) 0.005 % ophthalmic solution Place 1 drop into the left eye daily.   mesalamine (LIALDA) 1.2 g EC tablet Take 4 tablets (4.8 g total) by mouth daily with breakfast.   mesalamine (ROWASA) 4 g enema Place 60 mLs (4 g total) rectally at bedtime.   Multiple Vitamins-Minerals (MULTIVITAMIN WITH MINERALS) tablet Take 1  tablet by mouth daily.   ondansetron (ZOFRAN-ODT) 4 MG disintegrating tablet Take 1 tablet (4 mg total) by mouth daily as needed for up to 10 doses for nausea or vomiting.   polyethylene glycol (MIRALAX / GLYCOLAX) 17 g packet Take 17 g by mouth daily.   REPATHA SURECLICK 947 MG/ML SOAJ INJECT 140 MG INTO THE SKIN EVERY 14 DAYS (Patient taking differently: Inject 140 mg into the skin every 14 (fourteen) days. Friday)   timolol (TIMOPTIC) 0.5 % ophthalmic solution Place 1 drop into both eyes 2 (two) times daily.    Wheat Dextrin (BENEFIBER PO) Take 1 Scoop by mouth 3 (three) times daily. Mix 1 scoop with 8 ounce beverage and drink three times a day   No facility-administered encounter medications on file as of 10/07/2021.    Allergies as of 10/07/2021 - Review Complete 09/28/2021  Allergen Reaction Noted   Atorvastatin Other (See Comments) 04/04/2019    Past Medical History:  Diagnosis Date   Allergic rhinitis    Allergy    Anxiety state, unspecified    Ascending aortic aneurysm ([place]) 05/15/2020   Bradycardia 05/03/2021   CAD (coronary artery disease)    [name] / nuclear 2005, excellent tolerance no scar or ischemia, EF 51% / nuclear, February, 2012, no scar or ischemia / catheterization March 26, 2010.. 10% in-stent restenosis, minimal other nonobstructive coronary disease, ejection fraction 55%., excellent result; ETT 9/13: ex 13:14, no CP, no ischemic ECG changes   Cancer (HCC)    basal cell carcinoma x2 on face   Cataract    surgery to both eyes   Elevated blood pressure reading 05/15/2020   Glaucoma, left eye    left eye   History of exercise intolerance 05/23/2013   cardiopulmonary exercise test-- normal functional capacity when compared to matched sedentary norms, borderline ventilatory limitation with the patient's exercise tidal volume reaching 90% of the measured inspiratory capacity ( pt reports no dyspnea), did not appear to be circulatory limitation, normal CPX  test   Hyperlipidemia    Nocturia    Osteopenia    Pure hypercholesterolemia 06/12/2009   Thoracic ascending aortic aneurysm [facility])    followed by dr Skeet Latch---  last CT 12-29-2016 ,  4.5cm   Ulcerative colitis, universal ([place])    GI-- [name]--  dx 1990s  (per note mild Mayo Score 1)   Wears glasses     Past Surgical History:  Procedure Laterality Date   CARDIAC CATHETERIZATION  03/26/2010    dr Dian Queen   single vessel CAD w/ patent mLAD stent w/ 10% in-stent restenosis w/ mild to moderate disease in the remainder LAD w/ no lesions that appearedto be flow-limitinf/  normal LVFSF, ef 55%   CARDIOVASCULAR STRESS TEST  06-30-2016   [name]   Low risk nuclear study w/ no evidence ishcemia/  normal LV function and wall motion,  nuclear stress ef 55%   COLONOSCOPY  last one 10-27-2016   CORONARY ANGIOPLASTY WITH STENT PLACEMENT  07-25-2002   [name]   PTCA w/  DES x1 to mLAD  INGUINAL HERNIA REPAIR Left 2006   INGUINAL HERNIA REPAIR Right 07/03/2017   Procedure: OPEN REPAIR OF RIGHT INGUINAL HERNIA WITH MESH;  Surgeon: Kinsinger, Arta Bruce, MD;  Location: [place];  Service: General;  Laterality: Right;   INSERTION OF MESH Right 07/03/2017   Procedure: INSERTION OF MESH;  Surgeon: Kinsinger, Arta Bruce, MD;  Location: [place];  Service: General;  Laterality: Right;   LEFT HEART CATH AND CORONARY ANGIOGRAPHY N/A 09/11/2020   Procedure: LEFT HEART CATH AND CORONARY ANGIOGRAPHY;  Surgeon: Jettie Booze, MD;  Location: [place] [facility];  Service: Cardiovascular;  Laterality: N/A;   SEPTOPLASTY  1987   w/ RHINOPLASTY   THORACIC AORTOGRAM N/A 09/11/2020   Procedure: THORACIC AORTOGRAM;  Surgeon: Jettie Booze, MD;  Location: [place] [facility];  Service: Cardiovascular;  Laterality: N/A;   TONSILLECTOMY  1959   TRANSTHORACIC ECHOCARDIOGRAM  06-30-2016     dr Alison Murray 55-60%/  trivial AR/ mild dilated ascending aorta, 42m/   trivial MR/  mild PR and TR     Family History  Problem Relation Age of Onset   Diabetes Mother    Heart disease Mother    Lung disease Mother        ILD   Rheum arthritis Mother    CAD Mother    Heart disease Father [phone]      MI   Heart attack Father    Asthma Sister    Colon cancer Neg Hx    Esophageal cancer Neg Hx    Pancreatic cancer Neg Hx    Kidney disease Neg Hx    Liver disease Neg Hx    Rectal cancer Neg Hx    Stomach cancer Neg Hx     Social History   Socioeconomic History   Marital status: Married    Spouse name: Not on file   Number of children: 1   Years of education: Not on file   Highest education level: Not on file  Occupational History   Occupation: retired    EFish farm manager RETIRED  Tobacco Use   Smoking status: Never   Smokeless tobacco: Never   Tobacco comments:    Married, 1 stepson, 5 g-kids. Retired aEmergency planning/management officer now vEnvironmental health practitionerUse: Never used  Substance and Sexual Activity   Alcohol use: Yes    Comment: occasional wine   Drug use: No   Sexual activity: Yes    Partners: Female  Other Topics Concern   Not on file  Social History Narrative   Exercising regularly: walks at least 5 miles a day, 6 days a week   Social Determinants of HRadio broadcast assistantStrain: Not on file  Food Insecurity: Not on file  Transportation Needs: Not on file  Physical Activity: Not on file  Stress: Not on file  Social Connections: Not on file  Intimate Partner Violence: Not on file      Review of systems: All other review of systems negative except as mentioned in the HPI.   Physical Exam: Vitals:   10/07/21 0944  BP: 122/82  Pulse: (Abnormal) 59  SpO2: 98%   Body mass index is 22.02 kg/m. Gen:      No acute distress HEENT:  sclera anicteric Neuro: alert and oriented x 3 Psych: normal mood and affect  Data Reviewed:  Reviewed labs, radiology imaging, old records and pertinent past GI work up   Assessment  and  Plan/Recommendations:  71 year old very pleasant gentleman with chronic ulcerative colitis, with acute flare of UC symptoms, tenesmus rectal bleeding with mucus.  No significant improvement of his symptoms on prednisone 40 mg daily.   He is currently on induction dose of Remicade 5 mg/kg Flexible sigmoidoscopy with evidence of active proctitis  He is unable to retain mesalamine enema for greater than 2 hours, will plan to increase mesalamine to twice daily for 1 to 2 weeks and then taper it back to once daily for additional 2 to 4 weeks or resolution of rectal bleeding   Continue mesalamine /Lialda 4.8 g daily   Follow-up fecal calprotectin level in 2 to 3 weeks to monitor response and disease activity   The patient was provided an opportunity to ask questions and all were answered. The patient agreed with the plan and demonstrated an understanding of the instructions.  Damaris Hippo , MD    CC: Binnie Rail, MD

## 2021-10-07 NOTE — Patient Instructions (Signed)
Your provider has requested that you go to the basement level for lab work before leaving today. Press "B" on the elevator. The lab is located at the first door on the left as you exit the elevator.   We have sent the following medications to your pharmacy for you to pick up at your convenience: Mesalamine and Rowasa   _______________________________________________________  If you are age 69 or older, your body mass index should be between 23-30. Your Body mass index is 22.02 kg/m. If this is out of the aforementioned range listed, please consider follow up with your Primary Care Provider.  If you are age 61 or younger, your body mass index should be between 19-25. Your Body mass index is 22.02 kg/m. If this is out of the aformentioned range listed, please consider follow up with your Primary Care Provider.   ________________________________________________________  The [place] [facility] providers would like to encourage you to use [facility] to communicate with providers for non-urgent requests or questions.  Due to long hold times on the telephone, sending your provider a message by [facility] may be a faster and more efficient way to get a response.  Please allow 48 business hours for a response.  Please remember that this is for non-urgent requests.  _______________________________________________________

## 2021-10-11 ENCOUNTER — Ambulatory Visit (INDEPENDENT_AMBULATORY_CARE_PROVIDER_SITE_OTHER): Payer: Medicare Other

## 2021-10-11 ENCOUNTER — Encounter: Payer: Self-pay | Admitting: Gastroenterology

## 2021-10-11 VITALS — BP 131/81 | HR 53 | Temp 98.3°F | Resp 16 | Ht 68.0 in | Wt 147.6 lb

## 2021-10-11 DIAGNOSIS — K51011 Ulcerative (chronic) pancolitis with rectal bleeding: Secondary | ICD-10-CM

## 2021-10-11 MED ORDER — SODIUM CHLORIDE 0.9 % IV SOLN
5.0000 mg/kg | Freq: Once | INTRAVENOUS | Status: AC
  Administered 2021-10-11: 300 mg via INTRAVENOUS
  Filled 2021-10-11: qty 30

## 2021-10-11 MED ORDER — DIPHENHYDRAMINE HCL 25 MG PO CAPS
25.0000 mg | ORAL_CAPSULE | Freq: Once | ORAL | Status: AC
  Administered 2021-10-11: 25 mg via ORAL
  Filled 2021-10-11: qty 1

## 2021-10-11 MED ORDER — ACETAMINOPHEN 325 MG PO TABS
650.0000 mg | ORAL_TABLET | Freq: Once | ORAL | Status: AC
  Administered 2021-10-11: 650 mg via ORAL
  Filled 2021-10-11: qty 2

## 2021-10-11 MED ORDER — METHYLPREDNISOLONE SODIUM SUCC 40 MG IJ SOLR
40.0000 mg | Freq: Once | INTRAMUSCULAR | Status: AC
  Administered 2021-10-11: 40 mg via INTRAVENOUS
  Filled 2021-10-11: qty 1

## 2021-10-11 NOTE — Progress Notes (Signed)
Diagnosis: Ulcerative Pancolitis  Provider:  Marshell Garfinkel MD  Procedure: Infusion  IV Type: Peripheral, IV Location: L Antecubital  Remicade (Infliximab), Dose: 300 mg  Infusion Start Time: 8579  Infusion Stop Time: 1150  Post Infusion IV Care: Peripheral IV Discontinued  Discharge: Condition: Good, Destination: Home . AVS provided to patient.   Performed by:  Arnoldo Morale, RN

## 2021-10-25 ENCOUNTER — Other Ambulatory Visit: Payer: Medicare Other

## 2021-10-25 DIAGNOSIS — K51211 Ulcerative (chronic) proctitis with rectal bleeding: Secondary | ICD-10-CM | POA: Diagnosis not present

## 2021-10-31 LAB — CALPROTECTIN, FECAL: Calprotectin, Fecal: 608 ug/g — ABNORMAL HIGH (ref 0–120)

## 2021-11-04 ENCOUNTER — Ambulatory Visit (INDEPENDENT_AMBULATORY_CARE_PROVIDER_SITE_OTHER): Payer: Medicare Other | Admitting: Gastroenterology

## 2021-11-04 ENCOUNTER — Other Ambulatory Visit: Payer: Medicare Other

## 2021-11-04 ENCOUNTER — Encounter: Payer: Self-pay | Admitting: Gastroenterology

## 2021-11-04 VITALS — BP 118/70 | HR 56 | Ht 68.0 in | Wt 149.5 lb

## 2021-11-04 DIAGNOSIS — K51211 Ulcerative (chronic) proctitis with rectal bleeding: Secondary | ICD-10-CM

## 2021-11-04 DIAGNOSIS — I2510 Atherosclerotic heart disease of native coronary artery without angina pectoris: Secondary | ICD-10-CM

## 2021-11-04 DIAGNOSIS — K51911 Ulcerative colitis, unspecified with rectal bleeding: Secondary | ICD-10-CM | POA: Diagnosis not present

## 2021-11-04 MED ORDER — MESALAMINE 4 G RE ENEM: 4.0000 g | ENEMA | Freq: Every day | RECTAL | 0 refills | Status: DC

## 2021-11-04 NOTE — Progress Notes (Signed)
Joseph Booker    [id number]    1950/01/25  Primary Care Physician:Burns, Claudina Lick, MD  Referring Physician: Binnie Rail, MD [place],  [place] [address]   Chief complaint: Ulcerative proctitis  HPI: 71 year old very pleasant gentleman with history of chronic ulcerative colitis, was in clinical remission on daily mesalamine for almost past 17 years with symptoms of acute severe UC flare since June 2023   He did not have any improvement on prednisone 40 mg daily.  GI stool pathogen panel and C. difficile negative X 2   He is currently on infliximab Random drug level for infliximab on 09/28/2021 was 39 with undetectable antibodies  His symptoms are slowly improving, he continues to have intermittent flareups, has noticed some episodes after he ate few extra desserts otherwise denies nocturnal symptoms or persistent rectal bleeding   CMV PCR negative   CRP 1.53 and sed rate 8 on September 16, 2021   Fecal calprotectin 550 on September 21, 2021   Flexible sigmoidoscopy 09/28/2021 - The perianal and digital rectal examinations were normal. - A continuous area of ulcerated mucosa with stigmata of recent bleeding was present in the rectum extending to rectosigmoid. Biopsies were taken with a cold forceps for histology. - Normal mucosa was found sigmoid colon to visualized extent in the descending colon. Retroflexion not performed in the rectum due to proctitis.   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     He is tolerating mesalamine enema but is unable to retain it for more than 2 hours at a time.  Initially when it was tried few months ago he had severe reaction to it but does not have it anymore   He had a mild flare in April 2022, he was treated  with tapered dose of budesonide.  After taking it for 2 months he was experiencing side effects and has since stopped taking it. Prior to the episode of UC flare in April, he was on chronic antibiotics for ocular rosacea.     Colonoscopy April 17, 2020 - The perianal and digital rectal examinations were normal. - Inflammation characterized by altered vascularity, congestion (edema), erythema and scarring was found as medium patches surrounded by normal mucosa in the rectum, in the descending colon, in the splenic flexure, in the transverse colon, in the ascending colon and at the cecum. This was mild in severity, and when compared to previous examinations, the findings are worsened. Biopsies were taken with a cold forceps for histology. - Non-bleeding internal hemorrhoids were found during retroflexion. The hemorrhoids were medium-sized.   1. Surgical [P], right colon - MILDLY ACTIVE CHRONIC COLITIS, CONSISTENT WITH PATIENT'S CLINICAL HISTORY OF ULCERATIVE COLITIS - NEGATIVE FOR GRANULOMAS OR DYSPLASIA 2. Surgical [P], left colon - COLONIC MUCOSA WITH NO SPECIFIC HISTOPATHOLOGIC CHANGES - NEGATIVE FOR ACUTE INFLAMMATION, FEATURES OF CHRONICITY, GRANULOMAS OR DYSPLASIA       Outpatient Encounter Medications as of 11/04/2021  Medication Sig   aspirin 81 MG tablet Take 81 mg by mouth daily.   Calcium Carb-Cholecalciferol (CALCIUM+D3) 600-20 MG-MCG TABS Take 1 tablet by mouth daily.   clopidogrel (PLAVIX) 75 MG tablet Take 1 tablet by mouth once daily (Patient taking differently: Take 75 mg by mouth  daily.)   Coenzyme Q10 (CO Q-10) 300 MG CAPS Take 300 mg by mouth every morning.   hydrocortisone (ANUSOL-HC) 25 MG suppository Use 1 suppository every night for 12 days   inFLIXimab (REMICADE IV) Inject 300 mg into the vein. AS DIRECTED   latanoprost (XALATAN) 0.005 % ophthalmic solution Place 1 drop into the left eye daily.   mesalamine (LIALDA) 1.2 g EC tablet Take 4 tablets (4.8 g total) by  mouth daily with breakfast.   mesalamine (ROWASA) 4 g enema Place 60 mLs (4 g total) rectally in the morning and at bedtime.   Multiple Vitamins-Minerals (MULTIVITAMIN WITH MINERALS) tablet Take 1 tablet by mouth daily.   ondansetron (ZOFRAN-ODT) 4 MG disintegrating tablet Take 1 tablet (4 mg total) by mouth daily as needed for up to 10 doses for nausea or vomiting.   polyethylene glycol (MIRALAX / GLYCOLAX) 17 g packet Take 17 g by mouth daily.   REPATHA SURECLICK 833 MG/ML SOAJ INJECT 140 MG INTO THE SKIN EVERY 14 DAYS (Patient taking differently: Inject 140 mg into the skin every 14 (fourteen) days. Friday)   timolol (TIMOPTIC) 0.5 % ophthalmic solution Place 1 drop into both eyes 2 (two) times daily.    Wheat Dextrin (BENEFIBER PO) Take 1 Scoop by mouth 3 (three) times daily. Mix 1 scoop with 8 ounce beverage and drink three times a day   No facility-administered encounter medications on file as of 11/04/2021.    Allergies as of 11/04/2021 - Review Complete 10/11/2021  Allergen Reaction Noted   Atorvastatin Other (See Comments) 04/04/2019    Past Medical History:  Diagnosis Date   Allergic rhinitis    Allergy    Anxiety state, unspecified    Ascending aortic aneurysm ([place]) 05/15/2020   Bradycardia 05/03/2021   CAD (coronary artery disease)    [name] / nuclear 2005, excellent tolerance no scar or ischemia, EF 51% / nuclear, February, 2012, no scar or ischemia / catheterization March 26, 2010.. 10% in-stent restenosis, minimal other nonobstructive coronary disease, ejection fraction 55%., excellent result; ETT 9/13: ex 13:14, no CP, no ischemic ECG changes   Cancer (HCC)    basal cell carcinoma x2 on face   Cataract    surgery to both eyes   Elevated blood pressure reading 05/15/2020   Glaucoma, left eye    left eye   History of exercise intolerance 05/23/2013   cardiopulmonary exercise test-- normal functional capacity when compared to matched sedentary norms,  borderline ventilatory limitation with the patient's exercise tidal volume reaching 90% of the measured inspiratory capacity ( pt reports no dyspnea), did not appear to be circulatory limitation, normal CPX test   Hyperlipidemia    Nocturia    Osteopenia    Pure hypercholesterolemia 06/12/2009   Thoracic ascending aortic aneurysm [facility])    followed by dr Skeet Latch---  last CT 12-29-2016 ,  4.5cm   Ulcerative colitis, universal ([place])    GI-- [name]--  dx 1990s  (per note mild Mayo Score 1)   Wears glasses     Past Surgical History:  Procedure Laterality Date   CARDIAC CATHETERIZATION  03/26/2010    dr Dian Queen   single vessel CAD w/ patent mLAD stent w/ 10% in-stent restenosis w/ mild to moderate disease in the remainder LAD w/ no lesions that appearedto be flow-limitinf/  normal LVFSF, ef 55%   CARDIOVASCULAR STRESS TEST  06-30-2016   [name]   Low risk nuclear study w/ no evidence ishcemia/  normal LV  function and wall motion,  nuclear stress ef 55%   COLONOSCOPY  last one 10-27-2016   CORONARY ANGIOPLASTY WITH STENT PLACEMENT  07-25-2002   [name]   PTCA w/  DES x1 to [place] Left 2006   INGUINAL HERNIA REPAIR Right 07/03/2017   Procedure: OPEN REPAIR OF RIGHT INGUINAL HERNIA WITH MESH;  Surgeon: Kinsinger, Arta Bruce, MD;  Location: [place];  Service: General;  Laterality: Right;   INSERTION OF MESH Right 07/03/2017   Procedure: INSERTION OF MESH;  Surgeon: Kieth Brightly Arta Bruce, MD;  Location: [place];  Service: General;  Laterality: Right;   LEFT HEART CATH AND CORONARY ANGIOGRAPHY N/A 09/11/2020   Procedure: LEFT HEART CATH AND CORONARY ANGIOGRAPHY;  Surgeon: Jettie Booze, MD;  Location: [place] [facility];  Service: Cardiovascular;  Laterality: N/A;   SEPTOPLASTY  1987   w/ RHINOPLASTY   THORACIC AORTOGRAM N/A 09/11/2020   Procedure: THORACIC AORTOGRAM;  Surgeon: Jettie Booze, MD;  Location: [place] [facility];  Service: Cardiovascular;  Laterality: N/A;   TONSILLECTOMY  1959   TRANSTHORACIC ECHOCARDIOGRAM  06-30-2016     dr Alison Murray 55-60%/  trivial AR/ mild dilated ascending aorta, 39m/  trivial MR/  mild PR and TR     Family History  Problem Relation Age of Onset   Diabetes Mother    Heart disease Mother    Lung disease Mother        ILD   Rheum arthritis Mother    CAD Mother    Heart disease Father 573      MI   Heart attack Father    Asthma Sister    Colon cancer Neg Hx    Esophageal cancer Neg Hx    Pancreatic cancer Neg Hx    Kidney disease Neg Hx    Liver disease Neg Hx    Rectal cancer Neg Hx    Stomach cancer Neg Hx     Social History   Socioeconomic History   Marital status: Married    Spouse name: Not on file   Number of children: 1   Years of education: Not on file   Highest education level: Not on file  Occupational History   Occupation: retired    EFish farm manager RETIRED  Tobacco Use   Smoking status: Never   Smokeless tobacco: Never   Tobacco comments:    Married, 1 stepson, 5 g-kids. Retired aEmergency planning/management officer now vEnvironmental health practitionerUse: Never used  Substance and Sexual Activity   Alcohol use: Yes    Comment: occasional wine   Drug use: No   Sexual activity: Yes    Partners: Female  Other Topics Concern   Not on file  Social History Narrative   Exercising regularly: walks at least 5 miles a day, 6 days a week   Social Determinants of HRadio broadcast assistantStrain: Not on file  Food Insecurity: Not on file  Transportation Needs: Not on file  Physical Activity: Not on file  Stress: Not on file  Social Connections: Not on file  Intimate Partner Violence: Not on file      Review of systems: All other review of systems negative except as mentioned in the HPI.   Physical Exam: There were no vitals filed for this visit. There is no height or weight on file to calculate BMI. Gen:      No  acute distress HEENT:   sclera anicteric Abd:      soft, non-tender; no palpable masses, no distension Ext:    No edema Neuro: alert and oriented x 3 Psych: normal mood and affect  Data Reviewed:  Reviewed labs, radiology imaging, old records and pertinent past GI work up   Assessment and Plan/Recommendations:  71 year old very pleasant gentleman with chronic ulcerative colitis, with acute flare of UC symptoms, tenesmus rectal bleeding with mucus.  No significant improvement of his symptoms on prednisone 40 mg daily.   He is currently on Remicade 5 mg/kg with slow improvement of symptoms Flexible sigmoidoscopy with evidence of active proctitis   Advised patient to wean off mesalamine enema in the next 1 to 2 weeks Continue mesalamine /Lialda 4.8 g daily   Follow-up labs in 4 weeks and return to office in 6 to 8 weeks   The patient was provided an opportunity to ask questions and all were answered. The patient agreed with the plan and demonstrated an understanding of the instructions.  Damaris Hippo , MD    CC: Binnie Rail, MD

## 2021-11-04 NOTE — Patient Instructions (Addendum)
_______________________________________________________  If you are age 69 or older, your body mass index should be between 23-30. Your Body mass index is 22.73 kg/m. If this is out of the aforementioned range listed, please consider follow up with your Primary Care Provider. ________________________________________________________  The [facility] providers would like to encourage you to use [facility] to communicate with providers for non-urgent requests or questions.  Due to long hold times on the telephone, sending your provider a message by [facility] may be a faster and more efficient way to get a response.  Please allow 48 business hours for a response.  Please remember that this is for non-urgent requests.  _______________________________________________________  Your provider has requested that you go to the basement level for lab work on 12-05-21. Press "B" on the elevator. The lab is located at the first door on the left as you exit the elevator.  Due to recent changes in healthcare laws, you may see the results of your imaging and laboratory studies on MyChart before your provider has had a chance to review them.  We understand that in some cases there may be results that are confusing or concerning to you. Not all laboratory results come back in the same time frame and the provider may be waiting for multiple results in order to interpret others.  Please give [place] 48 hours in order for your provider to thoroughly review all the results before contacting the office for clarification of your results.   CONTINUE: Lialda  We have sent the following medications to your pharmacy for you to pick up at your convenience:  START: Mesalamine enema every night at bedtime for 2 weeks.  You are scheduled to follow up on 12-29-21 at 11:20am.  Thank you for entrusting me with your care and choosing [facility].  [name]

## 2021-11-15 DIAGNOSIS — R3912 Poor urinary stream: Secondary | ICD-10-CM | POA: Diagnosis not present

## 2021-11-15 DIAGNOSIS — N401 Enlarged prostate with lower urinary tract symptoms: Secondary | ICD-10-CM | POA: Diagnosis not present

## 2021-11-15 DIAGNOSIS — N210 Calculus in bladder: Secondary | ICD-10-CM | POA: Diagnosis not present

## 2021-11-15 DIAGNOSIS — N201 Calculus of ureter: Secondary | ICD-10-CM | POA: Diagnosis not present

## 2021-12-03 ENCOUNTER — Encounter: Payer: Self-pay | Admitting: Gastroenterology

## 2021-12-03 ENCOUNTER — Other Ambulatory Visit (INDEPENDENT_AMBULATORY_CARE_PROVIDER_SITE_OTHER): Payer: Medicare Other

## 2021-12-03 DIAGNOSIS — K51911 Ulcerative colitis, unspecified with rectal bleeding: Secondary | ICD-10-CM

## 2021-12-03 LAB — CBC WITH DIFFERENTIAL/PLATELET
Basophils Absolute: 0.1 10*3/uL (ref 0.0–0.1)
Basophils Relative: 1.4 % (ref 0.0–3.0)
Eosinophils Absolute: 0.3 10*3/uL (ref 0.0–0.7)
Eosinophils Relative: 5.2 % — ABNORMAL HIGH (ref 0.0–5.0)
HCT: 45.1 % (ref 39.0–52.0)
Hemoglobin: 15.5 g/dL (ref 13.0–17.0)
Lymphocytes Relative: 32.2 % (ref 12.0–46.0)
Lymphs Abs: 1.8 10*3/uL (ref 0.7–4.0)
MCHC: 34.3 g/dL (ref 30.0–36.0)
MCV: 97.5 fl (ref 78.0–100.0)
Monocytes Absolute: 0.6 10*3/uL (ref 0.1–1.0)
Monocytes Relative: 9.9 % (ref 3.0–12.0)
Neutro Abs: 2.9 10*3/uL (ref 1.4–7.7)
Neutrophils Relative %: 51.3 % (ref 43.0–77.0)
Platelets: 176 10*3/uL (ref 150.0–400.0)
RBC: 4.62 Mil/uL (ref 4.22–5.81)
RDW: 12.5 % (ref 11.5–15.5)
WBC: 5.6 10*3/uL (ref 4.0–10.5)

## 2021-12-03 LAB — COMPREHENSIVE METABOLIC PANEL
ALT: 13 U/L (ref 0–53)
AST: 23 U/L (ref 0–37)
Albumin: 4.1 g/dL (ref 3.5–5.2)
Alkaline Phosphatase: 50 U/L (ref 39–117)
BUN: 22 mg/dL (ref 6–23)
CO2: 29 mEq/L (ref 19–32)
Calcium: 8.8 mg/dL (ref 8.4–10.5)
Chloride: 105 mEq/L (ref 96–112)
Creatinine, Ser: 1.16 mg/dL (ref 0.40–1.50)
GFR: 64.19 mL/min (ref >60.00)
Glucose, Bld: 63 mg/dL — ABNORMAL LOW (ref 70–99)
Potassium: 3.6 mEq/L (ref 3.5–5.1)
Sodium: 140 mEq/L (ref 135–145)
Total Bilirubin: 1 mg/dL (ref 0.2–1.2)
Total Protein: 7.1 g/dL (ref 6.0–8.3)

## 2021-12-03 LAB — C-REACTIVE PROTEIN: CRP: <1 mg/dL (ref 0.5–20.0)

## 2021-12-03 LAB — SEDIMENTATION RATE: Sed Rate: 12 mm/hr (ref 0–20)

## 2021-12-06 ENCOUNTER — Ambulatory Visit (INDEPENDENT_AMBULATORY_CARE_PROVIDER_SITE_OTHER): Payer: Medicare Other

## 2021-12-06 VITALS — BP 125/81 | HR 48 | Temp 97.7°F | Resp 18 | Ht 68.0 in | Wt 147.4 lb

## 2021-12-06 DIAGNOSIS — K51011 Ulcerative (chronic) pancolitis with rectal bleeding: Secondary | ICD-10-CM

## 2021-12-06 MED ORDER — ACETAMINOPHEN 325 MG PO TABS
650.0000 mg | ORAL_TABLET | Freq: Once | ORAL | Status: AC
  Administered 2021-12-06: 650 mg via ORAL
  Filled 2021-12-06: qty 2

## 2021-12-06 MED ORDER — DIPHENHYDRAMINE HCL 25 MG PO CAPS
25.0000 mg | ORAL_CAPSULE | Freq: Once | ORAL | Status: AC
  Administered 2021-12-06: 25 mg via ORAL
  Filled 2021-12-06: qty 1

## 2021-12-06 MED ORDER — SODIUM CHLORIDE 0.9 % IV SOLN
5.0000 mg/kg | Freq: Once | INTRAVENOUS | Status: AC
  Administered 2021-12-06: 300 mg via INTRAVENOUS
  Filled 2021-12-06: qty 30

## 2021-12-06 MED ORDER — METHYLPREDNISOLONE SODIUM SUCC 40 MG IJ SOLR
40.0000 mg | Freq: Once | INTRAMUSCULAR | Status: AC
  Administered 2021-12-06: 40 mg via INTRAVENOUS
  Filled 2021-12-06: qty 1

## 2021-12-06 NOTE — Progress Notes (Signed)
Diagnosis: Ulcerative Pancolitis  Provider:  Marshell Garfinkel MD  Procedure: Infusion  IV Type: Peripheral, IV Location: L Forearm  Remicade (Infliximab), Dose: 300 mg  Infusion Start Time: 0221  Infusion Stop Time: 7981  Post Infusion IV Care: Peripheral IV Discontinued  Discharge: Condition: Good, Destination: Home . AVS provided to patient.   Performed by:  Arnoldo Morale, RN

## 2021-12-12 LAB — SERIAL MONITORING

## 2021-12-13 ENCOUNTER — Encounter: Payer: Self-pay | Admitting: Gastroenterology

## 2021-12-13 ENCOUNTER — Other Ambulatory Visit: Payer: Self-pay

## 2021-12-13 ENCOUNTER — Telehealth: Payer: Self-pay | Admitting: Pharmacy Technician

## 2021-12-13 LAB — CALPROTECTIN, FECAL: Calprotectin, Fecal: 490 ug/g — ABNORMAL HIGH (ref 0–120)

## 2021-12-13 LAB — INFLIXIMAB+AB (SERIAL MONITOR)
Anti-Infliximab Antibody: 48 ng/mL
Infliximab Drug Level: 10 ug/mL

## 2021-12-13 NOTE — Telephone Encounter (Addendum)
Auth Submission: NO AUTH NEEDED Payer: medicare a/b & aarp Medication & CPT/J Code(s) submitted: Remicade (Infliximab) J1745 Route of submission (phone, fax, portal):  Phone # Fax # Auth type: Buy/Bill Units/visits requested: 50m/kg (increased) Reference number:  Approval from: 12/13/21 to 01/16/22   Medicare and supplement coverage reviewed and auth approval extended to 01/17/23

## 2021-12-14 DIAGNOSIS — N210 Calculus in bladder: Secondary | ICD-10-CM | POA: Diagnosis not present

## 2021-12-14 DIAGNOSIS — H401111 Primary open-angle glaucoma, right eye, mild stage: Secondary | ICD-10-CM | POA: Diagnosis not present

## 2021-12-14 DIAGNOSIS — H401123 Primary open-angle glaucoma, left eye, severe stage: Secondary | ICD-10-CM | POA: Diagnosis not present

## 2021-12-14 DIAGNOSIS — N201 Calculus of ureter: Secondary | ICD-10-CM | POA: Diagnosis not present

## 2021-12-14 DIAGNOSIS — N400 Enlarged prostate without lower urinary tract symptoms: Secondary | ICD-10-CM | POA: Diagnosis not present

## 2021-12-15 DIAGNOSIS — L820 Inflamed seborrheic keratosis: Secondary | ICD-10-CM | POA: Diagnosis not present

## 2021-12-23 ENCOUNTER — Ambulatory Visit (INDEPENDENT_AMBULATORY_CARE_PROVIDER_SITE_OTHER): Payer: Medicare Other

## 2021-12-23 DIAGNOSIS — I340 Nonrheumatic mitral (valve) insufficiency: Secondary | ICD-10-CM

## 2021-12-23 DIAGNOSIS — I7121 Aneurysm of the ascending aorta, without rupture: Secondary | ICD-10-CM

## 2021-12-23 DIAGNOSIS — E7800 Pure hypercholesterolemia, unspecified: Secondary | ICD-10-CM | POA: Diagnosis not present

## 2021-12-23 LAB — COMPREHENSIVE METABOLIC PANEL
ALT: 15 IU/L (ref 0–44)
AST: 26 IU/L (ref 0–40)
Albumin/Globulin Ratio: 1.6 (ref 1.2–2.2)
Albumin: 4.3 g/dL (ref 3.9–4.9)
Alkaline Phosphatase: 60 IU/L (ref 44–121)
BUN/Creatinine Ratio: 17 (ref 10–24)
BUN: 18 mg/dL (ref 8–27)
Bilirubin Total: 0.8 mg/dL (ref 0.0–1.2)
CO2: 25 mmol/L (ref 20–29)
Calcium: 9.5 mg/dL (ref 8.6–10.2)
Chloride: 104 mmol/L (ref 96–106)
Creatinine, Ser: 1.05 mg/dL (ref 0.76–1.27)
Globulin, Total: 2.7 g/dL (ref 1.5–4.5)
Glucose: 90 mg/dL (ref 70–99)
Potassium: 4.7 mmol/L (ref 3.5–5.2)
Sodium: 141 mmol/L (ref 134–144)
Total Protein: 7 g/dL (ref 6.0–8.5)
eGFR: 77 mL/min/{1.73_m2} (ref >59)

## 2021-12-23 LAB — ECHOCARDIOGRAM COMPLETE
AV Vena cont: 0.47 cm
Area-P 1/2: 2.76 cm2
MV M vel: 4.61 m/s
MV Peak grad: 85 mmHg
P 1/2 time: 657 msec
S' Lateral: 2.91 cm

## 2021-12-23 LAB — LIPID PANEL
Chol/HDL Ratio: 2.6 ratio (ref 0.0–5.0)
Cholesterol, Total: 108 mg/dL (ref 100–199)
HDL: 42 mg/dL (ref >39)
LDL Chol Calc (NIH): 44 mg/dL (ref 0–99)
Triglycerides: 126 mg/dL (ref 0–149)
VLDL Cholesterol Cal: 22 mg/dL (ref 5–40)

## 2021-12-27 ENCOUNTER — Ambulatory Visit (INDEPENDENT_AMBULATORY_CARE_PROVIDER_SITE_OTHER): Payer: Medicare Other | Admitting: Family

## 2021-12-27 ENCOUNTER — Encounter (HOSPITAL_BASED_OUTPATIENT_CLINIC_OR_DEPARTMENT_OTHER): Payer: Self-pay | Admitting: Family

## 2021-12-27 VITALS — BP 124/68 | HR 53 | Ht 68.0 in | Wt 150.5 lb

## 2021-12-27 DIAGNOSIS — R001 Bradycardia, unspecified: Secondary | ICD-10-CM

## 2021-12-27 DIAGNOSIS — I25118 Atherosclerotic heart disease of native coronary artery with other forms of angina pectoris: Secondary | ICD-10-CM

## 2021-12-27 DIAGNOSIS — I7121 Aneurysm of the ascending aorta, without rupture: Secondary | ICD-10-CM

## 2021-12-27 DIAGNOSIS — I77810 Thoracic aortic ectasia: Secondary | ICD-10-CM

## 2021-12-27 DIAGNOSIS — E785 Hyperlipidemia, unspecified: Secondary | ICD-10-CM | POA: Diagnosis not present

## 2021-12-27 DIAGNOSIS — I493 Ventricular premature depolarization: Secondary | ICD-10-CM | POA: Diagnosis not present

## 2021-12-27 NOTE — Patient Instructions (Addendum)
Medication Instructions:  Continue your current medications.  *If you need a refill on your cardiac medications before your next appointment, please call your pharmacy*   Lab Work/Testing/Procedures:  Your echo showed your  aortic root 25m (previous 423m and ascending aorta 462mprevious 88m69mKeep blood pressure at goal of less than 130/80 to prevent progression.   Your physician has requested that you have an echocardiogram 12/2022. Echocardiography is a painless test that uses sound waves to create images of your heart. It provides your doctor with information about the size and shape of your heart and how well your heart's chambers and valves are working. This procedure takes approximately one hour. There are no restrictions for this procedure. Please do NOT wear cologne, perfume, aftershave, or lotions (deodorant is allowed). Please arrive 15 minutes prior to your appointment time.    Follow-Up: [facility] and your health needs are our priority.  As part of our continuing mission to provide you with exceptional heart care, we have created designated Provider Care Teams.  These Care Teams include your primary Cardiologist (physician) and Advanced Practice Providers (APPs -  Physician Assistants and Nurse Practitioners) who all work together to provide you with the care you need, when you need it.  We recommend signing up for the patient portal called "MyChart".  Sign up information is provided on this After Visit Summary.  MyChart is used to connect with patients for Virtual Visits (Telemedicine).  Patients are able to view lab/test results, encounter notes, upcoming appointments, etc.  Non-urgent messages can be sent to your provider as well.   To learn more about what you can do with MyChart, go to [email or web address] Your next appointment:   6 month(s)  The format for your next appointment:   In Person  Provider:   TiffSkeet Latch    Other  Instructions  To prevent or reduce lower extremity swelling: Eat a low salt diet. Salt makes the body hold onto extra fluid which causes swelling. Sit with legs elevated. For example, in the recliner or on an [place] knee-high compression stockings during the daytime. Ones labeled 15-20 mmHg provide good compression. Call us iKoreaswelling worsens and you feel you need a fluid pill.   Information About Your Aneurysm  One of your tests has shown an aneurysm of your ascending aorta. The word "aneurysm" refers to a bulge in an artery (blood vessel). Most people think of them in the context of an emergency, but yours was found incidentally. At this point there is nothing you need to do from a procedure standpoint, but there are some important things to keep in mind for day-to-day life.  Mainstays of therapy for aneurysms include very good blood pressure control, healthy lifestyle, and avoiding tobacco products and street drugs. Research has raised concern that antibiotics in the fluoroquinolone class could be associated with increased risk of having an aneurysm develop or tear. This includes medicines that end in "floxacin," like Cipro or Levaquin. Make sure to discuss this information with other healthcare providers if you require antibiotics.  Since aneurysms can run in families, you should discuss your diagnosis with first degree relatives as they may need to be screened for this. Regular mild-moderate physical exercise is important, but avoid heavy lifting/weight lifting over 30lbs, chopping wood, shoveling snow or digging heavy earth with a shovel. It is best to avoid activities that cause grunting or straining (medically referred to as a "Valsalva maneuver"). This  happens when a person bears down against a closed throat to increase the strength of arm or abdominal muscles. There's often a tendency to do this when lifting heavy weights, doing sit-ups, push-ups or chin-ups, etc., but it may be  harmful.  This is a finding I would expect to be monitored periodically by your cardiology team. Most unruptured thoracic aortic aneurysms cause no symptoms, so they are often found during exams for other conditions. Contact a health care provider if you develop any discomfort in your upper back, neck, abdomen, trouble swallowing, cough or hoarseness, or unexplained weight loss. Get help right away if you develop severe pain in your upper back or abdomen that may move into your chest and arms, or any other concerning symptoms such as shortness of breath or fever.

## 2021-12-27 NOTE — Progress Notes (Signed)
Office Visit    Patient Name: Staci Carver Date of Encounter: 12/27/2021  PCP:  Binnie Rail, MD   [place]  Cardiologist:  Skeet Latch, MD  Advanced Practice Provider:  No care team member to display Electrophysiologist:  None   Chief Complaint    Keyontae Huckeby is a 71 y.o. male  presents today for CAD follow-up.   Past Medical History    Past Medical History:  Diagnosis Date   Allergic rhinitis    Allergy    Anxiety state, unspecified    Ascending aortic aneurysm ([place]) 05/15/2020   Bradycardia 05/03/2021   CAD (coronary artery disease)    [name] / nuclear 2005, excellent tolerance no scar or ischemia, EF 51% / nuclear, February, 2012, no scar or ischemia / catheterization March 26, 2010.. 10% in-stent restenosis, minimal other nonobstructive coronary disease, ejection fraction 55%., excellent result; ETT 9/13: ex 13:14, no CP, no ischemic ECG changes   Cancer (HCC)    basal cell carcinoma x2 on face   Cataract    surgery to both eyes   Elevated blood pressure reading 05/15/2020   Glaucoma, left eye    left eye   History of exercise intolerance 05/23/2013   cardiopulmonary exercise test-- normal functional capacity when compared to matched sedentary norms, borderline ventilatory limitation with the patient's exercise tidal volume reaching 90% of the measured inspiratory capacity ( pt reports no dyspnea), did not appear to be circulatory limitation, normal CPX test   Hyperlipidemia    Nocturia    Osteopenia    Pure hypercholesterolemia 06/12/2009   Thoracic ascending aortic aneurysm [facility])    followed by dr Skeet Latch---  last CT 12-29-2016 ,  4.5cm   Ulcerative colitis, universal ([place])    GI-- [name]--  dx 1990s  (per note mild Mayo Score 1)   Wears glasses    Past Surgical History:  Procedure Laterality Date   CARDIAC CATHETERIZATION  03/26/2010    dr Dian Queen   single vessel CAD w/ patent mLAD stent w/ 10%  in-stent restenosis w/ mild to moderate disease in the remainder LAD w/ no lesions that appearedto be flow-limitinf/  normal LVFSF, ef 55%   CARDIOVASCULAR STRESS TEST  06-30-2016   [name]   Low risk nuclear study w/ no evidence ishcemia/  normal LV function and wall motion,  nuclear stress ef 55%   COLONOSCOPY  last one 10-27-2016   CORONARY ANGIOPLASTY WITH STENT PLACEMENT  07-25-2002   [name]   PTCA w/  DES x1 to [place] Left 2006   INGUINAL HERNIA REPAIR Right 07/03/2017   Procedure: OPEN REPAIR OF RIGHT INGUINAL HERNIA WITH MESH;  Surgeon: Kinsinger, Arta Bruce, MD;  Location: [place];  Service: General;  Laterality: Right;   INSERTION OF MESH Right 07/03/2017   Procedure: INSERTION OF MESH;  Surgeon: Kieth Brightly Arta Bruce, MD;  Location: [place];  Service: General;  Laterality: Right;   LEFT HEART CATH AND CORONARY ANGIOGRAPHY N/A 09/11/2020   Procedure: LEFT HEART CATH AND CORONARY ANGIOGRAPHY;  Surgeon: Jettie Booze, MD;  Location: [place] CV LAB;  Service: Cardiovascular;  Laterality: N/A;   SEPTOPLASTY  1987   w/ RHINOPLASTY   THORACIC AORTOGRAM N/A 09/11/2020   Procedure: THORACIC AORTOGRAM;  Surgeon: Jettie Booze, MD;  Location: [place] CV LAB;  Service: Cardiovascular;  Laterality: N/A;   TONSILLECTOMY  1959   TRANSTHORACIC ECHOCARDIOGRAM  06-30-2016     dr Alison Murray 55-60%/  trivial AR/ mild dilated ascending aorta, 60m/  trivial MR/  mild PR and TR     Allergies  Allergies  Allergen Reactions   Atorvastatin Other (See Comments)    transaminits Transaminits, causes liver enzymes to go up    History of Present Illness    KDarell Saputois a 71y.o. male with a hx of  CAD s/p PCI of LAD in 2004, HLD, mild ascending aorta aneurysm last seen 05/03/2021  CPX in 2015 which was normal. Longstanding history of non exertional shortness of breath for which he has seen an allergist who  suggested he might have inflammation in his lungs. Lexiscan myoview 06/2016 with LVEF 55% and no ischemia. Previous transaminitis on Atorvastatin and [place]   [place] 04/29/20 with stable ascending aortic aneurysm 4.5 cm.  He was seen 06/12/20 feeling significantly short of breath while  in the process of weaning of budesonide.  He had stress test 06/19/20 with exceptional functional capacity (13:01 mins, 13.6 METs), frequent PVCs, no evidence of ischemia or infarction. Given EF was 48% by stress test, echocardiogram was ordered. Echo 07/17/20 with LVEF by 3D volume 67%, no RWMA, normal LVEDP, RV normal size and function, normal PASP, trivial MR, mild dilation of ascending aorta 451mand aortic root 4058m  Seen in follow up 08/31/20 noting recurrent chest pain. He had frequent PVC by EKG but no St/T wave changes. Cardiac cath pursued 09/11/20 with prox LAD 40% eccentric lesion, prox RCA 25%, mid LAD-2 lesion 30%, previous mid LAD stent widely patent, normal LVEDP, moderately dilated aortic root.   Seen 09/2020 doing well from cardiac perspective.  At follow-up 05/03/2021 noted to be doing well and no changes made.  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  Presents today for follow up. 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  Monitoring BP at home routinely <130/80.  Notes was unable to be able to be active for 4-5 months due to ulcerative colitis. He has started exercising again by walking 3-5 miles at a time without chest pain, dyspnea.   EKGs/Labs/Other Studies Reviewed:   The following studies were reviewed today:  Echo 12/23/21  1. Left ventricular ejection fraction, by estimation, is 55 to 60%. Left  ventricular ejection fraction by 3D  volume is 56 %. The left ventricle has  normal function. The left ventricle has no regional wall motion  abnormalities. There is mild left  ventricular hypertrophy. Left ventricular diastolic parameters are  indeterminate.   2. Right ventricular systolic function is low normal. The right  ventricular size is mildly enlarged. There is normal pulmonary artery  systolic pressure. The estimated right ventricular systolic pressure is  20.17.6Hg.   3. The mitral valve is myxomatous. Mild mitral valve regurgitation.   4. Leaftlets are thickened and myxomatous appearing. Mild regurgitation..  The tricuspid valve is myxomatous.   5. The aortic valve is tricuspid. Aortic valve regurgitation is mild to  moderate. Aortic valve sclerosis/calcification is present, without any  evidence of aortic stenosis.   6. Aortic dilatation noted. There is moderate dilatation of the ascending  aorta, measuring 46 mm. There is mild dilatation of the aortic root,  measuring 44  mm.   7. The inferior vena cava is normal in size with greater than 50%  respiratory variability, suggesting right atrial pressure of 3 mmHg.   Comparison(s): 07/17/2020: LVEF 60-65%, ascending aorta, measuring 43 mm.  There is mild dilatation of the aortic root, measuring 40 mm.  Cardiac cath 09/11/20   Prox LAD lesion is 40% stenosed seen in cranial views only.  Eccentric lesion.   Prox RCA lesion is 25% stenosed.   Mid LAD-2 lesion is 30% stenosed.   Previously placed Mid LAD-1 stent (unknown type) is  widely patent.   LV end diastolic pressure is normal.   There is no aortic valve stenosis.   Dilated aortic root.   Patent stent.  Mild to moderate scattered nonobstructive CAD.   Dilated aortic root to be followed with serial imaging.    Echocardiogram 07/17/20  1. Left ventricular ejection fraction, by estimation, is 60 to 65%. Left  ventricular ejection fraction by 3D volume is 67 %. The left ventricle has  normal function. The left  ventricle has no regional wall motion  abnormalities. Left ventricular diastolic   parameters were normal.   2. Right ventricular systolic function is normal. The right ventricular  size is normal. There is normal pulmonary artery systolic pressure. The  estimated right ventricular systolic pressure is 24.4 mmHg.   3. The mitral valve is normal in structure. Trivial mitral valve  regurgitation. No evidence of mitral stenosis.   4. The aortic valve is tricuspid. Aortic valve regurgitation is mild. No  aortic stenosis is present.   5. Aortic dilatation noted. There is mild dilatation of the ascending  aorta, measuring 43 mm. There is mild dilatation of the aortic root,  measuring 40 mm.   6. The inferior vena cava is normal in size with greater than 50%  respiratory variability, suggesting right atrial pressure of 3 mmHg.   Comparison(s): No significant change from prior study. Prior images  reviewed side by side.   G[place] Myoview 06/19/20 The left ventricular ejection fraction is mildly decreased (45-54%). Nuclear stress EF: 48%. The study is normal. This is a low risk study.   1. Normal perfusion without evidence of ischemia or infarction. 2. Mildly reduced LVEF, 48%. Visually this appears normal. Would recommend an echocardiogram for better evaluation. 3. Exceptional function capacity (13:01 min:s; 13.6 METS).  4. Frequent PVCs during exercise (with couplets) and lasting into recovery.  5. Normal HR/BP response to exercise.   CT ANGIOGRAPHY CHEST WITH CONTRAST 04/29/2020: 1. Stable aneurysmal disease of the ascending thoracic aorta measuring approximately 4.5 cm in greatest diameter. Ascending thoracic aortic aneurysm. Recommend semi-annual imaging followup by CTA or MRA and referral to cardiothoracic surgery if not already obtained. This recommendation follows 2010 ACCF/AHA/AATS/ACR/ASA/SCA/SCAI/SIR/STS/SVM Guidelines for the Diagnosis and Management of Patients With Thoracic  Aortic Disease. Circulation. 2010; 121: W102-V253. Aortic aneurysm NOS (ICD10-I71.9) 2. Stable evidence of coronary atherosclerosis.   Echo 05/01/2019:  1. Left ventricular ejection fraction, by estimation, is 60 to 65%. The  left ventricle has normal function. The left ventricle has no regional  wall motion abnormalities. Left ventricular diastolic parameters were  normal.   2. Right ventricular systolic function is normal. The right ventricular  size is normal. There is normal pulmonary artery systolic pressure.   3. The mitral valve is normal in structure. Trivial mitral valve  regurgitation. No evidence of mitral stenosis.   4. The aortic valve is tricuspid. Aortic valve regurgitation is trivial.  No aortic stenosis is present.  5. 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 Aortic dilatation noted. There is  moderate dilatation of the aortic root.   6. The inferior vena cava is normal in size with greater than 50%  respiratory variability, suggesting right atrial pressure of 3 mmHg.    Echo 02/14/18:    1. Sinus bradycardia in the upper 40s throughout study.  2. Moderate dilatation of the ascending aorta.  3. Aneurysm of the ascending aorta, measured at 4.5 cm. Recommend dedidated imaging ot aorta with CT or MRI if clinically indicated and not recently performed.  4. The left ventricle appears to be normal in size, have normal wall thickness, with EF of 60-65%. Echo evidence of normal diastolic filling patterns.  5. GLS -19.9%.  6. Right ventricular systolic pressure is is normal.  7. The right ventricle is normal in size, has normal wall thickness and normal systolic function.  8. Normal left atrial size.  9. Normal right atrial size. 10. Mild mitral annular calcification. 11. Mitral valve regurgitation is mild by color flow Doppler. 12. The mitral valve normal in structure and function. 13. Normal tricuspid  valve. 14. Aortic valve normal. 15. Aortic valve regurgitation is mild by color flow Doppler. 16. There is mild calcification of the aortic valve. 17. There is mild thickening of the aortic valve. 18. No atrial level shunt detected by color flow Doppler. 19. Tricuspid regurgitation is mild.   Coronary CT-A 12/29/16:  Ascending aneurysm 4.5 cm. <50% LM stenosis.  Patent proximal LAD stent.    Lexiscan Myoview 06/2016: Nuclear stress EF: 55%. Blood pressure demonstrated a hypertensive response to exercise. There was no ST segment deviation noted during stress. The study is normal. This is a low risk study. The left ventricular ejection fraction is normal (55-65%).   Normal resting and stress perfusion. No ischemia or infarction EF 55%   Echo 06/30/16: Nuclear stress EF: 55%. Blood pressure demonstrated a hypertensive response to exercise. There was no ST segment deviation noted during stress. The study is normal. This is a low risk study. The left ventricular ejection fraction is normal (55-65%).   Normal resting and stress perfusion. No ischemia or infarction EF 55%   CPX 05/23/13: Normal  EKG:  EKG ordered today. EKG performed today demonstrates SB 53 bpm with no acute ST/T wave changes.   Recent Labs: 12/03/2021: Hemoglobin 15.5; Platelets 176.0 12/23/2021: ALT 15; BUN 18; Creatinine, Ser 1.05; Potassium 4.7; Sodium 141  Recent Lipid Panel    Component Value Date/Time   CHOL 108 12/23/2021 0911   TRIG 126 12/23/2021 0911   TRIG 46 12/19/2008 0000   HDL 42 12/23/2021 0911   CHOLHDL 2.6 12/23/2021 0911   CHOLHDL 4 03/25/2016 0914   VLDL 18.2 03/25/2016 0914   LDLCALC 44 12/23/2021 0911   Home Medications   Current Meds  Medication Sig   aspirin 81 MG tablet Take 81 mg by mouth daily.   Calcium Carb-Cholecalciferol (CALCIUM+D3) 600-20 MG-MCG TABS Take 1 tablet by mouth daily.   clopidogrel (PLAVIX) 75 MG tablet Take 1 tablet by mouth once daily (Patient taking  differently: Take 75 mg by mouth daily.)   Coenzyme Q10 (CO Q-10) 300 MG CAPS Take 300 mg by mouth every morning.   inFLIXimab (REMICADE IV) Inject 300 mg into the vein. AS DIRECTED   latanoprost (XALATAN) 0.005 % ophthalmic solution Place 1 drop into the left eye daily.   mesalamine (LIALDA) 1.2 g  EC tablet Take 4 tablets (4.8 g total) by mouth daily with breakfast.   mesalamine (ROWASA) 4 g enema Place 60 mLs (4 g total) rectally at bedtime.   Multiple Vitamins-Minerals (MULTIVITAMIN WITH MINERALS) tablet Take 1 tablet by mouth daily.   REPATHA SURECLICK 937 MG/ML SOAJ INJECT 140 MG INTO THE SKIN EVERY 14 DAYS (Patient taking differently: Inject 140 mg into the skin every 14 (fourteen) days. Friday)   timolol (TIMOPTIC) 0.5 % ophthalmic solution Place 1 drop into both eyes 2 (two) times daily.      Review of Systems     All other systems reviewed and are otherwise negative except as noted above.  Physical Exam    VS:  BP 124/68   Pulse (!) 53   Ht 5' 8"  (1.727 m)   Wt 150 lb 8 oz (68.3 kg)   BMI 22.88 kg/m  , BMI Body mass index is 22.88 kg/m.  Wt Readings from Last 3 Encounters:  12/27/21 150 lb 8 oz (68.3 kg)  12/06/21 147 lb 6.4 oz (66.9 kg)  11/04/21 149 lb 8 oz (67.8 kg)    GEN: Well nourished, well developed, in no acute distress. HEENT: normal. Neck: Supple, no JVD, carotid bruits, or masses. Cardiac: RRR, no murmurs, rubs, or gallops. No clubbing, cyanosis, edema.  Radials/PT 2+ and equal bilaterally.  Respiratory:  Respirations regular and unlabored, clear to auscultation bilaterally. GI: Soft, nontender, nondistended. MS: No deformity or atrophy. Skin: Warm and dry, no rash. Neuro:  Strength and sensation are intact. Psych: Normal affect.  Assessment & Plan    Dilated aortic root - CT 04/2020 aortic root 47m, ascending aorta 427m Repeat echo 12/2021 aortic root 4434mnd ascending aorta 75m48mo beta blocker due to bradycardia. Continue optimal BP control. BP at  home < 130/80 - discussed to monitor BP at home at least 2 hours after medications and sitting for 5-10 minutes.   CAD  - Previous stent to LAD in 2004.  Cath 08/2020 with patent stent and otherwise nonobstructive disease. Reports no anginal symptoms. GDMT includes repatha, plavix, aspirin. No BB due to bradycardia.   Bradycardia - Asymptomatic and chronic. Continue to avoid AV nodal blocking agents.   MR - trivial by echo  07/2020. Repeat echo only as clinically indicated.   PVC - Noted by previous EKG. No palpitations. No AV nodal blocking therapy due to baseline bradycardia. If becomes symptomatic, consider ZIO.  Disposition: Follow up in 6 months with Dr. RandOval LinseyAPP.  Signed, CaitLoel Dubonnet 12/27/2021, 1:46 PM [place]

## 2021-12-29 ENCOUNTER — Encounter: Payer: Self-pay | Admitting: Gastroenterology

## 2021-12-29 ENCOUNTER — Ambulatory Visit (INDEPENDENT_AMBULATORY_CARE_PROVIDER_SITE_OTHER): Payer: Medicare Other | Admitting: Gastroenterology

## 2021-12-29 ENCOUNTER — Other Ambulatory Visit: Payer: Medicare Other

## 2021-12-29 VITALS — BP 114/72 | HR 56 | Ht 68.5 in | Wt 150.0 lb

## 2021-12-29 DIAGNOSIS — I2510 Atherosclerotic heart disease of native coronary artery without angina pectoris: Secondary | ICD-10-CM | POA: Diagnosis not present

## 2021-12-29 DIAGNOSIS — K5190 Ulcerative colitis, unspecified, without complications: Secondary | ICD-10-CM

## 2021-12-29 NOTE — Patient Instructions (Signed)
Please come 2 weeks before your next appointment and do your lab work.  Due to recent changes in healthcare laws, you may see the results of your imaging and laboratory studies on MyChart before your provider has had a chance to review them.  We understand that in some cases there may be results that are confusing or concerning to you. Not all laboratory results come back in the same time frame and the provider may be waiting for multiple results in order to interpret others.  Please give [place] 48 hours in order for your provider to thoroughly review all the results before contacting the office for clarification of your results.    I appreciate the opportunity to care for you. Harl Bowie, MD

## 2021-12-29 NOTE — Progress Notes (Unsigned)
Joseph Booker    [id number]    [birth year]  Primary Care Physician:Burns, Claudina Lick, MD  Referring Physician: Binnie Rail, MD [place],  [place] [address]   Chief complaint: Ulcerative colitis  HPI:  71 year old very pleasant gentleman with history of chronic ulcerative colitis, was in clinical remission on daily mesalamine for almost past 17 years with symptoms of acute severe UC flare since June 2023   He did not have any improvement on prednisone 40 mg daily.  GI stool pathogen panel and C. difficile negative X 2   He is currently on infliximab 5 mg/kg Random drug level for infliximab on 09/28/2021 was 39 with undetectable antibodies Infliximab drug trough 10 with detectable antiinfliximab antibody 48   His symptoms are slowly improving.   CMV PCR negative   CRP 1.53 and sed rate 8 on September 16, 2021   Fecal calprotectin 550 on September 21, 2021   Flexible sigmoidoscopy 09/28/2021 - The perianal and digital rectal examinations were normal. - A continuous area of ulcerated mucosa with stigmata of recent bleeding was present in the rectum extending to rectosigmoid. Biopsies were taken with a cold forceps for histology. - Normal mucosa was found sigmoid colon to visualized extent in the descending colon. Retroflexion not performed in the rectum due to proctitis.   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     He is tolerating mesalamine enema but is unable to retain it for more than 2 hours at a time.  Initially when it was tried few months ago he had severe reaction to it but does not have it anymore   He had a mild flare in April 2022, he was treated with tapered dose of budesonide.  After taking it for 2 months he was experiencing side  effects and has since stopped taking it. Prior to the episode of UC flare in April, he was on chronic antibiotics for ocular rosacea.     Colonoscopy April 17, 2020 - The perianal and digital rectal examinations were normal. - Inflammation characterized by altered vascularity, congestion (edema), erythema and scarring was found as medium patches surrounded by normal mucosa in the rectum, in the descending colon, in the splenic flexure, in the transverse colon, in the ascending colon and at the cecum. This was mild in severity, and when compared to previous examinations, the findings are worsened. Biopsies were taken with a cold forceps for histology. - Non-bleeding internal hemorrhoids were found during retroflexion. The hemorrhoids were medium-sized.   1. Surgical [P], right colon - MILDLY ACTIVE CHRONIC COLITIS, CONSISTENT WITH PATIENT'S CLINICAL HISTORY OF ULCERATIVE COLITIS - NEGATIVE FOR GRANULOMAS OR DYSPLASIA 2. Surgical [P], left colon - COLONIC MUCOSA WITH NO SPECIFIC HISTOPATHOLOGIC CHANGES - NEGATIVE FOR ACUTE INFLAMMATION, FEATURES OF CHRONICITY, GRANULOMAS OR DYSPLASIA   Outpatient Encounter Medications as of 12/29/2021  Medication Sig   aspirin 81 MG tablet Take 81 mg by mouth daily.   Calcium Carb-Cholecalciferol (CALCIUM+D3) 600-20 MG-MCG TABS Take 1 tablet by mouth daily.   clopidogrel (PLAVIX) 75 MG tablet Take 1 tablet by mouth once daily (Patient taking differently: Take 75 mg by mouth daily.)   Coenzyme Q10 (CO Q-10) 300 MG CAPS Take 300 mg by mouth  every morning.   inFLIXimab (REMICADE IV) Inject 300 mg into the vein. AS DIRECTED   latanoprost (XALATAN) 0.005 % ophthalmic solution Place 1 drop into the left eye daily.   mesalamine (LIALDA) 1.2 g EC tablet Take 4 tablets (4.8 g total) by mouth daily with breakfast.   mesalamine (ROWASA) 4 g enema Place 60 mLs (4 g total) rectally at bedtime.   Multiple Vitamins-Minerals (MULTIVITAMIN WITH MINERALS) tablet Take 1  tablet by mouth daily.   REPATHA SURECLICK 914 MG/ML SOAJ INJECT 140 MG INTO THE SKIN EVERY 14 DAYS (Patient taking differently: Inject 140 mg into the skin every 14 (fourteen) days. Friday)   timolol (TIMOPTIC) 0.5 % ophthalmic solution Place 1 drop into both eyes 2 (two) times daily.    No facility-administered encounter medications on file as of 12/29/2021.    Allergies as of 12/29/2021 - Review Complete 12/27/2021  Allergen Reaction Noted   Atorvastatin Other (See Comments) 04/04/2019    Past Medical History:  Diagnosis Date   Allergic rhinitis    Allergy    Anxiety state, unspecified    Ascending aortic aneurysm ([place]) 05/15/2020   Bradycardia 05/03/2021   CAD (coronary artery disease)    [name] / nuclear 2005, excellent tolerance no scar or ischemia, EF 51% / nuclear, February, 2012, no scar or ischemia / catheterization March 26, 2010.. 10% in-stent restenosis, minimal other nonobstructive coronary disease, ejection fraction 55%., excellent result; ETT 9/13: ex 13:14, no CP, no ischemic ECG changes   Cancer (HCC)    basal cell carcinoma x2 on face   Cataract    surgery to both eyes   Elevated blood pressure reading 05/15/2020   Glaucoma, left eye    left eye   History of exercise intolerance 05/23/2013   cardiopulmonary exercise test-- normal functional capacity when compared to matched sedentary norms, borderline ventilatory limitation with the patient's exercise tidal volume reaching 90% of the measured inspiratory capacity ( pt reports no dyspnea), did not appear to be circulatory limitation, normal CPX test   Hyperlipidemia    Nocturia    Osteopenia    Pure hypercholesterolemia 06/12/2009   Thoracic ascending aortic aneurysm [facility])    followed by dr Skeet Latch---  last CT 12-29-2016 ,  4.5cm   Ulcerative colitis, universal ([place])    GI-- [name]--  dx 1990s  (per note mild Mayo Score 1)   Wears glasses     Past Surgical History:  Procedure  Laterality Date   CARDIAC CATHETERIZATION  03/26/2010    dr Dian Queen   single vessel CAD w/ patent mLAD stent w/ 10% in-stent restenosis w/ mild to moderate disease in the remainder LAD w/ no lesions that appearedto be flow-limitinf/  normal LVFSF, ef 55%   CARDIOVASCULAR STRESS TEST  06-30-2016   [name]   Low risk nuclear study w/ no evidence ishcemia/  normal LV function and wall motion,  nuclear stress ef 55%   COLONOSCOPY  last one 10-27-2016   CORONARY ANGIOPLASTY WITH STENT PLACEMENT  07-25-2002   [name]   PTCA w/  DES x1 to [place] Left 2006   INGUINAL HERNIA REPAIR Right 07/03/2017   Procedure: OPEN REPAIR OF RIGHT INGUINAL HERNIA WITH MESH;  Surgeon: Kinsinger, Arta Bruce, MD;  Location: [place];  Service: General;  Laterality: Right;   INSERTION OF MESH Right 07/03/2017   Procedure: INSERTION OF MESH;  Surgeon: Kinsinger, Arta Bruce, MD;  Location: [place];  Service: General;  Laterality: Right;   LEFT HEART CATH AND CORONARY ANGIOGRAPHY N/A 09/11/2020   Procedure: LEFT HEART CATH AND CORONARY ANGIOGRAPHY;  Surgeon: Jettie Booze, MD;  Location: [place] [facility];  Service: Cardiovascular;  Laterality: N/A;   SEPTOPLASTY  1987   w/ RHINOPLASTY   THORACIC AORTOGRAM N/A 09/11/2020   Procedure: THORACIC AORTOGRAM;  Surgeon: Jettie Booze, MD;  Location: [place] [facility];  Service: Cardiovascular;  Laterality: N/A;   TONSILLECTOMY  1959   TRANSTHORACIC ECHOCARDIOGRAM  06-30-2016     dr Alison Murray 55-60%/  trivial AR/ mild dilated ascending aorta, 60m/  trivial MR/  mild PR and TR     Family History  Problem Relation Age of Onset   Diabetes Mother    Heart disease Mother    Lung disease Mother        ILD   Rheum arthritis Mother    CAD Mother    Heart disease Father 537      MI   Heart attack Father    Asthma Sister    Colon cancer Neg Hx    Esophageal cancer Neg Hx    Pancreatic cancer Neg Hx     Kidney disease Neg Hx    Liver disease Neg Hx    Rectal cancer Neg Hx    Stomach cancer Neg Hx     Social History   Socioeconomic History   Marital status: Married    Spouse name: Not on file   Number of children: 1   Years of education: Not on file   Highest education level: Not on file  Occupational History   Occupation: retired    EF[employer] farm manager RETIRED  Tobacco Use   Smoking status: Never   Smokeless tobacco: Never   Tobacco comments:    Married, 1 stepson, 5 g-kids. Retired aEmergency planning/management officer now vEnvironmental health practitionerUse: Never used  Substance and Sexual Activity   Alcohol use: Yes    Comment: occasional wine   Drug use: No   Sexual activity: Yes    Partners: Female  Other Topics Concern   Not on file  Social History Narrative   Exercising regularly: walks at least 5 miles a day, 6 days a week   Social Determinants of HRadio broadcast assistantStrain: Not on file  Food Insecurity: Not on file  Transportation Needs: Not on file  Physical Activity: Not on file  Stress: Not on file  Social Connections: Not on file  Intimate Partner Violence: Not on file      Review of systems: All other review of systems negative except as mentioned in the HPI.   Physical Exam: There were no vitals filed for this visit. There is no height or weight on file to calculate BMI. Gen:      No acute distress HEENT:  sclera anicteric Abd:      soft, non-tender; no palpable masses, no distension Ext:    No edema Neuro: alert and oriented x 3 Psych: normal mood and affect  Data Reviewed:  Reviewed labs, radiology imaging, old records and pertinent past GI work up   Assessment and Plan/Recommendations:  ***  This visit required *** minutes of patient care (this includes precharting, chart review, review of results, face-to-face time used for counseling as well as treatment plan and follow-up. The patient was provided an opportunity to ask questions and all were  answered. The patient agreed with the  plan and demonstrated an understanding of the instructions.  Damaris Hippo , MD    CC: Binnie Rail, MD

## 2022-01-04 ENCOUNTER — Encounter: Payer: Self-pay | Admitting: Gastroenterology

## 2022-01-05 ENCOUNTER — Encounter: Payer: Self-pay | Admitting: Internal Medicine

## 2022-01-06 ENCOUNTER — Ambulatory Visit
Admission: RE | Admit: 2022-01-06 | Discharge: 2022-01-06 | Disposition: A | Discharge status: Home / Self Care | Payer: Medicare Other | Source: Ambulatory Visit | Attending: Emergency Medicine | Admitting: Emergency Medicine

## 2022-01-06 VITALS — BP 105/63 | HR 54 | Temp 99.3°F | Resp 16

## 2022-01-06 DIAGNOSIS — U071 COVID-19: Secondary | ICD-10-CM

## 2022-01-06 MED ORDER — PROMETHAZINE-DM 6.25-15 MG/5ML PO SYRP: 5.0000 mL | ORAL_SOLUTION | Freq: Four times a day (QID) | ORAL | 0 refills | Status: DC | PRN

## 2022-01-06 MED ORDER — PAXLOVID (300/100) 20 X 150 MG & 10 X 100MG PO TBPK: 3.0000 | ORAL_TABLET | Freq: Two times a day (BID) | ORAL | 0 refills | Status: AC

## 2022-01-06 NOTE — ED Triage Notes (Signed)
The pt c/o cough, fever, chills,headache and nausea that began Monday.  Home interventions: tylenol, nyquil   The patient states at home test yesterday was positive for covid.

## 2022-01-06 NOTE — Discharge Instructions (Signed)
After reviewing.  Your medication list and comparing it to interactions with Paxlovid, the only medication that you are taking that has a drug drug interaction with Paxlovid is Plavix.  Paxlovid makes Plavix less effective.  Because I see that your cardiologist has been okay with you stopping Plavix for various reasons in the past, my recommendation is for you to take the Paxlovid and continue taking Plavix as you have been.  Please consult with the handout we provided to you today if you have questions or concerns about COVID-19.  If you find that your questions or not addressed in this handout, please feel free to contact us here at the number provided in your AVS today.  Please pay visual attention to the list of reasons to seek emergency medical attention.  I am glad you already have an O2 sensor, I find this a much better way to monitor symptoms of COVID then checking your temperature.  If you find that you are falling below 90% with regular activity such as walking, please go to the emergency room for further evaluation.  Chest x-ray may be indicated.  Thank you for visiting urgent care today.  We hope you feel better soon.

## 2022-01-06 NOTE — ED Provider Notes (Signed)
[facility]    CSN: [id number] Arrival date & time: 01/06/22  0263    HISTORY   Chief Complaint  Patient presents with   Chills    Fever, cough, headache, some nausea - Entered by patient   Headache   Fever   Nausea   Cough   Covid Positive   HPI Joseph Booker is a pleasant, 71 y.o. male who presents to urgent care today. Patient presents to urgent care after positive home COVID 19 test today.  Patient states his symptoms began 3 days ago.  Patient states has been taking Tylenol and NyQuil with some relief of his symptoms.  Patient states that he contacted his primary care provider's office who stated they would not provide him Paxlovid until he was seen and evaluated by medical provider.  Patient denies shortness of breath, nighttime cough, body aches, chills at this time.  Patient states initially had very dry cough, fever, dull headache and mild nausea but these have mostly resolved.  Patient states his wife is feeling otherwise well.  The history is provided by the patient.   Past Medical History:  Diagnosis Date   Allergic rhinitis    Allergy    Anxiety state, unspecified    Ascending aortic aneurysm ([place]) 05/15/2020   Bradycardia 05/03/2021   CAD (coronary artery disease)    [name] / nuclear 2005, excellent tolerance no scar or ischemia, EF 51% / nuclear, February, 2012, no scar or ischemia / catheterization March 26, 2010.. 10% in-stent restenosis, minimal other nonobstructive coronary disease, ejection fraction 55%., excellent result; ETT 9/13: ex 13:14, no CP, no ischemic ECG changes   Cancer (HCC)    basal cell carcinoma x2 on face   Cataract    surgery to both eyes   Elevated blood pressure reading 05/15/2020   Glaucoma, left eye    left eye   History of exercise intolerance 05/23/2013   cardiopulmonary exercise test-- normal functional capacity when compared to matched sedentary norms, borderline ventilatory limitation with the patient's  exercise tidal volume reaching 90% of the measured inspiratory capacity ( pt reports no dyspnea), did not appear to be circulatory limitation, normal CPX test   Hyperlipidemia    Nocturia    Osteopenia    Pure hypercholesterolemia 06/12/2009   Thoracic ascending aortic aneurysm [facility])    followed by dr Skeet Latch---  last CT 12-29-2016 ,  4.5cm   Ulcerative colitis, universal ([place])    GI-- [name]--  dx 1990s  (per note mild Mayo Score 1)   Wears glasses    Patient Active Problem List   Diagnosis Date Noted   Bradycardia 05/03/2021   Ascending aortic aneurysm ([place]) 05/15/2020   Elevated blood pressure reading 05/15/2020   Right inguinal hernia 05/31/2017   Cough 04/14/2017   Hyperglycemia 02/01/2017   Change in stool 09/27/2016   Herpes zoster without complication 78/58/8502   Glaucoma 08/21/2013   Mitral regurgitation 05/23/2013   Shortness of breath 05/13/2013   Dilated aortic root ([place]) 05/13/2013   Pectus excavatum 05/13/2013   Left leg numbness 05/13/2013   BPH (benign prostatic hypertrophy) 07/06/2011   CAD (coronary artery disease)    Pure hypercholesterolemia 06/12/2009   Vitamin D deficiency 06/04/2009   ALLERGIC RHINITIS 06/04/2009   GERD 06/04/2009   ANXIETY STATE, UNSPECIFIED 05/15/2009   Osteopenia 05/15/2009   Ulcerative colitis ([place]) 05/17/2007   Past Surgical History:  Procedure Laterality Date   CARDIAC CATHETERIZATION  03/26/2010    dr Dian Queen  single vessel CAD w/ patent mLAD stent w/ 10% in-stent restenosis w/ mild to moderate disease in the remainder LAD w/ no lesions that appearedto be flow-limitinf/  normal LVFSF, ef 55%   CARDIOVASCULAR STRESS TEST  06-30-2016   [name]   Low risk nuclear study w/ no evidence ishcemia/  normal LV function and wall motion,  nuclear stress ef 55%   COLONOSCOPY  last one 10-27-2016   CORONARY ANGIOPLASTY WITH STENT PLACEMENT  07-25-2002   [name]   PTCA w/  DES x1 to [place] Left  2006   INGUINAL HERNIA REPAIR Right 07/03/2017   Procedure: OPEN REPAIR OF RIGHT INGUINAL HERNIA WITH MESH;  Surgeon: Kinsinger, Arta Bruce, MD;  Location: [facility];  Service: General;  Laterality: Right;   INSERTION OF MESH Right 07/03/2017   Procedure: INSERTION OF MESH;  Surgeon: Kieth Brightly Arta Bruce, MD;  Location: [facility];  Service: General;  Laterality: Right;   LEFT HEART CATH AND CORONARY ANGIOGRAPHY N/A 09/11/2020   Procedure: LEFT HEART CATH AND CORONARY ANGIOGRAPHY;  Surgeon: Jettie Booze, MD;  Location: [facility];  Service: Cardiovascular;  Laterality: N/A;   SEPTOPLASTY  1987   w/ RHINOPLASTY   THORACIC AORTOGRAM N/A 09/11/2020   Procedure: THORACIC AORTOGRAM;  Surgeon: Jettie Booze, MD;  Location: [facility];  Service: Cardiovascular;  Laterality: N/A;   TONSILLECTOMY  1959   TRANSTHORACIC ECHOCARDIOGRAM  06-30-2016     dr Alison Murray 55-60%/  trivial AR/ mild dilated ascending aorta, 21m/  trivial MR/  mild PR and TR     Home Medications    Prior to Admission medications   Medication Sig Start Date End Date Taking? Authorizing Provider  nirmatrelvir & ritonavir (PAXLOVID, 300/100,) 20 x 150 MG & 10 x 100MG TBPK Take 3 tablets by mouth 2 (two) times daily for 5 days. GFR 77 on 12/23/2021 01/06/22 01/11/22 Yes MLynden OxfordScales, PA-C  aspirin 81 MG tablet Take 81 mg by mouth daily.    [provider]  Calcium Carb-Cholecalciferol (CALCIUM+D3) 600-20 MG-MCG TABS Take 1 tablet by mouth daily.    [provider]  clopidogrel (PLAVIX) 75 MG tablet Take 1 tablet by mouth once daily Patient taking differently: Take 75 mg by mouth daily. 04/26/21   RSkeet Latch MD  Coenzyme Q10 (CO Q-10) 300 MG CAPS Take 300 mg by mouth every morning.    [provider]  inFLIXimab (REMICADE IV) Inject 300 mg into the vein. AS DIRECTED    [provider]  latanoprost (XALATAN) 0.005 %  ophthalmic solution Place 1 drop into the left eye daily. 08/06/20   [provider]  mesalamine (LIALDA) 1.2 g EC tablet Take 4 tablets (4.8 g total) by mouth daily with breakfast. 10/07/21   NMauri Pole MD  mesalamine (ROWASA) 4 g enema Place 60 mLs (4 g total) rectally at bedtime. Patient taking differently: Place 4 g rectally at bedtime as needed. 11/04/21   NMauri Pole MD  Multiple Vitamins-Minerals (MULTIVITAMIN WITH MINERALS) tablet Take 1 tablet by mouth daily.    [provider]  REPATHA SURECLICK 1314MG/ML SOAJ INJECT 140 MG INTO THE SKIN EVERY 14 DAYS Patient taking differently: Inject 140 mg into the skin every 14 (fourteen) days. Friday 05/03/21   RSkeet Latch MD  timolol (TIMOPTIC) 0.5 % ophthalmic solution Place 1 drop into both eyes 2 (two) times daily.  [provider]    Family History Family History  Problem Relation Age of Onset   Diabetes Mother    Heart disease Mother    Lung disease Mother        ILD   Rheum arthritis Mother    CAD Mother    Heart disease Father 38       MI   Heart attack Father    Asthma Sister    Colon cancer Neg Hx    Esophageal cancer Neg Hx    Pancreatic cancer Neg Hx    Kidney disease Neg Hx    Liver disease Neg Hx    Rectal cancer Neg Hx    Stomach cancer Neg Hx    Social History Social History   Tobacco Use   Smoking status: Never   Smokeless tobacco: Never   Tobacco comments:    Married, 1 stepson, 5 g-kids. Retired Emergency planning/management officer, now Environmental health practitioner Use: Never used  Substance Use Topics   Alcohol use: Yes    Comment: occasional wine   Drug use: No   Allergies   Atorvastatin  Review of Systems Review of Systems Pertinent findings revealed after performing a 14 point review of systems has been noted in the history of present illness.  Physical Exam Triage Vital Signs ED Triage Vitals  Enc Vitals Group     BP 11/13/20 0827 (!) 147/82     Pulse  Rate 11/13/20 0827 72     Resp 11/13/20 0827 18     Temp 11/13/20 0827 98.3 F (36.8 C)     Temp Source 11/13/20 0827 Oral     SpO2 11/13/20 0827 98 %     Weight --      Height --      Head Circumference --      Peak Flow --      Pain Score 11/13/20 0826 5     Pain Loc --      Pain Edu? --      Excl. in [place]? --   No data found.  Updated Vital Signs BP 105/63 (BP Location: Right Arm)   Pulse (!) 54   Temp 99.3 F (37.4 C) (Oral)   Resp 16   SpO2 97%   Physical Exam Vitals and nursing note reviewed.  Constitutional:      General: He is not in acute distress.    Appearance: Normal appearance. He is not ill-appearing.  HENT:     Head: Normocephalic and atraumatic.     Salivary Glands: Right salivary gland is not diffusely enlarged or tender. Left salivary gland is not diffusely enlarged or tender.     Right Ear: Tympanic membrane, ear canal and external ear normal. No drainage. No middle ear effusion. There is no impacted cerumen. Tympanic membrane is not erythematous or bulging.     Left Ear: Tympanic membrane, ear canal and external ear normal. No drainage.  No middle ear effusion. There is no impacted cerumen. Tympanic membrane is not erythematous or bulging.     Nose: Nose normal. No nasal deformity, septal deviation, mucosal edema, congestion or rhinorrhea.     Right Turbinates: Not enlarged, swollen or pale.     Left Turbinates: Not enlarged, swollen or pale.     Right Sinus: No maxillary sinus tenderness or frontal sinus tenderness.     Left Sinus: No maxillary sinus tenderness or frontal sinus tenderness.     Mouth/Throat:  Lips: Pink. No lesions.     Mouth: Mucous membranes are moist. No oral lesions.     Pharynx: Oropharynx is clear. Uvula midline. No posterior oropharyngeal erythema or uvula swelling.     Tonsils: No tonsillar exudate. 0 on the right. 0 on the left.  Eyes:     General: Lids are normal.        Right eye: No discharge.        Left eye: No  discharge.     Extraocular Movements: Extraocular movements intact.     Conjunctiva/sclera: Conjunctivae normal.     Right eye: Right conjunctiva is not injected.     Left eye: Left conjunctiva is not injected.  Neck:     Trachea: Trachea and phonation normal.  Cardiovascular:     Rate and Rhythm: Normal rate and regular rhythm.     Pulses: Normal pulses.     Heart sounds: Normal heart sounds. No murmur heard.    No friction rub. No gallop.  Pulmonary:     Effort: Pulmonary effort is normal. No accessory muscle usage, prolonged expiration or respiratory distress.     Breath sounds: Normal breath sounds. No stridor, decreased air movement or transmitted upper airway sounds. No decreased breath sounds, wheezing, rhonchi or rales.  Chest:     Chest wall: No tenderness.  Musculoskeletal:        General: Normal range of motion.     Cervical back: Normal range of motion and neck supple. Normal range of motion.  Lymphadenopathy:     Cervical: No cervical adenopathy.  Skin:    General: Skin is warm and dry.     Findings: No erythema or rash.  Neurological:     General: No focal deficit present.     Mental Status: He is alert and oriented to person, place, and time.  Psychiatric:        Mood and Affect: Mood normal.        Behavior: Behavior normal.     Visual Acuity Right Eye Distance:   Left Eye Distance:   Bilateral Distance:    Right Eye Near:   Left Eye Near:    Bilateral Near:     UC Couse / Diagnostics / Procedures:     Radiology No results found.  Procedures Procedures (including critical care time) EKG  Pending results:  Labs Reviewed - No data to display  Medications Ordered in UC: Medications - No data to display  UC Diagnoses / Final Clinical Impressions(s)   I have reviewed the triage vital signs and the nursing notes.  Pertinent labs & imaging results that were available during my care of the patient were reviewed by me and considered in my medical  decision making (see chart for details).    Final diagnoses:  COVID-19   Patient advised that given his history of heart disease and age, he definitely qualifies for Paxlovid, GFR was 14 on December 03, 2021.  Paxlovid  Prescription sent to pharmacy.  Conservative care recommended.  Return precautions advised. Please see discharge instructions below for further details of plan of care as provided to patient. ED Prescriptions     Medication Sig Dispense Auth. Provider   nirmatrelvir & ritonavir (PAXLOVID, 300/100,) 20 x 150 MG & 10 x 100MG TBPK Take 3 tablets by mouth 2 (two) times daily for 5 days. GFR 77 on 12/23/2021 30 tablet Lynden Oxford Scales, PA-C      PDMP not reviewed this encounter.  Disposition Upon Discharge:  Condition: stable for discharge home Home: take medications as prescribed; routine discharge instructions as discussed; follow up as advised.  Patient presented with an acute illness with associated systemic symptoms and significant discomfort requiring urgent management. In my opinion, this is a condition that a prudent lay person (someone who possesses an average knowledge of health and medicine) may potentially expect to result in complications if not addressed urgently such as respiratory distress, impairment of bodily function or dysfunction of bodily organs.   Routine symptom specific, illness specific and/or disease specific instructions were discussed with the patient and/or caregiver at length.   As such, the patient has been evaluated and assessed, work-up was performed and treatment was provided in alignment with urgent care protocols and evidence based medicine.  Patient/parent/caregiver has been advised that the patient may require follow up for further testing and treatment if the symptoms continue in spite of treatment, as clinically indicated and appropriate.  If the patient was tested for COVID-19, Influenza and/or RSV, then the patient/parent/guardian  was advised to isolate at home pending the results of his/her diagnostic coronavirus test and potentially longer if they're positive. I have also advised pt that if his/her COVID-19 test returns positive, it's recommended to self-isolate for at least 10 days after symptoms first appeared AND until fever-free for 24 hours without fever reducer AND other symptoms have improved or resolved. Discussed self-isolation recommendations as well as instructions for household member/close contacts as per the [facility] and [place] DHHS, and also gave patient the [place] packet with this information.  Patient/parent/caregiver has been advised to return to the [facility] or PCP in 3-5 days if no better; to PCP or the Emergency Department if new signs and symptoms develop, or if the current signs or symptoms continue to change or worsen for further workup, evaluation and treatment as clinically indicated and appropriate  The patient will follow up with their current PCP if and as advised. If the patient does not currently have a PCP we will assist them in obtaining one.   The patient may need specialty follow up if the symptoms continue, in spite of conservative treatment and management, for further workup, evaluation, consultation and treatment as clinically indicated and appropriate.  Patient/parent/caregiver verbalized understanding and agreement of plan as discussed.  All questions were addressed during visit.  Please see discharge instructions below for further details of plan.  Discharge Instructions:   Discharge Instructions      After reviewing.  Your medication list and comparing it to interactions with Paxlovid, the only medication that you are taking that has a drug drug interaction with Paxlovid is Plavix.  Paxlovid makes Plavix less effective.  Because I see that your cardiologist has been okay with you stopping Plavix for various reasons in the past, my recommendation is for you to take the Paxlovid and continue taking  Plavix as you have been.  Please consult with the handout we provided to you today if you have questions or concerns about COVID-19.  If you find that your questions or not addressed in this handout, please feel free to contact us here at the number provided in your AVS today.  Please pay visual attention to the list of reasons to seek emergency medical attention.  I am glad you already have an O2 sensor, I find this a much better way to monitor symptoms of COVID then checking your temperature.  If you find that you are falling below 90% with regular activity such as walking, please go to  the emergency room for further evaluation.  Chest x-ray may be indicated.  Thank you for visiting urgent care today.  We hope you feel better soon.    This office note has been dictated using Museum/gallery curator.  Unfortunately, this method of dictation can sometimes lead to typographical or grammatical errors.  I apologize for your inconvenience in advance if this occurs.  Please do not hesitate to reach out to me if clarification is needed.      Lynden Oxford Scales, [place] 01/07/22 [phone]

## 2022-01-19 ENCOUNTER — Telehealth: Payer: Self-pay | Admitting: Pharmacy Technician

## 2022-01-19 NOTE — Telephone Encounter (Signed)
Auth Submission: NO AUTH NEEDED Payer: MEDICARE A.B / AARP Medication & CPT/J Code(s) submitted: Remicade (Infliximab) J1745 Route of submission (phone, fax, portal):  Phone # Fax # Auth type: Buy/Bill Units/visits requested:  Reference number:  Approval from: 01/19/22 to 01/17/23

## 2022-01-31 ENCOUNTER — Other Ambulatory Visit: Payer: Self-pay

## 2022-01-31 ENCOUNTER — Other Ambulatory Visit: Payer: Self-pay | Admitting: Pharmacy Technician

## 2022-01-31 ENCOUNTER — Ambulatory Visit (INDEPENDENT_AMBULATORY_CARE_PROVIDER_SITE_OTHER): Payer: Medicare Other

## 2022-01-31 VITALS — BP 125/81 | HR 53 | Temp 97.6°F | Resp 18 | Ht 68.0 in | Wt 150.2 lb

## 2022-01-31 DIAGNOSIS — K5100 Ulcerative (chronic) pancolitis without complications: Secondary | ICD-10-CM | POA: Diagnosis not present

## 2022-01-31 MED ORDER — SODIUM CHLORIDE 0.9 % IV SOLN
5.0000 mg/kg | INTRAVENOUS | Status: DC
  Administered 2022-01-31: 300 mg via INTRAVENOUS
  Filled 2022-01-31: qty 30

## 2022-01-31 MED ORDER — DIPHENHYDRAMINE HCL 25 MG PO CAPS
25.0000 mg | ORAL_CAPSULE | Freq: Once | ORAL | Status: AC
  Administered 2022-01-31: 25 mg via ORAL
  Filled 2022-01-31: qty 1

## 2022-01-31 MED ORDER — ACETAMINOPHEN 325 MG PO TABS
650.0000 mg | ORAL_TABLET | Freq: Once | ORAL | Status: AC
  Administered 2022-01-31: 650 mg via ORAL
  Filled 2022-01-31: qty 2

## 2022-01-31 MED ORDER — SODIUM CHLORIDE 0.9 % IV SOLN
10.0000 mg/kg | INTRAVENOUS | Status: DC
  Filled 2022-01-31: qty 70

## 2022-01-31 MED ORDER — METHYLPREDNISOLONE SODIUM SUCC 40 MG IJ SOLR
40.0000 mg | Freq: Once | INTRAMUSCULAR | Status: AC
  Administered 2022-01-31: 40 mg via INTRAVENOUS
  Filled 2022-01-31: qty 1

## 2022-01-31 NOTE — Progress Notes (Signed)
Diagnosis: Ulcerative Pancolitis  Provider:  Marshell Garfinkel MD  Procedure: Infusion  IV Type: Peripheral, IV Location: L Forearm  Remicade (Infliximab), Dose: 300 mg  Infusion Start Time: 1638  Infusion Stop Time: 4536  Post Infusion IV Care: Peripheral IV Discontinued  Discharge: Condition: Good, Destination: Home . AVS provided to patient.   Performed by:  Cleophus Molt, RN

## 2022-02-12 ENCOUNTER — Encounter (HOSPITAL_BASED_OUTPATIENT_CLINIC_OR_DEPARTMENT_OTHER): Payer: Self-pay | Admitting: Cardiovascular Disease

## 2022-02-14 NOTE — Telephone Encounter (Signed)
PA appeal?

## 2022-02-15 ENCOUNTER — Encounter: Payer: Self-pay | Admitting: Gastroenterology

## 2022-02-15 ENCOUNTER — Other Ambulatory Visit (HOSPITAL_COMMUNITY): Payer: Self-pay

## 2022-02-15 ENCOUNTER — Telehealth: Payer: Self-pay

## 2022-02-15 NOTE — Telephone Encounter (Signed)
Pharmacy Patient Advocate Encounter  Received notification from [facility] that the request for prior authorization for REPATHA 140 MG/ML INJ has been denied due to "the information provided was not sufficient to support approval for medical necessity".      This determination is currently being reviewed for appeal.   Please be advised appeals may take up to 5 business days or more to receive determination. I have prepared and faxed necessary documentation to their appeals department on 02/15/22, will update encounter with outcome.   Karie Soda, CPhT Pharmacy Patient Advocate Specialist Direct Number: [phone] Fax: [phone]

## 2022-02-16 ENCOUNTER — Other Ambulatory Visit: Payer: Medicare Other

## 2022-02-16 DIAGNOSIS — K5190 Ulcerative colitis, unspecified, without complications: Secondary | ICD-10-CM | POA: Diagnosis not present

## 2022-02-17 ENCOUNTER — Other Ambulatory Visit (HOSPITAL_COMMUNITY): Payer: Self-pay

## 2022-02-18 ENCOUNTER — Other Ambulatory Visit: Payer: Self-pay | Admitting: Internal Medicine

## 2022-02-21 ENCOUNTER — Other Ambulatory Visit (INDEPENDENT_AMBULATORY_CARE_PROVIDER_SITE_OTHER): Payer: Medicare Other

## 2022-02-21 ENCOUNTER — Other Ambulatory Visit (HOSPITAL_COMMUNITY): Payer: Self-pay

## 2022-02-21 ENCOUNTER — Other Ambulatory Visit: Payer: Self-pay | Admitting: Pharmacist Clinician (PhC)/ Clinical Pharmacy Specialist

## 2022-02-21 DIAGNOSIS — K5190 Ulcerative colitis, unspecified, without complications: Secondary | ICD-10-CM | POA: Diagnosis not present

## 2022-02-21 LAB — CBC WITH DIFFERENTIAL/PLATELET
Basophils Absolute: 0.1 10*3/uL (ref 0.0–0.1)
Basophils Relative: 1.2 % (ref 0.0–3.0)
Eosinophils Absolute: 0.2 10*3/uL (ref 0.0–0.7)
Eosinophils Relative: 3.3 % (ref 0.0–5.0)
HCT: 44.5 % (ref 39.0–52.0)
Hemoglobin: 15.2 g/dL (ref 13.0–17.0)
Lymphocytes Relative: 41.9 % (ref 12.0–46.0)
Lymphs Abs: 2.6 10*3/uL (ref 0.7–4.0)
MCHC: 34.2 g/dL (ref 30.0–36.0)
MCV: 96 fl (ref 78.0–100.0)
Monocytes Absolute: 0.6 10*3/uL (ref 0.1–1.0)
Monocytes Relative: 9.8 % (ref 3.0–12.0)
Neutro Abs: 2.7 10*3/uL (ref 1.4–7.7)
Neutrophils Relative %: 43.8 % (ref 43.0–77.0)
Platelets: 182 10*3/uL (ref 150.0–400.0)
RBC: 4.63 Mil/uL (ref 4.22–5.81)
RDW: 13.4 % (ref 11.5–15.5)
WBC: 6.3 10*3/uL (ref 4.0–10.5)

## 2022-02-21 LAB — COMPREHENSIVE METABOLIC PANEL
ALT: 15 U/L (ref 0–53)
AST: 22 U/L (ref 0–37)
Albumin: 4 g/dL (ref 3.5–5.2)
Alkaline Phosphatase: 41 U/L (ref 39–117)
BUN: 25 mg/dL — ABNORMAL HIGH (ref 6–23)
CO2: 27 mEq/L (ref 19–32)
Calcium: 9 mg/dL (ref 8.4–10.5)
Chloride: 106 mEq/L (ref 96–112)
Creatinine, Ser: 1.08 mg/dL (ref 0.40–1.50)
GFR: 69.83 mL/min (ref >60.00)
Glucose, Bld: 94 mg/dL (ref 70–99)
Potassium: 4 mEq/L (ref 3.5–5.1)
Sodium: 140 mEq/L (ref 135–145)
Total Bilirubin: 0.9 mg/dL (ref 0.2–1.2)
Total Protein: 7.4 g/dL (ref 6.0–8.3)

## 2022-02-21 LAB — CALPROTECTIN, FECAL: Calprotectin, Fecal: 12 ug/g (ref 0–120)

## 2022-02-21 LAB — SEDIMENTATION RATE: Sed Rate: 7 mm/hr (ref 0–20)

## 2022-02-21 LAB — C-REACTIVE PROTEIN: CRP: <1 mg/dL (ref 0.5–20.0)

## 2022-02-21 MED ORDER — REPATHA SURECLICK 140 MG/ML ~~LOC~~ SOAJ: 140.0000 mg | SUBCUTANEOUS | 3 refills | Status: DC

## 2022-02-21 NOTE — Addendum Note (Signed)
Addended by: Rockne Menghini on: 02/21/2022 05:01 PM   Modules accepted: Orders

## 2022-02-21 NOTE — Telephone Encounter (Signed)
Pharmacy Patient Advocate Encounter  Prior Authorization for REPATHA 140 MG/ML INJ has been approved.    Effective dates: 02/18/22 through 08/19/22  Karie Soda, [place] Patient Advocate Specialist Direct Number: [phone] Fax: [phone]

## 2022-02-28 ENCOUNTER — Encounter: Payer: Self-pay | Admitting: Gastroenterology

## 2022-02-28 ENCOUNTER — Ambulatory Visit (INDEPENDENT_AMBULATORY_CARE_PROVIDER_SITE_OTHER): Payer: Medicare Other | Admitting: Gastroenterology

## 2022-02-28 VITALS — BP 110/70 | HR 64 | Ht 68.0 in | Wt 149.4 lb

## 2022-02-28 DIAGNOSIS — K5190 Ulcerative colitis, unspecified, without complications: Secondary | ICD-10-CM

## 2022-02-28 NOTE — Patient Instructions (Addendum)
Your provider has requested that you go to the basement level for lab work before leaving today. Press "B" on the elevator. The lab is located at the first door on the left as you exit the elevator.   Due March 8 before next Remicade treatment  We have sent the following medications to your pharmacy for you to pick up at your convenience:  Mesalamine   Due to recent changes in healthcare laws, you may see the results of your imaging and laboratory studies on MyChart before your provider has had a chance to review them.  We understand that in some cases there may be results that are confusing or concerning to you. Not all laboratory results come back in the same time frame and the provider may be waiting for multiple results in order to interpret others.  Please give [place] 48 hours in order for your provider to thoroughly review all the results before contacting the office for clarification of your results.    Thank you for choosing [facility]  Karleen Hampshire Nand[place]am,MD

## 2022-02-28 NOTE — Progress Notes (Signed)
Joseph Booker    UG:[id number]    [birth year]  Primary Care Physician:Burns, Claudina Lick, MD  Referring Physician: Binnie Rail, MD [place],  [place] [address]   Chief complaint:  UC  HPI:  72 year old very pleasant gentleman here for follow up visit for ulcerative colitis  Overall he is doing much better.  He feels his symptoms are starting to improve.  He is no longer having rectal bleeding.  He continues to have occasional fecal urgency and less formed stool, thinks it is related to his diet.  Fecal calprotectin improved to within normal range on most recent check February 16, 2022 He is on infliximab infusions 5 mg/kg  GI Hx from previous visits: He was in clinical remission on daily mesalamine for almost past 17 years with symptoms of acute severe UC flare since June 2023   He did not have any improvement on prednisone 40 mg daily.  GI stool pathogen panel and C. difficile negative X 2   He is currently on infliximab 5 mg/kg Random drug level for infliximab on 09/28/2021 was 39 with undetectable antibodies Infliximab drug trough 10 with detectable antiinfliximab antibody 48   His symptoms are slowly improving, he noticing some improvement after the fourth infusion of Remicade.  He is not seeing much rectal bleeding or mucus discharge, bowel habits are also improving.  He is able to explore more foods and is able to tolerate them.     Relevant GI history and workup: CMV PCR negative   CRP 1.53 and sed rate 8 on September 16, 2021   Fecal calprotectin 550 on September 21, 2021   Flexible sigmoidoscopy 09/28/2021 - The perianal and digital rectal examinations were normal. - A continuous area of ulcerated mucosa with stigmata of recent bleeding was present in the rectum extending to rectosigmoid. Biopsies were taken with a cold forceps for histology. - Normal mucosa was found sigmoid colon to visualized extent in the descending colon. Retroflexion not  performed in the rectum due to proctitis.   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     He is tolerating mesalamine enema but is unable to retain it for more than 2 hours at a time.  Initially when it was tried few months ago he had severe reaction to it but does not have it anymore   He had a mild flare in April 2022, he was treated with tapered dose of budesonide.  After taking it for 2 months he was experiencing side effects and has since stopped taking it. Prior to the episode of UC flare in April, he was on chronic antibiotics for ocular rosacea.     Colonoscopy April 17, 2020 - The perianal and digital rectal examinations were normal. - Inflammation characterized by altered vascularity, congestion (edema), erythema and scarring was found as medium patches surrounded by normal mucosa in the rectum, in the descending colon, in the splenic flexure, in the transverse colon, in the ascending colon and at the cecum. This was mild in severity, and when compared to previous examinations, the findings are worsened. Biopsies were taken with a cold forceps for histology. - Non-bleeding internal hemorrhoids were found during retroflexion. The hemorrhoids were medium-sized.   1.  Surgical [P], right colon - MILDLY ACTIVE CHRONIC COLITIS, CONSISTENT WITH PATIENT'S CLINICAL HISTORY OF ULCERATIVE COLITIS - NEGATIVE FOR GRANULOMAS OR DYSPLASIA 2. Surgical [P], left colon - COLONIC MUCOSA WITH NO SPECIFIC HISTOPATHOLOGIC CHANGES - NEGATIVE FOR ACUTE INFLAMMATION, FEATURES OF CHRONICITY, GRANULOMAS OR DYSPLASIA   Outpatient Encounter Medications as of 02/28/2022  Medication Sig   aspirin 81 MG tablet Take 81 mg by mouth daily.   Calcium Carb-Cholecalciferol (CALCIUM+D3) 600-20 MG-MCG TABS  Take 1 tablet by mouth daily.   clopidogrel (PLAVIX) 75 MG tablet Take 1 tablet by mouth once daily (Patient taking differently: Take 75 mg by mouth daily.)   Coenzyme Q10 (CO Q-10) 300 MG CAPS Take 300 mg by mouth every morning.   Evolocumab (REPATHA SURECLICK) [id number] MG/ML SOAJ Inject 140 mg into the skin every 14 (fourteen) days. INJECT 140 MG INTO THE SKIN EVERY 14 DAYS Strength: 140 mg/mL   inFLIXimab (REMICADE IV) Inject 300 mg into the vein. AS DIRECTED   latanoprost (XALATAN) 0.005 % ophthalmic solution Place 1 drop into the left eye daily.   mesalamine (LIALDA) 1.2 g EC tablet Take 4 tablets (4.8 g total) by mouth daily with breakfast.   Multiple Vitamins-Minerals (MULTIVITAMIN WITH MINERALS) tablet Take 1 tablet by mouth daily.   timolol (TIMOPTIC) 0.5 % ophthalmic solution Place 1 drop into both eyes 2 (two) times daily.    mesalamine (ROWASA) 4 g enema Place 60 mLs (4 g total) rectally at bedtime. (Patient not taking: Reported on 02/28/2022)   [DISCONTINUED] promethazine-dextromethorphan (PROMETHAZINE-DM) 6.25-15 MG/5ML syrup Take 5 mLs by mouth 4 (four) times daily as needed for cough.   No facility-administered encounter medications on file as of 02/28/2022.    Allergies as of 02/28/2022 - Review Complete 02/28/2022  Allergen Reaction Noted   Lipitor [atorvastatin] Other (See Comments) 04/04/2019    Past Medical History:  Diagnosis Date   Allergic rhinitis    Allergy    Anxiety state, unspecified    Ascending aortic aneurysm ([facility]) 05/15/2020   Bradycardia 05/03/2021   CAD (coronary artery disease)    [name] / nuclear 2005, excellent tolerance no scar or ischemia, EF 51% / nuclear, February, 2012, no scar or ischemia / catheterization March 26, 2010.. 10% in-stent restenosis, minimal other nonobstructive coronary disease, ejection fraction 55%., excellent result; ETT 9/13: ex 13:14, no CP, no ischemic ECG changes   Cancer (HCC)    basal cell carcinoma x2 on face    Cataract    surgery to both eyes   Elevated blood pressure reading 05/15/2020   Glaucoma, left eye    left eye   History of exercise intolerance 05/23/2013   cardiopulmonary exercise test-- normal functional capacity when compared to matched sedentary norms, borderline ventilatory limitation with the patient's exercise tidal volume reaching 90% of the measured inspiratory capacity ( pt reports no dyspnea), did not appear to be circulatory limitation, normal CPX test   Hyperlipidemia    Nocturia    Osteopenia    Pure hypercholesterolemia 06/12/2009   Thoracic ascending aortic aneurysm [facility])    followed by dr Skeet Latch---  last CT 12-29-2016 ,  4.5cm   Ulcerative colitis, universal ([place])    GI-- [name]--  dx 1990s  (per note mild Mayo Score 1)   Wears glasses     Past Surgical History:  Procedure Laterality Date   CARDIAC CATHETERIZATION  03/26/2010    dr Dian Queen   single vessel CAD w/ patent mLAD stent w/ 10% in-stent restenosis w/ mild  to moderate disease in the remainder LAD w/ no lesions that appearedto be flow-limitinf/  normal LVFSF, ef 55%   CARDIOVASCULAR STRESS TEST  06-30-2016   [name]   Low risk nuclear study w/ no evidence ishcemia/  normal LV function and wall motion,  nuclear stress ef 55%   COLONOSCOPY  last one 10-27-2016   CORONARY ANGIOPLASTY WITH STENT PLACEMENT  07-25-2002   [name]   PTCA w/  DES x1 to [place] Left 2006   INGUINAL HERNIA REPAIR Right 07/03/2017   Procedure: OPEN REPAIR OF RIGHT INGUINAL HERNIA WITH MESH;  Surgeon: Kinsinger, Arta Bruce, MD;  Location: [place];  Service: General;  Laterality: Right;   INSERTION OF MESH Right 07/03/2017   Procedure: INSERTION OF MESH;  Surgeon: Kieth Brightly Arta Bruce, MD;  Location: [place];  Service: General;  Laterality: Right;   LEFT HEART CATH AND CORONARY ANGIOGRAPHY N/A 09/11/2020   Procedure: LEFT HEART CATH AND CORONARY ANGIOGRAPHY;   Surgeon: Jettie Booze, MD;  Location: [place] [facility];  Service: Cardiovascular;  Laterality: N/A;   SEPTOPLASTY  1987   w/ RHINOPLASTY   THORACIC AORTOGRAM N/A 09/11/2020   Procedure: THORACIC AORTOGRAM;  Surgeon: Jettie Booze, MD;  Location: [place] [facility];  Service: Cardiovascular;  Laterality: N/A;   TONSILLECTOMY  1959   TRANSTHORACIC ECHOCARDIOGRAM  06-30-2016     dr Alison Murray 55-60%/  trivial AR/ mild dilated ascending aorta, 76m/  trivial MR/  mild PR and TR     Family History  Problem Relation Age of Onset   Diabetes Mother    Heart disease Mother    Lung disease Mother        ILD   Rheum arthritis Mother    CAD Mother    Heart disease Father 545      MI   Heart attack Father    Asthma Sister    Colon cancer Neg Hx    Esophageal cancer Neg Hx    Pancreatic cancer Neg Hx    Kidney disease Neg Hx    Liver disease Neg Hx    Rectal cancer Neg Hx    Stomach cancer Neg Hx     Social History   Socioeconomic History   Marital status: Married    Spouse name: Not on file   Number of children: 1   Years of education: Not on file   Highest education level: Not on file  Occupational History   Occupation: retired    [employer] farm manager RETIRED  Tobacco Use   Smoking status: Never   Smokeless tobacco: Never   Tobacco comments:    Married, 1 stepson, 5 g-kids. Retired aEmergency planning/management officer now vEnvironmental health practitionerUse: Never used  Substance and Sexual Activity   Alcohol use: Yes    Comment: occasional wine   Drug use: No   Sexual activity: Yes    Partners: Female  Other Topics Concern   Not on file  Social History Narrative   Exercising regularly: walks at least 5 miles a day, 6 days a week   Social Determinants of HRadio broadcast assistantStrain: Not on file  Food Insecurity: Not on file  Transportation Needs: Not on file  Physical Activity: Not on file  Stress: Not on file  Social Connections: Not on file  Intimate Partner Violence:  Not on file      Review  of systems: All other review of systems negative except as mentioned in the HPI.   Physical Exam: Vitals:   02/28/22 1107  BP: 110/70  Pulse: 64   Body mass index is 22.71 kg/m. Gen:      No acute distress HEENT:  sclera anicteric Abd:      soft, non-tender; no palpable masses, no distension Ext:    No edema Neuro: alert and oriented x 3 Psych: normal mood and affect  Data Reviewed:  Reviewed labs, radiology imaging, old records and pertinent past GI work up   Assessment and Plan/Recommendations:  72 year old very pleasant gentleman with chronic ulcerative colitis, acute flare of symptoms in 2023, steroid-dependent,  is currently on Remicade 5 mg/kg with slow improvement of symptoms He is currently on maintenance dose of infliximab and mesalamine He is clinically in remission, plan to continue current regimen If symptoms continue to remain stable, will plan to decrease mesalamine dose and possibly taper off  He had low detectable antibodies to infliximab, will recheck drug trough and antibody level prior to next infusion   Return in 3 months  The patient was provided an opportunity to ask questions and all were answered. The patient agreed with the plan and demonstrated an understanding of the instructions.  Damaris Hippo , MD    CC: Binnie Rail, MD

## 2022-03-12 ENCOUNTER — Other Ambulatory Visit (HOSPITAL_BASED_OUTPATIENT_CLINIC_OR_DEPARTMENT_OTHER): Payer: Self-pay | Admitting: Cardiovascular Disease

## 2022-03-14 NOTE — Telephone Encounter (Signed)
Rx(s) sent to pharmacy electronically.  

## 2022-03-24 ENCOUNTER — Encounter: Payer: Self-pay | Admitting: Gastroenterology

## 2022-03-25 ENCOUNTER — Other Ambulatory Visit: Payer: Medicare Other

## 2022-03-25 DIAGNOSIS — K5190 Ulcerative colitis, unspecified, without complications: Secondary | ICD-10-CM

## 2022-03-28 ENCOUNTER — Ambulatory Visit (INDEPENDENT_AMBULATORY_CARE_PROVIDER_SITE_OTHER): Payer: Medicare Other

## 2022-03-28 VITALS — BP 125/80 | HR 51 | Temp 98.0°F | Resp 18 | Ht 68.0 in | Wt 148.8 lb

## 2022-03-28 DIAGNOSIS — K5100 Ulcerative (chronic) pancolitis without complications: Secondary | ICD-10-CM

## 2022-03-28 MED ORDER — DIPHENHYDRAMINE HCL 25 MG PO CAPS
25.0000 mg | ORAL_CAPSULE | Freq: Once | ORAL | Status: AC
  Administered 2022-03-28: 25 mg via ORAL
  Filled 2022-03-28: qty 1

## 2022-03-28 MED ORDER — METHYLPREDNISOLONE SODIUM SUCC 40 MG IJ SOLR
40.0000 mg | Freq: Once | INTRAMUSCULAR | Status: AC
  Administered 2022-03-28: 40 mg via INTRAVENOUS
  Filled 2022-03-28: qty 1

## 2022-03-28 MED ORDER — SODIUM CHLORIDE 0.9 % IV SOLN
5.0000 mg/kg | INTRAVENOUS | Status: DC
  Administered 2022-03-28: 300 mg via INTRAVENOUS
  Filled 2022-03-28 (×2): qty 30

## 2022-03-28 MED ORDER — ACETAMINOPHEN 325 MG PO TABS
650.0000 mg | ORAL_TABLET | Freq: Once | ORAL | Status: AC
  Administered 2022-03-28: 650 mg via ORAL
  Filled 2022-03-28: qty 2

## 2022-03-28 NOTE — Progress Notes (Signed)
Diagnosis: Ulcerative Colitis  Provider:  Marshell Garfinkel MD  Procedure: Infusion  IV Type: Peripheral, IV Location: L Antecubital  Remicade (Infliximab), Dose: 300 mg  Infusion Start Time: 0939  Infusion Stop Time: 1215  Post Infusion IV Care: Peripheral IV Discontinued  Discharge: Condition: Good, Destination: Home . AVS Declined  Performed by:  Arnoldo Morale, RN

## 2022-03-30 ENCOUNTER — Other Ambulatory Visit (HOSPITAL_COMMUNITY): Payer: Self-pay

## 2022-03-31 LAB — INFLIXIMAB+AB (SERIAL MONITOR)
Anti-Infliximab Antibody: 297 ng/mL
Infliximab Drug Level: 1.3 ug/mL

## 2022-04-04 ENCOUNTER — Encounter: Payer: Self-pay | Admitting: Gastroenterology

## 2022-04-18 ENCOUNTER — Other Ambulatory Visit (HOSPITAL_BASED_OUTPATIENT_CLINIC_OR_DEPARTMENT_OTHER): Payer: Self-pay | Admitting: Cardiovascular Disease

## 2022-04-18 NOTE — Telephone Encounter (Signed)
Rx request sent to pharmacy.  

## 2022-05-12 NOTE — Progress Notes (Signed)
Joseph Booker    [id number]    1950-12-30  Primary Care Physician:Burns, Bobette Mo, MD  Referring Physician: Pincus Sanes, MD [address] [place],  [place] [address]   Chief complaint:  Chief Complaint  Patient presents with   Ulcerative Colitis    Patient still has a lot of urgency to stools. He has around 4 BM's a day. Patient states he has not got back to his regular self since his flare up last year.   HPI: 72 year old very pleasant gentleman here for follow up visit for ulcerative colitis. Fecal calprotectin improved to within normal range on most recent check February 16, 2022 He is on infliximab infusions 5 mg/kg   I last saw him on 02-28-22. At that time, he was doing well overall. His rectal bleeding had improved but continued to have occasional fecal urgency and less formed stool.    Today, he states that his symptoms have improved since last summer but he continues to complain of fecal urgency and loose stools. He reports having one episode of urgency where he believed he was having a flare up. He states that he's been having around 4x BM a day. He reports compliance with Lialda 1.2 g 4x a day.    He states that currently, he's not interested in trying out Rinvoq. He states that he is willing to increase his Remicade dosage.   he denies any constipation, nausea, blood in stool, black stool, vomiting, abdominal pain, bloating, unintentional weight loss, reflux, dysphagia.  GI Hx: He was in clinical remission on daily mesalamine for almost past 17 years with symptoms of acute severe UC flare since June 2023   He did not have any improvement on prednisone 40 mg daily.  GI stool pathogen panel and C. difficile negative X 2   He is currently on infliximab 5 mg/kg Random drug level for infliximab on 09/28/2021 was 39 with undetectable antibodies Infliximab drug trough 10 with detectable antiinfliximab antibody 48   His symptoms are slowly improving, he noticing  some improvement after the fourth infusion of Remicade.  He is not seeing much rectal bleeding or mucus discharge, bowel habits are also improving. He is able to explore more foods and is able to tolerate them.   CMV PCR negative   CRP 1.53 and sed rate 8 on September 16, 2021   Fecal calprotectin 550 on September 21, 2021   Flexible sigmoidoscopy 09/28/2021 - The perianal and digital rectal examinations were normal. - A continuous area of ulcerated mucosa with stigmata of recent bleeding was present in the rectum extending to rectosigmoid. Biopsies were taken with a cold forceps for histology. - Normal mucosa was found sigmoid colon to visualized extent in the descending colon. Retroflexion not performed in the rectum due to proctitis. 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 Circumferential wall thickening involving the distal sigmoid colon extending the level of the rectum not resulting in enteric obstruction. Findings are nonspecific though could be seen provided history of ulcerative colitis. Clinical correlation is advised. If not recently performed, further evaluation with colonoscopy could be performed as indicated. 2. Suspected hepatic steatosis.  Correlation with LFTs is advised. 3. Prostatomegaly with mass effect on the undersurface of the urinary bladder and diffuse thickening of the urinary bladder wall. Correlation for urinary obstructive symptoms is advised. If not recently performed, further  evaluation with DRE could as indicated. 4. Aortic Atherosclerosis (ICD10-I70.0).  He is tolerating mesalamine enema but is unable to retain it for more than 2 hours at a time.  Initially when it was tried few months ago he had severe reaction to it but does not have it anymore   He had a mild flare in April 2022, he was treated with tapered dose of budesonide.  After taking it for 2 months he  was experiencing side effects and has since stopped taking it. Prior to the episode of UC flare in April, he was on chronic antibiotics for ocular rosacea.     Colonoscopy April 17, 2020 - The perianal and digital rectal examinations were normal. - Inflammation characterized by altered vascularity, congestion (edema), erythema and scarring was found as medium patches surrounded by normal mucosa in the rectum, in the descending colon, in the splenic flexure, in the transverse colon, in the ascending colon and at the cecum. This was mild in severity, and when compared to previous examinations, the findings are worsened. Biopsies were taken with a cold forceps for histology. - Non-bleeding internal hemorrhoids were found during retroflexion. The hemorrhoids were medium-sized.   1. Surgical [P], right colon - MILDLY ACTIVE CHRONIC COLITIS, CONSISTENT WITH PATIENT'S CLINICAL HISTORY OF ULCERATIVE COLITIS - NEGATIVE FOR GRANULOMAS OR DYSPLASIA 2. Surgical [P], left colon - COLONIC MUCOSA WITH NO SPECIFIC HISTOPATHOLOGIC CHANGES - NEGATIVE FOR ACUTE INFLAMMATION, FEATURES OF CHRONICITY, GRANULOMAS OR DYSPLASIA   Current Outpatient Medications:    aspirin 81 MG tablet, Take 81 mg by mouth daily., Disp: , Rfl:    Calcium Carb-Cholecalciferol (CALCIUM+D3) 600-20 MG-MCG TABS, Take 1 tablet by mouth daily., Disp: , Rfl:    clopidogrel (PLAVIX) 75 MG tablet, TAKE 1 TABLET BY MOUTH ONCE  DAILY, Disp: 90 tablet, Rfl: 2   Coenzyme Q10 (CO Q-10) 300 MG CAPS, Take 300 mg by mouth every morning., Disp: , Rfl:    inFLIXimab (REMICADE IV), Inject 300 mg into the vein. AS DIRECTED, Disp: , Rfl:    latanoprost (XALATAN) 0.005 % ophthalmic solution, Place 1 drop into the left eye daily., Disp: , Rfl:    mesalamine (LIALDA) 1.2 g EC tablet, Take 4 tablets (4.8 g total) by mouth daily with breakfast., Disp: 120 tablet, Rfl: 11   mesalamine (ROWASA) 4 g enema, Place 60 mLs (4 g total) rectally at bedtime., Disp: 30  mL, Rfl: 0   Multiple Vitamins-Minerals (MULTIVITAMIN WITH MINERALS) tablet, Take 1 tablet by mouth daily., Disp: , Rfl:    REPATHA SURECLICK 140 MG/ML SOAJ, INJECT 140 MG INTO THE SKIN EVERY 14 DAYS, Disp: 2 mL, Rfl: 0   timolol (TIMOPTIC) 0.5 % ophthalmic solution, Place 1 drop into both eyes 2 (two) times daily. , Disp: , Rfl:     Allergies as of 05/18/2022 - Review Complete 05/18/2022  Allergen Reaction Noted   Lipitor [atorvastatin] Other (See Comments) 04/04/2019    Past Medical History:  Diagnosis Date   Allergic rhinitis    Allergy    Anxiety state, unspecified    Ascending aortic aneurysm (HCC) 05/15/2020   Bradycardia 05/03/2021   CAD (coronary artery disease)    [name] / nuclear 2005, excellent tolerance no scar or ischemia, EF 51% / nuclear, February, 2012, no scar or ischemia / catheterization March 26, 2010.. 10% in-stent restenosis, minimal other nonobstructive coronary disease, ejection fraction 55%., excellent result; ETT 9/13: ex 13:14, no CP, no ischemic ECG changes   Cancer (HCC)    basal  cell carcinoma x2 on face   Cataract    surgery to both eyes   Elevated blood pressure reading 05/15/2020   Glaucoma, left eye    left eye   History of exercise intolerance 05/23/2013   cardiopulmonary exercise test-- normal functional capacity when compared to matched sedentary norms, borderline ventilatory limitation with the patient's exercise tidal volume reaching 90% of the measured inspiratory capacity ( pt reports no dyspnea), did not appear to be circulatory limitation, normal CPX test   Hyperlipidemia    Nocturia    Osteopenia    Pure hypercholesterolemia 06/12/2009   Thoracic ascending aortic aneurysm [facility])    followed by dr Chilton Si---  last CT 12-29-2016 ,  4.5cm   Ulcerative colitis, universal (HCC)    GI-- dr Lavon Paganini--  dx 1990s  (per note mild Mayo Score 1)   Wears glasses     Past Surgical History:  Procedure Laterality Date   CARDIAC  CATHETERIZATION  03/26/2010    dr Milton Ferguson   single vessel CAD w/ patent mLAD stent w/ 10% in-stent restenosis w/ mild to moderate disease in the remainder LAD w/ no lesions that appearedto be flow-limitinf/  normal LVFSF, ef 55%   CARDIOVASCULAR STRESS TEST  06-30-2016   dr Eden Emms   Low risk nuclear study w/ no evidence ishcemia/  normal LV function and wall motion,  nuclear stress ef 55%   COLONOSCOPY  last one 10-27-2016   CORONARY ANGIOPLASTY WITH STENT PLACEMENT  07-25-2002   dr Amil Amen   PTCA w/  DES x1 to mLAD   INGUINAL HERNIA REPAIR Left 2006   INGUINAL HERNIA REPAIR Right 07/03/2017   Procedure: OPEN REPAIR OF RIGHT INGUINAL HERNIA WITH MESH;  Surgeon: Kinsinger, De Blanch, MD;  Location: [facility];  Service: General;  Laterality: Right;   INSERTION OF MESH Right 07/03/2017   Procedure: INSERTION OF MESH;  Surgeon: Sheliah Hatch De Blanch, MD;  Location: [facility];  Service: General;  Laterality: Right;   LEFT HEART CATH AND CORONARY ANGIOGRAPHY N/A 09/11/2020   Procedure: LEFT HEART CATH AND CORONARY ANGIOGRAPHY;  Surgeon: Corky Crafts, MD;  Location: [facility] INVASIVE CV LAB;  Service: Cardiovascular;  Laterality: N/A;   SEPTOPLASTY  1987   w/ RHINOPLASTY   THORACIC AORTOGRAM N/A 09/11/2020   Procedure: THORACIC AORTOGRAM;  Surgeon: Corky Crafts, MD;  Location: [facility] INVASIVE CV LAB;  Service: Cardiovascular;  Laterality: N/A;   TONSILLECTOMY  1959   TRANSTHORACIC ECHOCARDIOGRAM  06-30-2016     dr Duane Boston 55-60%/  trivial AR/ mild dilated ascending aorta, 76mm/  trivial MR/  mild PR and TR     Family History  Problem Relation Age of Onset   Diabetes Mother    Heart disease Mother    Lung disease Mother        ILD   Rheum arthritis Mother    CAD Mother    Heart disease Father 50       MI   Heart attack Father    Asthma Sister    Colon cancer Neg Hx    Esophageal cancer Neg Hx    Pancreatic cancer Neg Hx    Kidney disease Neg Hx     Liver disease Neg Hx    Rectal cancer Neg Hx    Stomach cancer Neg Hx     Social History   Socioeconomic History   Marital status: Married    Spouse name: Not on file   Number  of children: 1   Years of education: Not on file   Highest education level: Not on file  Occupational History   Occupation: retired    Associate Professor: RETIRED  Tobacco Use   Smoking status: Never   Smokeless tobacco: Never   Tobacco comments:    Married, 1 stepson, 5 g-kids. Retired Buyer, retail, now Clinical cytogeneticist Use: Never used  Substance and Sexual Activity   Alcohol use: Yes    Comment: occasional wine   Drug use: No   Sexual activity: Yes    Partners: Female  Other Topics Concern   Not on file  Social History Narrative   Exercising regularly: walks at least 5 miles a day, 6 days a week   Social Determinants of Corporate investment banker Strain: Not on file  Food Insecurity: Not on file  Transportation Needs: Not on file  Physical Activity: Not on file  Stress: Not on file  Social Connections: Not on file  Intimate Partner Violence: Not on file     Review of systems: Review of Systems  Constitutional:  Negative for unexpected weight change.  HENT:  Negative for trouble swallowing.   Gastrointestinal:  Positive for diarrhea (loose stools). Negative for abdominal distention, abdominal pain, anal bleeding, blood in stool, constipation, nausea, rectal pain and vomiting.  Genitourinary:  Positive for urgency (fecal).      Physical Exam: Vitals:   05/18/22 1040  BP: 112/62  Pulse: (!) 56    Body mass index is 22.48 kg/m. General: well-appearing   Eyes: sclera anicteric, no redness Skin; warm and dry, no rash or jaundice noted Neuro: awake, alert and oriented x 3. Normal gross motor function and fluent speech   Data Reviewed:  Reviewed labs, radiology imaging, old records and pertinent past GI work up   Assessment and Plan/Recommendations:  72 year old  very pleasant gentleman with chronic ulcerative colitis, acute flare of symptoms in 2023, steroid-dependent,  is currently on Remicade 5 mg/kg with slow improvement of symptoms He is currently on maintenance dose of infliximab and mesalamine  He is having slightly increased frequency of fecal urgency suggestive of persistent proctitis Remicade drug trough is trending down with increase in antibody level, he is starting to lose clinical response, will try to recapture with increasing dose of Remicade to 10 mg/kg Will send a request infusion center  Repeat CBC, CMP, CRP and fecal calprotectin in 2 months prior to the next appointment Return in 3 months  The patient was provided an opportunity to ask questions and all were answered. The patient agreed with the plan and demonstrated an understanding of the instructions.  Iona Beard , MD    CC: Pincus Sanes, MD   Ladona Mow Hewitt Shorts as a scribe for Marsa Aris, MD.,have documented all relevant documentation on the behalf of Marsa Aris, MD,as directed by  Marsa Aris, MD while in the presence of Marsa Aris, MD.   I, Marsa Aris, MD, have reviewed all documentation for this visit. The documentation on 05/18/22 for the exam, diagnosis, procedures, and orders are all accurate and complete.

## 2022-05-18 ENCOUNTER — Encounter: Payer: Self-pay | Admitting: Gastroenterology

## 2022-05-18 ENCOUNTER — Ambulatory Visit (INDEPENDENT_AMBULATORY_CARE_PROVIDER_SITE_OTHER): Payer: Medicare Other | Admitting: Gastroenterology

## 2022-05-18 VITALS — BP 112/62 | HR 56 | Ht 68.5 in | Wt 150.0 lb

## 2022-05-18 DIAGNOSIS — K5190 Ulcerative colitis, unspecified, without complications: Secondary | ICD-10-CM

## 2022-05-18 NOTE — Patient Instructions (Addendum)
Your provider has requested that you go to the basement level for lab work before leaving today. Press "B" on the elevator. The lab is located at the first door on the left as you exit the elevator.  You will come back for labwork before your next follow up with Dr Lavon Paganini on 7/11 at 9:10am   We will increase infliximab 10 mg/kg May 8th   Due to recent changes in healthcare laws, you may see the results of your imaging and laboratory studies on MyChart before your provider has had a chance to review them.  We understand that in some cases there may be results that are confusing or concerning to you. Not all laboratory results come back in the same time frame and the provider may be waiting for multiple results in order to interpret others.  Please give [place] 48 hours in order for your provider to thoroughly review all the results before contacting the office for clarification of your results.    I appreciate the  opportunity to care for you  Thank You   Marsa Aris , MD

## 2022-05-25 ENCOUNTER — Encounter: Payer: Self-pay | Admitting: Gastroenterology

## 2022-05-26 DIAGNOSIS — D2271 Melanocytic nevi of right lower limb, including hip: Secondary | ICD-10-CM | POA: Diagnosis not present

## 2022-05-26 DIAGNOSIS — L718 Other rosacea: Secondary | ICD-10-CM | POA: Diagnosis not present

## 2022-05-26 DIAGNOSIS — L820 Inflamed seborrheic keratosis: Secondary | ICD-10-CM | POA: Diagnosis not present

## 2022-05-26 DIAGNOSIS — D2239 Melanocytic nevi of other parts of face: Secondary | ICD-10-CM | POA: Diagnosis not present

## 2022-05-26 DIAGNOSIS — D2261 Melanocytic nevi of right upper limb, including shoulder: Secondary | ICD-10-CM | POA: Diagnosis not present

## 2022-05-26 DIAGNOSIS — D2262 Melanocytic nevi of left upper limb, including shoulder: Secondary | ICD-10-CM | POA: Diagnosis not present

## 2022-05-26 DIAGNOSIS — D2272 Melanocytic nevi of left lower limb, including hip: Secondary | ICD-10-CM | POA: Diagnosis not present

## 2022-05-26 DIAGNOSIS — D485 Neoplasm of uncertain behavior of skin: Secondary | ICD-10-CM | POA: Diagnosis not present

## 2022-05-26 DIAGNOSIS — L570 Actinic keratosis: Secondary | ICD-10-CM | POA: Diagnosis not present

## 2022-05-26 DIAGNOSIS — L821 Other seborrheic keratosis: Secondary | ICD-10-CM | POA: Diagnosis not present

## 2022-05-30 ENCOUNTER — Ambulatory Visit (INDEPENDENT_AMBULATORY_CARE_PROVIDER_SITE_OTHER): Payer: Medicare Other

## 2022-05-30 ENCOUNTER — Other Ambulatory Visit: Payer: Self-pay | Admitting: Pharmacy Technician

## 2022-05-30 VITALS — BP 116/76 | HR 48 | Temp 98.5°F | Resp 18 | Ht 68.0 in | Wt 148.6 lb

## 2022-05-30 DIAGNOSIS — K5100 Ulcerative (chronic) pancolitis without complications: Secondary | ICD-10-CM

## 2022-05-30 MED ORDER — METHYLPREDNISOLONE SODIUM SUCC 40 MG IJ SOLR
40.0000 mg | Freq: Once | INTRAMUSCULAR | Status: AC
  Administered 2022-05-30: 40 mg via INTRAVENOUS
  Filled 2022-05-30: qty 1

## 2022-05-30 MED ORDER — SODIUM CHLORIDE 0.9 % IV SOLN
5.0000 mg/kg | INTRAVENOUS | Status: DC
  Administered 2022-05-30: 300 mg via INTRAVENOUS
  Filled 2022-05-30: qty 30

## 2022-05-30 MED ORDER — DIPHENHYDRAMINE HCL 25 MG PO CAPS
25.0000 mg | ORAL_CAPSULE | Freq: Once | ORAL | Status: AC
  Administered 2022-05-30: 25 mg via ORAL
  Filled 2022-05-30: qty 1

## 2022-05-30 MED ORDER — ACETAMINOPHEN 325 MG PO TABS
650.0000 mg | ORAL_TABLET | Freq: Once | ORAL | Status: AC
  Administered 2022-05-30: 650 mg via ORAL
  Filled 2022-05-30: qty 2

## 2022-05-30 NOTE — Progress Notes (Signed)
Diagnosis: Ulcerative Colitis  Provider:  Chilton Greathouse MD  Procedure: IV Infusion  IV Type: Peripheral, IV Location: L Antecubital  Remicade (Infliximab), Dose: 300 mg  Infusion Start Time: 1045  Infusion Stop Time: 1258  Post Infusion IV Care: Peripheral IV Discontinued  Discharge: Condition: Good, Destination: Home . AVS Declined  Performed by:  Adriana Mccallum, RN

## 2022-06-02 ENCOUNTER — Other Ambulatory Visit (HOSPITAL_BASED_OUTPATIENT_CLINIC_OR_DEPARTMENT_OTHER): Payer: Self-pay | Admitting: Cardiovascular Disease

## 2022-06-02 MED ORDER — REPATHA SURECLICK 140 MG/ML ~~LOC~~ SOAJ: 140.0000 mg | SUBCUTANEOUS | 6 refills | Status: DC

## 2022-06-02 MED ORDER — REPATHA SURECLICK 140 MG/ML ~~LOC~~ SOAJ: 140.0000 mg | SUBCUTANEOUS | 3 refills | Status: DC

## 2022-06-02 NOTE — Addendum Note (Signed)
Addended by: Marlene Lard on: 06/02/2022 04:09 PM   Modules accepted: Orders

## 2022-06-02 NOTE — Addendum Note (Signed)
Addended by: Marlene Lard on: 06/02/2022 04:08 PM   Modules accepted: Orders

## 2022-06-02 NOTE — Addendum Note (Signed)
Addended by: Marlene Lard on: 06/02/2022 03:58 PM   Modules accepted: Orders

## 2022-06-02 NOTE — Telephone Encounter (Signed)
Rx request sent to pharmacy.  

## 2022-06-06 ENCOUNTER — Other Ambulatory Visit: Payer: Self-pay | Admitting: Gastroenterology

## 2022-06-07 DIAGNOSIS — D2272 Melanocytic nevi of left lower limb, including hip: Secondary | ICD-10-CM | POA: Diagnosis not present

## 2022-06-07 DIAGNOSIS — D485 Neoplasm of uncertain behavior of skin: Secondary | ICD-10-CM | POA: Diagnosis not present

## 2022-06-15 DIAGNOSIS — Z961 Presence of intraocular lens: Secondary | ICD-10-CM | POA: Diagnosis not present

## 2022-06-15 DIAGNOSIS — H401123 Primary open-angle glaucoma, left eye, severe stage: Secondary | ICD-10-CM | POA: Diagnosis not present

## 2022-06-15 DIAGNOSIS — H401111 Primary open-angle glaucoma, right eye, mild stage: Secondary | ICD-10-CM | POA: Diagnosis not present

## 2022-06-20 ENCOUNTER — Encounter (HOSPITAL_BASED_OUTPATIENT_CLINIC_OR_DEPARTMENT_OTHER): Payer: Self-pay | Admitting: Cardiovascular Disease

## 2022-06-20 ENCOUNTER — Ambulatory Visit (INDEPENDENT_AMBULATORY_CARE_PROVIDER_SITE_OTHER): Payer: Medicare Other | Admitting: Cardiovascular Disease

## 2022-06-20 VITALS — BP 138/92 | HR 50 | Ht 68.0 in | Wt 148.4 lb

## 2022-06-20 DIAGNOSIS — R079 Chest pain, unspecified: Secondary | ICD-10-CM | POA: Diagnosis not present

## 2022-06-20 DIAGNOSIS — Z01812 Encounter for preprocedural laboratory examination: Secondary | ICD-10-CM | POA: Diagnosis not present

## 2022-06-20 DIAGNOSIS — I2510 Atherosclerotic heart disease of native coronary artery without angina pectoris: Secondary | ICD-10-CM | POA: Diagnosis not present

## 2022-06-20 DIAGNOSIS — I77810 Thoracic aortic ectasia: Secondary | ICD-10-CM | POA: Diagnosis not present

## 2022-06-20 DIAGNOSIS — E7800 Pure hypercholesterolemia, unspecified: Secondary | ICD-10-CM

## 2022-06-20 NOTE — Progress Notes (Signed)
Cardiology Office Visit    Date:  06/20/2022   ID:  Joseph Booker, DOB 07-20-1950, MRN [id number]  PCP:  Pincus Sanes, MD  Cardiologist:  Chilton Si, MD  Electrophysiologist:  None   Evaluation Performed:  Follow-Up Visit  Chief Complaint:  Hyperlipidemia  History of Present Illness:    Joseph Booker is a 72 y.o. male with CAD, hyperlipidemia, and mild ascending aorta aneurysm here for follow up.  [name] had an LAD PCI in 2004. He had a [employer] 06/2016 that revealed LVEF 55% and no ischemia.  He last saw Dr. Eden Emms 02/15/17 at which time he reported some non-exertional shortness of breath.  This has been an ongoing complaint for years.  He had a CPX in 2015 that was normal.  He saw an allergist who suggested that he might have inflammation in his lungs.  His cholesterol was not well controlled on simvastatin so this was switched to atorvastatin.  However his LDL remained above 70 at 40 mg.  It was increased to 80 mg but he developed transaminitis.  He started on Repatha and tolerated it well.  His liver enzymes have been stable.  He had a repeat CT 04/29/20 that showed a stable ascending aortic aneurysm at 4.5 cm. He reported exertional dyspnea. He had a nuclear stress test 06/2020 that showed LVEF 48% and frequent PVC's. Echo 07/2020 showed LVEF 60-65%. Ascending aorta was 4.3 cm. He was seen in the office 08/2020 with chest pain. He underwent left heart cath 08/2020 which showed a patent LAD stent and otherwise mild disease.  He saw [name], NP 12/2021 was doing well.  Echo at that time revealed LVEF 55-60%.  RV function was low normal.  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  Slightly increased from echo 07/2020.  Today he notes intermittent chest discomfort and excessive sweating during walks. The discomfort is not associated with exercise and can occur during rest. The  patient also reports difficulty in breathing, which he attributes to a hiatal hernia and sensitivity to scents such as perfumes and candles. He also reports numbness in both toes, a symptom that has been present for two to three years. He is an active individual who walks regularly. However, he has noticed increased sweating and possible shortness of breath during these activities. He is unsure if these symptoms are due to exertion or a potential cardiac issue.  [name] also reports a history of numbness in his legs, which was evaluated with MRIs. The results were inconclusive, and he was advised to monitor the symptoms. The numbness has since localized to the toes and is constant. The patient is unsure if this symptom is related to his cardiovascular disease or a separate neurological issue.      Past Medical History:  Diagnosis Date   Allergic rhinitis    Allergy    Anxiety state, unspecified    Ascending aortic aneurysm (HCC) 05/15/2020   Bradycardia 05/03/2021   CAD (coronary artery disease)    [name] / nuclear 2005, excellent tolerance no scar or ischemia, EF 51% / nuclear, February, 2012, no scar or ischemia / catheterization March 26, 2010.. 10% in-stent restenosis, minimal other nonobstructive coronary disease, ejection fraction 55%., excellent result; ETT 9/13: ex 13:14, no CP, no ischemic ECG changes   Cancer (HCC)    basal cell carcinoma x2 on face   Cataract  surgery to both eyes   Elevated blood pressure reading 05/15/2020   Glaucoma, left eye    left eye   History of exercise intolerance 05/23/2013   cardiopulmonary exercise test-- normal functional capacity when compared to matched sedentary norms, borderline ventilatory limitation with the patient's exercise tidal volume reaching 90% of the measured inspiratory capacity ( pt reports no dyspnea), did not appear to be circulatory limitation, normal CPX test   Hyperlipidemia    Nocturia    Osteopenia    Pure  hypercholesterolemia 06/12/2009   Thoracic ascending aortic aneurysm [facility])    followed by dr Chilton Si---  last CT 12-29-2016 ,  4.5cm   Ulcerative colitis, universal (HCC)    GI-- dr Lavon Paganini--  dx 1990s  (per note mild Mayo Score 1)   Wears glasses    Past Surgical History:  Procedure Laterality Date   CARDIAC CATHETERIZATION  03/26/2010    dr Milton Ferguson   single vessel CAD w/ patent mLAD stent w/ 10% in-stent restenosis w/ mild to moderate disease in the remainder LAD w/ no lesions that appearedto be flow-limitinf/  normal LVFSF, ef 55%   CARDIOVASCULAR STRESS TEST  06-30-2016   dr Eden Emms   Low risk nuclear study w/ no evidence ishcemia/  normal LV function and wall motion,  nuclear stress ef 55%   COLONOSCOPY  last one 10-27-2016   CORONARY ANGIOPLASTY WITH STENT PLACEMENT  07-25-2002   dr Amil Amen   PTCA w/  DES x1 to mLAD   INGUINAL HERNIA REPAIR Left 2006   INGUINAL HERNIA REPAIR Right 07/03/2017   Procedure: OPEN REPAIR OF RIGHT INGUINAL HERNIA WITH MESH;  Surgeon: Kinsinger, De Blanch, MD;  Location: [facility];  Service: General;  Laterality: Right;   INSERTION OF MESH Right 07/03/2017   Procedure: INSERTION OF MESH;  Surgeon: Sheliah Hatch De Blanch, MD;  Location: [facility];  Service: General;  Laterality: Right;   LEFT HEART CATH AND CORONARY ANGIOGRAPHY N/A 09/11/2020   Procedure: LEFT HEART CATH AND CORONARY ANGIOGRAPHY;  Surgeon: Corky Crafts, MD;  Location[facility] INVASIVE CV LAB;  Service: Cardiovascular;  Laterality: N/A;   SEPTOPLASTY  1987   w/ RHINOPLASTY   THORACIC AORTOGRAM N/A 09/11/2020   Procedure: THORACIC AORTOGRAM;  Surgeon: Corky Crafts, MD;  Location: [facility] INVASIVE CV LAB;  Service: Cardiovascular;  Laterality: N/A;   TONSILLECTOMY  1959   TRANSTHORACIC ECHOCARDIOGRAM  06-30-2016     dr Duane Boston 55-60%/  trivial AR/ mild dilated ascending aorta, 81mm/  trivial MR/  mild PR and TR      Current Meds  Medication  Sig   aspirin 81 MG tablet Take 81 mg by mouth daily.   Calcium Carb-Cholecalciferol (CALCIUM+D3) 600-20 MG-MCG TABS Take 1 tablet by mouth daily.   clopidogrel (PLAVIX) 75 MG tablet TAKE 1 TABLET BY MOUTH ONCE  DAILY   Coenzyme Q10 (CO Q-10) 300 MG CAPS Take 300 mg by mouth every morning.   Evolocumab (REPATHA SURECLICK) 140 MG/ML SOAJ Inject 140 mg into the skin every 14 (fourteen) days.   inFLIXimab (REMICADE IV) Inject 700 mg into the vein. AS DIRECTED   latanoprost (XALATAN) 0.005 % ophthalmic solution Place 1 drop into the left eye daily.   mesalamine (LIALDA) 1.2 g EC tablet TAKE 4 TABLETS BY MOUTH ONCE  DAILY WITH BREAKFAST   mesalamine (ROWASA) 4 g enema Place 60 mLs (4 g total) rectally at bedtime.   Multiple Vitamins-Minerals (MULTIVITAMIN WITH MINERALS) tablet Take  1 tablet by mouth daily.   timolol (TIMOPTIC) 0.5 % ophthalmic solution Place 1 drop into both eyes 2 (two) times daily.      Allergies:   Lipitor [atorvastatin]   Social History   Tobacco Use   Smoking status: Never   Smokeless tobacco: Never   Tobacco comments:    Married, 1 stepson, 5 g-kids. Retired Buyer, retail, now Clinical cytogeneticist Use: Never used  Substance Use Topics   Alcohol use: Yes    Comment: occasional wine   Drug use: No     Family Hx: The patient's family history includes Asthma in his sister; CAD in his mother; Diabetes in his mother; Heart attack in his father; Heart disease in his mother; Heart disease (age of onset: 36) in his father; Lung disease in his mother; Rheum arthritis in his mother. There is no history of Colon cancer, Esophageal cancer, Pancreatic cancer, Kidney disease, Liver disease, Rectal cancer, or Stomach cancer.  ROS:   Please see the history of present illness.    (+) Diaphoresis (+) Fatigue All other systems reviewed and are negative.   Prior CV studies:   The following studies were reviewed today:  Left Heart Cath/Aortogram 09/11/2020:   Prox  LAD lesion is 40% stenosed seen in cranial views only.  Eccentric lesion.   Prox RCA lesion is 25% stenosed.   Mid LAD-2 lesion is 30% stenosed.   Previously placed Mid LAD-1 stent (unknown type) is  widely patent.   LV end diastolic pressure is normal.   There is no aortic valve stenosis.   Dilated aortic root.   Patent stent.  Mild to moderate scattered nonobstructive CAD.   Dilated aortic root to be followed with serial imaging.   Diagnostic Dominance: Right   Echo 07/17/2020:  1. Left ventricular ejection fraction, by estimation, is 60 to 65%. Left  ventricular ejection fraction by 3D volume is 67 %. The left ventricle has  normal function. The left ventricle has no regional wall motion  abnormalities. Left ventricular diastolic   parameters were normal.   2. Right ventricular systolic function is normal. The right ventricular  size is normal. There is normal pulmonary artery systolic pressure. The  estimated right ventricular systolic pressure is 19.0 mmHg.   3. The mitral valve is normal in structure. Trivial mitral valve  regurgitation. No evidence of mitral stenosis.   4. The aortic valve is tricuspid. Aortic valve regurgitation is mild. No  aortic stenosis is present.   5. Aortic dilatation noted. There is mild dilatation of the ascending  aorta, measuring 43 mm. There is mild dilatation of the aortic root,  measuring 40 mm.   6. The inferior vena cava is normal in size with greater than 50%  respiratory variability, suggesting right atrial pressure of 3 mmHg.   Comparison(s): No significant change from prior study. Prior images  reviewed side by side.   Nuclear Stress Test 06/19/2020: The left ventricular ejection fraction is mildly decreased (45-54%). Nuclear stress EF: 48%. The study is normal. This is a low risk study.   1. Normal perfusion without evidence of ischemia or infarction. 2. Mildly reduced LVEF, 48%. Visually this appears normal. Would recommend an  echocardiogram for better evaluation. 3. Exceptional function capacity (13:01 min:s; 13.6 METS).  4. Frequent PVCs during exercise (with couplets) and lasting into recovery.  5. Normal HR/BP response to exercise.   CT ANGIOGRAPHY CHEST WITH CONTRAST 04/29/2020: IMPRESSION: 1. Stable aneurysmal disease of  the ascending thoracic aorta measuring approximately 4.5 cm in greatest diameter. Ascending thoracic aortic aneurysm. Recommend semi-annual imaging followup by CTA or MRA and referral to cardiothoracic surgery if not already obtained. This recommendation follows 2010 ACCF/AHA/AATS/ACR/ASA/SCA/SCAI/SIR/STS/SVM Guidelines for the Diagnosis and Management of Patients With Thoracic Aortic Disease. Circulation. 2010; 121: Z610-R604. Aortic aneurysm NOS (ICD10-I71.9) 2. Stable evidence of coronary atherosclerosis.  Echo 05/01/2019:   1. Left ventricular ejection fraction, by estimation, is 60 to 65%. The  left ventricle has normal function. The left ventricle has no regional  wall motion abnormalities. Left ventricular diastolic parameters were  normal.   2. Right ventricular systolic function is normal. The right ventricular  size is normal. There is normal pulmonary artery systolic pressure.   3. The mitral valve is normal in structure. Trivial mitral valve  regurgitation. No evidence of mitral stenosis.   4. The aortic valve is tricuspid. Aortic valve regurgitation is trivial.  No aortic stenosis is present.   5. 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 Aortic dilatation noted. There is  moderate dilatation of the aortic root.   6. The inferior vena cava is normal in size with greater than 50%  respiratory variability, suggesting right atrial pressure of 3 mmHg.   Echo 02/14/18:  1. Sinus bradycardia in the upper 40s throughout study.  2. Moderate dilatation of the ascending aorta.  3. Aneurysm of the ascending aorta,  measured at 4.5 cm. Recommend dedidated imaging ot aorta with CT or MRI if clinically indicated and not recently performed.  4. The left ventricle appears to be normal in size, have normal wall thickness, with EF of 60-65%. Echo evidence of normal diastolic filling patterns.  5. GLS -19.9%.  6. Right ventricular systolic pressure is is normal.  7. The right ventricle is normal in size, has normal wall thickness and normal systolic function.  8. Normal left atrial size.  9. Normal right atrial size. 10. Mild mitral annular calcification. 11. Mitral valve regurgitation is mild by color flow Doppler. 12. The mitral valve normal in structure and function. 13. Normal tricuspid valve. 14. Aortic valve normal. 15. Aortic valve regurgitation is mild by color flow Doppler. 16. There is mild calcification of the aortic valve. 17. There is mild thickening of the aortic valve. 18. No atrial level shunt detected by color flow Doppler. 19. Tricuspid regurgitation is mild.  Coronary CT-A 12/29/16:  Ascending aneurysm 4.5 cm. <50% LM stenosis.  Patent proximal LAD stent.    Lexiscan Myoview 06/2016: Nuclear stress EF: 55%. Blood pressure demonstrated a hypertensive response to exercise. There was no ST segment deviation noted during stress. The study is normal. This is a low risk study. The left ventricular ejection fraction is normal (55-65%).   Normal resting and stress perfusion. No ischemia or infarction EF 55%   Echo 06/30/16: Nuclear stress EF: 55%. Blood pressure demonstrated a hypertensive response to exercise. There was no ST segment deviation noted during stress. The study is normal. This is a low risk study. The left ventricular ejection fraction is normal (55-65%).   Normal resting and stress perfusion. No ischemia or infarction EF 55%   CPX 05/23/13: Normal  Labs/Other Tests and Data Reviewed:    EKG:  EKG is personally reviewed. 05/03/2021: Sinus bradycardia. Rate 50 bpm.  LAFB. 09/13/2019: EKG was not ordered. 05/15/2020: Sinus bradycardia. Rate 48 bpm. Left axis deviation. 06/12/2020: EKG is not ordered today.  Recent Labs: 02/21/2022:  ALT 15; BUN 25; Creatinine, Ser 1.08; Hemoglobin 15.2; Platelets 182.0; Potassium 4.0; Sodium 140   Recent Lipid Panel Lab Results  Component Value Date/Time   CHOL 108 12/23/2021 09:11 AM   TRIG 126 12/23/2021 09:11 AM   TRIG 46 12/19/2008 12:00 AM   HDL 42 12/23/2021 09:11 AM   CHOLHDL 2.6 12/23/2021 09:11 AM   CHOLHDL 4 03/25/2016 09:14 AM   LDLCALC 44 12/23/2021 09:11 AM    Wt Readings from Last 3 Encounters:  06/20/22 148 lb 6.4 oz (67.3 kg)  05/30/22 148 lb 9.6 oz (67.4 kg)  05/18/22 150 lb (68 kg)     Objective:    VS:  BP (!) 147/87 (BP Location: Left Arm, Patient Position: Sitting, Cuff Size: Normal)   Pulse (!) 50   Ht 5\' 8"  (1.727 m)   Wt 148 lb 6.4 oz (67.3 kg)   BMI 22.56 kg/m  , BMI Body mass index is 22.56 kg/m. GENERAL:  Well appearing HEENT: Pupils equal round and reactive, fundi not visualized, oral mucosa unremarkable NECK:  No jugular venous distention, waveform within normal limits, carotid upstroke brisk and symmetric, no bruits LUNGS:  Clear to auscultation bilaterally HEART:  Bradycardic.  Regular rhythm.  PMI not displaced or sustained,S1 and S2 within normal limits, no S3, no S4, no clicks, no rubs, no murmurs ABD:  Flat, positive bowel sounds normal in frequency in pitch, no bruits, no rebound, no guarding, no midline pulsatile mass, no hepatomegaly, no splenomegaly EXT:  2 plus pulses throughout, no edema, no cyanosis no clubbing SKIN:  No rashes no nodules NEURO:  Cranial nerves II through XII grossly intact, motor grossly intact throughout PSYCH:  Cognitively intact, oriented to person place and time   ASSESSMENT & PLAN:     Chest discomfort:  Coronary Artery Disease:  Reports infrequent chest discomfort and increased sweating with exertion. No clear association with  exertion. History of stent placement. -Schedule Lexiscan myoview to evaluate for ischemia. -Consider discontinuing Plavix if stress test is normal.  Continue aspirin  Aortic Aneurysm: Last echocardiogram showed slight increase in size.   -Schedule CT aortogram for more accurate assessment of aortic size and for comparison to prior CT.  Elevated BP: Blood pressure slightly elevated at today's visit, but generally well-controlled. -Advise patient to monitor blood pressure at home and report if consistently elevated.  Hyperlipidemia: Well-controlled on Repatha. Last cholesterol panel in December was within target range. -Continue current regimen. -Check cholesterol panel annually.  Follow-up in 6 months or sooner if any abnormal findings on upcoming tests.        Medication Adjustments/Labs and Tests Ordered: Current medicines are reviewed at length with the patient today.  Concerns regarding medicines are outlined above.   Tests Ordered: Orders Placed This Encounter  Procedures   CT ANGIO CHEST AORTA W/CM & OR WO/CM   Basic metabolic panel   MYOCARDIAL PERFUSION IMAGING   Medication Changes: No orders of the defined types were placed in this encounter.   Disposition:  FU in clinic with Sharonlee Nine C. Duke Salvia, MD, [facility] in December 2023.   Signed, Chilton Si, MD  06/20/2022 8:56 AM    [facility]

## 2022-06-20 NOTE — Patient Instructions (Addendum)
Medication Instructions:  Your physician recommends that you continue on your current medications as directed. Please refer to the Current Medication list given to you today.   *If you need a refill on your cardiac medications before your next appointment, please call your pharmacy*  Lab Work: BMET TODAY   If you have labs (blood work) drawn today and your tests are completely normal, you will receive your results only by: MyChart Message (if you have MyChart) OR A paper copy in the mail If you have any lab test that is abnormal or we need to change your treatment, we will call you to review the results.  Testing/Procedures: Your physician has requested that you have en exercise stress myoview. For further information please visit [email or web address] Please follow instruction sheet, as given.  Non-Cardiac CT scanning, (CAT scanning), is a noninvasive, special x-ray that produces cross-sectional images of the body using x-rays and a computer. CT scans help physicians diagnose and treat medical conditions. For some CT exams, a contrast material is used to enhance visibility in the area of the body being studied. CT scans provide greater clarity and reveal more details than regular x-ray exams. CTA CHEST   Follow-Up: At [facility], you and your health needs are our priority.  As part of our continuing mission to provide you with exceptional heart care, we have created designated Provider Care Teams.  These Care Teams include your primary Cardiologist (physician) and Advanced Practice Providers (APPs -  Physician Assistants and Nurse Practitioners) who all work together to provide you with the care you need, when you need it.  We recommend signing up for the patient portal called "MyChart".  Sign up information is provided on this After Visit Summary.  MyChart is used to connect with patients for Virtual Visits (Telemedicine).  Patients are able to view lab/test results, encounter  notes, upcoming appointments, etc.  Non-urgent messages can be sent to your provider as well.   To learn more about what you can do with MyChart, go to [email or web address].    Your next appointment:   6 month(s)  Provider:   Chilton Si, MD    Other Instructions DID CANCEL YOUR 6 MONTH ECHO

## 2022-06-21 LAB — BASIC METABOLIC PANEL
BUN/Creatinine Ratio: 22 (ref 10–24)
BUN: 26 mg/dL (ref 8–27)
CO2: 24 mmol/L (ref 20–29)
Calcium: 9.5 mg/dL (ref 8.6–10.2)
Chloride: 105 mmol/L (ref 96–106)
Creatinine, Ser: 1.16 mg/dL (ref 0.76–1.27)
Glucose: 100 mg/dL — ABNORMAL HIGH (ref 70–99)
Potassium: 4.8 mmol/L (ref 3.5–5.2)
Sodium: 141 mmol/L (ref 134–144)
eGFR: 68 mL/min/{1.73_m2} (ref >59)

## 2022-06-22 ENCOUNTER — Encounter: Payer: Self-pay | Admitting: Gastroenterology

## 2022-06-22 NOTE — Telephone Encounter (Signed)
===  View-only below this line=== ----- Message ----- From: Desma Mcgregor, [facility] Sent: 06/22/2022   4:08 PM EDT To: Evalee Jefferson, LPN; *  Wynelle Cleveland,  Here is the sequence of events.  3/11 - pt got 300mg  (5mg /kg) 5/1 - MD saw patient in clinic and per chart notes, does was increased to 10mg /kg. However, orders weren't changed to 10mg /kg. 5/13 - pt arrived at infusion and dose (5mg /kg) was made. Since he was present and dose was made, I discussed with MD to go ahead and give him that dose.  We then got a new auth for 10mg /kg and got him scheduled for that on 6/10 (4 weeks instead of waiting the normal 8 week).  On 06/27/22, he will get 10mg /kg (700mg ) and then continue that every 8 weeks.  I did personally discuss above plan with patient on 5/13 and he verbalized understanding at the time.  Please let me know if I can help clarify anything further.  Thanks, [employer]

## 2022-06-23 ENCOUNTER — Telehealth (HOSPITAL_COMMUNITY): Payer: Self-pay

## 2022-06-23 NOTE — Telephone Encounter (Signed)
Detailed instructions left on the patient's answering machine. Asked to cal back with any questions. S.Salvatrice Morandi EMT/CCT

## 2022-06-24 ENCOUNTER — Telehealth: Payer: Self-pay | Admitting: Gastroenterology

## 2022-06-24 NOTE — Telephone Encounter (Signed)
Inbound call from patient requesting a call back regarding Remicade infusions. States the dosage is now higher and there is less time in between infusions. He is requesting to discuss if this is something that he can proceed with due to this higher dosage not being discussed with Dr. Lavon Paganini. States he is due for another infusion Monday 6/10. Please advise, thank you.

## 2022-06-24 NOTE — Telephone Encounter (Signed)
Inbound call from patient regarding mychart messages. Informed Dr. Lavon Paganini next available appointment is 10/8. States he does not want to wait that long to be seen and does not want to be seen with an APP. Reuqesting a call back to discuss what options there. Please advise, thank you.

## 2022-06-27 ENCOUNTER — Ambulatory Visit (INDEPENDENT_AMBULATORY_CARE_PROVIDER_SITE_OTHER): Payer: Medicare Other

## 2022-06-27 VITALS — BP 130/78 | HR 52 | Temp 98.0°F | Resp 16 | Ht 68.0 in | Wt 149.0 lb

## 2022-06-27 DIAGNOSIS — K51011 Ulcerative (chronic) pancolitis with rectal bleeding: Secondary | ICD-10-CM

## 2022-06-27 MED ORDER — METHYLPREDNISOLONE SODIUM SUCC 40 MG IJ SOLR
40.0000 mg | Freq: Once | INTRAMUSCULAR | Status: AC
  Administered 2022-06-27: 40 mg via INTRAVENOUS
  Filled 2022-06-27: qty 1

## 2022-06-27 MED ORDER — SODIUM CHLORIDE 0.9 % IV SOLN
10.0000 mg/kg | Freq: Once | INTRAVENOUS | Status: AC
  Administered 2022-06-27: 700 mg via INTRAVENOUS
  Filled 2022-06-27: qty 70

## 2022-06-27 MED ORDER — DIPHENHYDRAMINE HCL 25 MG PO CAPS
25.0000 mg | ORAL_CAPSULE | Freq: Once | ORAL | Status: AC
  Administered 2022-06-27: 25 mg via ORAL
  Filled 2022-06-27: qty 1

## 2022-06-27 MED ORDER — ACETAMINOPHEN 325 MG PO TABS
650.0000 mg | ORAL_TABLET | Freq: Once | ORAL | Status: AC
  Administered 2022-06-27: 650 mg via ORAL
  Filled 2022-06-27: qty 2

## 2022-06-27 NOTE — Progress Notes (Signed)
Diagnosis: Ulcerative Colitis  Provider:  Chilton Greathouse MD  Procedure: IV Infusion  IV Type: Peripheral, IV Location: L forearm  Remicade (Infliximab), Dose: 700mg   Infusion Start Time: 0943  Infusion Stop Time: 1155  Post Infusion IV Care: Peripheral IV Discontinued  Discharge: Condition: Good, Destination: Home . AVS Declined  Performed by:  Adriana Mccallum, RN

## 2022-06-28 ENCOUNTER — Ambulatory Visit (HOSPITAL_COMMUNITY): Payer: Medicare Other | Attending: Cardiovascular Disease

## 2022-06-28 ENCOUNTER — Encounter: Payer: Self-pay | Admitting: Gastroenterology

## 2022-06-28 DIAGNOSIS — I2510 Atherosclerotic heart disease of native coronary artery without angina pectoris: Secondary | ICD-10-CM | POA: Diagnosis not present

## 2022-06-28 DIAGNOSIS — R079 Chest pain, unspecified: Secondary | ICD-10-CM | POA: Diagnosis not present

## 2022-06-28 LAB — MYOCARDIAL PERFUSION IMAGING
Estimated workload: 10.1
Exercise duration (min): 8 min
Exercise duration (sec): 7 s
LV dias vol: 97 mL (ref 62–150)
LV sys vol: 49 mL
MPHR: 150 {beats}/min
Nuc Stress EF: 49 %
Peak HR: 103 {beats}/min
Percent HR: 70 %
RPE: 18
Rest HR: 44 {beats}/min
Rest Nuclear Isotope Dose: 10.5 mCi
SDS: 2
SRS: 0
SSS: 2
ST Depression (mm): 0 mm
Stress Nuclear Isotope Dose: 31.2 mCi
TID: 0.94

## 2022-06-28 MED ORDER — REGADENOSON 0.4 MG/5ML IV SOLN
0.4000 mg | Freq: Once | INTRAVENOUS | Status: AC
Start: 2022-06-28 — End: 2022-06-28
  Administered 2022-06-28: 0.4 mg via INTRAVENOUS

## 2022-06-28 MED ORDER — TECHNETIUM TC 99M TETROFOSMIN IV KIT
31.2000 | PACK | Freq: Once | INTRAVENOUS | Status: AC | PRN
  Administered 2022-06-28: 31.2 via INTRAVENOUS

## 2022-06-28 MED ORDER — TECHNETIUM TC 99M TETROFOSMIN IV KIT
10.5000 | PACK | Freq: Once | INTRAVENOUS | Status: AC | PRN
  Administered 2022-06-28: 10.5 via INTRAVENOUS

## 2022-06-28 NOTE — Telephone Encounter (Signed)
Patient is also sending messages through My Chart. See today's message for details.

## 2022-06-28 NOTE — Telephone Encounter (Signed)
It is not possible to get him in to be seen in August unless he will see an APP. If an appointment opens up, I can move him. Is 10/25/22 okay with you?

## 2022-06-28 NOTE — Telephone Encounter (Signed)
Okay to schedule visit with APP in August.  Thank you

## 2022-06-29 ENCOUNTER — Telehealth (HOSPITAL_BASED_OUTPATIENT_CLINIC_OR_DEPARTMENT_OTHER): Payer: Self-pay | Admitting: *Deleted

## 2022-06-29 NOTE — Telephone Encounter (Signed)
Patient viewed in my chart 

## 2022-06-29 NOTE — Telephone Encounter (Signed)
-----   Message from Alver Sorrow, NP sent at 06/28/2022  2:52 PM EDT ----- Stress test low risk with no evidence of ischemia.  Good result!  Per Dr. Leonides Sake last note as stress test was low risk may discontinue Plavix.  Recommend continuation of aspirin.

## 2022-07-04 ENCOUNTER — Ambulatory Visit (HOSPITAL_BASED_OUTPATIENT_CLINIC_OR_DEPARTMENT_OTHER)
Admission: RE | Admit: 2022-07-04 | Discharge: 2022-07-04 | Disposition: A | Discharge status: Home / Self Care | Payer: Medicare Other | Source: Ambulatory Visit | Attending: Cardiovascular Disease | Admitting: Cardiovascular Disease

## 2022-07-04 DIAGNOSIS — I7121 Aneurysm of the ascending aorta, without rupture: Secondary | ICD-10-CM | POA: Diagnosis not present

## 2022-07-04 DIAGNOSIS — I77810 Thoracic aortic ectasia: Secondary | ICD-10-CM | POA: Diagnosis not present

## 2022-07-04 DIAGNOSIS — J9811 Atelectasis: Secondary | ICD-10-CM | POA: Diagnosis not present

## 2022-07-04 MED ORDER — IOHEXOL 350 MG/ML SOLN
100.0000 mL | Freq: Once | INTRAVENOUS | Status: AC | PRN
  Administered 2022-07-04: 75 mL via INTRAVENOUS

## 2022-07-13 ENCOUNTER — Telehealth (HOSPITAL_BASED_OUTPATIENT_CLINIC_OR_DEPARTMENT_OTHER): Payer: Self-pay | Admitting: *Deleted

## 2022-07-13 DIAGNOSIS — I7121 Aneurysm of the ascending aorta, without rupture: Secondary | ICD-10-CM

## 2022-07-13 NOTE — Telephone Encounter (Signed)
-----   Message from Chilton Si, MD sent at 07/06/2022 11:55 AM EDT ----- Aortic aneurysm is 4.5 cm.  stable in size from prior.  Stable from 2018.  This is reassuring. Recommend getting MRA of the chest in one year to follow the size but it will not require radiation like a CT does.

## 2022-07-13 NOTE — Telephone Encounter (Signed)
Patient viewed with comments in mychart MRA chest ordered

## 2022-07-25 ENCOUNTER — Encounter (HOSPITAL_BASED_OUTPATIENT_CLINIC_OR_DEPARTMENT_OTHER): Payer: Self-pay | Admitting: Cardiovascular Disease

## 2022-07-27 ENCOUNTER — Other Ambulatory Visit (INDEPENDENT_AMBULATORY_CARE_PROVIDER_SITE_OTHER): Payer: Medicare Other

## 2022-07-27 DIAGNOSIS — K5190 Ulcerative colitis, unspecified, without complications: Secondary | ICD-10-CM | POA: Diagnosis not present

## 2022-07-27 LAB — COMPREHENSIVE METABOLIC PANEL
ALT: 14 U/L (ref 0–53)
AST: 23 U/L (ref 0–37)
Albumin: 4 g/dL (ref 3.5–5.2)
Alkaline Phosphatase: 42 U/L (ref 39–117)
BUN: 24 mg/dL — ABNORMAL HIGH (ref 6–23)
CO2: 26 mEq/L (ref 19–32)
Calcium: 9.5 mg/dL (ref 8.4–10.5)
Chloride: 105 mEq/L (ref 96–112)
Creatinine, Ser: 1.04 mg/dL (ref 0.40–1.50)
GFR: 72.85 mL/min (ref >60.00)
Glucose, Bld: 87 mg/dL (ref 70–99)
Potassium: 4.3 mEq/L (ref 3.5–5.1)
Sodium: 138 mEq/L (ref 135–145)
Total Bilirubin: 1 mg/dL (ref 0.2–1.2)
Total Protein: 7.2 g/dL (ref 6.0–8.3)

## 2022-07-27 LAB — CBC WITH DIFFERENTIAL/PLATELET
Basophils Absolute: 0.1 10*3/uL (ref 0.0–0.1)
Basophils Relative: 1.2 % (ref 0.0–3.0)
Eosinophils Absolute: 0.2 10*3/uL (ref 0.0–0.7)
Eosinophils Relative: 3.1 % (ref 0.0–5.0)
HCT: 45.5 % (ref 39.0–52.0)
Hemoglobin: 15.3 g/dL (ref 13.0–17.0)
Lymphocytes Relative: 35.6 % (ref 12.0–46.0)
Lymphs Abs: 2.4 10*3/uL (ref 0.7–4.0)
MCHC: 33.6 g/dL (ref 30.0–36.0)
MCV: 97.6 fl (ref 78.0–100.0)
Monocytes Absolute: 0.7 10*3/uL (ref 0.1–1.0)
Monocytes Relative: 10.1 % (ref 3.0–12.0)
Neutro Abs: 3.4 10*3/uL (ref 1.4–7.7)
Neutrophils Relative %: 50 % (ref 43.0–77.0)
Platelets: 173 10*3/uL (ref 150.0–400.0)
RBC: 4.66 Mil/uL (ref 4.22–5.81)
RDW: 13 % (ref 11.5–15.5)
WBC: 6.9 10*3/uL (ref 4.0–10.5)

## 2022-07-27 LAB — C-REACTIVE PROTEIN: CRP: <1 mg/dL (ref 0.5–20.0)

## 2022-07-28 ENCOUNTER — Ambulatory Visit: Payer: Medicare Other | Admitting: Gastroenterology

## 2022-08-01 ENCOUNTER — Ambulatory Visit: Payer: Medicare Other

## 2022-08-01 DIAGNOSIS — K5190 Ulcerative colitis, unspecified, without complications: Secondary | ICD-10-CM | POA: Diagnosis not present

## 2022-08-03 ENCOUNTER — Other Ambulatory Visit (HOSPITAL_COMMUNITY): Payer: Medicare Other

## 2022-08-06 LAB — CALPROTECTIN, FECAL: Calprotectin, Fecal: 14 ug/g (ref 0–120)

## 2022-08-15 ENCOUNTER — Encounter: Payer: Self-pay | Admitting: Gastroenterology

## 2022-08-17 ENCOUNTER — Encounter (INDEPENDENT_AMBULATORY_CARE_PROVIDER_SITE_OTHER): Payer: Self-pay

## 2022-08-17 DIAGNOSIS — M25552 Pain in left hip: Secondary | ICD-10-CM | POA: Diagnosis not present

## 2022-08-19 NOTE — Telephone Encounter (Signed)
Please advise patient to hold mesalamine for few weeks to see if symptoms improve, please continue to monitor if symptoms worsen after next infusion of Remicade, will need to reevaluate. Agree with following up with Ortho.

## 2022-08-22 ENCOUNTER — Ambulatory Visit: Payer: Medicare Other

## 2022-08-24 ENCOUNTER — Encounter (HOSPITAL_BASED_OUTPATIENT_CLINIC_OR_DEPARTMENT_OTHER): Payer: Self-pay | Admitting: Cardiovascular Disease

## 2022-08-24 DIAGNOSIS — I2510 Atherosclerotic heart disease of native coronary artery without angina pectoris: Secondary | ICD-10-CM

## 2022-08-24 MED ORDER — REPATHA SURECLICK 140 MG/ML ~~LOC~~ SOAJ: 140.0000 mg | SUBCUTANEOUS | 3 refills | Status: DC

## 2022-08-29 NOTE — Telephone Encounter (Signed)
Joseph Booker, is this a prior auth issue or something else? Just figured you were probably the best place to start.

## 2022-08-31 ENCOUNTER — Encounter: Payer: Self-pay | Admitting: Gastroenterology

## 2022-08-31 ENCOUNTER — Telehealth: Payer: Self-pay

## 2022-08-31 ENCOUNTER — Other Ambulatory Visit (HOSPITAL_COMMUNITY): Payer: Self-pay

## 2022-08-31 NOTE — Telephone Encounter (Signed)
PA request has been Submitted. New Encounter created for follow up. For additional info see Pharmacy Prior Auth telephone encounter from 08/31/22

## 2022-08-31 NOTE — Telephone Encounter (Addendum)
Looks like request for Repatha was recently submitted and denied. Sending appeal now.

## 2022-09-02 ENCOUNTER — Other Ambulatory Visit (HOSPITAL_COMMUNITY): Payer: Self-pay

## 2022-09-05 ENCOUNTER — Other Ambulatory Visit (HOSPITAL_COMMUNITY): Payer: Self-pay

## 2022-09-05 NOTE — Progress Notes (Unsigned)
Subjective:    Patient ID: Joseph Booker, male    DOB: 1950-06-02, 72 y.o.   MRN: [id number]     HPI Joseph Booker is here for follow-up of his chronic medical problems.    Tingling in all toes x 8 months.     1 month - left hip pain - thought it was hip OA. Sometimes groin - sometimes lateral aspect.  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  He walks a lot and is walking less.  That may have helped with pain.   Medications and allergies reviewed with patient and updated if appropriate.  Current Outpatient Medications on File Prior to Visit  Medication Sig Dispense Refill   aspirin 81 MG tablet Take 81 mg by mouth daily.     Calcium Carb-Cholecalciferol (CALCIUM+D3) 600-20 MG-MCG TABS Take 1 tablet by mouth daily.     Coenzyme Q10 (CO Q-10) 300 MG CAPS Take 300 mg by mouth every morning.     Evolocumab (REPATHA SURECLICK) 140 MG/ML SOAJ Inject 140 mg into the skin every 14 (fourteen) days. 6 mL 3   latanoprost (XALATAN) 0.005 % ophthalmic solution Place 1 drop into the left eye daily.     Multiple Vitamins-Minerals (MULTIVITAMIN WITH MINERALS) tablet Take 1 tablet by mouth daily.     timolol (TIMOPTIC) 0.5 % ophthalmic solution Place 1 drop into both eyes 2 (two) times daily.      inFLIXimab (REMICADE IV) Inject 700 mg into the vein. AS DIRECTED     mesalamine (LIALDA) 1.2 g EC tablet TAKE 4 TABLETS BY MOUTH ONCE  DAILY WITH BREAKFAST (Patient not taking: Reported on 09/06/2022) 360 tablet 3   mesalamine (ROWASA) 4 g enema Place 60 mLs (4 g total) rectally at bedtime. (Patient not taking: Reported on 09/06/2022) 30 mL 0   No current facility-administered medications on file prior to visit.    Review of Systems  Constitutional:  Negative for fever.  Eyes:  Negative for visual disturbance.  Respiratory:  Positive for shortness of breath (sometimes with walking). Negative for cough and wheezing.   Cardiovascular:  Positive for  palpitations (occ). Negative for chest pain and leg swelling.  Gastrointestinal:  Negative for abdominal pain, blood in stool, constipation and diarrhea.       No gerd  Genitourinary:  Negative for difficulty urinating and dysuria.       Weak stream  Musculoskeletal:  Positive for arthralgias (left hip pain). Negative for back pain.  Skin:  Positive for rash (rosacea).  Neurological:  Positive for numbness (tinglingin toes). Negative for light-headedness and headaches.  Psychiatric/Behavioral:  Negative for dysphoric mood. The patient is not nervous/anxious.        Objective:   Vitals:   09/06/22 0937  BP: 110/70  Pulse: (!) 44  Temp: 98.1 F (36.7 C)  SpO2: 98%   Filed Weights   09/06/22 0937  Weight: 149 lb 9.6 oz (67.9 kg)   Body mass index is 22.75 kg/m.  BP Readings from Last 3 Encounters:  09/06/22 110/70  06/27/22 130/78  06/20/22 (!) 138/92    Wt Readings from Last 3 Encounters:  09/06/22 149 lb 9.6 oz (67.9 kg)  06/28/22 149 lb (67.6 kg)  06/27/22 149 lb (67.6 kg)      Physical Exam Constitutional: He appears well-developed and well-nourished. No distress.  HENT:  Head: Normocephalic and atraumatic.  Right Ear: External  ear normal.  Left Ear: External ear normal.  Normal ear canals and TM b/l  Mouth/Throat: Oropharynx is clear and moist. Eyes: Conjunctivae and EOM are normal.  Neck: Neck supple. No tracheal deviation present. No thyromegaly present.  No carotid bruit  Cardiovascular: Normal rate, regular rhythm, normal heart sounds and intact distal pulses.   No murmur heard.  No lower extremity edema. Pulmonary/Chest: Effort normal and breath sounds normal. No respiratory distress. He has no wheezes. He has no rales.  Abdominal: Soft. He exhibits no distension. There is no tenderness.  Genitourinary: Slightly enlarged prostate without nodules or asymmetry Lymphadenopathy:   He has no cervical adenopathy.  Skin: Skin is warm and dry. He is not  diaphoretic.  Psychiatric: He has a normal mood and affect. His behavior is normal.         Assessment & Plan:    Exercise   walking Weight  normal    Reviewed recommended immunizations.   Health Maintenance  Topic Date Due   Medicare Annual Wellness (AWV)  Never done   DEXA SCAN  08/15/2022   COVID-19 Vaccine (6 - 2023-24 season) 09/22/2022 (Originally 09/17/2021)   INFLUENZA VACCINE  04/17/2023 (Originally 08/18/2022)   Colonoscopy  04/18/2023   DTaP/Tdap/Td (3 - Td or Tdap) 09/11/2031   Pneumonia Vaccine 70+ Years old  Completed   Hepatitis C Screening  Completed   Zoster Vaccines- Shingrix  Completed   HPV VACCINES  Aged Out     See Problem List for Assessment and Plan of chronic medical problems.    I spent 30 minutes dedicated to the care of this patient on the date of this encounter including review of recent labs, imaging and procedures, speciality notes, obtaining history, communicating with the patient, ordering medications, tests, and documenting clinical information in the EHR

## 2022-09-05 NOTE — Patient Instructions (Addendum)
Blood work was ordered.   The lab is on the first floor.    Medications changes include :   none     Return in about 1 year (around 09/06/2023) for follow up, Schedule DEXA-[place].     Health Maintenance, Male Adopting a healthy lifestyle and getting preventive care are important in promoting health and wellness. Ask your health care provider about: The right schedule for you to have regular tests and exams. Things you can do on your own to prevent diseases and keep yourself healthy. What should I know about diet, weight, and exercise? Eat a healthy diet  Eat a diet that includes plenty of vegetables, fruits, low-fat dairy products, and lean protein. Do not eat a lot of foods that are high in solid fats, added sugars, or sodium. Maintain a healthy weight Body mass index (BMI) is a measurement that can be used to identify possible weight problems. It estimates body fat based on height and weight. Your health care provider can help determine your BMI and help you achieve or maintain a healthy weight. Get regular exercise Get regular exercise. This is one of the most important things you can do for your health. Most adults should: Exercise for at least 150 minutes each week. The exercise should increase your heart rate and make you sweat (moderate-intensity exercise). Do strengthening exercises at least twice a week. This is in addition to the moderate-intensity exercise. Spend less time sitting. Even light physical activity can be beneficial. Watch cholesterol and blood lipids Have your blood tested for lipids and cholesterol at 70 years of age, then have this test every 5 years. You may need to have your cholesterol levels checked more often if: Your lipid or cholesterol levels are high. You are older than 70 years of age. You are at high risk for heart disease. What should I know about cancer screening? Many types of cancers can be detected early and may often be  prevented. Depending on your health history and family history, you may need to have cancer screening at various ages. This may include screening for: Colorectal cancer. Prostate cancer. Skin cancer. Lung cancer. What should I know about heart disease, diabetes, and high blood pressure? Blood pressure and heart disease High blood pressure causes heart disease and increases the risk of stroke. This is more likely to develop in people who have high blood pressure readings or are overweight. Talk with your health care provider about your target blood pressure readings. Have your blood pressure checked: Every 3-5 years if you are 42-56 years of age. Every year if you are 81 years old or older. If you are between the ages of 9 and 61 and are a current or former smoker, ask your health care provider if you should have a one-time screening for abdominal aortic aneurysm (AAA). Diabetes Have regular diabetes screenings. This checks your fasting blood sugar level. Have the screening done: Once every three years after age 84 if you are at a normal weight and have a low risk for diabetes. More often and at a younger age if you are overweight or have a high risk for diabetes. What should I know about preventing infection? Hepatitis B If you have a higher risk for hepatitis B, you should be screened for this virus. Talk with your health care provider to find out if you are at risk for hepatitis B infection. Hepatitis C Blood testing is recommended for: Everyone born from [birth year] through 1963.  Anyone with known risk factors for hepatitis C. Sexually transmitted infections (STIs) You should be screened each year for STIs, including gonorrhea and chlamydia, if: You are sexually active and are younger than 70 years of age. You are older than 70 years of age and your health care provider tells you that you are at risk for this type of infection. Your sexual activity has changed since you were last screened,  and you are at increased risk for chlamydia or gonorrhea. Ask your health care provider if you are at risk. Ask your health care provider about whether you are at high risk for HIV. Your health care provider may recommend a prescription medicine to help prevent HIV infection. If you choose to take medicine to prevent HIV, you should first get tested for HIV. You should then be tested every 3 months for as long as you are taking the medicine. Follow these instructions at home: Alcohol use Do not drink alcohol if your health care provider tells you not to drink. If you drink alcohol: Limit how much you have to 0-2 drinks a day. Know how much alcohol is in your drink. In the [place] one drink equals one 12 oz bottle of beer (355 mL), one 5 oz glass of wine (148 mL), or one 1 oz glass of hard liquor (44 mL). Lifestyle Do not use any products that contain nicotine or tobacco. These products include cigarettes, chewing tobacco, and vaping devices, such as e-cigarettes. If you need help quitting, ask your health care provider. Do not use street drugs. Do not share needles. Ask your health care provider for help if you need support or information about quitting drugs. General instructions Schedule regular health, dental, and eye exams. Stay current with your vaccines. Tell your health care provider if: You often feel depressed. You have ever been abused or do not feel safe at home. Summary Adopting a healthy lifestyle and getting preventive care are important in promoting health and wellness. Follow your health care provider's instructions about healthy diet, exercising, and getting tested or screened for diseases. Follow your health care provider's instructions on monitoring your cholesterol and blood pressure. This information is not intended to replace advice given to you by your health care provider. Make sure you discuss any questions you have with your health care provider. Document Revised:  05/25/2020 Document Reviewed: 05/25/2020 Elsevier Patient Education  2024 [employer].

## 2022-09-06 ENCOUNTER — Encounter: Payer: Self-pay | Admitting: Internal Medicine

## 2022-09-06 ENCOUNTER — Ambulatory Visit (INDEPENDENT_AMBULATORY_CARE_PROVIDER_SITE_OTHER): Payer: Medicare Other | Admitting: Internal Medicine

## 2022-09-06 ENCOUNTER — Other Ambulatory Visit (HOSPITAL_COMMUNITY): Payer: Self-pay

## 2022-09-06 VITALS — BP 110/70 | HR 44 | Temp 98.1°F | Ht 68.0 in | Wt 149.6 lb

## 2022-09-06 DIAGNOSIS — I2510 Atherosclerotic heart disease of native coronary artery without angina pectoris: Secondary | ICD-10-CM

## 2022-09-06 DIAGNOSIS — E559 Vitamin D deficiency, unspecified: Secondary | ICD-10-CM | POA: Diagnosis not present

## 2022-09-06 DIAGNOSIS — R202 Paresthesia of skin: Secondary | ICD-10-CM

## 2022-09-06 DIAGNOSIS — M810 Age-related osteoporosis without current pathological fracture: Secondary | ICD-10-CM | POA: Diagnosis not present

## 2022-09-06 DIAGNOSIS — K51211 Ulcerative (chronic) proctitis with rectal bleeding: Secondary | ICD-10-CM | POA: Diagnosis not present

## 2022-09-06 DIAGNOSIS — Z125 Encounter for screening for malignant neoplasm of prostate: Secondary | ICD-10-CM | POA: Diagnosis not present

## 2022-09-06 DIAGNOSIS — R739 Hyperglycemia, unspecified: Secondary | ICD-10-CM | POA: Diagnosis not present

## 2022-09-06 DIAGNOSIS — N401 Enlarged prostate with lower urinary tract symptoms: Secondary | ICD-10-CM | POA: Diagnosis not present

## 2022-09-06 DIAGNOSIS — R200 Anesthesia of skin: Secondary | ICD-10-CM | POA: Insufficient documentation

## 2022-09-06 DIAGNOSIS — R3912 Poor urinary stream: Secondary | ICD-10-CM | POA: Diagnosis not present

## 2022-09-06 DIAGNOSIS — M25552 Pain in left hip: Secondary | ICD-10-CM | POA: Diagnosis not present

## 2022-09-06 DIAGNOSIS — E7800 Pure hypercholesterolemia, unspecified: Secondary | ICD-10-CM | POA: Diagnosis not present

## 2022-09-06 DIAGNOSIS — Z0000 Encounter for general adult medical examination without abnormal findings: Secondary | ICD-10-CM

## 2022-09-06 LAB — COMPREHENSIVE METABOLIC PANEL
ALT: 14 U/L (ref 0–53)
AST: 22 U/L (ref 0–37)
Albumin: 4.2 g/dL (ref 3.5–5.2)
Alkaline Phosphatase: 49 U/L (ref 39–117)
BUN: 26 mg/dL — ABNORMAL HIGH (ref 6–23)
CO2: 28 mEq/L (ref 19–32)
Calcium: 9.3 mg/dL (ref 8.4–10.5)
Chloride: 106 mEq/L (ref 96–112)
Creatinine, Ser: 1.08 mg/dL (ref 0.40–1.50)
GFR: 69.57 mL/min (ref >60.00)
Glucose, Bld: 97 mg/dL (ref 70–99)
Potassium: 4.5 mEq/L (ref 3.5–5.1)
Sodium: 143 mEq/L (ref 135–145)
Total Bilirubin: 1.2 mg/dL (ref 0.2–1.2)
Total Protein: 7.5 g/dL (ref 6.0–8.3)

## 2022-09-06 LAB — LIPID PANEL
Cholesterol: 122 mg/dL (ref 0–200)
HDL: 46 mg/dL (ref >39.00)
LDL Cholesterol: 54 mg/dL (ref 0–99)
NonHDL: 76.06
Total CHOL/HDL Ratio: 3
Triglycerides: 109 mg/dL (ref 0.0–149.0)
VLDL: 21.8 mg/dL (ref 0.0–40.0)

## 2022-09-06 LAB — PSA, MEDICARE: PSA: 4.7 ng/ml — ABNORMAL HIGH (ref 0.10–4.00)

## 2022-09-06 LAB — TSH: TSH: 1.09 u[IU]/mL (ref 0.35–5.50)

## 2022-09-06 LAB — VITAMIN B12: Vitamin B-12: 569 pg/mL (ref 211–911)

## 2022-09-06 LAB — VITAMIN D 25 HYDROXY (VIT D DEFICIENCY, FRACTURES): VITD: 47.13 ng/mL (ref 30.00–100.00)

## 2022-09-06 LAB — HEMOGLOBIN A1C: Hgb A1c MFr Bld: 5.4 % (ref 4.6–6.5)

## 2022-09-06 NOTE — Assessment & Plan Note (Signed)
New Started about 8 months ago No tingling in hands Check B12 level, A1c Depending on results may need to see neurology to have another EMG

## 2022-09-06 NOTE — Assessment & Plan Note (Signed)
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

## 2022-09-06 NOTE — Assessment & Plan Note (Addendum)
Chronic Currently not taking any medication-was concerned one of the medications was causing his left hip pain Currently asymptomatic Management per GI

## 2022-09-06 NOTE — Assessment & Plan Note (Signed)
Chronic Following with cardiology-Dr. Duke Salvia S/P LAD PCI No symptoms consistent with angina On Plavix, Repatha

## 2022-09-06 NOTE — Assessment & Plan Note (Addendum)
Chronic Has been on a lot of prednisone in the past Last DEXA-07/2020 showed osteoporosis DEXA ordered Stressed regular exercise Continue calcium and vitamin D intake Check vitamin D level Briefly discussed Fosamax, Reclast and Prolia

## 2022-09-06 NOTE — Assessment & Plan Note (Signed)
Chronic Sugar slightly elevated at times a1c today

## 2022-09-06 NOTE — Assessment & Plan Note (Addendum)
Chronic Weak stream but empties bladder Following with urology - Dr [name]

## 2022-09-06 NOTE — Assessment & Plan Note (Signed)
Chronic Regular exercise and healthy diet encouraged Check lipid panel  Continue Repatha 140 mg q. 14 days

## 2022-09-06 NOTE — Assessment & Plan Note (Signed)
Chronic Taking vitamin D daily Check vitamin D level  

## 2022-09-08 ENCOUNTER — Other Ambulatory Visit (HOSPITAL_COMMUNITY): Payer: Self-pay

## 2022-09-08 NOTE — Telephone Encounter (Signed)
Per test claim appeal was approved. Refill too soon. Pharmacy has filled drug.

## 2022-09-09 ENCOUNTER — Encounter: Payer: Self-pay | Admitting: Internal Medicine

## 2022-09-09 NOTE — Telephone Encounter (Signed)
No orders for bone density.Marland KitchenRaechel Booker

## 2022-09-23 ENCOUNTER — Ambulatory Visit (INDEPENDENT_AMBULATORY_CARE_PROVIDER_SITE_OTHER)
Admission: RE | Admit: 2022-09-23 | Discharge: 2022-09-23 | Disposition: A | Discharge status: Home / Self Care | Payer: Medicare Other | Source: Ambulatory Visit | Attending: Internal Medicine | Admitting: Internal Medicine

## 2022-09-23 DIAGNOSIS — E559 Vitamin D deficiency, unspecified: Secondary | ICD-10-CM | POA: Diagnosis not present

## 2022-09-23 DIAGNOSIS — M810 Age-related osteoporosis without current pathological fracture: Secondary | ICD-10-CM

## 2022-09-30 ENCOUNTER — Ambulatory Visit (INDEPENDENT_AMBULATORY_CARE_PROVIDER_SITE_OTHER): Payer: Medicare Other

## 2022-09-30 VITALS — BP 136/82 | HR 49 | Ht 68.0 in | Wt 150.4 lb

## 2022-09-30 DIAGNOSIS — Z0000 Encounter for general adult medical examination without abnormal findings: Secondary | ICD-10-CM | POA: Diagnosis not present

## 2022-09-30 NOTE — Progress Notes (Signed)
Subjective:   Joseph Booker is a 72 y.o. male who presents for an Initial Medicare Annual Wellness Visit.  Visit Complete: In person  Patient Medicare AWV questionnaire was completed by the patient on 09/27/22; I have confirmed that all information answered by patient is correct and no changes since this date.  Cardiac Risk Factors include: advanced age (>10men, >7 women);male gender;dyslipidemia;Other (see comment), Risk factor comments: CAD, Mitral regurgitation, Ascending Aortic aneurysm, Osteoporosis     Objective:    Today's Vitals   09/30/22 1419  BP: 136/82  Pulse: (!) 49  SpO2: 100%  Weight: 150 lb 6.4 oz (68.2 kg)  Height: 5\' 8"  (1.727 m)   Body mass index is 22.87 kg/m.     09/30/2022    2:33 PM 09/21/2021   11:04 PM 09/11/2020    7:35 AM 11/13/2017    9:31 PM 07/03/2017   12:58 PM 07/31/2014    2:14 PM 07/03/2013    2:40 PM  Advanced Directives  Does Patient Have a Medical Advance Directive? Yes No Yes No Yes Yes Patient has advance directive, copy not in chart  Type of Advance Directive Healthcare Power of [place];Living will  Healthcare Power of [place];Living will  Living will;Healthcare Power of [employer] Power of [place];Living will  Does patient want to make changes to medical advance directive?   No - Patient declined      Copy of Healthcare Power of Attorney in Chart? No - copy requested  No - copy requested  No - copy requested    Would patient like information on creating a medical advance directive?    No - Patient declined       Current Medications (verified) Outpatient Encounter Medications as of 09/30/2022  Medication Sig   aspirin 81 MG tablet Take 81 mg by mouth daily.   Calcium Carb-Cholecalciferol (CALCIUM+D3) 600-20 MG-MCG TABS Take 1 tablet by mouth daily.   Coenzyme Q10 (CO Q-10) 300 MG CAPS Take 300 mg by mouth every morning.   Evolocumab (REPATHA SURECLICK) 140 MG/ML SOAJ Inject 140 mg into the skin every 14 (fourteen) days.    latanoprost (XALATAN) 0.005 % ophthalmic solution Place 1 drop into the left eye daily.   mesalamine (LIALDA) 1.2 g EC tablet TAKE 4 TABLETS BY MOUTH ONCE  DAILY WITH BREAKFAST   Multiple Vitamins-Minerals (MULTIVITAMIN WITH MINERALS) tablet Take 1 tablet by mouth daily.   timolol (TIMOPTIC) 0.5 % ophthalmic solution Place 1 drop into both eyes 2 (two) times daily.    inFLIXimab (REMICADE IV) Inject 700 mg into the vein. AS DIRECTED   mesalamine (ROWASA) 4 g enema Place 60 mLs (4 g total) rectally at bedtime. (Patient not taking: Reported on 09/06/2022)   No facility-administered encounter medications on file as of 09/30/2022.    Allergies (verified) Lipitor [atorvastatin]   History: Past Medical History:  Diagnosis Date   Allergic rhinitis    Allergy    Anxiety state, unspecified    Ascending aortic aneurysm (HCC) 05/15/2020   Bradycardia 05/03/2021   CAD (coronary artery disease)    [name] / nuclear 2005, excellent tolerance no scar or ischemia, EF 51% / nuclear, February, 2012, no scar or ischemia / catheterization March 26, 2010.. 10% in-stent restenosis, minimal other nonobstructive coronary disease, ejection fraction 55%., excellent result; ETT 9/13: ex 13:14, no CP, no ischemic ECG changes   Cancer (HCC)    basal cell carcinoma x2 on face   Cataract    surgery to both eyes  Elevated blood pressure reading 05/15/2020   Glaucoma, left eye    left eye   History of exercise intolerance 05/23/2013   cardiopulmonary exercise test-- normal functional capacity when compared to matched sedentary norms, borderline ventilatory limitation with the patient's exercise tidal volume reaching 90% of the measured inspiratory capacity ( pt reports no dyspnea), did not appear to be circulatory limitation, normal CPX test   Hyperlipidemia    Nocturia    Osteopenia    Pure hypercholesterolemia 06/12/2009   Thoracic ascending aortic aneurysm [facility])    followed by dr Chilton Si---   last CT 12-29-2016 ,  4.5cm   Ulcerative colitis, universal (HCC)    GI-- dr Lavon Paganini--  dx 1990s  (per note mild Mayo Score 1)   Wears glasses    Past Surgical History:  Procedure Laterality Date   CARDIAC CATHETERIZATION  03/26/2010    dr Milton Ferguson   single vessel CAD w/ patent mLAD stent w/ 10% in-stent restenosis w/ mild to moderate disease in the remainder LAD w/ no lesions that appearedto be flow-limitinf/  normal LVFSF, ef 55%   CARDIOVASCULAR STRESS TEST  06-30-2016   dr Eden Emms   Low risk nuclear study w/ no evidence ishcemia/  normal LV function and wall motion,  nuclear stress ef 55%   COLONOSCOPY  last one 10-27-2016   CORONARY ANGIOPLASTY WITH STENT PLACEMENT  07-25-2002   dr Amil Amen   PTCA w/  DES x1 to mLAD   INGUINAL HERNIA REPAIR Left 2006   INGUINAL HERNIA REPAIR Right 07/03/2017   Procedure: OPEN REPAIR OF RIGHT INGUINAL HERNIA WITH MESH;  Surgeon: Kinsinger, De Blanch, MD;  Location: [facility] [place];  Service: General;  Laterality: Right;   INSERTION OF MESH Right 07/03/2017   Procedure: INSERTION OF MESH;  Surgeon: Sheliah Hatch De Blanch, MD;  Location: [facility];  Service: General;  Laterality: Right;   LEFT HEART CATH AND CORONARY ANGIOGRAPHY N/A 09/11/2020   Procedure: LEFT HEART CATH AND CORONARY ANGIOGRAPHY;  Surgeon: Corky Crafts, MD;  Location: [facility] INVASIVE CV LAB;  Service: Cardiovascular;  Laterality: N/A;   SEPTOPLASTY  1987   w/ RHINOPLASTY   THORACIC AORTOGRAM N/A 09/11/2020   Procedure: THORACIC AORTOGRAM;  Surgeon: Corky Crafts, MD;  Location: [facility] INVASIVE CV LAB;  Service: Cardiovascular;  Laterality: N/A;   TONSILLECTOMY  1959   TRANSTHORACIC ECHOCARDIOGRAM  06-30-2016     dr Duane Boston 55-60%/  trivial AR/ mild dilated ascending aorta, 75mm/  trivial MR/  mild PR and TR    Family History  Problem Relation Age of Onset   Diabetes Mother    Heart disease Mother    Lung disease Mother        ILD   Rheum arthritis  Mother    CAD Mother    Heart disease Father 31       MI   Heart attack Father    Asthma Sister    Colon cancer Neg Hx    Esophageal cancer Neg Hx    Pancreatic cancer Neg Hx    Kidney disease Neg Hx    Liver disease Neg Hx    Rectal cancer Neg Hx    Stomach cancer Neg Hx    Social History   Socioeconomic History   Marital status: Married    Spouse name: Tammi   Number of children: 1   Years of education: Not on file   Highest education level: Not on file  Occupational History  Occupation: retired    Associate Professor: RETIRED  Tobacco Use   Smoking status: Never   Smokeless tobacco: Never   Tobacco comments:    Married, 1 stepson, 5 g-kids. Retired Buyer, retail, now Clinical cytogeneticist status: Never Used  Substance and Sexual Activity   Alcohol use: Yes    Comment: occasional wine   Drug use: No   Sexual activity: Yes    Partners: Female  Other Topics Concern   Not on file  Social History Narrative   Exercising regularly: walks at least 5 miles a day, 3-4 days a week.   Social Determinants of Health   Financial Resource Strain: Low Risk  (09/27/2022)   Overall Financial Resource Strain (CARDIA)    Difficulty of Paying Living Expenses: Not hard at all  Food Insecurity: No Food Insecurity (09/27/2022)   Hunger Vital Sign    Worried About Running Out of Food in the Last Year: Never true    Ran Out of Food in the Last Year: Never true  Transportation Needs: No Transportation Needs (09/27/2022)   PRAPARE - Administrator, Civil Service (Medical): No    Lack of Transportation (Non-Medical): No  Physical Activity: Sufficiently Active (09/27/2022)   Exercise Vital Sign    Days of Exercise per Week: 4 days    Minutes of Exercise per Session: 60 min  Stress: No Stress Concern Present (09/27/2022)   [employer] of Occupational Health - Occupational Stress Questionnaire    Feeling of Stress : Only a little  Social Connections: Unknown (09/27/2022)    Social Connection and Isolation Panel [NHANES]    Frequency of Communication with Friends and Family: Twice a week    Frequency of Social Gatherings with Friends and Family: Once a week    Attends Religious Services: Not on Marketing executive or Organizations: No    Attends Banker Meetings: Never    Marital Status: Married    Tobacco Counseling Counseling given: Not Answered Tobacco comments: Married, 1 stepson, 5 g-kids. Retired Buyer, retail, now Psychiatric nurse Intake:  Pre-visit preparation completed: Yes  Pain : 0-10 Pain Type: Acute pain Pain Location: Hip Pain Orientation: Left Pain Onset: More than a month ago Pain Frequency: Intermittent Effect of Pain on Daily Activities: when he gets out the car     BMI - recorded: 22.87 Nutritional Status: BMI of 19-24  Normal Nutritional Risks: None Diabetes: No  How often do you need to have someone help you when you read instructions, pamphlets, or other written materials from your doctor or pharmacy?: 1 - Never  Interpreter Needed?: No  Information entered by :: Darsha Zumstein, RMA   Activities of Daily Living    09/27/2022    2:34 PM  In your present state of health, do you have any difficulty performing the following activities:  Hearing? 0  Vision? 0  Difficulty concentrating or making decisions? 0  Walking or climbing stairs? 0  Dressing or bathing? 0  Doing errands, shopping? 0  Preparing Food and eating ? N  Using the Toilet? N  In the past six months, have you accidently leaked urine? N  Do you have problems with loss of bowel control? N  Managing your Medications? N  Managing your Finances? N  Housekeeping or managing your Housekeeping? N    Patient Care Team: Pincus Sanes, MD as PCP - General (Internal Medicine) Chilton Si, MD  as PCP - Cardiology (Cardiology) Louis Meckel, MD (Inactive) as Consulting Physician (Gastroenterology) Luis Abed, MD  as Consulting Physician (Cardiology)  Indicate any recent Medical Services you may have received from other than [facility] providers in the past year (date may be approximate).     Assessment:   This is a routine wellness examination for C[place].  Hearing/Vision screen Hearing Screening - Comments:: Sometimes.  Lost some over the years Vision Screening - Comments:: Wears eyeglasses   Goals Addressed               This Visit's Progress     Patient Stated (pt-stated)        For his knee and hip to feel better.      Depression Screen    09/30/2022    2:35 PM 09/06/2022    9:44 AM 08/23/2021    8:40 AM 02/01/2017    2:18 PM  PHQ 2/9 Scores  PHQ - 2 Score 0 0 3 0  PHQ- 9 Score 2 4 10      Fall Risk    09/27/2022    2:34 PM 09/06/2022    9:43 AM 12/12/2018    5:41 PM 08/20/2018   11:45 AM 02/01/2017    2:18 PM  Fall Risk   Falls in the past year? 0 0 0 -- No  Comment   Emmi Telephone Survey: data to providers prior to load [employer] Survey: data to providers prior to load   Number falls in past yr: 0 0  --   Comment    Emmi Telephone Survey Actual Response =    Injury with Fall? 0 0     Risk for fall due to : No Fall Risks No Fall Risks     Follow up Falls evaluation completed;Falls prevention discussed Falls evaluation completed       MEDICARE RISK AT HOME: Medicare Risk at Home Any stairs in or around the home?: Yes If so, are there any without handrails?: Yes Home free of loose throw rugs in walkways, pet beds, electrical cords, etc?: Yes Adequate lighting in your home to reduce risk of falls?: Yes Life alert?: No Use of a cane, walker or w/c?: No Grab bars in the bathroom?: Yes Shower chair or bench in shower?: No Elevated toilet seat or a handicapped toilet?: No  TIMED UP AND GO:  Was the test performed? Yes  Length of time to ambulate 10 feet: 15 sec Gait slow and steady without use of assistive device    Cognitive Function:        09/30/2022    2:33 PM   6CIT Screen  What Year? 0 points  What month? 0 points  What time? 0 points  Count back from 20 0 points  Months in reverse 0 points  Repeat phrase 0 points  Total Score 0 points    Immunizations Immunization History  Administered Date(s) Administered   Fluad Quad(high Dose 65+) 10/07/2021   Influenza Inj Mdck Quad Pf 11/01/2016   Influenza Split 10/18/2010, 10/17/2013   Influenza Whole 10/17/2008   Influenza, High Dose Seasonal PF 11/03/2017, 10/02/2018, 10/30/2020   Influenza, Seasonal, Injecte, Preservative Fre 01/04/2012   Influenza,inj,Quad PF,6+ Mos 11/21/2012, 12/04/2014   Influenza-Unspecified 11/18/2015, 11/17/2016, 11/03/2017   OPV 06/14/1996   PFIZER Comirnaty(Gray Top)Covid-19 Tri-Sucrose Vaccine 03/27/2020   PFIZER(Purple Top)SARS-COV-2 Vaccination 02/25/2019, 03/20/2019, 10/04/2019   PNEUMOCOCCAL CONJUGATE-20 09/03/2021   Pfizer Covid-19 Vaccine Bivalent Booster 17yrs & up 10/08/2020   Pneumococcal Polysaccharide-23 01/18/1992  Td 01/18/2007   Tdap 09/10/2021   Zoster Recombinant(Shingrix) 11/12/2016, 01/16/2017    TDAP status: Up to date  Flu Vaccine status: Due, Education has been provided regarding the importance of this vaccine. Advised may receive this vaccine at local pharmacy or Health Dept. Aware to provide a copy of the vaccination record if obtained from local pharmacy or Health Dept. Verbalized acceptance and understanding.  Pneumococcal vaccine status: Up to date  Covid-19 vaccine status: Information provided on how to obtain vaccines.   Qualifies for Shingles Vaccine? Yes   Zostavax completed Yes   Shingrix Completed?: Yes  Screening Tests Health Maintenance  Topic Date Due   COVID-19 Vaccine (6 - 2023-24 season) 09/18/2022   INFLUENZA VACCINE  04/17/2023 (Originally 08/18/2022)   Colonoscopy  04/18/2023   Medicare Annual Wellness (AWV)  09/30/2023   DEXA SCAN  09/22/2024   DTaP/Tdap/Td (3 - Td or Tdap) 09/11/2031   Pneumonia Vaccine  82+ Years old  Completed   Hepatitis C Screening  Completed   Zoster Vaccines- Shingrix  Completed   HPV VACCINES  Aged Out    Health Maintenance  Health Maintenance Due  Topic Date Due   COVID-19 Vaccine (6 - 2023-24 season) 09/18/2022    Colorectal cancer screening: Type of screening: Colonoscopy. Completed 04/17/2020. Repeat every 3 years  Lung Cancer Screening: (Low Dose CT Chest recommended if Age 29-80 years, 20 pack-year currently smoking OR have quit w/in 15years.) does not qualify.   Lung Cancer Screening Referral: N/A  Additional Screening:  Hepatitis C Screening: does qualify; Completed 01/01/2015  Vision Screening: Recommended annual ophthalmology exams for early detection of glaucoma and other disorders of the eye. Is the patient up to date with their annual eye exam?  Yes  Who is the provider or what is the name of the office in which the patient attends annual eye exams? Dr. Charlotte Sanes If pt is not established with a provider, would they like to be referred to a provider to establish care? No .   Dental Screening: Recommended annual dental exams for proper oral hygiene   Community Resource Referral / Chronic Care Management: CRR required this visit?  No   CCM required this visit?  No    Plan:     I have personally reviewed and noted the following in the patient's chart:   Medical and social history Use of alcohol, tobacco or illicit drugs  Current medications and supplements including opioid prescriptions. Patient is not currently taking opioid prescriptions. Functional ability and status Nutritional status Physical activity Advanced directives List of other physicians Hospitalizations, surgeries, and ER visits in previous 12 months Vitals Screenings to include cognitive, depression, and falls Referrals and appointments  In addition, I have reviewed and discussed with patient certain preventive protocols, quality metrics, and best practice  recommendations. A written personalized care plan for preventive services as well as general preventive health recommendations were provided to patient.     Ameirah Khatoon L Carlinda Ohlson, CMA   09/30/2022   After Visit Summary: (MyChart) Due to this being a telephonic visit, the after visit summary with patients personalized plan was offered to patient via MyChart   Nurse Notes: Patient is due for a Flu vaccine, which he will get soon.  He is up to date on all health maintenance.  Patient will call to schedule next year's AWV.  Patient had no other concerns to address today.

## 2022-09-30 NOTE — Patient Instructions (Signed)
Joseph Booker , Thank you for taking time to come for your Medicare Wellness Visit. I appreciate your ongoing commitment to your health goals. Please review the following plan we discussed and let me know if I can assist you in the future.   Referrals/Orders/Follow-Ups/Clinician Recommendations: You are up to date on your health maintenance.  Keep up the good work.    This is a list of the screening recommended for you and due dates:  Health Maintenance  Topic Date Due   COVID-19 Vaccine (6 - 2023-24 season) 09/18/2022   Flu Shot  04/17/2023*   Colon Cancer Screening  04/18/2023   Medicare Annual Wellness Visit  09/30/2023   DEXA scan (bone density measurement)  09/22/2024   DTaP/Tdap/Td vaccine (3 - Td or Tdap) 09/11/2031   Pneumonia Vaccine  Completed   Hepatitis C Screening  Completed   Zoster (Shingles) Vaccine  Completed   HPV Vaccine  Aged Out  *Topic was postponed. The date shown is not the original due date.    Advanced directives: (Copy Requested) Please bring a copy of your health care power of attorney and living will to the office to be added to your chart at your convenience.  Next Medicare Annual Wellness Visit scheduled for next year: No

## 2022-10-14 DIAGNOSIS — Z23 Encounter for immunization: Secondary | ICD-10-CM | POA: Diagnosis not present

## 2022-10-25 ENCOUNTER — Ambulatory Visit: Payer: Medicare Other | Admitting: Gastroenterology

## 2022-11-16 DIAGNOSIS — N401 Enlarged prostate with lower urinary tract symptoms: Secondary | ICD-10-CM | POA: Diagnosis not present

## 2022-11-16 DIAGNOSIS — N210 Calculus in bladder: Secondary | ICD-10-CM | POA: Diagnosis not present

## 2022-11-16 DIAGNOSIS — R3912 Poor urinary stream: Secondary | ICD-10-CM | POA: Diagnosis not present

## 2022-11-16 DIAGNOSIS — R972 Elevated prostate specific antigen [PSA]: Secondary | ICD-10-CM | POA: Diagnosis not present

## 2022-11-16 DIAGNOSIS — N281 Cyst of kidney, acquired: Secondary | ICD-10-CM | POA: Diagnosis not present

## 2022-12-26 ENCOUNTER — Encounter (HOSPITAL_BASED_OUTPATIENT_CLINIC_OR_DEPARTMENT_OTHER): Payer: Self-pay | Admitting: Cardiovascular Disease

## 2022-12-26 ENCOUNTER — Ambulatory Visit (INDEPENDENT_AMBULATORY_CARE_PROVIDER_SITE_OTHER): Payer: Medicare Other | Admitting: Cardiovascular Disease

## 2022-12-26 ENCOUNTER — Telehealth: Payer: Self-pay | Admitting: Cardiovascular Disease

## 2022-12-26 VITALS — BP 142/78 | HR 45 | Ht 68.0 in | Wt 156.2 lb

## 2022-12-26 DIAGNOSIS — I7121 Aneurysm of the ascending aorta, without rupture: Secondary | ICD-10-CM

## 2022-12-26 DIAGNOSIS — Z01812 Encounter for preprocedural laboratory examination: Secondary | ICD-10-CM | POA: Diagnosis not present

## 2022-12-26 DIAGNOSIS — I340 Nonrheumatic mitral (valve) insufficiency: Secondary | ICD-10-CM | POA: Diagnosis not present

## 2022-12-26 DIAGNOSIS — E7800 Pure hypercholesterolemia, unspecified: Secondary | ICD-10-CM | POA: Diagnosis not present

## 2022-12-26 DIAGNOSIS — I2510 Atherosclerotic heart disease of native coronary artery without angina pectoris: Secondary | ICD-10-CM | POA: Diagnosis not present

## 2022-12-26 DIAGNOSIS — I77810 Thoracic aortic ectasia: Secondary | ICD-10-CM

## 2022-12-26 MED ORDER — REPATHA SURECLICK 140 MG/ML ~~LOC~~ SOAJ: 140.0000 mg | SUBCUTANEOUS | 3 refills | Status: DC

## 2022-12-26 NOTE — Telephone Encounter (Signed)
Patient has been rescheduled for 12/19 arrive at 8:30 for 10:30 with Dr [place] He is aware of new date and time

## 2022-12-26 NOTE — Telephone Encounter (Signed)
Patient is calling to get his procedure move from the 18th to the 19th. Please advise

## 2022-12-26 NOTE — Progress Notes (Signed)
Cardiology Office Note:  .   Date:  12/26/2022  ID:  Joseph Booker, DOB 1950-03-28, MRN [id number] PCP: Pincus Sanes, MD  [facility] Providers Cardiologist:  Chilton Si, MD    History of Present Illness: .   Joseph Booker is a 72 y.o. male with CAD, hyperlipidemia, bradycardia, and mild ascending aorta aneurysm here for follow up.  Joseph Booker had an LAD PCI in 2004. He had a [employer] 06/2016 that revealed LVEF 55% and no ischemia.  He last saw Dr. Eden Emms 02/15/17 at which time he reported some non-exertional shortness of breath.  This has been an ongoing complaint for years.  He had a CPX in 2015 that was normal.  He saw an allergist who suggested that he might have inflammation in his lungs.  His cholesterol was not well controlled on simvastatin so this was switched to atorvastatin.  However his LDL remained above 70 at 40 mg.  It was increased to 80 mg but he developed transaminitis.  He started on Repatha and tolerated it well.  His liver enzymes have been stable.   He had a repeat CT 04/29/20 that showed a stable ascending aortic aneurysm at 4.5 cm. He reported exertional dyspnea. He had a nuclear stress test 06/2020 that showed LVEF 48% and frequent PVC's. Echo 07/2020 showed LVEF 60-65%. Ascending aorta was 4.3 cm. He was seen in the office 08/2020 with chest pain. He underwent left heart cath 08/2020 which showed a patent LAD stent and otherwise mild disease.  He saw [name], NP 12/2021 was doing well.  Echo at that time revealed LVEF 55-60%.  RV function was low normal.  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  Slightly increased from echo 07/2020.  At his visit 06/2022 he reported intermittent chest discomfort and excessive sweating while walking.  He had a nuclear stress test 06/2022 that revealed LVEF 49% with no ischemia.  Chest CT showed that his ascending aorta was 4.5  cm and he had aortic atherosclerosis.  His blood pressure was mildly elevated in the office but controlled at home so no changes were made.  Joseph Booker presents with concerns of increasing fatigue and dyspnea on exertion. He reports a decline in exercise tolerance over the past few months, particularly while walking, a daily activity he had previously performed without difficulty. He describes feeling "out of breath more than I should be" and needing to nap in the afternoon due to excessive tiredness.  He also reports a recent episode of severe arthritis, which he attributes to a change in his Remicade dosage. The arthritis was severe enough to cause significant pain and difficulty getting out of his car, leading to a temporary cessation of his daily walks. The hip pain has since mostly resolved, but knee pain persists.  In addition to these symptoms, he occasionally experiences a discomfort in the chest, which he describes as "not quite right." Despite a relatively normal nuclear stress test conducted less than six months ago, the patient expresses a desire for further cardiac evaluation via catheterization due to these ongoing symptoms.  Joseph Booker is currently on a regimen of Repatha, aspirin, CoQ10, and timolol eye drops. He has been compliant with his medications and has not started any new ones recently. The patient's LDL cholesterol level was last checked in August and was found to be well-controlled.  He has chronic bradycardia  and denies any recent change in his heart rate.  He denies lightheadedness or dizziness.  He reports taking timolol eye drops for years and hasn't noted a change in his heart rate.      ROS:  As per HPI  Studies Reviewed: Marland Kitchen   EKG Interpretation Date/Time:  Monday December 26 2022 08:56:50 EST Ventricular Rate:  45 PR Interval:  158 QRS Duration:  90 QT Interval:  430 QTC Calculation: 371 R Axis:   147  Text Interpretation: Sinus bradycardia Right axis deviation  When compared with ECG of 03-Jul-2017 12:45, QRS axis Shifted right Confirmed by Chilton Si ([id number]) on 12/26/2022 9:21:41 AM   Joseph Booker Myoview 06/2022:   The study is normal. The study is low risk.   No ST deviation was noted.   LV perfusion is normal. There is no evidence of ischemia. There is no evidence of infarction.   Left ventricular function is abnormal. Global function is mildly reduced. There were no regional wall motion abnormalities. Nuclear stress EF: 49 %. The left ventricular ejection fraction is mildly decreased (45-54%). End diastolic cavity size is normal. End systolic cavity size is normal.   Prior study available for comparison from 06/19/2020.   Normal perfusion No ischemia/infarct Estimated EF 49% no RWMAls Note patients prior myovue done 06/19/20 was also normal perfusion with estimated EF 48% but f/u echo showed normal EF    LHC 08/2020:   Prox LAD lesion is 40% stenosed seen in cranial views only.  Eccentric lesion.   Prox RCA lesion is 25% stenosed.   Mid LAD-2 lesion is 30% stenosed.   Previously placed Mid LAD-1 stent (unknown type) is  widely patent.   LV end diastolic pressure is normal.   There is no aortic valve stenosis.   Dilated aortic root.   Patent stent.  Mild to moderate scattered nonobstructive CAD.   Dilated aortic root to be followed with serial imaging.   Risk Assessment/Calculations:         Physical Exam:   VS:  BP (!) 142/78   Pulse (!) 45   Ht 5\' 8"  (1.727 m)   Wt 156 lb 3.2 oz (70.9 kg)   BMI 23.75 kg/m  , BMI Body mass index is 23.75 kg/m. GENERAL:  Well appearing HEENT: Pupils equal round and reactive, fundi not visualized, oral mucosa unremarkable NECK:  No jugular venous distention, waveform within normal limits, carotid upstroke brisk and symmetric, no bruits, no thyromegaly LUNGS:  Clear to auscultation bilaterally HEART:  RRR.  PMI not displaced or sustained,S1 and S2 within normal limits, no S3, no S4, no clicks, no rubs,  no murmurs ABD:  Flat, positive bowel sounds normal in frequency in pitch, no bruits, no rebound, no guarding, no midline pulsatile mass, no hepatomegaly, no splenomegaly EXT:  2 plus pulses throughout, no edema, no cyanosis no clubbing SKIN:  No rashes no nodules NEURO:  Cranial nerves II through XII grossly intact, motor grossly intact throughout PSYCH:  Cognitively intact, oriented to person place and time   ASSESSMENT AND PLAN: .    # Aortic aneurysm:  Aortic root 4.4cm.  Ascending aorta 4.5 on CT 06/2022.  BP well-controlled.  No beta blocker 2/2 bradycardia.  Will get MRA 06/2023.  # Exertional dyspnea: # CAD s/p LAD PCI:  Reports of fatigue, shortness of breath, and occasional chest discomfort. Previous nuclear stress test was normal. Patient has a history of stent placement. Patient's symptoms persist despite normal stress test results, raising concerns about  possible obstructive CAD.  He had non-obstructive disease at cath on 08/2020. -Schedule cardiac catheterization to further evaluate symptoms and rule out cardiac issues. -Check labs prior to procedure.  # Hyperlipidemia Well controlled on Repatha and CoQ10. LDL was 54 on last check in August. -Continue current regimen. -Provide written prescription for Repatha due to change in patient's prescription drug coverage.  # Bradycardia:  Asymptomatic and stable.  Heart rate did augment to the 90s with treadmill stress test.  He doesn't think that this is the cause of his symptoms and isn't interested in a pacemaker.   Follow-up Plan to reassess patient's condition in a couple of months after cardiac catheterization. -Schedule follow-up appointment.     Informed Consent   Shared Decision Making/Informed Consent The risks [stroke (1 in 1000), death (1 in 1000), kidney failure [usually temporary] (1 in 500), bleeding (1 in 200), allergic reaction [possibly serious] (1 in 200)], benefits (diagnostic support and management of coronary  artery disease) and alternatives of a cardiac catheterization were discussed in detail with Mr. Ulatowski and he is willing to proceed.     Dispo: f/u in 2 months.  Signed, Chilton Si, MD

## 2022-12-26 NOTE — H&P (View-Only) (Signed)
 Cardiology Office Note:  .   Date:  12/26/2022  ID:  Joseph Booker, DOB 1950-03-28, MRN [id number] PCP: Pincus Sanes, MD  [facility] Providers Cardiologist:  Chilton Si, MD    History of Present Illness: .   Joseph Booker is a 72 y.o. male with CAD, hyperlipidemia, bradycardia, and mild ascending aorta aneurysm here for follow up.  Joseph Booker had an LAD PCI in 2004. He had a [employer] 06/2016 that revealed LVEF 55% and no ischemia.  He last saw Dr. Eden Emms 02/15/17 at which time he reported some non-exertional shortness of breath.  This has been an ongoing complaint for years.  He had a CPX in 2015 that was normal.  He saw an allergist who suggested that he might have inflammation in his lungs.  His cholesterol was not well controlled on simvastatin so this was switched to atorvastatin.  However his LDL remained above 70 at 40 mg.  It was increased to 80 mg but he developed transaminitis.  He started on Repatha and tolerated it well.  His liver enzymes have been stable.   He had a repeat CT 04/29/20 that showed a stable ascending aortic aneurysm at 4.5 cm. He reported exertional dyspnea. He had a nuclear stress test 06/2020 that showed LVEF 48% and frequent PVC's. Echo 07/2020 showed LVEF 60-65%. Ascending aorta was 4.3 cm. He was seen in the office 08/2020 with chest pain. He underwent left heart cath 08/2020 which showed a patent LAD stent and otherwise mild disease.  He saw [name], NP 12/2021 was doing well.  Echo at that time revealed LVEF 55-60%.  RV function was low normal.  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  Slightly increased from echo 07/2020.  At his visit 06/2022 he reported intermittent chest discomfort and excessive sweating while walking.  He had a nuclear stress test 06/2022 that revealed LVEF 49% with no ischemia.  Chest CT showed that his ascending aorta was 4.5  cm and he had aortic atherosclerosis.  His blood pressure was mildly elevated in the office but controlled at home so no changes were made.  Joseph Booker presents with concerns of increasing fatigue and dyspnea on exertion. He reports a decline in exercise tolerance over the past few months, particularly while walking, a daily activity he had previously performed without difficulty. He describes feeling "out of breath more than I should be" and needing to nap in the afternoon due to excessive tiredness.  He also reports a recent episode of severe arthritis, which he attributes to a change in his Remicade dosage. The arthritis was severe enough to cause significant pain and difficulty getting out of his car, leading to a temporary cessation of his daily walks. The hip pain has since mostly resolved, but knee pain persists.  In addition to these symptoms, he occasionally experiences a discomfort in the chest, which he describes as "not quite right." Despite a relatively normal nuclear stress test conducted less than six months ago, the patient expresses a desire for further cardiac evaluation via catheterization due to these ongoing symptoms.  Joseph Booker is currently on a regimen of Repatha, aspirin, CoQ10, and timolol eye drops. He has been compliant with his medications and has not started any new ones recently. The patient's LDL cholesterol level was last checked in August and was found to be well-controlled.  He has chronic bradycardia  and denies any recent change in his heart rate.  He denies lightheadedness or dizziness.  He reports taking timolol eye drops for years and hasn't noted a change in his heart rate.      ROS:  As per HPI  Studies Reviewed: Marland Kitchen   EKG Interpretation Date/Time:  Monday December 26 2022 08:56:50 EST Ventricular Rate:  45 PR Interval:  158 QRS Duration:  90 QT Interval:  430 QTC Calculation: 371 R Axis:   147  Text Interpretation: Sinus bradycardia Right axis deviation  When compared with ECG of 03-Jul-2017 12:45, QRS axis Shifted right Confirmed by Chilton Si ([id number]) on 12/26/2022 9:21:41 AM   Eugenie Birks Myoview 06/2022:   The study is normal. The study is low risk.   No ST deviation was noted.   LV perfusion is normal. There is no evidence of ischemia. There is no evidence of infarction.   Left ventricular function is abnormal. Global function is mildly reduced. There were no regional wall motion abnormalities. Nuclear stress EF: 49 %. The left ventricular ejection fraction is mildly decreased (45-54%). End diastolic cavity size is normal. End systolic cavity size is normal.   Prior study available for comparison from 06/19/2020.   Normal perfusion No ischemia/infarct Estimated EF 49% no RWMAls Note patients prior myovue done 06/19/20 was also normal perfusion with estimated EF 48% but f/u echo showed normal EF    LHC 08/2020:   Prox LAD lesion is 40% stenosed seen in cranial views only.  Eccentric lesion.   Prox RCA lesion is 25% stenosed.   Mid LAD-2 lesion is 30% stenosed.   Previously placed Mid LAD-1 stent (unknown type) is  widely patent.   LV end diastolic pressure is normal.   There is no aortic valve stenosis.   Dilated aortic root.   Patent stent.  Mild to moderate scattered nonobstructive CAD.   Dilated aortic root to be followed with serial imaging.   Risk Assessment/Calculations:         Physical Exam:   VS:  BP (!) 142/78   Pulse (!) 45   Ht 5\' 8"  (1.727 m)   Wt 156 lb 3.2 oz (70.9 kg)   BMI 23.75 kg/m  , BMI Body mass index is 23.75 kg/m. GENERAL:  Well appearing HEENT: Pupils equal round and reactive, fundi not visualized, oral mucosa unremarkable NECK:  No jugular venous distention, waveform within normal limits, carotid upstroke brisk and symmetric, no bruits, no thyromegaly LUNGS:  Clear to auscultation bilaterally HEART:  RRR.  PMI not displaced or sustained,S1 and S2 within normal limits, no S3, no S4, no clicks, no rubs,  no murmurs ABD:  Flat, positive bowel sounds normal in frequency in pitch, no bruits, no rebound, no guarding, no midline pulsatile mass, no hepatomegaly, no splenomegaly EXT:  2 plus pulses throughout, no edema, no cyanosis no clubbing SKIN:  No rashes no nodules NEURO:  Cranial nerves II through XII grossly intact, motor grossly intact throughout PSYCH:  Cognitively intact, oriented to person place and time   ASSESSMENT AND PLAN: .    # Aortic aneurysm:  Aortic root 4.4cm.  Ascending aorta 4.5 on CT 06/2022.  BP well-controlled.  No beta blocker 2/2 bradycardia.  Will get MRA 06/2023.  # Exertional dyspnea: # CAD s/p LAD PCI:  Reports of fatigue, shortness of breath, and occasional chest discomfort. Previous nuclear stress test was normal. Patient has a history of stent placement. Patient's symptoms persist despite normal stress test results, raising concerns about  possible obstructive CAD.  He had non-obstructive disease at cath on 08/2020. -Schedule cardiac catheterization to further evaluate symptoms and rule out cardiac issues. -Check labs prior to procedure.  # Hyperlipidemia Well controlled on Repatha and CoQ10. LDL was 54 on last check in August. -Continue current regimen. -Provide written prescription for Repatha due to change in patient's prescription drug coverage.  # Bradycardia:  Asymptomatic and stable.  Heart rate did augment to the 90s with treadmill stress test.  He doesn't think that this is the cause of his symptoms and isn't interested in a pacemaker.   Follow-up Plan to reassess patient's condition in a couple of months after cardiac catheterization. -Schedule follow-up appointment.     Informed Consent   Shared Decision Making/Informed Consent The risks [stroke (1 in 1000), death (1 in 1000), kidney failure [usually temporary] (1 in 500), bleeding (1 in 200), allergic reaction [possibly serious] (1 in 200)], benefits (diagnostic support and management of coronary  artery disease) and alternatives of a cardiac catheterization were discussed in detail with Joseph Booker and he is willing to proceed.     Dispo: f/u in 2 months.  Signed, Chilton Si, MD

## 2022-12-26 NOTE — Addendum Note (Signed)
Addended by: Regis Bill B on: 12/26/2022 02:59 PM   Modules accepted: Orders, Level of Service

## 2022-12-26 NOTE — Telephone Encounter (Signed)
Rescheduled patient for 12/19 arrive at 8:30 for 10:30 am with Dr [place] Advised patient, verbalized understanding

## 2022-12-26 NOTE — Telephone Encounter (Signed)
patient called and said that he needs to reschedule catherization that Dr. Duke Salvia got him scheduled for from this morning's appt

## 2022-12-26 NOTE — Telephone Encounter (Signed)
This encounter was created in error - please disregard.

## 2022-12-26 NOTE — Patient Instructions (Signed)
Medication Instructions:  Your physician recommends that you continue on your current medications as directed. Please refer to the Current Medication list given to you today.  *If you need a refill on your cardiac medications before your next appointment, please call your pharmacy*  Testing/Procedures: Your physician has requested that you have a cardiac catheterization. Cardiac catheterization is used to diagnose and/or treat various heart conditions. Doctors may recommend this procedure for a number of different reasons. The most common reason is to evaluate chest pain. Chest pain can be a symptom of coronary artery disease (CAD), and cardiac catheterization can show whether plaque is narrowing or blocking your heart's arteries. This procedure is also used to evaluate the valves, as well as measure the blood flow and oxygen levels in different parts of your heart. For further information please visit [email or web address] Please follow instruction sheet, as given.   Follow-Up: At [facility], you and your health needs are our priority.  As part of our continuing mission to provide you with exceptional heart care, we have created designated Provider Care Teams.  These Care Teams include your primary Cardiologist (physician) and Advanced Practice Providers (APPs -  Physician Assistants and Nurse Practitioners) who all work together to provide you with the care you need, when you need it.  We recommend signing up for the patient portal called "MyChart".  Sign up information is provided on this After Visit Summary.  MyChart is used to connect with patients for Virtual Visits (Telemedicine).  Patients are able to view lab/test results, encounter notes, upcoming appointments, etc.  Non-urgent messages can be sent to your provider as well.   To learn more about what you can do with MyChart, go to [email or web address].    Your next appointment:   2 month(s)  Provider:   [name], NP     You are scheduled for a Cardiac Catheterization on Wednesday, December 18 with Dr. Peter Swaziland.  1. Please arrive at the [facility] (Main Entrance A) at [facility]: [address] [place], [place] [address] at 9:30 AM (This time is 2 hour(s) before your procedure to ensure your preparation).   Free valet parking service is available. You will check in at ADMITTING. The support person will be asked to wait in the waiting room.  It is OK to have someone drop you off and come back when you are ready to be discharged.    Special note: Every effort is made to have your procedure done on time. Please understand that emergencies sometimes delay scheduled procedures.  2. Diet: Do not eat solid foods after midnight.  The patient may have clear liquids until 5am upon the day of the procedure.  3. Labs: You will need to have blood drawn MONDAY   4. Medication instructions in preparation for your procedure:   Contrast Allergy: No   On the morning of your procedure, take your ASPIRIN and any morning medicines NOT listed above.  You may use sips of water.  5. Plan to go home the same day, you will only stay overnight if medically necessary. 6. Bring a current list of your medications and current insurance cards. 7. You MUST have a responsible person to drive you home. 8. Someone MUST be with you the first 24 hours after you arrive home or your discharge will be delayed. 9. Please wear clothes that are easy to get on and off and wear slip-on shoes.  Thank you for allowing [place] to care for you!   --  [facility]

## 2022-12-26 NOTE — Addendum Note (Signed)
Addended by: Regis Bill B on: 12/26/2022 06:55 PM   Modules accepted: Orders

## 2022-12-28 ENCOUNTER — Other Ambulatory Visit (HOSPITAL_BASED_OUTPATIENT_CLINIC_OR_DEPARTMENT_OTHER): Payer: Medicare Other

## 2022-12-30 DIAGNOSIS — Z961 Presence of intraocular lens: Secondary | ICD-10-CM | POA: Diagnosis not present

## 2022-12-30 DIAGNOSIS — H401123 Primary open-angle glaucoma, left eye, severe stage: Secondary | ICD-10-CM | POA: Diagnosis not present

## 2022-12-30 DIAGNOSIS — H52203 Unspecified astigmatism, bilateral: Secondary | ICD-10-CM | POA: Diagnosis not present

## 2022-12-30 DIAGNOSIS — H401111 Primary open-angle glaucoma, right eye, mild stage: Secondary | ICD-10-CM | POA: Diagnosis not present

## 2023-01-02 DIAGNOSIS — I2510 Atherosclerotic heart disease of native coronary artery without angina pectoris: Secondary | ICD-10-CM | POA: Diagnosis not present

## 2023-01-02 DIAGNOSIS — Z01812 Encounter for preprocedural laboratory examination: Secondary | ICD-10-CM | POA: Diagnosis not present

## 2023-01-03 ENCOUNTER — Telehealth: Payer: Self-pay | Admitting: *Deleted

## 2023-01-03 LAB — CBC WITH DIFFERENTIAL/PLATELET
Basophils Absolute: 0.1 10*3/uL (ref 0.0–0.2)
Basos: 2 %
EOS (ABSOLUTE): 0.5 10*3/uL — ABNORMAL HIGH (ref 0.0–0.4)
Eos: 6 %
Hematocrit: 47.8 % (ref 37.5–51.0)
Hemoglobin: 15.9 g/dL (ref 13.0–17.7)
Immature Grans (Abs): 0 10*3/uL (ref 0.0–0.1)
Immature Granulocytes: 0 %
Lymphocytes Absolute: 2.4 10*3/uL (ref 0.7–3.1)
Lymphs: 30 %
MCH: 32.4 pg (ref 26.6–33.0)
MCHC: 33.3 g/dL (ref 31.5–35.7)
MCV: 98 fL — ABNORMAL HIGH (ref 79–97)
Monocytes Absolute: 0.7 10*3/uL (ref 0.1–0.9)
Monocytes: 9 %
Neutrophils Absolute: 4.2 10*3/uL (ref 1.4–7.0)
Neutrophils: 53 %
Platelets: 171 10*3/uL (ref 150–450)
RBC: 4.9 x10E6/uL (ref 4.14–5.80)
RDW: 12.2 % (ref 11.6–15.4)
WBC: 8 10*3/uL (ref 3.4–10.8)

## 2023-01-03 LAB — BASIC METABOLIC PANEL
BUN/Creatinine Ratio: 20 (ref 10–24)
BUN: 19 mg/dL (ref 8–27)
CO2: 20 mmol/L (ref 20–29)
Calcium: 9.3 mg/dL (ref 8.6–10.2)
Chloride: 104 mmol/L (ref 96–106)
Creatinine, Ser: 0.96 mg/dL (ref 0.76–1.27)
Glucose: 92 mg/dL (ref 70–99)
Potassium: 4.5 mmol/L (ref 3.5–5.2)
Sodium: 141 mmol/L (ref 134–144)
eGFR: 64 mL/min/{1.73_m2} (ref >59)

## 2023-01-03 NOTE — Telephone Encounter (Addendum)
Cardiac Catheterization scheduled at [facility] for: Thursday January 05, 2023 10:30 AM Arrival time [facility] Main Entrance A at: 8:30 AM  Nothing to eat after midnight prior to procedure, clear liquids until 5 AM day of procedure.  Medication instructions: -Usual morning medications can be taken with sips of water including aspirin 81 mg.  Plan to go home the same day, you will only stay overnight if medically necessary.  You must have responsible adult to drive you home.  Someone must be with you the first 24 hours after you arrive home.  Reviewed procedure instructions with patient.

## 2023-01-04 ENCOUNTER — Ambulatory Visit: Payer: Medicare Other | Admitting: Gastroenterology

## 2023-01-04 ENCOUNTER — Encounter: Payer: Self-pay | Admitting: Gastroenterology

## 2023-01-04 VITALS — BP 134/80 | HR 56 | Ht 68.0 in | Wt 154.4 lb

## 2023-01-04 DIAGNOSIS — K5190 Ulcerative colitis, unspecified, without complications: Secondary | ICD-10-CM

## 2023-01-04 DIAGNOSIS — R718 Other abnormality of red blood cells: Secondary | ICD-10-CM | POA: Diagnosis not present

## 2023-01-04 DIAGNOSIS — E538 Deficiency of other specified B group vitamins: Secondary | ICD-10-CM

## 2023-01-04 NOTE — Progress Notes (Unsigned)
Joseph Booker    [id number]    [birth year]  Primary Care Physician:Burns, Bobette Mo, MD  Referring Physician: Pincus Sanes, MD [address] [place],  [place] [address]   Chief complaint: Ulcerative colitis  Discussed the use of AI scribe software for clinical note transcription with the patient, who gave verbal consent to proceed.  History of Present Illness   The patient, with a history of ulcerative colitis, presents for a follow-up consultation. He reports no recent bleeding or mucus, with bowel movements averaging three to four times daily, which are formed and without discomfort. He notes occasional urgency and increased frequency post-meal, but denies nocturnal symptoms. He compares his current symptoms to those of his stepson with irritable bowel syndrome.  The patient also reports a history of arthritis-like symptoms, which he attributes to a previous treatment with infliximab. He describes the onset of severe joint pain, particularly in the hip, approximately a week to ten days following a dose of infliximab. The pain was so severe that he had difficulty exiting his car. The joint pain has since subsided significantly, but not completely, after discontinuing infliximab.  The patient has been managing his ulcerative colitis with mesalamine and has not been using enemas. He reports feeling relatively stable and is not currently experiencing any major issues. He has not received a COVID-19 booster shot due to concerns about potential flare-ups of his ulcerative colitis.  The patient also mentions a slightly elevated prostate-specific antigen (PSA) level in the past and plans to request a repeat test. He is also planning to start taking sublingual B12 supplements due to a potential deficiency indicated by slightly off MCV levels in his blood work.  The patient is scheduled for a cardiac catheterization as a precautionary measure, despite feeling that it might be  unnecessary. He expresses a preference for a colonoscopy over starting new medication if his fecal calprotectin levels come back high.     Fecal calprotectin improved to within normal range on most recent check February 16, 2022 He is on infliximab infusions 5 mg/kg   GI Hx: He was in clinical remission on daily mesalamine for almost past 17 years with symptoms of acute severe UC flare since June 2023   He did not have any improvement on prednisone 40 mg daily.  GI stool pathogen panel and C. difficile negative X 2   He is currently on infliximab 5 mg/kg Random drug level for infliximab on 09/28/2021 was 39 with undetectable antibodies Infliximab drug trough 10 with detectable antiinfliximab antibody 48   His symptoms are slowly improving, he noticing some improvement after the fourth infusion of Remicade.  He is not seeing much rectal bleeding or mucus discharge, bowel habits are also improving. He is able to explore more foods and is able to tolerate them.   CMV PCR negative   CRP 1.53 and sed rate 8 on September 16, 2021   Fecal calprotectin 550 on September 21, 2021   Flexible sigmoidoscopy 09/28/2021 - The perianal and digital rectal examinations were normal. - A continuous area of ulcerated mucosa with stigmata of recent bleeding was present in the rectum extending to rectosigmoid. Biopsies were taken with a cold forceps for histology. - Normal mucosa was found sigmoid colon to visualized extent in the descending colon. Retroflexion not performed in the rectum due to proctitis. 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 Circumferential wall thickening involving the distal sigmoid colon extending the level of the rectum not resulting in enteric obstruction. Findings are nonspecific though could be seen provided history of ulcerative colitis. Clinical correlation is advised. If not recently  performed, further evaluation with colonoscopy could be performed as indicated. 2. Suspected hepatic steatosis.  Correlation with LFTs is advised. 3. Prostatomegaly with mass effect on the undersurface of the urinary bladder and diffuse thickening of the urinary bladder wall. Correlation for urinary obstructive symptoms is advised. If not recently performed, further evaluation with DRE could as indicated. 4. Aortic Atherosclerosis (ICD10-I70.0).   He is tolerating mesalamine enema but is unable to retain it for more than 2 hours at a time.  Initially when it was tried few months ago he had severe reaction to it but does not have it anymore   He had a mild flare in April 2022, he was treated with tapered dose of budesonide.  After taking it for 2 months he was experiencing side effects and has since stopped taking it. Prior to the episode of UC flare in April, he was on chronic antibiotics for ocular rosacea.     Colonoscopy April 17, 2020 - The perianal and digital rectal examinations were normal. - Inflammation characterized by altered vascularity, congestion (edema), erythema and scarring was found as medium patches surrounded by normal mucosa in the rectum, in the descending colon, in the splenic flexure, in the transverse colon, in the ascending colon and at the cecum. This was mild in severity, and when compared to previous examinations, the findings are worsened. Biopsies were taken with a cold forceps for histology. - Non-bleeding internal hemorrhoids were found during retroflexion. The hemorrhoids were medium-sized.   1. Surgical [P], right colon - MILDLY ACTIVE CHRONIC COLITIS, CONSISTENT WITH PATIENT'S CLINICAL HISTORY OF ULCERATIVE COLITIS - NEGATIVE FOR GRANULOMAS OR DYSPLASIA 2. Surgical [P], left colon - COLONIC MUCOSA WITH NO SPECIFIC HISTOPATHOLOGIC CHANGES - NEGATIVE FOR ACUTE INFLAMMATION, FEATURES OF CHRONICITY, GRANULOMAS OR DYSPLASIA    Outpatient Encounter  Medications as of 01/04/2023  Medication Sig   aspirin 81 MG tablet Take 81 mg by mouth at bedtime.   Calcium Carb-Cholecalciferol (CALCIUM+D3) 600-20 MG-MCG TABS Take 1 tablet by mouth daily.   Coenzyme Q10 (CO Q-10) 300 MG CAPS Take 300 mg by mouth every morning.   dorzolamide-timolol (COSOPT) 2-0.5 % ophthalmic solution Place 1 drop into the left eye 2 (two) times daily.   Evolocumab (REPATHA SURECLICK) 140 MG/ML SOAJ Inject 140 mg into the skin every 14 (fourteen) days.   Lactase (LACTAID FAST ACT) 9000 units TABS Take 9,000 Units by mouth daily as needed (consuming dairy).   latanoprost (XALATAN) 0.005 % ophthalmic solution Place 1 drop into the left eye at bedtime.   mesalamine (LIALDA) 1.2 g EC tablet TAKE 4 TABLETS BY MOUTH ONCE  DAILY WITH BREAKFAST   mesalamine (ROWASA) 4 g enema Place 60 mLs (4 g total) rectally at bedtime. (Patient taking differently: Place 4 g rectally at bedtime. As needed)   Multiple Vitamins-Minerals (MULTIVITAMIN WITH MINERALS) tablet Take 1 tablet by mouth daily.   timolol (TIMOPTIC) 0.5 % ophthalmic solution Place 1 drop into the right eye 2 (two) times daily.   No facility-administered encounter medications on file as of 01/04/2023.    Allergies as of 01/04/2023 - Review Complete 01/04/2023  Allergen Reaction Noted   Lactose intolerance (gi)  12/29/2022   Lipitor [atorvastatin] Other (See Comments) 04/04/2019    Past Medical  History:  Diagnosis Date   Allergic rhinitis    Allergy    Anxiety state, unspecified    Ascending aortic aneurysm (HCC) 05/15/2020   Bradycardia 05/03/2021   CAD (coronary artery disease)    [name] / nuclear 2005, excellent tolerance no scar or ischemia, EF 51% / nuclear, February, 2012, no scar or ischemia / catheterization March 26, 2010.. 10% in-stent restenosis, minimal other nonobstructive coronary disease, ejection fraction 55%., excellent result; ETT 9/13: ex 13:14, no CP, no ischemic ECG changes   Cancer  (HCC)    basal cell carcinoma x2 on face   Cataract    surgery to both eyes   Elevated blood pressure reading 05/15/2020   Glaucoma, left eye    left eye   History of exercise intolerance 05/23/2013   cardiopulmonary exercise test-- normal functional capacity when compared to matched sedentary norms, borderline ventilatory limitation with the patient's exercise tidal volume reaching 90% of the measured inspiratory capacity ( pt reports no dyspnea), did not appear to be circulatory limitation, normal CPX test   Hyperlipidemia    Nocturia    Osteopenia    Pure hypercholesterolemia 06/12/2009   Thoracic ascending aortic aneurysm [facility])    followed by dr Chilton Si---  last CT 12-29-2016 ,  4.5cm   Ulcerative colitis, universal (HCC)    GI-- dr Lavon Paganini--  dx 1990s  (per note mild Mayo Score 1)   Wears glasses     Past Surgical History:  Procedure Laterality Date   CARDIAC CATHETERIZATION  03/26/2010    dr Milton Ferguson   single vessel CAD w/ patent mLAD stent w/ 10% in-stent restenosis w/ mild to moderate disease in the remainder LAD w/ no lesions that appearedto be flow-limitinf/  normal LVFSF, ef 55%   CARDIOVASCULAR STRESS TEST  06-30-2016   dr Eden Emms   Low risk nuclear study w/ no evidence ishcemia/  normal LV function and wall motion,  nuclear stress ef 55%   COLONOSCOPY  last one 10-27-2016   CORONARY ANGIOPLASTY WITH STENT PLACEMENT  07-25-2002   dr Amil Amen   PTCA w/  DES x1 to mLAD   INGUINAL HERNIA REPAIR Left 2006   INGUINAL HERNIA REPAIR Right 07/03/2017   Procedure: OPEN REPAIR OF RIGHT INGUINAL HERNIA WITH MESH;  Surgeon: Kinsinger, De Blanch, MD;  Location: [facility];  Service: General;  Laterality: Right;   INSERTION OF MESH Right 07/03/2017   Procedure: INSERTION OF MESH;  Surgeon: Sheliah Hatch De Blanch, MD;  Location: [facility] ;  Service: General;  Laterality: Right;   LEFT HEART CATH AND CORONARY ANGIOGRAPHY N/A 09/11/2020   Procedure:  LEFT HEART CATH AND CORONARY ANGIOGRAPHY;  Surgeon: Corky Crafts, MD;  Location: [facility] INVASIVE CV LAB;  Service: Cardiovascular;  Laterality: N/A;   SEPTOPLASTY  1987   w/ RHINOPLASTY   THORACIC AORTOGRAM N/A 09/11/2020   Procedure: THORACIC AORTOGRAM;  Surgeon: Corky Crafts, MD;  Location: [facility] INVASIVE CV LAB;  Service: Cardiovascular;  Laterality: N/A;   TONSILLECTOMY  1959   TRANSTHORACIC ECHOCARDIOGRAM  06-30-2016     dr Duane Boston 55-60%/  trivial AR/ mild dilated ascending aorta, 72mm/  trivial MR/  mild PR and TR     Family History  Problem Relation Age of Onset   Diabetes Mother    Heart disease Mother    Lung disease Mother        ILD   Rheum arthritis Mother    CAD Mother  Heart disease Father 65       MI   Heart attack Father    Asthma Sister    Colon cancer Neg Hx    Esophageal cancer Neg Hx    Pancreatic cancer Neg Hx    Kidney disease Neg Hx    Liver disease Neg Hx    Rectal cancer Neg Hx    Stomach cancer Neg Hx     Social History   Socioeconomic History   Marital status: Married    Spouse name: Tammi   Number of children: 1   Years of education: Not on file   Highest education level: Not on file  Occupational History   Occupation: retired    Associate Professor: RETIRED  Tobacco Use   Smoking status: Never   Smokeless tobacco: Never   Tobacco comments:    Married, 1 stepson, 5 g-kids. Retired Buyer, retail, now Clinical cytogeneticist status: Never Used  Substance and Sexual Activity   Alcohol use: Yes    Comment: occasional wine   Drug use: No   Sexual activity: Yes    Partners: Female  Other Topics Concern   Not on file  Social History Narrative   Exercising regularly: walks at least 5 miles a day, 3-4 days a week.   Social Drivers of Corporate investment banker Strain: Low Risk  (09/27/2022)   Overall Financial Resource Strain (CARDIA)    Difficulty of Paying Living Expenses: Not hard at all  Food Insecurity: No Food  Insecurity (09/27/2022)   Hunger Vital Sign    Worried About Running Out of Food in the Last Year: Never true    Ran Out of Food in the Last Year: Never true  Transportation Needs: No Transportation Needs (09/27/2022)   PRAPARE - Administrator, Civil Service (Medical): No    Lack of Transportation (Non-Medical): No  Physical Activity: Sufficiently Active (09/27/2022)   Exercise Vital Sign    Days of Exercise per Week: 4 days    Minutes of Exercise per Session: 60 min  Stress: No Stress Concern Present (09/27/2022)   [employer] of Occupational Health - Occupational Stress Questionnaire    Feeling of Stress : Only a little  Social Connections: Unknown (09/27/2022)   Social Connection and Isolation Panel [NHANES]    Frequency of Communication with Friends and Family: Twice a week    Frequency of Social Gatherings with Friends and Family: Once a week    Attends Religious Services: Not on Marketing executive or Organizations: No    Attends Banker Meetings: Never    Marital Status: Married  Catering manager Violence: Not At Risk (09/30/2022)   Humiliation, Afraid, Rape, and Kick questionnaire    Fear of Current or Ex-Partner: No    Emotionally Abused: No    Physically Abused: No    Sexually Abused: No      Review of systems: All other review of systems negative except as mentioned in the HPI.   Physical Exam: Vitals:   01/04/23 1448  BP: 134/80  Pulse: (!) 56   Body mass index is 23.48 kg/m. Gen:      No acute distress HEENT:  sclera anicteric CV: s1s2 rrr, no murmur Lungs: B/l clear. Abd:      soft, non-tender; no palpable masses, no distension Ext:    No edema Neuro: alert and oriented x 3 Psych: normal mood and affect  Data  Reviewed:  Reviewed labs, radiology imaging, old records and pertinent past GI work up     Assessment and Plan    Ulcerative Colitis Clinical remission with increased bowel movements (3-4/day) and  occasional urgency. No nocturnal symptoms, bleeding, or mucus. Previously had adverse reaction to Infliximab (arthritis-like symptoms). Currently on Mesalamine. -Continue Mesalamine 4 pills daily. -Order fecal calprotectin test to assess inflammation. If results >100, consider colonoscopy and possibly starting Rinvoq.  Possible Vitamin B12 Deficiency MCV slightly elevated, suggesting possible B12 or folate deficiency. Previous B12 levels in mid-range. -Start over-the-counter sublingual B12 supplement for a month. -Check B12 levels at next blood work.  General Health Maintenance -Plan to request PSA test in January due to previous slightly elevated levels. -Follow-up appointment in March 2025, or sooner if any changes in symptoms or high fecal calprotectin results.          This visit required *** minutes of patient care (this includes precharting, chart review, review of results, face-to-face time used for counseling as well as treatment plan and follow-up. The patient was provided an opportunity to ask questions and all were answered. The patient agreed with the plan and demonstrated an understanding of the instructions.  Iona Beard , MD    CC: Pincus Sanes, MD

## 2023-01-04 NOTE — Patient Instructions (Addendum)
VISIT SUMMARY:  You had a follow-up consultation today to discuss your ulcerative colitis and other health concerns. You reported no recent bleeding or mucus, with bowel movements averaging three to four times daily. You also mentioned occasional urgency and increased frequency post-meal, but no nocturnal symptoms. We also discussed your history of arthritis-like symptoms following infliximab treatment, your slightly elevated PSA levels, and your plans to start sublingual B12 supplements.  YOUR PLAN:  -ULCERATIVE COLITIS: Ulcerative colitis is a chronic condition that causes inflammation and sores in the colon and rectum. You are currently in clinical remission but experiencing increased bowel movements and occasional urgency. Continue taking Mesalamine (4 pills daily). We will order a fecal calprotectin test to assess inflammation. If the results are higher than 100, we may consider a colonoscopy and possibly starting a new medication called Rinvoq.  -POSSIBLE VITAMIN B12 DEFICIENCY: A vitamin B12 deficiency can lead to anemia and other health issues. Your MCV levels suggest a possible deficiency. Start taking an over-the-counter sublingual B12 supplement for a month, and we will check your B12 levels at your next blood work.  -GENERAL HEALTH MAINTENANCE: We will plan to request a PSA test in January due to your previously slightly elevated levels. Your follow-up appointment is scheduled for March 2025, or sooner if there are any changes in your symptoms or if your fecal calprotectin results are high.  INSTRUCTIONS:  Please continue taking Mesalamine as prescribed. Start the sublingual B12 supplement and monitor your symptoms. We will follow up with a PSA test in January and check your B12 levels at your next blood work. Your next appointment is scheduled for March 2025, but please contact us sooner if you experience any changes in your symptoms or if your fecal calprotectin results are high.  I  appreciate the  opportunity to care for you  Thank You   Marsa Aris , MD

## 2023-01-05 ENCOUNTER — Encounter (HOSPITAL_COMMUNITY)
Admission: RE | Disposition: A | Discharge status: Home / Self Care | Payer: Self-pay | Source: Home / Self Care | Attending: Cardiology

## 2023-01-05 ENCOUNTER — Encounter: Payer: Self-pay | Admitting: Gastroenterology

## 2023-01-05 ENCOUNTER — Other Ambulatory Visit: Payer: Self-pay

## 2023-01-05 ENCOUNTER — Ambulatory Visit (HOSPITAL_COMMUNITY)
Admission: RE | Admit: 2023-01-05 | Discharge: 2023-01-05 | Disposition: A | Discharge status: Home / Self Care | Payer: Medicare Other | Attending: Cardiology | Admitting: Cardiology

## 2023-01-05 DIAGNOSIS — I2510 Atherosclerotic heart disease of native coronary artery without angina pectoris: Secondary | ICD-10-CM

## 2023-01-05 DIAGNOSIS — Z79899 Other long term (current) drug therapy: Secondary | ICD-10-CM | POA: Diagnosis not present

## 2023-01-05 DIAGNOSIS — Z7982 Long term (current) use of aspirin: Secondary | ICD-10-CM | POA: Insufficient documentation

## 2023-01-05 DIAGNOSIS — I7121 Aneurysm of the ascending aorta, without rupture: Secondary | ICD-10-CM | POA: Insufficient documentation

## 2023-01-05 DIAGNOSIS — E785 Hyperlipidemia, unspecified: Secondary | ICD-10-CM | POA: Insufficient documentation

## 2023-01-05 DIAGNOSIS — R001 Bradycardia, unspecified: Secondary | ICD-10-CM | POA: Diagnosis not present

## 2023-01-05 DIAGNOSIS — R5383 Other fatigue: Secondary | ICD-10-CM | POA: Insufficient documentation

## 2023-01-05 DIAGNOSIS — I25119 Atherosclerotic heart disease of native coronary artery with unspecified angina pectoris: Secondary | ICD-10-CM | POA: Diagnosis not present

## 2023-01-05 DIAGNOSIS — Z955 Presence of coronary angioplasty implant and graft: Secondary | ICD-10-CM | POA: Diagnosis not present

## 2023-01-05 DIAGNOSIS — R0609 Other forms of dyspnea: Secondary | ICD-10-CM | POA: Insufficient documentation

## 2023-01-05 HISTORY — PX: LEFT HEART CATH AND CORONARY ANGIOGRAPHY: CATH118249

## 2023-01-05 SURGERY — LEFT HEART CATH AND CORONARY ANGIOGRAPHY
Anesthesia: LOCAL

## 2023-01-05 MED ORDER — FENTANYL CITRATE (PF) 100 MCG/2ML IJ SOLN
INTRAMUSCULAR | Status: DC | PRN
  Administered 2023-01-05: 25 ug via INTRAVENOUS

## 2023-01-05 MED ORDER — MIDAZOLAM HCL 2 MG/2ML IJ SOLN
INTRAMUSCULAR | Status: DC | PRN
  Administered 2023-01-05: 1 mg via INTRAVENOUS

## 2023-01-05 MED ORDER — HEPARIN (PORCINE) IN NACL 1000-0.9 UT/500ML-% IV SOLN
INTRAVENOUS | Status: DC | PRN
  Administered 2023-01-05 (×2): 500 mL

## 2023-01-05 MED ORDER — IOHEXOL 350 MG/ML SOLN
INTRAVENOUS | Status: DC | PRN
  Administered 2023-01-05: 40 mL

## 2023-01-05 MED ORDER — SODIUM CHLORIDE 0.9% FLUSH: 3.0000 mL | Freq: Two times a day (BID) | INTRAVENOUS | Status: DC

## 2023-01-05 MED ORDER — LIDOCAINE HCL (PF) 1 % IJ SOLN
INTRAMUSCULAR | Status: DC | PRN
  Administered 2023-01-05: 2 mL

## 2023-01-05 MED ORDER — MIDAZOLAM HCL 2 MG/2ML IJ SOLN
INTRAMUSCULAR | Status: AC
  Filled 2023-01-05: qty 2

## 2023-01-05 MED ORDER — SODIUM CHLORIDE 0.9 % WEIGHT BASED INFUSION: 1.0000 mL/kg/h | INTRAVENOUS | Status: DC

## 2023-01-05 MED ORDER — ASPIRIN 81 MG PO CHEW: 81.0000 mg | CHEWABLE_TABLET | ORAL | Status: DC

## 2023-01-05 MED ORDER — FENTANYL CITRATE (PF) 100 MCG/2ML IJ SOLN
INTRAMUSCULAR | Status: AC
  Filled 2023-01-05: qty 2

## 2023-01-05 MED ORDER — VERAPAMIL HCL 2.5 MG/ML IV SOLN
INTRAVENOUS | Status: DC | PRN
  Administered 2023-01-05: 10 mL via INTRA_ARTERIAL

## 2023-01-05 MED ORDER — SODIUM CHLORIDE 0.9% FLUSH: 3.0000 mL | INTRAVENOUS | Status: DC | PRN

## 2023-01-05 MED ORDER — SODIUM CHLORIDE 0.9 % IV SOLN: 250.0000 mL | INTRAVENOUS | Status: DC | PRN

## 2023-01-05 MED ORDER — HEPARIN SODIUM (PORCINE) 1000 UNIT/ML IJ SOLN
INTRAMUSCULAR | Status: DC | PRN
  Administered 2023-01-05: 3500 [IU] via INTRAVENOUS

## 2023-01-05 MED ORDER — HEPARIN SODIUM (PORCINE) 1000 UNIT/ML IJ SOLN
INTRAMUSCULAR | Status: AC
  Filled 2023-01-05: qty 10

## 2023-01-05 MED ORDER — LIDOCAINE HCL (PF) 1 % IJ SOLN
INTRAMUSCULAR | Status: AC
  Filled 2023-01-05: qty 30

## 2023-01-05 MED ORDER — VERAPAMIL HCL 2.5 MG/ML IV SOLN
INTRAVENOUS | Status: AC
  Filled 2023-01-05: qty 2

## 2023-01-05 MED ORDER — LABETALOL HCL 5 MG/ML IV SOLN
10.0000 mg | INTRAVENOUS | Status: DC | PRN
Start: 2023-01-05 — End: 2023-01-05

## 2023-01-05 MED ORDER — ACETAMINOPHEN 325 MG PO TABS: 650.0000 mg | ORAL_TABLET | ORAL | Status: DC | PRN

## 2023-01-05 MED ORDER — SODIUM CHLORIDE 0.9 % WEIGHT BASED INFUSION: 3.0000 mL/kg/h | INTRAVENOUS | Status: AC

## 2023-01-05 MED ORDER — ONDANSETRON HCL 4 MG/2ML IJ SOLN
4.0000 mg | Freq: Four times a day (QID) | INTRAMUSCULAR | Status: DC | PRN
Start: 2023-01-05 — End: 2023-01-05

## 2023-01-05 MED ORDER — HYDRALAZINE HCL 20 MG/ML IJ SOLN
10.0000 mg | INTRAMUSCULAR | Status: DC | PRN
Start: 2023-01-05 — End: 2023-01-05

## 2023-01-05 SURGICAL SUPPLY — 9 items
CATH INFINITI 5FR JL4 (CATHETERS) IMPLANT
CATH INFINITI JR4 5F (CATHETERS) IMPLANT
DEVICE RAD COMP TR BAND LRG (VASCULAR PRODUCTS) IMPLANT
GLIDESHEATH SLEND SS 6F .021 (SHEATH) IMPLANT
GUIDEWIRE INQWIRE 1.5J.035X260 (WIRE) IMPLANT
INQWIRE 1.5J .035X260CM (WIRE) ×1
PACK CARDIAC CATHETERIZATION (CUSTOM PROCEDURE TRAY) ×2 IMPLANT
SET ATX-X65L (MISCELLANEOUS) IMPLANT
SHEATH PROBE COVER 6X72 (BAG) IMPLANT

## 2023-01-05 NOTE — Progress Notes (Signed)
TR BAND REMOVAL  LOCATION:    right radial  DEFLATED PER PROTOCOL:    Yes.    TIME BAND OFF / DRESSING APPLIED:    1315 gauze dressing applied   SITE UPON ARRIVAL:    Level 0  SITE AFTER BAND REMOVAL:    Level 0  CIRCULATION SENSATION AND MOVEMENT:    Within Normal Limits   Yes.    COMMENTS:   no issues noted

## 2023-01-05 NOTE — Interval H&P Note (Signed)
History and Physical Interval Note:  01/05/2023 10:44 AM  Joseph Booker  has presented today for surgery, with the diagnosis of angina.  The various methods of treatment have been discussed with the patient and family. After consideration of risks, benefits and other options for treatment, the patient has consented to  Procedure(s): LEFT HEART CATH AND CORONARY ANGIOGRAPHY (N/A) as a surgical intervention.  The patient's history has been reviewed, patient examined, no change in status, stable for surgery.  I have reviewed the patient's chart and labs.  Questions were answered to the patient's satisfaction.   Cath Lab Visit (complete for each Cath Lab visit)  Clinical Evaluation Leading to the Procedure:   ACS: No.  Non-ACS:    Anginal Classification: CCS II  Anti-ischemic medical therapy: No Therapy  Non-Invasive Test Results: No non-invasive testing performed  Prior CABG: No previous CABG        Theron Arista [facility] 01/05/2023 10:44 AM

## 2023-01-05 NOTE — Discharge Instructions (Signed)

## 2023-01-06 ENCOUNTER — Encounter (HOSPITAL_COMMUNITY): Payer: Self-pay | Admitting: Cardiology

## 2023-01-12 ENCOUNTER — Other Ambulatory Visit: Payer: Medicare Other

## 2023-01-12 DIAGNOSIS — K5190 Ulcerative colitis, unspecified, without complications: Secondary | ICD-10-CM

## 2023-01-14 LAB — CALPROTECTIN, FECAL: Calprotectin, Fecal: 43 ug/g (ref 0–120)

## 2023-01-23 ENCOUNTER — Encounter: Payer: Self-pay | Admitting: Gastroenterology

## 2023-01-23 ENCOUNTER — Other Ambulatory Visit (HOSPITAL_COMMUNITY): Payer: Self-pay

## 2023-01-23 ENCOUNTER — Telehealth: Payer: Self-pay | Admitting: Pharmacist

## 2023-01-23 NOTE — Telephone Encounter (Signed)
PA for Repatha requested.

## 2023-01-23 NOTE — Telephone Encounter (Signed)
 Pharmacy Patient Advocate Encounter   Received notification from Pt Calls Messages that prior authorization for Repatha  140mg /ml is required/requested.   Insurance verification completed.   The patient is insured through [employer] .   Per test claim on new insurance of [employer] medicare: Refill too soon. PA is not needed at this time. Medication was filled 01/18/23. Next eligible fill date is 03/21/23.

## 2023-01-26 ENCOUNTER — Telehealth: Payer: Self-pay

## 2023-01-26 DIAGNOSIS — H6122 Impacted cerumen, left ear: Secondary | ICD-10-CM | POA: Diagnosis not present

## 2023-01-26 NOTE — Telephone Encounter (Signed)
 Auth Submission: NO AUTH NEEDED Payer: MEDICARE A.B / AARP Medication & CPT/J Code(s) submitted: Remicade  (Infliximab ) J1745 Route of submission (phone, fax, portal):  Phone # Fax # Auth type: Buy/Bill Units/visits requested: 700mg  x 7 doses Reference number:  Approval from: 01/19/22 to 02/17/24

## 2023-01-30 ENCOUNTER — Encounter: Payer: Self-pay | Admitting: Internal Medicine

## 2023-01-30 DIAGNOSIS — Z125 Encounter for screening for malignant neoplasm of prostate: Secondary | ICD-10-CM

## 2023-01-30 DIAGNOSIS — K51211 Ulcerative (chronic) proctitis with rectal bleeding: Secondary | ICD-10-CM

## 2023-02-02 ENCOUNTER — Other Ambulatory Visit: Payer: Medicare Other

## 2023-02-02 DIAGNOSIS — K51211 Ulcerative (chronic) proctitis with rectal bleeding: Secondary | ICD-10-CM | POA: Diagnosis not present

## 2023-02-02 DIAGNOSIS — Z125 Encounter for screening for malignant neoplasm of prostate: Secondary | ICD-10-CM | POA: Diagnosis not present

## 2023-02-02 LAB — CBC WITH DIFFERENTIAL/PLATELET
Basophils Absolute: 0.1 10*3/uL (ref 0.0–0.1)
Basophils Relative: 1.3 % (ref 0.0–3.0)
Eosinophils Absolute: 0.4 10*3/uL (ref 0.0–0.7)
Eosinophils Relative: 6.1 % — ABNORMAL HIGH (ref 0.0–5.0)
HCT: 45.9 % (ref 39.0–52.0)
Hemoglobin: 15.6 g/dL (ref 13.0–17.0)
Lymphocytes Relative: 34.3 % (ref 12.0–46.0)
Lymphs Abs: 2.1 10*3/uL (ref 0.7–4.0)
MCHC: 33.9 g/dL (ref 30.0–36.0)
MCV: 97.8 fL (ref 78.0–100.0)
Monocytes Absolute: 0.5 10*3/uL (ref 0.1–1.0)
Monocytes Relative: 8.8 % (ref 3.0–12.0)
Neutro Abs: 3 10*3/uL (ref 1.4–7.7)
Neutrophils Relative %: 49.5 % (ref 43.0–77.0)
Platelets: 164 10*3/uL (ref 150.0–400.0)
RBC: 4.69 Mil/uL (ref 4.22–5.81)
RDW: 13 % (ref 11.5–15.5)
WBC: 6.1 10*3/uL (ref 4.0–10.5)

## 2023-02-02 LAB — VITAMIN B12: Vitamin B-12: 994 pg/mL — ABNORMAL HIGH (ref 211–911)

## 2023-02-02 LAB — PSA, MEDICARE: PSA: 3.91 ng/mL (ref 0.10–4.00)

## 2023-02-03 ENCOUNTER — Encounter (HOSPITAL_BASED_OUTPATIENT_CLINIC_OR_DEPARTMENT_OTHER): Payer: Self-pay | Admitting: Cardiovascular Disease

## 2023-02-03 ENCOUNTER — Encounter: Payer: Self-pay | Admitting: Internal Medicine

## 2023-02-03 ENCOUNTER — Telehealth: Payer: Self-pay | Admitting: Pharmacy Technician

## 2023-02-03 NOTE — Telephone Encounter (Signed)
Pharmacy Patient Advocate Encounter   Received notification from Fax that prior authorization for repatha is required/requested.   Insurance verification completed.   The patient is insured through [employer] .   Per test claim: PA required; PA submitted to above mentioned insurance via Fax Key/confirmation #[id number] FAXED Status is pending

## 2023-02-06 ENCOUNTER — Other Ambulatory Visit (HOSPITAL_COMMUNITY): Payer: Self-pay

## 2023-02-08 ENCOUNTER — Other Ambulatory Visit (HOSPITAL_COMMUNITY): Payer: Self-pay

## 2023-02-14 ENCOUNTER — Other Ambulatory Visit (HOSPITAL_COMMUNITY): Payer: Self-pay

## 2023-02-16 ENCOUNTER — Other Ambulatory Visit (HOSPITAL_COMMUNITY): Payer: Self-pay

## 2023-02-20 ENCOUNTER — Other Ambulatory Visit (HOSPITAL_COMMUNITY): Payer: Self-pay

## 2023-02-27 ENCOUNTER — Other Ambulatory Visit (HOSPITAL_COMMUNITY): Payer: Self-pay

## 2023-02-28 ENCOUNTER — Other Ambulatory Visit (HOSPITAL_COMMUNITY): Payer: Self-pay

## 2023-02-28 NOTE — Telephone Encounter (Signed)
Pharmacy Patient Advocate Encounter  Received notification from [employer] that Prior Authorization for repatha has been APPROVED from 02/08/23 to 02/08/24   PA #/Case ID/Reference #: [id number]

## 2023-03-15 DIAGNOSIS — H10412 Chronic giant papillary conjunctivitis, left eye: Secondary | ICD-10-CM | POA: Diagnosis not present

## 2023-03-17 ENCOUNTER — Encounter (HOSPITAL_BASED_OUTPATIENT_CLINIC_OR_DEPARTMENT_OTHER): Payer: Self-pay | Admitting: Family

## 2023-03-17 ENCOUNTER — Ambulatory Visit (HOSPITAL_BASED_OUTPATIENT_CLINIC_OR_DEPARTMENT_OTHER): Payer: Medicare Other | Admitting: Family

## 2023-03-17 VITALS — BP 124/80 | HR 57 | Ht 68.5 in | Wt 157.6 lb

## 2023-03-17 DIAGNOSIS — I25118 Atherosclerotic heart disease of native coronary artery with other forms of angina pectoris: Secondary | ICD-10-CM

## 2023-03-17 DIAGNOSIS — I7121 Aneurysm of the ascending aorta, without rupture: Secondary | ICD-10-CM

## 2023-03-17 DIAGNOSIS — E785 Hyperlipidemia, unspecified: Secondary | ICD-10-CM | POA: Diagnosis not present

## 2023-03-17 DIAGNOSIS — I77810 Thoracic aortic ectasia: Secondary | ICD-10-CM

## 2023-03-17 NOTE — Patient Instructions (Addendum)
 Medication Instructions:  Your physician recommends that you continue on your current medications as directed. Please refer to the Current Medication list given to you today.   Testing/Procedures: Please schedule your MRA of the AORTA    Follow-Up: At [facility], you and your health needs are our priority.  As part of our continuing mission to provide you with exceptional heart care, we have created designated Provider Care Teams.  These Care Teams include your primary Cardiologist (physician) and Advanced Practice Providers (APPs -  Physician Assistants and Nurse Practitioners) who all work together to provide you with the care you need, when you need it.  We recommend signing up for the patient portal called "MyChart".  Sign up information is provided on this After Visit Summary.  MyChart is used to connect with patients for Virtual Visits (Telemedicine).  Patients are able to view lab/test results, encounter notes, upcoming appointments, etc.  Non-urgent messages can be sent to your provider as well.   To learn more about what you can do with MyChart, go to [email or web address].    Your next appointment:   6 month(s)  Provider:   Chilton Si, MD, Eligha Bridegroom, NP, or [name], NP    Other Instructions

## 2023-03-17 NOTE — Progress Notes (Signed)
 Cardiology Office Note:  .   Date:  03/17/2023  ID:  Joseph Booker, DOB [birth year], MRN [id number] PCP: Joseph Sanes, MD  [facility] Providers Cardiologist:  Joseph Si, MD    History of Present Illness: .   Joseph Booker is a 73 y.o. male  with a hx of  CAD s/p PCI of LAD in 2004, HLD, mild ascending aorta aneurysm, UC.   CPX in 2015 which was normal. Longstanding history of non exertional shortness of breath for which he has seen an allergist who suggested he might have inflammation in his lungs.  Previous transaminitis on Atorvastatin and previously transitioned to Repatha.   Most recent echo 12/23/2021 normal LVEF 55 to 60%, no RWMA, indeterminate diastolic parameters, RVSF low normal, normal PASP, mild MR, moderate dilation ascending aorta 46 mm, mild dilation aortic root 44 mm.  Lasting 12/2022 noting persistent exertional dyspnea.  Subsequent LHC 01/05/2023 with nonobstructive CAD unchanged from prior study in 2022 (pLAD 40%, M LAD 30%, P RCA 25%, nonstenotic mid LAD 1 lesion previously treated) and normal LVEF.   Presents today for follow up. He is a retired Buyer, retail. Endorses feeling out of breath with exertion which is unchanged from previous.  Recently resumed walking.  Walked 3.3 miles today but feels he is more out of breath and sweating more than he should be for this level of activity.  He verbalizes understanding that his cardiac workup has been unremarkable.  Lasting by pulmonology in 2015, we discussed possible referral but he politely declined today.  ROS: Please see the history of present illness.    All other systems reviewed and are negative.   Studies Reviewed: .        Cardiac Studies & Procedures   ______________________________________________________________________________________________ CARDIAC CATHETERIZATION  CARDIAC CATHETERIZATION 01/05/2023  Narrative   Prox LAD lesion is 40% stenosed.   Mid LAD-2 lesion is 30% stenosed.   Prox RCA  lesion is 25% stenosed.   Non-stenotic Mid LAD-1 lesion was previously treated.   The left ventricular systolic function is normal.   LV end diastolic pressure is moderately elevated.   The left ventricular ejection fraction is 55-65% by visual estimate.  Nonobstructive CAD. Unchanged from  prior study in 2022 Modestly elevated LVEDP 26 mm Hg Normal LV systolic function.   Plan: medical management  Findings Coronary Findings Diagnostic  Dominance: Right  Left Anterior Descending Prox LAD lesion is 40% stenosed. Non-stenotic Mid LAD-1 lesion was previously treated. Mid LAD-2 lesion is 30% stenosed.  Right Coronary Artery Prox RCA lesion is 25% stenosed.  Intervention  No interventions have been documented.   CARDIAC CATHETERIZATION  CARDIAC CATHETERIZATION 09/11/2020  Narrative   Prox LAD lesion is 40% stenosed seen in cranial views only.  Eccentric lesion.   Prox RCA lesion is 25% stenosed.   Mid LAD-2 lesion is 30% stenosed.   Previously placed Mid LAD-1 stent (unknown type) is  widely patent.   LV end diastolic pressure is normal.   There is no aortic valve stenosis.   Dilated aortic root.  Patent stent.  Mild to moderate scattered nonobstructive CAD.  Dilated aortic root to be followed with serial imaging.  Findings Coronary Findings Diagnostic  Dominance: Right  Left Anterior Descending Prox LAD lesion is 40% stenosed. Previously placed Mid LAD-1 stent (unknown type) is  widely patent. Mid LAD-2 lesion is 30% stenosed.  Right Coronary Artery Prox RCA lesion is 25% stenosed.  Intervention  No interventions have been documented.  STRESS TESTS  MYOCARDIAL PERFUSION IMAGING 06/28/2022  Narrative   The study is normal. The study is low risk.   No ST deviation was noted.   LV perfusion is normal. There is no evidence of ischemia. There is no evidence of infarction.   Left ventricular function is abnormal. Global function is mildly reduced. There  were no regional wall motion abnormalities. Nuclear stress EF: 49 %. The left ventricular ejection fraction is mildly decreased (45-54%). End diastolic cavity size is normal. End systolic cavity size is normal.   Prior study available for comparison from 06/19/2020.  Normal perfusion No ischemia/infarct Estimated EF 49% no RWMAls Note patients prior myovue done 06/19/20 was also normal perfusion with estimated EF 48% but f/u echo showed normal EF   ECHOCARDIOGRAM  ECHOCARDIOGRAM COMPLETE 12/23/2021  Narrative ECHOCARDIOGRAM REPORT    Patient Name:   Joseph Booker Date of Exam: 12/23/2021 Medical Rec #:  [id number]   Height:       68.0 in Accession #:    [id number]  Weight:       147.4 lb Date of Birth:  [birth year]   BSA:          1.795 m Patient Age:    5 years    BP:           130/80 mmHg Patient Gender: M           HR:           45 bpm. Exam Location:  Outpatient  Procedure: 2D Echo, 3D Echo, Cardiac Doppler, Color Doppler and Strain Analysis  Indications:    Aneurysm of ascending aorta without rupture (HCC) [I71.21  History:        Patient has prior history of Echocardiogram examinations, most recent 07/17/2020. CAD, Arrythmias:Bradycardia; Risk Factors:Dyslipidemia. Ascending aortic aneurysm;.  Sonographer:    Jeryl Columbia RDCS Referring Phys: [id number] Joseph Booker  IMPRESSIONS   1. Left ventricular ejection fraction, by estimation, is 55 to 60%. Left ventricular ejection fraction by 3D volume is 56 %. The left ventricle has normal function. The left ventricle has no regional wall motion abnormalities. There is mild left ventricular hypertrophy. Left ventricular diastolic parameters are indeterminate. 2. Right ventricular systolic function is low normal. The right ventricular size is mildly enlarged. There is normal pulmonary artery systolic pressure. The estimated right ventricular systolic pressure is 20.8 mmHg. 3. The mitral valve is myxomatous. Mild mitral valve  regurgitation. 4. Leaftlets are thickened and myxomatous appearing. Mild regurgitation.. The tricuspid valve is myxomatous. 5. The aortic valve is tricuspid. Aortic valve regurgitation is mild to moderate. Aortic valve sclerosis/calcification is present, without any evidence of aortic stenosis. 6. Aortic dilatation noted. There is moderate dilatation of the ascending aorta, measuring 46 mm. There is mild dilatation of the aortic root, measuring 44 mm. 7. The inferior vena cava is normal in size with greater than 50% respiratory variability, suggesting right atrial pressure of 3 mmHg.  Comparison(s): 07/17/2020: LVEF 60-65%, ascending aorta, measuring 43 mm. There is mild dilatation of the aortic root, measuring 40 mm.  FINDINGS Left Ventricle: Left ventricular ejection fraction, by estimation, is 55 to 60%. Left ventricular ejection fraction by 3D volume is 56 %. The left ventricle has normal function. The left ventricle has no regional wall motion abnormalities. The left ventricular internal cavity size was normal in size. There is mild left ventricular hypertrophy. Left ventricular diastolic parameters are indeterminate.  Right Ventricle: The right ventricular size is mildly enlarged. No increase in right ventricular  wall thickness. Right ventricular systolic function is low normal. There is normal pulmonary artery systolic pressure. The tricuspid regurgitant velocity is 2.11 m/s, and with an assumed right atrial pressure of 3 mmHg, the estimated right ventricular systolic pressure is 20.8 mmHg.  Left Atrium: Left atrial size was normal in size.  Right Atrium: Right atrial size was normal in size.  Pericardium: There is no evidence of pericardial effusion.  Mitral Valve: The mitral valve is myxomatous. There is moderate thickening of the anterior and posterior mitral valve leaflet(s). Mild mitral valve regurgitation.  Tricuspid Valve: Leaftlets are thickened and myxomatous appearing. Mild  regurgitation. The tricuspid valve is myxomatous. Tricuspid valve regurgitation is mild.  Aortic Valve: The aortic valve is tricuspid. Aortic valve regurgitation is mild to moderate. Aortic regurgitation PHT measures 657 msec. Aortic valve sclerosis/calcification is present, without any evidence of aortic stenosis.  Pulmonic Valve: The pulmonic valve was normal in structure. Pulmonic valve regurgitation is mild.  Aorta: Aortic dilatation noted. There is moderate dilatation of the ascending aorta, measuring 46 mm. There is mild dilatation of the aortic root, measuring 44 mm.  Venous: The inferior vena cava is normal in size with greater than 50% respiratory variability, suggesting right atrial pressure of 3 mmHg.  IAS/Shunts: No atrial level shunt detected by color flow Doppler.   LEFT VENTRICLE PLAX 2D LVIDd:         4.95 cm         Diastology LVIDs:         2.91 cm         LV e' medial:    5.77 cm/s LV PW:         0.79 cm         LV E/e' medial:  6.1 LV IVS:        1.22 cm         LV e' lateral:   10.10 cm/s LVOT diam:     1.90 cm         LV E/e' lateral: 3.5 LV SV:         58 LV SV Index:   32 LVOT Area:     2.84 cm        3D Volume EF LV 3D EF:    Left ventricul ar ejection fraction by 3D volume is 56 %.  3D Volume EF: 3D EF:        56 % LV EDV:       133 ml LV ESV:       59 ml LV SV:        74 ml  RIGHT VENTRICLE RV Basal diam:  3.75 cm RV Mid diam:    2.28 cm RV S prime:     9.25 cm/s TAPSE (M-mode): 2.3 cm  LEFT ATRIUM             Index        RIGHT ATRIUM           Index LA diam:        2.00 cm 1.11 cm/m   RA Area:     11.90 cm LA Vol (A2C):   25.7 ml 14.32 ml/m  RA Volume:   27.00 ml  15.04 ml/m LA Vol (A4C):   26.6 ml 14.82 ml/m LA Biplane Vol: 28.6 ml 15.93 ml/m AORTIC VALVE LVOT Vmax:         81.20 cm/s LVOT Vmean:        51.100 cm/s LVOT VTI:  0.203 m AI PHT:            657 msec AR Vena Contracta: 0.47 cm  AORTA Ao Root diam: 4.40  cm Ao Asc diam:  4.60 cm  MITRAL VALVE               TRICUSPID VALVE MV Area (PHT): 2.76 cm    TR Peak grad:   17.8 mmHg MV Decel Time: 275 msec    TR Vmax:        211.00 cm/s MR Peak grad: 85.0 mmHg MR Vmax:      461.00 cm/s  SHUNTS MV E velocity: 35.40 cm/s  Systemic VTI:  0.20 m MV A velocity: 33.00 cm/s  Systemic Diam: 1.90 cm MV E/A ratio:  1.07  Zoila Shutter MD Electronically signed by Zoila Shutter MD Signature Date/Time: 12/23/2021/12:55:59 PM    Final      CT SCANS  CT CORONARY MORPH W/CTA COR W/SCORE 12/29/2016  Addendum 12/30/2016  9:55 AM ADDENDUM REPORT: 12/30/2016 09:53  EXAM: OVER-READ INTERPRETATION  CT CHEST  The following report is an over-read performed by radiologist Dr. Royal Piedra [facility] on 12/30/2016. This over-read does not include interpretation of cardiac or coronary anatomy or pathology. The coronary CTA and calcium score interpretation by the cardiologist is attached.  COMPARISON:  Chest CT 05/09/2013.  FINDINGS: Aneurysmal dilatation of ascending thoracic aorta (4.5 cm in diameter). Within the visualized portions of the thorax there are no suspicious appearing pulmonary nodules or masses, there is no acute consolidative airspace disease, no pleural effusions, no pneumothorax and no lymphadenopathy. Visualized portions of the upper abdomen are unremarkable. There are no aggressive appearing lytic or blastic lesions noted in the visualized portions of the skeleton.  IMPRESSION: 1. Aneurysmal dilatation of the ascending thoracic aorta (4.5 cm in diameter). Ascending thoracic aortic aneurysm. Recommend semi-annual imaging followup by CTA or MRA and referral to cardiothoracic surgery if not already obtained. This recommendation follows 2010 ACCF/AHA/AATS/ACR/ASA/SCA/SCAI/SIR/STS/SVM Guidelines for the Diagnosis and Management of Patients With Thoracic Aortic Disease. Circulation. 2010; 121:  W098-J191.   Electronically Signed By: Trudie Reed M.D. On: 12/30/2016 09:53  Narrative CLINICAL DATA:  CAD and Aortic Aneurysm  EXAM: Cardiac CTA  MEDICATIONS: Sub lingual nitro. 4mg  and lopressor 0mg   TECHNIQUE: The patient was scanned on a Siemens Force 192 slice scanner. Gantry rotation speed was 250 msecs. Collimation was .6 mm. A 100 kV prospective scan was triggered in the ascending thoracic aorta at 140 HU's Full mA was used between 35% and 75% of the R-R interval. Average HR during the scan was 59 bpm. The 3D data set was interpreted on a dedicated work station using MPR, MIP and VRT modes. A total of 80 cc of contrast was used.  FINDINGS: Non-cardiac: See separate report from [facility]. No significant findings on limited lung and soft tissue windows.  Calcium Score:  Not performed as patient has stent in proximal LAD  Coronary Arteries: Right dominant with no anomalies  LM:  Less than 50% calcified plaque  LAD:  Patent stent in proximal LAD  D1:  Normal  D2:  Normal  Circumflex:  Normal  OM1:  Large branching vessel normal  OM2:  Normal  RCA:  Dominant and normal  PDA:  Normal  PLA:  Normal  Dilatation of the aortic sinuses and proximal aortic root Stable since non contrast CT done 05/09/13 Mild calcific aortic debris Normal arch vessels  Sinus of Valsalva:  3.9 cm  Aortic Root:  4.2 cm  Aortic Arch:  2.6 cm  Descending Thoracic Aorta:  2.5 cm  IMPRESSION: 1) Stable Aortic root dilatation 4.2 cm  2) Patent stent in proximal LAD  3) Normal RCA and circumflex coronary arteries  Charlton Haws  Electronically Signed: By: Charlton Haws M.D. On: 12/29/2016 15:12     ______________________________________________________________________________________________      Risk Assessment/Calculations:             Physical Exam:   VS:  BP 124/80   Pulse (!) 57   Ht 5' 8.5" (1.74 m)   Wt 157 lb 9.6 oz (71.5 kg)   SpO2  98%   BMI 23.61 kg/m    Wt Readings from Last 3 Encounters:  03/17/23 157 lb 9.6 oz (71.5 kg)  01/05/23 150 lb (68 kg)  01/04/23 154 lb 6.4 oz (70 kg)    GEN: Well nourished, well developed in no acute distress NECK: No JVD; No carotid bruits CARDIAC: RRR, no murmurs, rubs, gallops RESPIRATORY:  Clear to auscultation without rales, wheezing or rhonchi  ABDOMEN: Soft, non-tender, non-distended EXTREMITIES:  No edema; No deformity   ASSESSMENT AND PLAN: .    CAD-LHC 12/2022 nonobstructive disease recommended for medical management.  Continue GDMT aspirin, Repatha.  Persistent exertional dyspnea with unremarkable cardiac workup.  We discussed possible referral to pulmonology which she platelet declines at this time. Discussed referral to PREP exercise program which he will consider and contact us if interested.  Aortic aneurysm-CT 06-2018 4 aortic root 4.4 cm, ascending aorta 4.5 cm.  No beta-blocker due to baseline bradycardia.  Continue optimal BP control.  Plan for MRA 06/2023 as previously ordered by Dr. Duke Salvia.  HLD, LDL goal less than 70-08/2022 LDL 54.  Continue Repatha 140 mg q 14 days.  Bradycardia-asymptomatic and stable.  Prior treadmill stress test ruled out chronotropic incompetence.       Dispo: follow up in 6 mos  Signed, Alver Sorrow, NP

## 2023-03-31 DIAGNOSIS — H401111 Primary open-angle glaucoma, right eye, mild stage: Secondary | ICD-10-CM | POA: Diagnosis not present

## 2023-03-31 DIAGNOSIS — H401123 Primary open-angle glaucoma, left eye, severe stage: Secondary | ICD-10-CM | POA: Diagnosis not present

## 2023-04-11 ENCOUNTER — Ambulatory Visit: Payer: Medicare Other | Admitting: Gastroenterology

## 2023-04-14 ENCOUNTER — Ambulatory Visit (INDEPENDENT_AMBULATORY_CARE_PROVIDER_SITE_OTHER): Payer: Medicare Other | Admitting: Gastroenterology

## 2023-04-14 ENCOUNTER — Encounter: Payer: Self-pay | Admitting: Gastroenterology

## 2023-04-14 VITALS — BP 142/72 | HR 52 | Ht 68.5 in | Wt 157.6 lb

## 2023-04-14 DIAGNOSIS — K5190 Ulcerative colitis, unspecified, without complications: Secondary | ICD-10-CM | POA: Diagnosis not present

## 2023-04-14 DIAGNOSIS — R21 Rash and other nonspecific skin eruption: Secondary | ICD-10-CM | POA: Diagnosis not present

## 2023-04-14 DIAGNOSIS — E739 Lactose intolerance, unspecified: Secondary | ICD-10-CM | POA: Diagnosis not present

## 2023-04-14 DIAGNOSIS — M8580 Other specified disorders of bone density and structure, unspecified site: Secondary | ICD-10-CM

## 2023-04-14 DIAGNOSIS — K5130 Ulcerative (chronic) rectosigmoiditis without complications: Secondary | ICD-10-CM

## 2023-04-14 MED ORDER — MESALAMINE 1.2 G PO TBEC: 4.8000 g | DELAYED_RELEASE_TABLET | Freq: Every day | ORAL | 3 refills | Status: AC

## 2023-04-14 MED ORDER — MESALAMINE 1.2 G PO TBEC: 4.8000 g | DELAYED_RELEASE_TABLET | Freq: Every day | ORAL | 3 refills | Status: DC

## 2023-04-14 NOTE — Progress Notes (Signed)
 Joseph Booker    [id number]    [birth year]  Primary Care Physician:Burns, Bobette Mo, MD  Referring Physician: Pincus Sanes, MD [address] [place],  [place] [address]   Chief complaint: Ulcerative colitis  Discussed the use of AI scribe software for clinical note transcription with the patient, who gave verbal consent to proceed.  History of Present Illness   Joseph Booker is a 73 year old male with ulcerative colitis who presents for follow-up of abdominal pain and bowel habits.  He experiences ongoing abdominal pain and bowel habits consistent with his history of ulcerative colitis. He has three to four bowel movements per day, which are soft but formed, with no changes in frequency since December. There is no bleeding or mucus in his stools. He also has a history of lactose intolerance, which may contribute to his symptoms.  He is currently taking mesalamine and requests a prescription refill as his supply is running out. He is not using enemas at this time but would like the option to have them available if needed.  He underwent a colonoscopy in April 2022 and a flexible sigmoidoscopy in September 2023. His last fecal calprotectin test was in December 2024, which was normal. He has not experienced significant pain, weight loss, or other alarming symptoms recently.  He reports a history of a rash that appeared as spots on his arms, initially thought to be bug bites. No new soaps, detergents, or medications that could have caused the rash. His wife did not experience similar symptoms. No joint pain, skin rash, or any new symptoms.     GI Hx: He was in clinical remission on daily mesalamine for almost past 17 years with symptoms of acute severe UC flare since June 2023   He did not have any improvement on prednisone 40 mg daily.  GI stool pathogen panel and C. difficile negative X 2 CMV PCR negative Fecal calprotectin 550 on September 21, 2021  Random drug level for  infliximab on 09/28/2021 was 39 with undetectable antibodies Infliximab drug trough 10 with detectable antiinfliximab antibody 48   He was on infliximab infusions 5 mg/kg, was increased to 10 mg/kg, last dose in July 2024, discontinued infliximab due to reaction with severe arthritis  Fecal calprotectin improved to within normal range on  February 16, 2022      Flexible sigmoidoscopy 09/28/2021 - The perianal and digital rectal examinations were normal. - A continuous area of ulcerated mucosa with stigmata of recent bleeding was present in the rectum extending to rectosigmoid. Biopsies were taken with a cold forceps for histology. - Normal mucosa was found sigmoid colon to visualized extent in the descending colon. Retroflexion not performed in the rectum due to proctitis. 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 Circumferential wall thickening involving the distal sigmoid colon extending the level of the rectum not resulting in enteric obstruction. Findings are nonspecific though could be seen provided history of ulcerative colitis. Clinical correlation is advised. If not recently performed, further evaluation with colonoscopy could be performed as indicated. 2. Suspected hepatic steatosis.  Correlation with LFTs is advised. 3. Prostatomegaly with mass effect on the undersurface of the urinary bladder and diffuse thickening of the urinary bladder wall. Correlation for urinary obstructive symptoms is advised. If not recently performed, further evaluation with DRE could as indicated. 4. Aortic Atherosclerosis (ICD10-I70.0).  He had a mild flare in April 2022, he was treated with tapered dose of budesonide.  After taking it for 2 months he was experiencing side effects and has since stopped taking it. Prior to the episode of UC flare in April, he was on chronic antibiotics for ocular  rosacea.     Colonoscopy April 17, 2020 - The perianal and digital rectal examinations were normal. - Inflammation characterized by altered vascularity, congestion (edema), erythema and scarring was found as medium patches surrounded by normal mucosa in the rectum, in the descending colon, in the splenic flexure, in the transverse colon, in the ascending colon and at the cecum. This was mild in severity, and when compared to previous examinations, the findings are worsened. Biopsies were taken with a cold forceps for histology. - Non-bleeding internal hemorrhoids were found during retroflexion. The hemorrhoids were medium-sized.   1. Surgical [P], right colon - MILDLY ACTIVE CHRONIC COLITIS, CONSISTENT WITH PATIENT'S CLINICAL HISTORY OF ULCERATIVE COLITIS - NEGATIVE FOR GRANULOMAS OR DYSPLASIA 2. Surgical [P], left colon - COLONIC MUCOSA WITH NO SPECIFIC HISTOPATHOLOGIC CHANGES - NEGATIVE FOR ACUTE INFLAMMATION, FEATURES OF CHRONICITY, GRANULOMAS OR DYSPLASIA     Outpatient Encounter Medications as of 04/14/2023  Medication Sig   aspirin 81 MG tablet Take 81 mg by mouth at bedtime.   Calcium Carb-Cholecalciferol (CALCIUM+D3) 600-20 MG-MCG TABS Take 1 tablet by mouth daily.   Coenzyme Q10 (CO Q-10) 300 MG CAPS Take 300 mg by mouth every morning.   Evolocumab (REPATHA SURECLICK) 140 MG/ML SOAJ Inject 140 mg into the skin every 14 (fourteen) days.   Lactase (LACTAID FAST ACT) 9000 units TABS Take 9,000 Units by mouth daily as needed (consuming dairy).   mesalamine (ROWASA) 4 g enema Place 60 mLs (4 g total) rectally at bedtime. (Patient taking differently: Place 4 g rectally at bedtime. As needed)   Multiple Vitamins-Minerals (MULTIVITAMIN WITH MINERALS) tablet Take 1 tablet by mouth daily.   timolol (TIMOPTIC) 0.5 % ophthalmic solution Place 1 drop into both eyes 2 (two) times daily.   [DISCONTINUED] mesalamine (LIALDA) 1.2 g EC tablet TAKE 4 TABLETS BY MOUTH ONCE  DAILY WITH BREAKFAST    mesalamine (LIALDA) 1.2 g EC tablet Take 4 tablets (4.8 g total) by mouth daily with breakfast.   [DISCONTINUED] dorzolamide-timolol (COSOPT) 2-0.5 % ophthalmic solution Place 1 drop into the left eye 2 (two) times daily. (Patient not taking: Reported on 03/17/2023)   [DISCONTINUED] latanoprost (XALATAN) 0.005 % ophthalmic solution Place 1 drop into the left eye at bedtime.   [DISCONTINUED] mesalamine (LIALDA) 1.2 g EC tablet Take 4 tablets (4.8 g total) by mouth daily with breakfast.   No facility-administered encounter medications on file as of 04/14/2023.    Allergies as of 04/14/2023 - Review Complete 04/14/2023  Allergen Reaction Noted   Lactose intolerance (gi)  12/29/2022   Lipitor [atorvastatin] Other (See Comments) 04/04/2019    Past Medical History:  Diagnosis Date   Allergic rhinitis    Allergy    Anxiety state, unspecified    Ascending aortic aneurysm (HCC) 05/15/2020   Bradycardia 05/03/2021   CAD (coronary artery disease)    [name] / nuclear 2005, excellent tolerance no scar or ischemia, EF 51% / nuclear, February, 2012, no scar or ischemia / catheterization March 26, 2010.. 10% in-stent restenosis, minimal other nonobstructive coronary disease, ejection fraction 55%., excellent result; ETT 9/13: ex 13:14, no CP, no ischemic ECG changes   Cancer (HCC)    basal cell carcinoma x2 on face  Cataract    surgery to both eyes   Elevated blood pressure reading 05/15/2020   Glaucoma, left eye    left eye   History of exercise intolerance 05/23/2013   cardiopulmonary exercise test-- normal functional capacity when compared to matched sedentary norms, borderline ventilatory limitation with the patient's exercise tidal volume reaching 90% of the measured inspiratory capacity ( pt reports no dyspnea), did not appear to be circulatory limitation, normal CPX test   Hyperlipidemia    Nocturia    Osteopenia    Pure hypercholesterolemia 06/12/2009   Thoracic ascending aortic  aneurysm [facility])    followed by dr Chilton Si---  last CT 12-29-2016 ,  4.5cm   Ulcerative colitis, universal (HCC)    GI-- dr Lavon Paganini--  dx 1990s  (per note mild Mayo Score 1)   Wears glasses     Past Surgical History:  Procedure Laterality Date   CARDIAC CATHETERIZATION  03/26/2010    dr Milton Ferguson   single vessel CAD w/ patent mLAD stent w/ 10% in-stent restenosis w/ mild to moderate disease in the remainder LAD w/ no lesions that appearedto be flow-limitinf/  normal LVFSF, ef 55%   CARDIOVASCULAR STRESS TEST  06-30-2016   dr Eden Emms   Low risk nuclear study w/ no evidence ishcemia/  normal LV function and wall motion,  nuclear stress ef 55%   COLONOSCOPY  last one 10-27-2016   CORONARY ANGIOPLASTY WITH STENT PLACEMENT  07-25-2002   dr Amil Amen   PTCA w/  DES x1 to mLAD   INGUINAL HERNIA REPAIR Left 2006   INGUINAL HERNIA REPAIR Right 07/03/2017   Procedure: OPEN REPAIR OF RIGHT INGUINAL HERNIA WITH MESH;  Surgeon: Kinsinger, De Blanch, MD;  Location: [facility];  Service: General;  Laterality: Right;   INSERTION OF MESH Right 07/03/2017   Procedure: INSERTION OF MESH;  Surgeon: Sheliah Hatch De Blanch, MD;  Location: [facility];  Service: General;  Laterality: Right;   LEFT HEART CATH AND CORONARY ANGIOGRAPHY N/A 09/11/2020   Procedure: LEFT HEART CATH AND CORONARY ANGIOGRAPHY;  Surgeon: Corky Crafts, MD;  Location: [facility] INVASIVE CV LAB;  Service: Cardiovascular;  Laterality: N/A;   LEFT HEART CATH AND CORONARY ANGIOGRAPHY N/A 01/05/2023   Procedure: LEFT HEART CATH AND CORONARY ANGIOGRAPHY;  Surgeon: Swaziland, Peter M, MD;  Location: [facility] INVASIVE CV LAB;  Service: Cardiovascular;  Laterality: N/A;   SEPTOPLASTY  1987   w/ RHINOPLASTY   THORACIC AORTOGRAM N/A 09/11/2020   Procedure: THORACIC AORTOGRAM;  Surgeon: Corky Crafts, MD;  Location: [facility] INVASIVE CV LAB;  Service: Cardiovascular;  Laterality: N/A;   TONSILLECTOMY  1959   TRANSTHORACIC  ECHOCARDIOGRAM  06-30-2016     dr Duane Boston 55-60%/  trivial AR/ mild dilated ascending aorta, 52mm/  trivial MR/  mild PR and TR     Family History  Problem Relation Age of Onset   Diabetes Mother    Heart disease Mother    Lung disease Mother        ILD   Rheum arthritis Mother    CAD Mother    Heart disease Father 21       MI   Heart attack Father    Asthma Sister    Colon cancer Neg Hx    Esophageal cancer Neg Hx    Pancreatic cancer Neg Hx    Kidney disease Neg Hx    Liver disease Neg Hx    Rectal cancer Neg Hx  Stomach cancer Neg Hx     Social History   Socioeconomic History   Marital status: Married    Spouse name: Tammi   Number of children: 1   Years of education: Not on file   Highest education level: Not on file  Occupational History   Occupation: retired    Associate Professor: RETIRED  Tobacco Use   Smoking status: Never   Smokeless tobacco: Never   Tobacco comments:    Married, 1 stepson, 5 g-kids. Retired Buyer, retail, now Clinical cytogeneticist status: Never Used  Substance and Sexual Activity   Alcohol use: Yes    Comment: occasional wine   Drug use: No   Sexual activity: Yes    Partners: Female  Other Topics Concern   Not on file  Social History Narrative   Exercising regularly: walks at least 5 miles a day, 3-4 days a week.   Social Drivers of Corporate investment banker Strain: Low Risk  (09/27/2022)   Overall Financial Resource Strain (CARDIA)    Difficulty of Paying Living Expenses: Not hard at all  Food Insecurity: No Food Insecurity (09/27/2022)   Hunger Vital Sign    Worried About Running Out of Food in the Last Year: Never true    Ran Out of Food in the Last Year: Never true  Transportation Needs: No Transportation Needs (09/27/2022)   PRAPARE - Administrator, Civil Service (Medical): No    Lack of Transportation (Non-Medical): No  Physical Activity: Sufficiently Active (09/27/2022)   Exercise Vital Sign    Days  of Exercise per Week: 4 days    Minutes of Exercise per Session: 60 min  Stress: No Stress Concern Present (09/27/2022)   [employer] of Occupational Health - Occupational Stress Questionnaire    Feeling of Stress : Only a little  Social Connections: Unknown (09/27/2022)   Social Connection and Isolation Panel [NHANES]    Frequency of Communication with Friends and Family: Twice a week    Frequency of Social Gatherings with Friends and Family: Once a week    Attends Religious Services: Not on Marketing executive or Organizations: No    Attends Banker Meetings: Never    Marital Status: Married  Catering manager Violence: Not At Risk (09/30/2022)   Humiliation, Afraid, Rape, and Kick questionnaire    Fear of Current or Ex-Partner: No    Emotionally Abused: No    Physically Abused: No    Sexually Abused: No      Review of systems: All other review of systems negative except as mentioned in the HPI.   Physical Exam: Vitals:   04/14/23 0850  BP: (!) 142/72  Pulse: (!) 52  SpO2: 95%   Body mass index is 23.61 kg/m. Gen:      No acute distress HEENT:  sclera anicteric CV: s1s2 rrr, no murmur Lungs: B/l clear. Abd:      soft, non-tender; no palpable masses, no distension Ext:    No edema Neuro: alert and oriented x 3 Psych: normal mood and affect  Data Reviewed:  Reviewed labs, radiology imaging, old records and pertinent past GI work up     Assessment and Plan    Ulcerative Colitis Ulcerative colitis is well-managed with mesalamine, with no bleeding or mucus. He has 3-4 soft, formed bowel movements daily. There is no current proctitis. Stool test was normal. Mesalamine is well tolerated without significant side effects,  including hair loss. It is not an immunosuppressive and helps decrease cancer risk by controlling inflammation. Fecal calprotectin, sensitive for detecting even subclinical inflammation, will be monitored annually unless  symptoms change. - Prescribe mesalamine tablets - Repeat fecal calprotectin test in June or July - Schedule colonoscopy for September - Monitor for signs of proctitis and use enemas if needed - Continue high fiber diet  Lactose Intolerance Lactose intolerance may contribute to softer stools. High fiber intake also affects stool consistency.  Osteopenia Osteopenia with improved bone density since 2024. Inflammation and steroid use can impact bone density. - Repeat bone density scan in 2026  Skin Rash Reports transient skin rash, possibly due to allergic reaction or bug bites. Rash is not consistent with psoriasis or medication reaction. Could be eczema or related to environmental exposure. - Monitor for recurrence or persistence of rash  Follow-up Follow-up in six months to discuss fecal calprotectin test and colonoscopy results. Blood work will be performed during the colonoscopy visit. - Schedule follow-up appointment in six months   The patient was provided an opportunity to ask questions and all were answered. The patient agreed with the plan and demonstrated an understanding of the instructions.  Iona Beard , MD    CC: Pincus Sanes, MD

## 2023-04-14 NOTE — Patient Instructions (Addendum)
 VISIT SUMMARY:  During your visit, we discussed your ongoing abdominal pain and bowel habits related to your ulcerative colitis, as well as your history of lactose intolerance and a recent skin rash. We reviewed your current medications and planned for future tests and follow-ups.  YOUR PLAN:  -ULCERATIVE COLITIS: Ulcerative colitis is a chronic condition that causes inflammation in the colon. Your condition is well-managed with mesalamine, and you are experiencing 3-4 soft, formed bowel movements daily without bleeding or mucus. We will continue your mesalamine prescription, monitor your condition with a fecal calprotectin test in June or July, and schedule a colonoscopy for September. Please monitor for signs of proctitis and use enemas if needed. Continue your high fiber diet.  -LACTOSE INTOLERANCE: Lactose intolerance means your body has difficulty digesting lactose, a sugar found in dairy products. This may contribute to your softer stools. Your high fiber intake also affects stool consistency.  -OSTEOPENIA: Osteopenia is a condition where bone density is lower than normal. Your bone density has improved since 2024. We will repeat your bone density scan in 2026 to monitor your condition.  -SKIN RASH: You reported a transient skin rash that may be due to an allergic reaction or bug bites. It does not appear to be related to psoriasis or medication. Please monitor for any recurrence or persistence of the rash.  INSTRUCTIONS:  Please schedule a follow-up appointment in six months to discuss the results of your fecal calprotectin test and colonoscopy. Blood work will be performed during your colonoscopy visit.  I appreciate the  opportunity to care for you  Thank You   Marsa Aris , MD

## 2023-04-17 ENCOUNTER — Encounter: Payer: Self-pay | Admitting: Gastroenterology

## 2023-05-12 DIAGNOSIS — H401123 Primary open-angle glaucoma, left eye, severe stage: Secondary | ICD-10-CM | POA: Diagnosis not present

## 2023-05-17 DIAGNOSIS — H401123 Primary open-angle glaucoma, left eye, severe stage: Secondary | ICD-10-CM | POA: Diagnosis not present

## 2023-05-29 DIAGNOSIS — L821 Other seborrheic keratosis: Secondary | ICD-10-CM | POA: Diagnosis not present

## 2023-05-29 DIAGNOSIS — D2262 Melanocytic nevi of left upper limb, including shoulder: Secondary | ICD-10-CM | POA: Diagnosis not present

## 2023-05-29 DIAGNOSIS — L565 Disseminated superficial actinic porokeratosis (DSAP): Secondary | ICD-10-CM | POA: Diagnosis not present

## 2023-05-29 DIAGNOSIS — D2271 Melanocytic nevi of right lower limb, including hip: Secondary | ICD-10-CM | POA: Diagnosis not present

## 2023-05-29 DIAGNOSIS — L570 Actinic keratosis: Secondary | ICD-10-CM | POA: Diagnosis not present

## 2023-05-29 DIAGNOSIS — L738 Other specified follicular disorders: Secondary | ICD-10-CM | POA: Diagnosis not present

## 2023-05-29 DIAGNOSIS — B078 Other viral warts: Secondary | ICD-10-CM | POA: Diagnosis not present

## 2023-06-22 ENCOUNTER — Ambulatory Visit (HOSPITAL_COMMUNITY)
Admission: RE | Admit: 2023-06-22 | Discharge: 2023-06-22 | Disposition: A | Discharge status: Home / Self Care | Payer: Medicare Other | Source: Ambulatory Visit | Attending: Cardiovascular Disease | Admitting: Cardiovascular Disease

## 2023-06-22 DIAGNOSIS — I7121 Aneurysm of the ascending aorta, without rupture: Secondary | ICD-10-CM | POA: Insufficient documentation

## 2023-06-22 DIAGNOSIS — I517 Cardiomegaly: Secondary | ICD-10-CM | POA: Diagnosis not present

## 2023-06-22 MED ORDER — GADOBUTROL 1 MMOL/ML IV SOLN
7.0000 mL | Freq: Once | INTRAVENOUS | Status: AC | PRN
  Administered 2023-06-22: 7 mL via INTRAVENOUS

## 2023-06-23 ENCOUNTER — Ambulatory Visit: Payer: Self-pay | Admitting: Cardiovascular Disease

## 2023-06-24 DIAGNOSIS — S80862A Insect bite (nonvenomous), left lower leg, initial encounter: Secondary | ICD-10-CM | POA: Diagnosis not present

## 2023-07-19 DIAGNOSIS — H401111 Primary open-angle glaucoma, right eye, mild stage: Secondary | ICD-10-CM | POA: Diagnosis not present

## 2023-07-19 DIAGNOSIS — H401123 Primary open-angle glaucoma, left eye, severe stage: Secondary | ICD-10-CM | POA: Diagnosis not present

## 2023-08-14 ENCOUNTER — Encounter: Payer: Self-pay | Admitting: Gastroenterology

## 2023-08-14 MED ORDER — MESALAMINE 4 G RE ENEM: 4.0000 g | ENEMA | Freq: Every day | RECTAL | 1 refills | Status: AC

## 2023-09-06 NOTE — Progress Notes (Unsigned)
    Subjective:    Patient ID: Joseph Booker, male    DOB: [birth year], 73 y.o.   MRN: [id number]      HPI Saben is here for No chief complaint on file.        Medications and allergies reviewed with patient and updated if appropriate.  Current Outpatient Medications on File Prior to Visit  Medication Sig Dispense Refill   aspirin  81 MG tablet Take 81 mg by mouth at bedtime.     Calcium  Carb-Cholecalciferol (CALCIUM +D3) 600-20 MG-MCG TABS Take 1 tablet by mouth daily.     Coenzyme Q10 (CO Q-10) 300 MG CAPS Take 300 mg by mouth every morning.     Evolocumab  (REPATHA  SURECLICK) 140 MG/ML SOAJ Inject 140 mg into the skin every 14 (fourteen) days. 6 mL 3   Lactase (LACTAID FAST ACT) 9000 units TABS Take 9,000 Units by mouth daily as needed (consuming dairy).     mesalamine  (LIALDA ) 1.2 g EC tablet Take 4 tablets (4.8 g total) by mouth daily with breakfast. 360 tablet 3   mesalamine  (ROWASA ) 4 g enema Place 60 mLs (4 g total) rectally at bedtime. 840 mL 1   Multiple Vitamins-Minerals (MULTIVITAMIN WITH MINERALS) tablet Take 1 tablet by mouth daily.     timolol (TIMOPTIC) 0.5 % ophthalmic solution Place 1 drop into both eyes 2 (two) times daily.     No current facility-administered medications on file prior to visit.    Review of Systems     Objective:  There were no vitals filed for this visit. BP Readings from Last 3 Encounters:  04/14/23 (!) 142/72  03/17/23 124/80  01/05/23 108/74   Wt Readings from Last 3 Encounters:  04/14/23 157 lb 9.6 oz (71.5 kg)  03/17/23 157 lb 9.6 oz (71.5 kg)  01/05/23 150 lb (68 kg)   There is no height or weight on file to calculate BMI.    Physical Exam         Assessment & Plan:    See Problem List for Assessment and Plan of chronic medical problems.

## 2023-09-07 ENCOUNTER — Encounter: Payer: Self-pay | Admitting: Internal Medicine

## 2023-09-07 ENCOUNTER — Ambulatory Visit: Payer: Self-pay | Admitting: Internal Medicine

## 2023-09-07 ENCOUNTER — Ambulatory Visit (INDEPENDENT_AMBULATORY_CARE_PROVIDER_SITE_OTHER): Admitting: Internal Medicine

## 2023-09-07 VITALS — BP 146/80 | HR 48 | Temp 98.4°F | Ht 68.5 in | Wt 149.4 lb

## 2023-09-07 DIAGNOSIS — R200 Anesthesia of skin: Secondary | ICD-10-CM

## 2023-09-07 DIAGNOSIS — R5383 Other fatigue: Secondary | ICD-10-CM

## 2023-09-07 DIAGNOSIS — R300 Dysuria: Secondary | ICD-10-CM | POA: Diagnosis not present

## 2023-09-07 DIAGNOSIS — R739 Hyperglycemia, unspecified: Secondary | ICD-10-CM | POA: Diagnosis not present

## 2023-09-07 DIAGNOSIS — R202 Paresthesia of skin: Secondary | ICD-10-CM

## 2023-09-07 LAB — URINALYSIS, ROUTINE W REFLEX MICROSCOPIC
Bilirubin Urine: NEGATIVE
Ketones, ur: NEGATIVE
Leukocytes,Ua: NEGATIVE
Nitrite: NEGATIVE
Specific Gravity, Urine: 1.025 (ref 1.000–1.030)
Total Protein, Urine: NEGATIVE
Urine Glucose: NEGATIVE
Urobilinogen, UA: 0.2 (ref 0.0–1.0)
pH: 6 (ref 5.0–8.0)

## 2023-09-07 LAB — CBC WITH DIFFERENTIAL/PLATELET
Basophils Absolute: 0.1 K/uL (ref 0.0–0.1)
Basophils Relative: 1 % (ref 0.0–3.0)
Eosinophils Absolute: 0.2 K/uL (ref 0.0–0.7)
Eosinophils Relative: 3.7 % (ref 0.0–5.0)
HCT: 47.9 % (ref 39.0–52.0)
Hemoglobin: 16 g/dL (ref 13.0–17.0)
Lymphocytes Relative: 33.6 % (ref 12.0–46.0)
Lymphs Abs: 2.1 K/uL (ref 0.7–4.0)
MCHC: 33.4 g/dL (ref 30.0–36.0)
MCV: 96.5 fl (ref 78.0–100.0)
Monocytes Absolute: 0.5 K/uL (ref 0.1–1.0)
Monocytes Relative: 8.6 % (ref 3.0–12.0)
Neutro Abs: 3.4 K/uL (ref 1.4–7.7)
Neutrophils Relative %: 53.1 % (ref 43.0–77.0)
Platelets: 187 K/uL (ref 150.0–400.0)
RBC: 4.96 Mil/uL (ref 4.22–5.81)
RDW: 13.1 % (ref 11.5–15.5)
WBC: 6.4 K/uL (ref 4.0–10.5)

## 2023-09-07 LAB — COMPREHENSIVE METABOLIC PANEL WITH GFR
ALT: 15 U/L (ref 0–53)
AST: 24 U/L (ref 0–37)
Albumin: 4.4 g/dL (ref 3.5–5.2)
Alkaline Phosphatase: 49 U/L (ref 39–117)
BUN: 20 mg/dL (ref 6–23)
CO2: 28 meq/L (ref 19–32)
Calcium: 9.9 mg/dL (ref 8.4–10.5)
Chloride: 103 meq/L (ref 96–112)
Creatinine, Ser: 1.08 mg/dL (ref 0.40–1.50)
GFR: 69.08 mL/min (ref >60.00)
Glucose, Bld: 100 mg/dL — ABNORMAL HIGH (ref 70–99)
Potassium: 4.3 meq/L (ref 3.5–5.1)
Sodium: 140 meq/L (ref 135–145)
Total Bilirubin: 1.2 mg/dL (ref 0.2–1.2)
Total Protein: 7.5 g/dL (ref 6.0–8.3)

## 2023-09-07 LAB — VITAMIN B12: Vitamin B-12: 569 pg/mL (ref 211–911)

## 2023-09-07 LAB — HEMOGLOBIN A1C: Hgb A1c MFr Bld: 5.7 % (ref 4.6–6.5)

## 2023-09-07 LAB — TSH: TSH: 1.12 u[IU]/mL (ref 0.35–5.50)

## 2023-09-07 NOTE — Assessment & Plan Note (Signed)
 Acute Has had some dysuria, weak stream, frequency during the day more than at night and some urgency at times UA, urine culture to rule out UTI Discussed symptoms could also be consistent with BPH He does follow with urology and can discuss with them at his next visit if he does not have a UTI

## 2023-09-07 NOTE — Assessment & Plan Note (Signed)
 Subacute Has been experiencing increased fatigue Gets good sleep and exercises regularly.  He eats a healthy diet No obvious cause for fatigue Will check CBC, CMP and TSH

## 2023-09-07 NOTE — Assessment & Plan Note (Signed)
 History of hyperglycemia and has some symptoms concerning for diabetes He is concerned because he has a family history of diabetes Check A1c He will continue regular exercise, he is working on weight loss and typically eats fairly healthy

## 2023-09-07 NOTE — Patient Instructions (Addendum)
      Blood work was ordered.       Medications changes include :   None    Prediabetes is A1c 5.7% - 6.4% Diabetes is an A1c of 6.5% or more

## 2023-09-07 NOTE — Assessment & Plan Note (Signed)
 Chronic Going on for at least a year and a half Numbness in toes-no symptoms in hands or fingers May have been a slight worsening or progression into the foot He is concerned about this possibly indicating diabetes Will recheck B12 levels, A1c, CBC, CMP, TSH Discussed possible referral to neurology for further evaluation-EMG-he will let me know if he would like to do that

## 2023-09-08 LAB — URINE CULTURE: Result:: NO GROWTH

## 2023-09-20 ENCOUNTER — Ambulatory Visit (INDEPENDENT_AMBULATORY_CARE_PROVIDER_SITE_OTHER): Admitting: Nurse Practitioner

## 2023-09-20 ENCOUNTER — Other Ambulatory Visit (HOSPITAL_BASED_OUTPATIENT_CLINIC_OR_DEPARTMENT_OTHER): Payer: Self-pay | Admitting: Nurse Practitioner

## 2023-09-20 ENCOUNTER — Encounter (HOSPITAL_BASED_OUTPATIENT_CLINIC_OR_DEPARTMENT_OTHER): Payer: Self-pay | Admitting: Nurse Practitioner

## 2023-09-20 VITALS — BP 124/78 | HR 48 | Ht 68.0 in | Wt 148.2 lb

## 2023-09-20 DIAGNOSIS — R0609 Other forms of dyspnea: Secondary | ICD-10-CM

## 2023-09-20 DIAGNOSIS — R030 Elevated blood-pressure reading, without diagnosis of hypertension: Secondary | ICD-10-CM

## 2023-09-20 DIAGNOSIS — I340 Nonrheumatic mitral (valve) insufficiency: Secondary | ICD-10-CM

## 2023-09-20 DIAGNOSIS — I7121 Aneurysm of the ascending aorta, without rupture: Secondary | ICD-10-CM | POA: Diagnosis not present

## 2023-09-20 DIAGNOSIS — I25118 Atherosclerotic heart disease of native coronary artery with other forms of angina pectoris: Secondary | ICD-10-CM | POA: Diagnosis not present

## 2023-09-20 DIAGNOSIS — E785 Hyperlipidemia, unspecified: Secondary | ICD-10-CM | POA: Diagnosis not present

## 2023-09-20 DIAGNOSIS — I359 Nonrheumatic aortic valve disorder, unspecified: Secondary | ICD-10-CM

## 2023-09-20 LAB — LIPID PANEL
Chol/HDL Ratio: 2.2 ratio (ref 0.0–5.0)
Cholesterol, Total: 105 mg/dL (ref 100–199)
HDL: 48 mg/dL (ref >39)
LDL Chol Calc (NIH): 42 mg/dL (ref 0–99)
Triglycerides: 71 mg/dL (ref 0–149)
VLDL Cholesterol Cal: 15 mg/dL (ref 5–40)

## 2023-09-20 MED ORDER — PRALUENT 150 MG/ML ~~LOC~~ SOAJ: 150.0000 mg | SUBCUTANEOUS | 2 refills | Status: DC

## 2023-09-20 NOTE — Progress Notes (Signed)
 Cardiology Office Note   Date:  09/20/2023  ID:  Joseph Booker, DOB [birth year], MRN [id number] PCP: Geofm Glade PARAS, MD  [facility] Providers Cardiologist:  Annabella Scarce, MD     PMH CAD s/p PCI of LAD in 2004 Hyperlipidemia Mild ascending aortic aneurysm Ulcerative colitis  CPX in 2015 was normal.  Longstanding history of nonexertional shortness of breath for which he has seen an allergist who suggested that he may have inflammation in lungs.  Previous transaminitis on atorvastatin  and transition to Repatha .  Echo 12/2021 normal LVEF 55 to 60%, no RWMA, indeterminate diastolic parameters, RV SF low normal, normal PASP, mild MR, moderate dilatation ascending aorta 46 mm, mild dilation aortic root 44 mm.  Seen 12/2022 noting persistent exertional dyspnea.  Subsequent LHC 01/05/2023 with nonobstructive CAD unchanged from prior study in 2022 (pLAD 40%, mLAD 30%, pRCA 25%, nonstenotic mid LAD 1 lesion previously treated), normal LVEF.  Last cardiology clinic visit was 03/17/2023 with [name], NP.  He noted exertional dyspnea.  He is a retired Buyer, retail.  Had walked 3.3 miles that day feeling more out of breath and sweating more than he thought he showed for that level of activity.  Seen by pulmonology in 2015 and possible referral was discussed but he politely declined.  He was asked to consider referral to prep exercise program.  Plan for MRA 06/2023 for evaluation of aortic aneurysm.  MRA 06/22/2023 with stable aneurysm of tubular ascending aorta measuring 4.5 cm.  History of Present Illness  Discussed the use of AI scribe software for clinical note transcription with the patient, who gave verbal consent to proceed.  History of Present Illness Joseph Booker is a very pleasant 73 year old male with coronary artery disease who presents for annual follow-up. He reports continued DOE which he feels is out of context to the activity of walking 5-6 miles daily. He also reports  profuse sweating.  He has undergone multiple cardiac tests which have been unrevealing, including most recent [facility] 01/05/2023 which revealed proximal LAD lesion 40%, mid LAD to lesion 30%, proximal RCA lesion 25%, and nonstenotic mid LAD 1 lesion which was previously treated.  No significant changes from study in 2022.  He reported prior pulmonary function testing which was essentially inconclusive due to difficulty exhaling fully, even though he felt he was exhaling completely. He monitors BP at home, with a recent reading of 135/80 mmHg. He is reluctant to start blood pressure medication due to potential side effects and suspects white coat syndrome as a factor in elevated office readings. Currently taking Repatha  for cholesterol management but suspects it causes a runny nose. He is considering switching to a similar medication to alleviate this side effect. He is concerned about his risk for diabetes due to a family history and has lost eight pounds over the last few months by increasing his walking and avoiding added sugars and sodas.   ROS: See HPI  Studies Reviewed      No results found for: LIPOA  Risk Assessment/Calculations           Physical Exam VS:  BP 124/78   Pulse (!) 48   Ht 5' 8 (1.727 m)   Wt 148 lb 3.2 oz (67.2 kg)   SpO2 99%   BMI 22.53 kg/m    Wt Readings from Last 3 Encounters:  09/20/23 148 lb 3.2 oz (67.2 kg)  09/07/23 149 lb 6 oz (67.8 kg)  04/14/23 157 lb 9.6 oz (71.5 kg)  GEN: Well nourished, well developed in no acute distress NECK: No JVD; No carotid bruits CARDIAC: RRR, no murmurs, rubs, gallops RESPIRATORY:  Clear to auscultation without rales, wheezing or rhonchi  ABDOMEN: Soft, non-tender, non-distended EXTREMITIES:  No edema; No deformity    Assessment & Plan Chronic exertional dyspnea  History of exertional dyspnea and excessive sweating for several years.  He is active with walking several miles per day.  No chest pain, palpitations,  presyncope, syncope.  He does not have orthopnea or PND.  Previous tests showed no significant abnormalities, however he felt that previous CPX testing was inconclusive due to his inability to exhale appropriately. - We will get cardiopulmonary stress test to evaluate heart and lung function during exercise  CAD History of mid LAD stent in 2004.  Most recent catheterization 12/2022 revealed nonobstructive disease, no significant change from previous study in 2022.  Modestly elevated LVEDP at 26 mmHg.  He denies chest pain.  Chronic dyspnea as noted above, not significantly worsened recently and not associated with orthopnea or PND. 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 Mild to moderate aortic valve regurgitation. Chronic DOE as noted above. No chest pain, palpitations, syncope. -We will get echocardiogram for surveillance of valve function  Thoracic aortic aneurysm Stable ascending thoracic aneurysm at 4.5 cm on MR 06/22/2023. - We are getting echo to monitor valve function - Continue routine monitoring and consider referral to CT surgery if aneurysm > 4.5 cm on echo  Elevated BP reading   BP initially elevated but improved on my recheck.  BP occasionally elevated at home, however generally well-controlled. He is not currently on anti-hypertensive agent.  - Continue routine monitoring for goal < 140/80  Hyperlipidemia LDL goal < 70, status post Repatha  with adverse effect   Lipid panel completed 09/06/2022 with total cholesterol 122, triglycerides 109, HDL 46, and LDL 54.  Lipids have been very well-controlled on Repatha , unfortunately he has significant rhinorrhea following Repatha  injections.  He would like to consider switching to Praluent  pending insurance approval.  - We will attempt to seek approval for Praluent  150 every 14 days  - We will update lipid  panel today   Dispo: 6 months with Dr. Raford or APP  Signed, Rosaline Bane, NP-C

## 2023-09-20 NOTE — Patient Instructions (Signed)
 Medication Instructions:  STOP REPATHA    START PRULENT EVERY 2 WEEKS   *If you need a refill on your cardiac medications before your next appointment, please call your pharmacy*  Lab Work: LIPID TODAY   If you have labs (blood work) drawn today and your tests are completely normal, you will receive your results only by: MyChart Message (if you have MyChart) OR A paper copy in the mail If you have any lab test that is abnormal or we need to change your treatment, we will call you to review the results.  Testing/Procedures: Your physician has requested that you have an echocardiogram. Echocardiography is a painless test that uses sound waves to create images of your heart. It provides your doctor with information about the size and shape of your heart and how well your heart's chambers and valves are working. This procedure takes approximately one hour. There are no restrictions for this procedure. Please do NOT wear cologne, perfume, aftershave, or lotions (deodorant is allowed). Please arrive 15 minutes prior to your appointment time.  Please note: We ask at that you not bring children with you during ultrasound (echo/ vascular) testing. Due to room size and safety concerns, children are not allowed in the ultrasound rooms during exams. Our front office staff cannot provide observation of children in our lobby area while testing is being conducted. An adult accompanying a patient to their appointment will only be allowed in the ultrasound room at the discretion of the ultrasound technician under special circumstances. We apologize for any inconvenience.  Your physician has recommended that you have a cardiopulmonary stress test (CPX). CPX testing is a non-invasive measurement of heart and lung function. It replaces a traditional treadmill stress test. This type of test provides a tremendous amount of information that relates not only to your present condition but also for future outcomes. This  test combines measurements of you ventilation, respiratory gas exchange in the lungs, electrocardiogram (EKG), blood pressure and physical response before, during, and following an exercise protocol.  Follow-Up: At [facility], you and your health needs are our priority.  As part of our continuing mission to provide you with exceptional heart care, our providers are all part of one team.  This team includes your primary Cardiologist (physician) and Advanced Practice Providers or APPs (Physician Assistants and Nurse Practitioners) who all work together to provide you with the care you need, when you need it.  Your next appointment:   6 month(s)  Provider:   Annabella Scarce, MD, Rosaline Bane, NP, or [name], NP    We recommend signing up for the patient portal called MyChart.  Sign up information is provided on this After Visit Summary.  MyChart is used to connect with patients for Virtual Visits (Telemedicine).  Patients are able to view lab/test results, encounter notes, upcoming appointments, etc.  Non-urgent messages can be sent to your provider as well.   To learn more about what you can do with MyChart, go to [email or web address].

## 2023-09-21 ENCOUNTER — Encounter: Payer: Self-pay | Admitting: Gastroenterology

## 2023-09-21 ENCOUNTER — Other Ambulatory Visit (HOSPITAL_COMMUNITY): Payer: Self-pay

## 2023-09-21 ENCOUNTER — Ambulatory Visit: Payer: Self-pay | Admitting: Nurse Practitioner

## 2023-09-21 ENCOUNTER — Telehealth: Payer: Self-pay | Admitting: Pharmacy Technician

## 2023-09-21 NOTE — Telephone Encounter (Signed)
 Pharmacy comment: Alternative Requested:THE PRESCRIBED MEDICATION IS NOT COVERED BY INSURANCE. PLEASE CONSIDER CHANGING TO ONE OF THE SUGGESTED COVERED ALTERNATIVES.

## 2023-09-21 NOTE — Telephone Encounter (Signed)
 PER RX REQ  Pharmacy Patient Advocate Encounter   Received notification from RX Request Messages that prior authorization for PRALUENT  is required/requested.   Insurance verification completed.   The patient is insured through [employer] .   Per test claim: PA required; PA submitted to above mentioned insurance via Latent Key/confirmation #/EOC [id number] Status is pending

## 2023-09-21 NOTE — Telephone Encounter (Signed)
 Pharmacy Patient Advocate Encounter  Received notification from [employer] that Prior Authorization for praluent  has been APPROVED from 09/21/23 to 09/20/24   PA #/Case ID/Reference #: [id number]

## 2023-09-25 ENCOUNTER — Other Ambulatory Visit: Payer: Self-pay

## 2023-09-25 NOTE — Telephone Encounter (Signed)
Routed to Pharm D

## 2023-09-26 MED ORDER — PRALUENT 150 MG/ML ~~LOC~~ SOAJ: 150.0000 mg | SUBCUTANEOUS | 3 refills | Status: AC

## 2023-09-26 NOTE — Addendum Note (Signed)
 Addended by: PERCY ROSALINE HERO on: 09/26/2023 08:10 AM   Modules accepted: Orders

## 2023-10-04 ENCOUNTER — Ambulatory Visit (HOSPITAL_COMMUNITY): Attending: Internal Medicine

## 2023-10-04 DIAGNOSIS — R0602 Shortness of breath: Secondary | ICD-10-CM | POA: Diagnosis not present

## 2023-10-04 DIAGNOSIS — R0609 Other forms of dyspnea: Secondary | ICD-10-CM | POA: Insufficient documentation

## 2023-10-04 DIAGNOSIS — I25118 Atherosclerotic heart disease of native coronary artery with other forms of angina pectoris: Secondary | ICD-10-CM | POA: Insufficient documentation

## 2023-10-06 DIAGNOSIS — H00015 Hordeolum externum left lower eyelid: Secondary | ICD-10-CM | POA: Diagnosis not present

## 2023-10-06 DIAGNOSIS — H10502 Unspecified blepharoconjunctivitis, left eye: Secondary | ICD-10-CM | POA: Diagnosis not present

## 2023-11-17 DIAGNOSIS — R3912 Poor urinary stream: Secondary | ICD-10-CM | POA: Diagnosis not present

## 2023-11-17 DIAGNOSIS — N401 Enlarged prostate with lower urinary tract symptoms: Secondary | ICD-10-CM | POA: Diagnosis not present

## 2023-11-17 DIAGNOSIS — N210 Calculus in bladder: Secondary | ICD-10-CM | POA: Diagnosis not present

## 2023-11-30 DIAGNOSIS — Z23 Encounter for immunization: Secondary | ICD-10-CM | POA: Diagnosis not present

## 2023-12-20 ENCOUNTER — Other Ambulatory Visit (INDEPENDENT_AMBULATORY_CARE_PROVIDER_SITE_OTHER)

## 2023-12-20 DIAGNOSIS — R0609 Other forms of dyspnea: Secondary | ICD-10-CM

## 2023-12-20 DIAGNOSIS — I340 Nonrheumatic mitral (valve) insufficiency: Secondary | ICD-10-CM | POA: Diagnosis not present

## 2023-12-20 LAB — ECHOCARDIOGRAM COMPLETE
AV Vena cont: 0.48 cm
Area-P 1/2: 2.13 cm2
P 1/2 time: 852 ms
S' Lateral: 3.19 cm

## 2023-12-22 DIAGNOSIS — H401122 Primary open-angle glaucoma, left eye, moderate stage: Secondary | ICD-10-CM | POA: Diagnosis not present

## 2023-12-22 DIAGNOSIS — H401111 Primary open-angle glaucoma, right eye, mild stage: Secondary | ICD-10-CM | POA: Diagnosis not present

## 2024-01-23 ENCOUNTER — Telehealth (HOSPITAL_COMMUNITY): Payer: Self-pay | Admitting: Pharmacist

## 2024-01-23 NOTE — Addendum Note (Signed)
 Addended by: DAYNE SHERRY RAMAN on: 01/23/2024 01:00 PM   Modules accepted: Orders

## 2024-01-23 NOTE — Telephone Encounter (Signed)
 Infliximab  treatment plan discontinued due to reaction to treatment (severe arthritis)  Shiana Rappleye, PharmD, MPH, BCPS, CPP Clinical Pharmacist

## 2024-01-26 ENCOUNTER — Ambulatory Visit: Admitting: Gastroenterology

## 2024-01-26 ENCOUNTER — Ambulatory Visit: Admitting: Family Medicine

## 2024-01-26 ENCOUNTER — Ambulatory Visit
Admission: RE | Admit: 2024-01-26 | Discharge: 2024-01-26 | Disposition: A | Source: Ambulatory Visit | Attending: Family Medicine

## 2024-01-26 ENCOUNTER — Encounter: Payer: Self-pay | Admitting: Family Medicine

## 2024-01-26 ENCOUNTER — Encounter: Payer: Self-pay | Admitting: Gastroenterology

## 2024-01-26 VITALS — BP 110/60 | HR 50 | Ht 68.5 in | Wt 149.2 lb

## 2024-01-26 VITALS — BP 106/74 | HR 48 | Temp 98.5°F | Ht 68.5 in | Wt 149.0 lb

## 2024-01-26 DIAGNOSIS — Z9181 History of falling: Secondary | ICD-10-CM

## 2024-01-26 DIAGNOSIS — H5500 Unspecified nystagmus: Secondary | ICD-10-CM

## 2024-01-26 DIAGNOSIS — R42 Dizziness and giddiness: Secondary | ICD-10-CM

## 2024-01-26 DIAGNOSIS — R7303 Prediabetes: Secondary | ICD-10-CM

## 2024-01-26 DIAGNOSIS — R1310 Dysphagia, unspecified: Secondary | ICD-10-CM | POA: Diagnosis not present

## 2024-01-26 DIAGNOSIS — K5190 Ulcerative colitis, unspecified, without complications: Secondary | ICD-10-CM

## 2024-01-26 DIAGNOSIS — K222 Esophageal obstruction: Secondary | ICD-10-CM

## 2024-01-26 DIAGNOSIS — S060X0D Concussion without loss of consciousness, subsequent encounter: Secondary | ICD-10-CM | POA: Diagnosis not present

## 2024-01-26 DIAGNOSIS — R2689 Other abnormalities of gait and mobility: Secondary | ICD-10-CM | POA: Diagnosis not present

## 2024-01-26 DIAGNOSIS — R1319 Other dysphagia: Secondary | ICD-10-CM | POA: Insufficient documentation

## 2024-01-26 LAB — CBC WITH DIFFERENTIAL/PLATELET
Basophils Absolute: 0.1 K/uL (ref 0.0–0.1)
Basophils Relative: 1 % (ref 0.0–3.0)
Eosinophils Absolute: 0.2 K/uL (ref 0.0–0.7)
Eosinophils Relative: 2.8 % (ref 0.0–5.0)
HCT: 46.3 % (ref 39.0–52.0)
Hemoglobin: 15.6 g/dL (ref 13.0–17.0)
Lymphocytes Relative: 34.9 % (ref 12.0–46.0)
Lymphs Abs: 2.4 K/uL (ref 0.7–4.0)
MCHC: 33.6 g/dL (ref 30.0–36.0)
MCV: 98.4 fl (ref 78.0–100.0)
Monocytes Absolute: 0.7 K/uL (ref 0.1–1.0)
Monocytes Relative: 9.4 % (ref 3.0–12.0)
Neutro Abs: 3.6 K/uL (ref 1.4–7.7)
Neutrophils Relative %: 51.9 % (ref 43.0–77.0)
Platelets: 176 K/uL (ref 150.0–400.0)
RBC: 4.7 Mil/uL (ref 4.22–5.81)
RDW: 14.7 % (ref 11.5–15.5)
WBC: 6.9 K/uL (ref 4.0–10.5)

## 2024-01-26 LAB — COMPREHENSIVE METABOLIC PANEL WITH GFR
ALT: 14 U/L (ref 3–53)
AST: 24 U/L (ref 5–37)
Albumin: 4.3 g/dL (ref 3.5–5.2)
Alkaline Phosphatase: 47 U/L (ref 39–117)
BUN: 26 mg/dL — ABNORMAL HIGH (ref 6–23)
CO2: 32 meq/L (ref 19–32)
Calcium: 9.8 mg/dL (ref 8.4–10.5)
Chloride: 105 meq/L (ref 96–112)
Creatinine, Ser: 1.06 mg/dL (ref 0.40–1.50)
GFR: 70.46 mL/min
Glucose, Bld: 92 mg/dL (ref 70–99)
Potassium: 4.3 meq/L (ref 3.5–5.1)
Sodium: 140 meq/L (ref 135–145)
Total Bilirubin: 1 mg/dL (ref 0.2–1.2)
Total Protein: 7.3 g/dL (ref 6.0–8.3)

## 2024-01-26 NOTE — Progress Notes (Signed)
 "  Acute Office Visit  Subjective:     Patient ID: Joseph Booker, male    DOB: December 12, 1950, 74 y.o.   MRN: [id number]  No chief complaint on file.   HPI  Discussed the use of AI scribe software for clinical note transcription with the patient, who gave verbal consent to proceed.  History of Present Illness Joseph Booker is a 74 year old male who presents with dizziness and unsteadiness following a fall.  Dizziness and unsteadiness - Dizziness and significant unsteadiness began after a fall on January 1st - Symptoms have persisted for a week and may be worsening - Unsteadiness is worse when walking his dog due to fear of being pulled over - No headache, nausea, or vomiting since the fall - No new vision changes, diplopia, blurred vision, new tinnitus, increased sleepiness, or new numbness or tingling since the fall  Fall-related injuries - Sustained facial and hand injuries during the fall, including a scab near the eye and bleeding from the cheekbone - Urgent care evaluation included a normal EKG - Uncertain if there was brief loss of consciousness  Cardiac symptoms - Long-standing low heart rate without palpitations  Medication history - Recently completed a 30-day course of acyclovir for an eye condition with tearing and redness - Discontinued acyclovir due to lack of benefit and concern it might contribute to dizziness - No other recent medication changes  Chronic neurological symptoms - Chronic numbness and tingling in toes for years with prior nonrevealing MRIs - Constant tinnitus  Metabolic findings - Slightly elevated blood sugars consistent with prediabetes - Family history of diabetes     ROS Per HPI      Objective:    BP 106/74 (BP Location: Left Arm, Patient Position: Sitting, Cuff Size: Normal)   Pulse (!) 48   Temp 98.5 F (36.9 C) (Temporal)   Ht 5' 8.5 (1.74 m)   Wt 149 lb (67.6 kg)   SpO2 97%   BMI 22.33 kg/m    Physical Exam Vitals and  nursing note reviewed.  Constitutional:      General: He is not in acute distress.    Appearance: Normal appearance.  HENT:     Head: Normocephalic and atraumatic.      Comments: Area of bruising to L lateral orbit, non tender    Right Ear: External ear normal.     Left Ear: External ear normal.     Nose: Nose normal.     Mouth/Throat:     Mouth: Mucous membranes are moist.     Pharynx: Oropharynx is clear.  Eyes:     Extraocular Movements: Extraocular movements intact.  Cardiovascular:     Rate and Rhythm: Normal rate and regular rhythm.     Pulses: Normal pulses.     Heart sounds: Normal heart sounds.  Pulmonary:     Effort: Pulmonary effort is normal. No respiratory distress.     Breath sounds: Normal breath sounds. No wheezing, rhonchi or rales.  Musculoskeletal:        General: Normal range of motion.     Cervical back: Normal range of motion.     Right lower leg: No edema.     Left lower leg: No edema.  Lymphadenopathy:     Cervical: No cervical adenopathy.  Skin:    General: Skin is warm and dry.  Neurological:     General: No focal deficit present.     Mental Status: He is alert and oriented to person, place, and  time.     Comments: Nystagmus with cardinal gaze  Psychiatric:        Mood and Affect: Mood normal.        Behavior: Behavior normal.     No results found for any visits on 01/26/24.      Assessment & Plan:   Assessment and Plan Assessment & Plan Concussion with dizziness and balance problem, recent fall, nystagmus, concussion Concussion suspected with persistent dizziness and balance disturbance. Differential includes anemia, electrolyte imbalance, or liver function issues. CT scan necessary to rule out serious conditions. - Ordered head CT to evaluate for bleeding, bruising, or orbital fractures. - Ordered blood tests for anemia, electrolyte imbalances, and liver function. - Advised rest and gentle activity. - Encouraged adequate fluid  intake. - With worsening balance, need to r/o ICH  Prediabetes Fasting blood sugar levels around 99-100 mg/dL and J8r of 4.2%. Family history of diabetes. - Continue monitoring blood sugar levels. - Maintain dietary caution regarding sugar intake.     Orders Placed This Encounter  Procedures   CT HEAD WO CONTRAST ( )    Standing Status:   Future    Expiration Date:   01/25/2025    Preferred imaging location?:   [facility]   CBC with Differential/Platelet    Release to patient:   Immediate [1]   Comprehensive metabolic panel with GFR    Release to patient:   Immediate [1]     No orders of the defined types were placed in this encounter.   No follow-ups on file.  Corean LITTIE Ku, FNP  "

## 2024-01-26 NOTE — Progress Notes (Signed)
 "    01/26/2024 Joseph Booker [id number] January 25, 1950   Discussed the use of AI scribe software for clinical note transcription with the patient, who gave verbal consent to proceed.  History of Present Illness Joseph Booker is a 74 year old male with longstanding ulcerative colitis and benign esophageal stricture who presents for surveillance colonoscopy and evaluation of dysphagia.  Ulcerative colitis is managed with Lialda  4 tablets daily and Rowasa  enemas as needed, though enemas have not been used recently. Disease is fairly well controlled with soft stools two to three times daily and occasional urgency, which he considers his baseline. Nocturnal symptoms are rare. He denies blood or mucus in the stool. Remicade  previously improved symptoms but later lost efficacy. Last fecal calprotectin in December 2024 was 43. He is overdue for surveillance colonoscopy and is aware of increased cancer risk associated with chronic inflammation. He discussed the potential use of Rinvoq and recent changes in Medicare coverage.  Says that he can live and manage with current symptoms but   Intermittent dysphagia to solid foods with occasional choking episodes. Prior endoscopic evaluation in 2017 identified a benign esophageal stricture, which was dilated witout food response. He feels dysphagia has probably worsened since that time. He denies heartburn, reflux, indigestion, weight loss, or gastrointestinal bleeding.  On January 18, 2024, he fell flat on his face after tripping during a walk, resulting in facial bruising and a possible concussion. He was evaluated at urgent care, where an EKG was performed and no acute cardiac issues were identified. He attributes the fall to dizziness possibly related to acyclovir, which he was taking for a left eye problem and has since discontinued. Since the fall, he feels his equilibrium is totally off. He is not on blood thinners other than aspirin . No acute worsening of symptoms has  occurred since the fall.  Colonoscopy April 17, 2020 - The perianal and digital rectal examinations were normal. - Inflammation characterized by altered vascularity, congestion (edema), erythema and scarring was found as medium patches surrounded by normal mucosa in the rectum, in the descending colon, in the splenic flexure, in the transverse colon, in the ascending colon and at the cecum. This was mild in severity, and when compared to previous examinations, the findings are worsened. Biopsies were taken with a cold forceps for histology. - Non-bleeding internal hemorrhoids were found during retroflexion. The hemorrhoids were medium-sized.   1. Surgical [P], right colon - MILDLY ACTIVE CHRONIC COLITIS, CONSISTENT WITH PATIENT'S CLINICAL HISTORY OF ULCERATIVE COLITIS - NEGATIVE FOR GRANULOMAS OR DYSPLASIA 2. Surgical [P], left colon - COLONIC MUCOSA WITH NO SPECIFIC HISTOPATHOLOGIC CHANGES - NEGATIVE FOR ACUTE INFLAMMATION, FEATURES OF CHRONICITY, GRANULOMAS OR DYSPLASIA    Flexible sigmoidoscopy 09/28/2021 - The perianal and digital rectal examinations were normal. - A continuous area of ulcerated mucosa with stigmata of recent bleeding was present in the rectum extending to rectosigmoid. Biopsies were taken with a cold forceps for histology. - Normal mucosa was found sigmoid colon to visualized extent in the descending colon. Retroflexion not performed in the rectum due to proctitis. 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 10% in-stent restenosis, minimal  other nonobstructive coronary disease, ejection fraction 55%., excellent result; ETT 9/13: ex 13:14, no CP, no ischemic ECG changes   Cancer (HCC)    basal cell carcinoma x2 on face   Cataract    surgery to both eyes   Elevated blood pressure reading 05/15/2020   Eye infection    Fall    Glaucoma, left eye    left eye   History of exercise intolerance 05/23/2013   cardiopulmonary exercise test-- normal functional capacity when compared to matched sedentary norms, borderline ventilatory limitation with the patient's exercise tidal volume reaching 90% of the measured inspiratory capacity ( pt reports no dyspnea), did not appear to be circulatory limitation, normal CPX test   Hyperlipidemia    Nocturia    Osteopenia    Pure hypercholesterolemia 06/12/2009   Thoracic ascending aortic aneurysm    followed by dr Annabella Scarce---  last CT 12-29-2016 ,  4.5cm   Ulcerative colitis, universal (HCC)    GI-- dr shila--  dx 1990s  (per note mild Mayo Score 1)   Wears glasses    Past Surgical History:  Procedure Laterality Date   CARDIAC CATHETERIZATION  03/26/2010    dr ethridge   single vessel CAD w/ patent mLAD stent w/ 10% in-stent restenosis w/ mild to moderate disease in the remainder LAD w/ no lesions that appearedto be flow-limitinf/  normal LVFSF, ef 55%   CARDIOVASCULAR STRESS TEST  06-30-2016   dr delford   Low risk nuclear study w/ no evidence ishcemia/  normal LV function and wall motion,  nuclear stress ef 55%   COLONOSCOPY  last one 10-27-2016   CORONARY ANGIOPLASTY WITH STENT PLACEMENT  07-25-2002   dr malva   PTCA w/  DES x1 to mLAD   EYE SURGERY     cataracts   INGUINAL HERNIA REPAIR Left 2006   INGUINAL HERNIA REPAIR Right 07/03/2017   Procedure: OPEN REPAIR OF RIGHT INGUINAL HERNIA WITH MESH;  Surgeon: Kinsinger, Herlene Righter, MD;  Location: [facility];  Service: General;  Laterality: Right;   INSERTION OF MESH Right 07/03/2017   Procedure: INSERTION OF  MESH;  Surgeon: Stevie Herlene Righter, MD;  Location: [facility] [place];  Service: General;  Laterality: Right;   LEFT HEART CATH AND CORONARY ANGIOGRAPHY N/A 09/11/2020   Procedure: LEFT HEART CATH AND CORONARY ANGIOGRAPHY;  Surgeon: Dann Candyce RAMAN, MD;  Location: [facility] INVASIVE CV LAB;  Service: Cardiovascular;  Laterality: N/A;   LEFT HEART CATH AND CORONARY ANGIOGRAPHY N/A 01/05/2023   Procedure: LEFT HEART CATH AND CORONARY ANGIOGRAPHY;  Surgeon: Jordan, Peter M, MD;  Location: [facility] INVASIVE CV LAB;  Service: Cardiovascular;  Laterality: N/A;   SEPTOPLASTY  1987   w/ RHINOPLASTY   THORACIC AORTOGRAM N/A 09/11/2020   Procedure: THORACIC AORTOGRAM;  Surgeon: Dann Candyce RAMAN, MD;  Location: [facility] INVASIVE CV LAB;  Service: Cardiovascular;  Laterality: N/A;   TONSILLECTOMY  1959   TRANSTHORACIC ECHOCARDIOGRAM  06-30-2016     dr delford commons 55-60%/  trivial AR/ mild dilated ascending aorta, 14mm/  trivial MR/  mild PR and TR     reports that he has never smoked. He has never used smokeless tobacco. He reports current alcohol use. He reports that he does not use drugs. family history includes Arthritis in his mother; Asthma in his sister; CAD in his mother; Diabetes in his mother; Heart attack in his  father; Heart disease in his mother; Heart disease (age of onset: 31) in his father; Lung disease in his mother; Rheum arthritis in his mother. Allergies[1]    Outpatient Encounter Medications as of 01/26/2024  Medication Sig   Alirocumab  (PRALUENT ) 150 MG/ML SOAJ Inject 1 mL (150 mg total) into the skin every 14 (fourteen) days.   aspirin  81 MG tablet Take 81 mg by mouth at bedtime.   Calcium  Carb-Cholecalciferol (CALCIUM +D3) 600-20 MG-MCG TABS Take 1 tablet by mouth daily.   Coenzyme Q10 (CO Q-10) 300 MG CAPS Take 300 mg by mouth every morning.   Lactase (LACTAID FAST ACT) 9000 units TABS Take 9,000 Units by mouth daily as needed (consuming dairy).   mesalamine  (LIALDA ) 1.2 g EC  tablet Take 4 tablets (4.8 g total) by mouth daily with breakfast.   mesalamine  (ROWASA ) 4 g enema Place 60 mLs (4 g total) rectally at bedtime. (Patient taking differently: Place 4 g rectally as needed.)   Multiple Vitamins-Minerals (MULTIVITAMIN WITH MINERALS) tablet Take 1 tablet by mouth daily.   timolol (TIMOPTIC) 0.5 % ophthalmic solution Place 1 drop into both eyes 2 (two) times daily.   No facility-administered encounter medications on file as of 01/26/2024.     REVIEW OF SYSTEMS  : All other systems reviewed and negative except where noted in the History of Present Illness.   PHYSICAL EXAM: BP 110/60   Pulse (!) 50   Ht 5' 8.5 (1.74 m)   Wt 149 lb 4 oz (67.7 kg)   BMI 22.36 kg/m  General: Well developed white male in no acute distress Head: Normocephalic and atraumatic Eyes:  Sclerae anicteric, conjunctiva pink. Ears: Normal auditory acuity Lungs: Clear throughout to auscultation Heart: Bradycardic, but regular rhythm. Abdomen: Soft, non-distended.  BS present.  Non-tender. Rectal:  Will be done at the full recovery from his recent fall.  Of colonoscopy. Musculoskeletal: Symmetrical with no gross deformities  Skin: No lesions on visible extremities Extremities: No edema  Neurological: Alert oriented x 4, grossly non-focal Psychological:  Alert and cooperative. Normal mood and affect  Assessment & Plan Ulcerative colitis Chronic, longstanding ulcerative colitis currently on mesalamine  4.8 grams daily and as-needed Rowasa  enemas. Due to disease chronicity, he remains at risk for ongoing endoscopic inflammation and increased colorectal cancer risk, necessitating surveillance and possible escalation of therapy if significant inflammation is detected. - Scheduled surveillance colonoscopy to assess mucosal disease activity and colorectal cancer risk.  Scheduled with Dr. Shila. - Deferred fecal calprotectin; will reassess after colonoscopy results (has been normal in the  past). - Discussed potential escalation to Rinvoq if colonoscopy reveals significant inflammation. - Ordered cardiac clearance prior to colonoscopy due to valvular regurgitation and recent cardiac MRI findings.  Benign esophageal stricture with dysphagia Chronic, recurrent benign esophageal stricture with progressive dysphagia to solids and intermittent choking, without alarm symptoms. Prior dilation provided only minimal/temporary improvement; repeat intervention may be required for lasting benefit. - Planned upper endoscopy (EGD) with possible dilation to be performed concurrently with scheduled colonoscopy. - Reviewed prior dilation history and discussed the likelihood of requiring repeated dilations for sustained improvement.  **The risks, benefits, and alternatives to EGD and colonoscopy were discussed with the patient and he consents to proceed.  His procedures were scheduled for mid February to allow time for recovery from his recent fall.  **Advised patient to follow-up with PCP for ongoing complaints of imbalance, etc. after his fall 1 week ago.  CC:  Geofm Glade PARAS, MD       [  1]  Allergies Allergen Reactions   Lactose Intolerance (Gi)     bloating   Lipitor [Atorvastatin ] Other (See Comments)    transaminits Transaminits, causes liver enzymes to go up   "

## 2024-01-26 NOTE — Patient Instructions (Addendum)
 I have ordered a head CT for you to further evaluate your dizziness after your fall. Someone should be reaching out to get you scheduled.   We are checking labs today, will be in contact with any results that require further attention.  Follow-up with me for new or worsening symptoms.

## 2024-01-26 NOTE — Patient Instructions (Signed)
 It has been recommended to you by your physician that you have a(n) EGD/Colonoscopy completed. Per your request, we did not schedule the procedure(s) today. Please contact our office at [phone] should you decide to have the procedure completed. You will be scheduled for a pre-visit and procedure at that time.  _______________________________________________________  If your blood pressure at your visit was 140/90 or greater, please contact your primary care physician to follow up on this.  _______________________________________________________  If you are age 72 or older, your body mass index should be between 23-30. Your Body mass index is 22.36 kg/m. If this is out of the aforementioned range listed, please consider follow up with your Primary Care Provider.  If you are age 70 or younger, your body mass index should be between 19-25. Your Body mass index is 22.36 kg/m. If this is out of the aformentioned range listed, please consider follow up with your Primary Care Provider.   ________________________________________________________  The [facility] providers would like to encourage you to use MYCHART to communicate with providers for non-urgent requests or questions.  Due to long hold times on the telephone, sending your provider a message by [facility] may be a faster and more efficient way to get a response.  Please allow 48 business hours for a response.  Please remember that this is for non-urgent requests.  _______________________________________________________  [facility] is using a team-based approach to care.  Your team is made up of your doctor and two to three APPS. Our APPS (Nurse Practitioners and Physician Assistants) work with your physician to ensure care continuity for you. They are fully qualified to address your health concerns and develop a treatment plan. They communicate directly with your gastroenterologist to care for you. Seeing the [facility]  Practitioners on your physician's team can help you by facilitating care more promptly, often allowing for earlier appointments, access to diagnostic testing, procedures, and other specialty referrals.

## 2024-01-28 ENCOUNTER — Ambulatory Visit: Payer: Self-pay | Admitting: Family Medicine

## 2024-02-14 ENCOUNTER — Ambulatory Visit: Payer: Self-pay

## 2024-02-14 NOTE — Telephone Encounter (Signed)
" °  FYI Only or Action Required?: FYI only for provider: appointment scheduled on 1/29.  Patient was last seen in primary care on 01/26/2024 by Alvia Corean CROME, FNP.  Called Nurse Triage reporting No chief complaint on file..  Symptoms began several weeks ago.  Interventions attempted: Nothing.  Symptoms are: gradually worsening.  Triage Disposition: See PCP When Office is Open (Within 3 Days)  Patient/caregiver understands and will follow disposition?: Yes Reason for Triage: Patient has loss of balance been going on for a month and getting worst  Reason for Disposition  [1] MODERATE dizziness (e.g., interferes with normal activities) AND [2] has been evaluated by doctor (or NP/PA) for this  Answer Assessment - Initial Assessment Questions Caller states continues to have dizziness/ balance issues since head injury several weeks ago  1. DESCRIPTION: Describe your dizziness.     Off balance  3. VERTIGO: Do you feel like either you or the room is spinning or tilting? (i.e., vertigo)     denies 4. SEVERITY: How bad is it?  Do you feel like you are going to faint? Can you stand and walk?      5. ONSET:  When did the dizziness begin?     About a month ago 6. AGGRAVATING FACTORS: Does anything make it worse? (e.g., standing, change in head position)     movement 7. HEART RATE: Can you tell me your heart rate? How many beats in 15 seconds?  (Note: Not all patients can do this.)       45 8. CAUSE: What do you think is causing the dizziness? (e.g., decreased fluids or food, diarrhea, emotional distress, heat exposure, new medicine, sudden standing, vomiting; unknown)     unknown  Protocols used: Dizziness - Lightheadedness-A-AH  "

## 2024-02-15 ENCOUNTER — Ambulatory Visit: Admitting: Family Medicine

## 2024-02-15 ENCOUNTER — Encounter: Payer: Self-pay | Admitting: Family Medicine

## 2024-02-15 VITALS — BP 108/68 | HR 46 | Temp 97.7°F | Ht 68.5 in | Wt 151.0 lb

## 2024-02-15 DIAGNOSIS — Z9181 History of falling: Secondary | ICD-10-CM | POA: Diagnosis not present

## 2024-02-15 DIAGNOSIS — R001 Bradycardia, unspecified: Secondary | ICD-10-CM | POA: Diagnosis not present

## 2024-02-15 DIAGNOSIS — R42 Dizziness and giddiness: Secondary | ICD-10-CM | POA: Diagnosis not present

## 2024-02-15 DIAGNOSIS — R2689 Other abnormalities of gait and mobility: Secondary | ICD-10-CM

## 2024-02-15 NOTE — Patient Instructions (Signed)
 I ordered vestibular rehab. You will receive a call to schedule this. Let us  know if you have not heard from anyone by next Tuesday please.   In the meantime, if you have any new or worsening symptoms, let us  know or go to the emergency department.     Dizziness Dizziness is a common problem. It makes you feel unsteady or light-headed. You may feel like you're about to faint. Dizziness can lead to getting hurt if you stumble or fall. It's more common to feel dizzy if you're an older adult. Many things can cause you to feel dizzy. These include: Medicines. Dehydration. This is when there's not enough water in your body. Illness. Follow these instructions at home: Eating and drinking  Drink enough fluid to keep your pee (urine) pale yellow. This helps keep you from getting dehydrated. Try to drink more clear fluids, such as water. Do not drink alcohol. Try to limit how much caffeine you take in. Try to limit how much salt, also called sodium, you take in. Activity Try not to make quick movements. Stand up slowly from sitting in a chair. Steady yourself until you feel okay. In the morning, first sit up on the side of the bed. When you feel okay, hold onto something and slowly stand up. Do this until you know that your balance is okay. If you need to stand in one place for a long time, move your legs often. Tighten and relax the muscles in your legs while you're standing. Do not drive or use machines if you feel dizzy. Avoid bending down if you feel dizzy. Place items in your home so you can reach them without leaning over. Lifestyle Do not smoke, vape, or use products with nicotine or tobacco in them. If you need help quitting, talk with your health care provider. Try to lower your stress level. You can do this by using methods like yoga or meditation. Talk with your provider if you need help. General instructions Watch your dizziness for any changes. Take your medicines only as told by  your provider. Talk with your provider if you think you're dizzy because of a medicine you're taking. Tell a friend or a family member that you're feeling dizzy. If they spot any changes in your behavior, have them call your provider. Contact a health care provider if: Your dizziness doesn't go away, or you have new symptoms. Your dizziness gets worse. You feel like you may vomit. You have trouble hearing. You have a fever. You have neck pain or a stiff neck. You fall or get hurt. Get help right away if: You vomit each time you eat or drink. You have watery poop and can't eat or drink. You have trouble talking, walking, swallowing, or using your arms, hands, or legs. You feel very weak. You're bleeding. You're not thinking clearly, or you have trouble forming sentences. A friend or family member may spot this. Your vision changes, or you get a very bad headache. These symptoms may be an emergency. Call 911 right away. Do not wait to see if the symptoms will go away. Do not drive yourself to the hospital. This information is not intended to replace advice given to you by your health care provider. Make sure you discuss any questions you have with your health care provider. Document Revised: 10/06/2022 Document Reviewed: 02/17/2022 Elsevier Patient Education  2024 [employer].

## 2024-02-18 ENCOUNTER — Encounter: Payer: Self-pay | Admitting: Gastroenterology

## 2024-02-18 DIAGNOSIS — R1319 Other dysphagia: Secondary | ICD-10-CM

## 2024-02-18 DIAGNOSIS — K51919 Ulcerative colitis, unspecified with unspecified complications: Secondary | ICD-10-CM

## 2024-02-21 ENCOUNTER — Encounter: Payer: Self-pay | Admitting: Internal Medicine

## 2024-02-21 ENCOUNTER — Telehealth: Payer: Self-pay | Admitting: *Deleted

## 2024-02-21 MED ORDER — NA SULFATE-K SULFATE-MG SULF 17.5-3.13-1.6 GM/177ML PO SOLN: 1.0000 | Freq: Once | ORAL | 0 refills | Status: AC

## 2024-02-21 NOTE — Telephone Encounter (Signed)
 Pt was scheduled in office 01/26/24.

## 2024-02-21 NOTE — Telephone Encounter (Signed)
 [facility] Pre-operative Risk Assessment     Request for surgical clearance:     Endoscopy Procedure  What type of surgery is being performed?     EGD/Colonoscopy  When is this surgery scheduled?     03/06/24  What type of clearance is required ?   Cardiac  Practice name and name of physician performing surgery?      [facility]  What is your office phone and fax number?      Phone- [phone]  Fax- [phone]  Anesthesia type (None, local, MAC, general) ?       MAC   Please route your response to [name] Misty, CMA

## 2024-02-21 NOTE — Telephone Encounter (Signed)
" ° °  Name: Joseph Booker  DOB: [birth year]  MRN: [id number]  Primary Cardiologist: Annabella Scarce, MD  Chart reviewed as part of pre-operative protocol coverage. Because of Zaheer Wageman past medical history and time since last visit, he will require a follow-up in-office visit in order to better assess preoperative cardiovascular risk.  Pre-op covering staff: - Please schedule appointment and call patient to inform them. If patient already had an upcoming appointment within acceptable timeframe, please add pre-op clearance to the appointment notes so provider is aware. - Please contact requesting surgeon's office via preferred method (i.e, phone, fax) to inform them of need for appointment prior to surgery.  Aspirin  should be continued throughout procedure.   Orren LOISE Fabry, PA-C  02/21/2024, 4:41 PM   "

## 2024-02-21 NOTE — Telephone Encounter (Signed)
 Called patient to schedule IN OFFICE VISIT unfortunately his schedule did not work with any of the dates and times I offered patient mention if he wasn't able to get in before procedure date he could call surgeon's office and move it to next month patient asked if we could keep an eye out if any cancellations came up or openings before the end of the week made patient aware he can also call our office and check if anything is available but we could make a note of it he just needs to understand we offered appointments but patient declined and he needs to be see to provide cardiac clearance he voiced understanding

## 2024-02-22 NOTE — Telephone Encounter (Signed)
 Patient has been scheduled for cardiac clearance appointment is scheduled for 2/16 with Rosaline Bane, NP

## 2024-02-23 ENCOUNTER — Other Ambulatory Visit: Payer: Self-pay

## 2024-02-23 ENCOUNTER — Ambulatory Visit

## 2024-02-23 DIAGNOSIS — R42 Dizziness and giddiness: Secondary | ICD-10-CM

## 2024-02-23 DIAGNOSIS — R2681 Unsteadiness on feet: Secondary | ICD-10-CM

## 2024-02-23 NOTE — Therapy (Signed)
 " OUTPATIENT PHYSICAL THERAPY VESTIBULAR EVALUATION     Patient Name: Joseph Booker MRN: [id number] DOB:07-Jun-1950, 74 y.o., male Today's Date: 02/23/2024  END OF SESSION:  PT End of Session - 02/23/24 0933     Visit Number 1    Number of Visits 9    Date for Recertification  03/22/24    Authorization Type Medicare    PT Start Time 0930    PT Stop Time 1015    PT Time Calculation (min) 45 min          Past Medical History:  Diagnosis Date   Allergic rhinitis    Allergy    Anxiety state, unspecified    Ascending aortic aneurysm 05/15/2020   Bradycardia 05/03/2021   CAD (coronary artery disease)    [name] / nuclear 2005, excellent tolerance no scar or ischemia, EF 51% / nuclear, February, 2012, no scar or ischemia / catheterization March 26, 2010.. 10% in-stent restenosis, minimal other nonobstructive coronary disease, ejection fraction 55%., excellent result; ETT 9/13: ex 13:14, no CP, no ischemic ECG changes   Cancer (HCC)    basal cell carcinoma x2 on face   Cataract    surgery to both eyes   Elevated blood pressure reading 05/15/2020   Eye infection    Fall    Glaucoma, left eye    left eye   History of exercise intolerance 05/23/2013   cardiopulmonary exercise test-- normal functional capacity when compared to matched sedentary norms, borderline ventilatory limitation with the patient's exercise tidal volume reaching 90% of the measured inspiratory capacity ( pt reports no dyspnea), did not appear to be circulatory limitation, normal CPX test   Hyperlipidemia    Nocturia    Osteopenia    Pure hypercholesterolemia 06/12/2009   Thoracic ascending aortic aneurysm    followed by dr Annabella Scarce---  last CT 12-29-2016 ,  4.5cm   Ulcerative colitis, universal (HCC)    GI-- dr shila--  dx 1990s  (per note mild Mayo Score 1)   Wears glasses    Past Surgical History:  Procedure Laterality Date   CARDIAC CATHETERIZATION  03/26/2010    dr ethridge    single vessel CAD w/ patent mLAD stent w/ 10% in-stent restenosis w/ mild to moderate disease in the remainder LAD w/ no lesions that appearedto be flow-limitinf/  normal LVFSF, ef 55%   CARDIOVASCULAR STRESS TEST  06-30-2016   dr delford   Low risk nuclear study w/ no evidence ishcemia/  normal LV function and wall motion,  nuclear stress ef 55%   COLONOSCOPY  last one 10-27-2016   CORONARY ANGIOPLASTY WITH STENT PLACEMENT  07-25-2002   dr malva   PTCA w/  DES x1 to mLAD   EYE SURGERY     cataracts   INGUINAL HERNIA REPAIR Left 2006   INGUINAL HERNIA REPAIR Right 07/03/2017   Procedure: OPEN REPAIR OF RIGHT INGUINAL HERNIA WITH MESH;  Surgeon: Kinsinger, Herlene Righter, MD;  Location: [facility];  Service: General;  Laterality: Right;   INSERTION OF MESH Right 07/03/2017   Procedure: INSERTION OF MESH;  Surgeon: Stevie Herlene Righter, MD;  Location: [facility] ;  Service: General;  Laterality: Right;   LEFT HEART CATH AND CORONARY ANGIOGRAPHY N/A 09/11/2020   Procedure: LEFT HEART CATH AND CORONARY ANGIOGRAPHY;  Surgeon: Dann Candyce RAMAN, MD;  Location: [facility] INVASIVE CV LAB;  Service: Cardiovascular;  Laterality: N/A;   LEFT HEART CATH AND CORONARY ANGIOGRAPHY N/A 01/05/2023   Procedure:  LEFT HEART CATH AND CORONARY ANGIOGRAPHY;  Surgeon: Jordan, Peter M, MD;  Location: [facility] INVASIVE CV LAB;  Service: Cardiovascular;  Laterality: N/A;   SEPTOPLASTY  1987   w/ RHINOPLASTY   THORACIC AORTOGRAM N/A 09/11/2020   Procedure: THORACIC AORTOGRAM;  Surgeon: Dann Candyce RAMAN, MD;  Location: [facility] INVASIVE CV LAB;  Service: Cardiovascular;  Laterality: N/A;   TONSILLECTOMY  1959   TRANSTHORACIC ECHOCARDIOGRAM  06-30-2016     dr delford   ef 55-60%/  trivial AR/ mild dilated ascending aorta, 65mm/  trivial MR/  mild PR and TR    Patient Active Problem List   Diagnosis Date Noted   Esophageal dysphagia 01/26/2024   Dysuria 09/07/2023   Fatigue 09/07/2023   Numbness and  tingling of foot 09/06/2022   Left hip pain 09/06/2022   Bradycardia 05/03/2021   Ascending aortic aneurysm 05/15/2020   Right inguinal hernia 05/31/2017   Hyperglycemia 02/01/2017   Herpes zoster without complication 05/16/2016   Glaucoma 08/21/2013   Mitral regurgitation 05/23/2013   Shortness of breath 05/13/2013   Dilated aortic root 05/13/2013   Pectus excavatum 05/13/2013   Left leg numbness 05/13/2013   Benign prostatic hyperplasia 07/06/2011   CAD (coronary artery disease)    Pure hypercholesterolemia 06/12/2009   Vitamin D  deficiency 06/04/2009   ALLERGIC RHINITIS 06/04/2009   GERD 06/04/2009   Osteoporosis 05/15/2009   Ulcerative colitis (HCC) 05/17/2007    PCP: Geofm Hastings, MD REFERRING PROVIDER: Lendia Boby CROME, NP-C  REFERRING DIAG:  R42 (ICD-10-CM) - Dizziness    THERAPY DIAG:  Dizziness and giddiness  Unsteadiness on feet  ONSET DATE: possibly before 01/18/24, definite after fall and striking face  Rationale for Evaluation and Treatment: Rehabilitation  SUBJECTIVE:   SUBJECTIVE STATEMENT: Unsure if dizziness was present before the fall or thereafter. Initially attributed to medication but unsure.  Since that time he has been increasingly unsteady.  Denies any sensation of spinning.  Chief complaint being imbalance.  Has hx of bradycardia and notes some instances of lightheadedness with change of position.  Denies any HA or phonophobia but has always been light sensitive. No hx of migraine Pt accompanied by: self  PERTINENT HISTORY: Worsening imbalance and dizziness since a fall on January 1st, 2026 - Unsteadiness is more pronounced with eyes closed - No longer feels safe walking his dog due to unsteadiness - Denies spinning sensation - Occasional brief lightheadedness when standing or moving quickly, especially in the shower, resolving spontaneously - No syncope, vision changes, fatigue, headache, or nausea  Sustained facial and hand injuries during  the fall on January 1st, 2026 - Uncertain about loss of consciousness at the time of the fall - CT head on January 9th, 2026 showed no acute intracranial abnormality - Labs, EKG, and urgent care evaluation after the fall were unremarkableCardiovascular symptoms - Longstanding bradycardia - Occasional palpitations - Intermittent low blood pressure, though office readings are often higher (attributed to white coat effect) - No chest pain or significant shortness of breath   Peripheral neuropathy - Neuropathy with numbness in the feet affecting sensation - Prior MRIs without significant findings  PAIN:  Are you having pain? No  PRECAUTIONS: None  RED FLAGS: None   WEIGHT BEARING RESTRICTIONS: No  FALLS: Has patient fallen in last 6 months? Yes. Number of falls 1  LIVING ENVIRONMENT: Lives with spouse in house, no mobility issues prior to onset, independent in all aspects  PLOF: Independent  PATIENT GOALS:   OBJECTIVE:  Note: Objective measures were  completed at Evaluation unless otherwise noted.  DIAGNOSTIC FINDINGS: BRAIN AND VENTRICLES: CTscan No acute hemorrhage. No evidence of acute infarct. No hydrocephalus. No extra-axial collection. No mass effect or midline shift. Mild generalized volume loss. Similar appearance of the posterior fossa with findings suggestive of mega cisterna magna. Mild calcific atheromatous disease within carotid siphons.  COGNITION: Overall cognitive status: Within functional limits for tasks assessed   POSTURE:  No Significant postural limitations  Cervical ROM:    Active A/PROM (deg) eval  Flexion WFL  Extension WFL  Right lateral flexion limited  Left lateral flexion limited  Right rotation WFL  Left rotation WFL  (Blank rows = not tested)  STRENGTH:   LOWER EXTREMITY MMT:   MMT Right eval Left eval  Hip flexion    Hip abduction    Hip adduction    Hip internal rotation    Hip external rotation    Knee flexion     Knee extension    Ankle dorsiflexion    Ankle plantarflexion    Ankle inversion    Ankle eversion    (Blank rows = not tested)    PATIENT SURVEYS:  DHI: THE DIZZINESS HANDICAP INVENTORY (DHI)  P1. Does looking up increase your problem? 2 = Sometimes  E2. Because of your problem, do you feel frustrated? 2 = Sometimes  F3. Because of your problem, do you restrict your travel for business or recreation?  2 = Sometimes  P4. Does walking down the aisle of a supermarket increase your problems?  0 = No  F5. Because of your problem, do you have difficulty getting into or out of bed?  2 = Sometimes  F6. Does your problem significantly restrict your participation in social activities, such as going out to dinner, going to the movies, dancing, or going to parties? 0 = No  F7. Because of your problem, do you have difficulty reading?  0 = No  P8. Does performing more ambitious activities such as sports, dancing, household chores (sweeping or putting dishes away) increase your problems?  4 = Yes  E9. Because of your problem, are you afraid to leave your home without having without having someone accompany you?  0 = No  E10. Because of your problem have you been embarrassed in front of others?  2 = Sometimes  P11. Do quick movements of your head increase your problem?  2 = Sometimes  F12. Because of your problem, do you avoid heights?  2 = Sometimes  P13. Does turning over in bed increase your problem?  0 = No  F14. Because of your problem, is it difficult for you to do strenuous homework or yard work? 4 = Yes  E15. Because of your problem, are you afraid people may think you are intoxicated? 2 = Sometimes  F16. Because of your problem, is it difficult for you to go for a walk by yourself?  2 = Sometimes  P17. Does walking down a sidewalk increase your problem?  2 = Sometimes  E18.Because of your problem, is it difficult for you to concentrate 0 = No  F19. Because of your problem, is it difficult  for you to walk around your house in the dark? 2 = Sometimes  E20. Because of your problem, are you afraid to stay home alone?  0 = No  E21. Because of your problem, do you feel handicapped? 2 = Sometimes  E22. Has the problem placed stress on your relationships with members of your family or friends?  0 = No  E23. Because of your problem, are you depressed?  2 = Sometimes  F24. Does your problem interfere with your job or household responsibilities?  0 = No  P25. Does bending over increase your problem?  2 = Sometimes  TOTAL 36    DHI Scoring Instructions  The patient is asked to answer each question as it pertains to dizziness or unsteadiness problems, specifically  considering their condition during the last month. Questions are designed to incorporate functional (F), physical  (P), and emotional (E) impacts on disability.   Scores greater than 10 points should be referred to balance specialists for further evaluation.   16-34 Points (mild handicap)  36-52 Points (moderate handicap)  54+ Points (severe handicap)  Minimally Detectable Change: 17 points ([address] [place], 1990)  [place], G. SHAUNNA. and [place], C. W. (1990). The development of the Dizziness Handicap Inventory. Archives of Otolaryngology - Head and Neck Surgery 116(4): [id number].   VESTIBULAR ASSESSMENT:  GENERAL OBSERVATION: wears progressive lense   SYMPTOM BEHAVIOR:  Subjective history: possibly before having fall on 01/18/24, definitely since, no spinning. More imbalance and off kilter. Worse balance in the dark and with activity  Non-Vestibular symptoms: none noted, reports chronic hx of tinnitus  Type of dizziness: Imbalance (Disequilibrium)  Frequency: daily  Duration: seconds  Aggravating factors: Spontaneous, Induced by position change: sit to stand, Induced by motion: occur when walking, bending down to the ground, turning body quickly, turning head quickly, and vigorous activity?, and Worse in the  dark  Relieving factors: slow movements  Progression of symptoms: worse  OCULOMOTOR EXAM:  Ocular Alignment: normal  Ocular ROM: No Limitations  Spontaneous Nystagmus: absent  Gaze-Induced Nystagmus: absent  Smooth Pursuits: intact  Saccades: intact  Convergence/Divergence: 3 cm   Cover-cross-cover test: Normal  FRENZEL - FIXATION SUPRESSED: Will use Infrared goggles at next session for fixation suppressed  VESTIBULAR - OCULAR REFLEX:   Slow VOR: Comment: notes provocation to horizontal and vertical off kilter  VOR Cancellation: Normal  Head-Impulse Test: HIT Right: negative HIT Left: positive  Dynamic Visual Acuity: Not able to be assessed   POSITIONAL TESTING: Right Dix-Hallpike: no nystagmus Left Dix-Hallpike: no nystagmus, but notes feeling off Right Roll Test: noted some beats of right upbeating nystagmus but asymptomatic Left Roll Test: no nystagmus Slightly dizzy with supine to standing  MOTION SENSITIVITY:  Motion Sensitivity Quotient Intensity: 0 = none, 1 = Lightheaded, 2 = Mild, 3 = Moderate, 4 = Severe, 5 = Vomiting  Intensity  1. Sitting to supine   2. Supine to L side   3. Supine to R side   4. Supine to sitting   5. L Hallpike-Dix   6. Up from L    7. R Hallpike-Dix   8. Up from R    9. Sitting, head tipped to L knee   10. Head up from L knee   11. Sitting, head tipped to R knee   12. Head up from R knee   13. Sitting head turns x5   14.Sitting head nods x5   15. In stance, 180 turn to L    16. In stance, 180 turn to R     OTHOSTATICS: not done  FUNCTIONAL GAIT: TBD   M-CTSIB  Condition 1: Firm Surface, EO 30 Sec, Normal Sway  Condition 2: Firm Surface, EC 30 Sec, Moderate Sway  Condition 3: Foam Surface, EO 30 Sec, Normal Sway  Condition 4: Foam Surface, EC 30 Sec, Moderate Sway  TREATMENT DATE:  02/23/24    Gaze Adaptation:  Instructed to perform seated/standing VOR x 1 horizontal/vertical for ongoing self-assessment   PATIENT EDUCATION: Education details: rationale of assessment and need for further assessment. HEP initiation. We discussed vestibular hypofunction given his report of symptoms, balance dysfunction, and + left Head Impulse test Person educated: Patient Education method: Explanation Education comprehension: verbalized understanding  HOME EXERCISE PROGRAM: Access Code: [email or web address] URL: [email or web address] Date: 02/23/2024 Prepared by: Burnard Sandifer  Exercises - Seated Gaze Stabilization with Head Rotation  - 1 x daily - 7 x weekly - 3-5 sets - 15-30 sec hold - Seated Gaze Stabilization with Head Nod  - 1 x daily - 7 x weekly - 3-5 sets - 15-30 sec hold  GOALS: Goals reviewed with patient? Yes  SHORT TERM GOALS: Target date: same as LTG   LONG TERM GOALS: Target date: 03/22/2024    The patient will be independent with HEP for gaze adaptation, habituation, balance, and general mobility. Baseline:  Goal status: INITIAL  2.  Report improved symptoms and reduced dysfunction per score 18% DHI Baseline: 36% Goal status: INITIAL  3.  Demo improved postural control per normal-mild sway condition 2 and 4 M-CTSIB Baseline: mdoerate x 30 sec Goal status: INITIAL  4.  FGA Baseline: TBD Goal status: INITIAL  5.  Pt to report symptoms not hindering daily walks with his dog Baseline: unable Goal status: INITIAL    ASSESSMENT:  CLINICAL IMPRESSION: Patient is a 74 y.o. male who was seen today for physical therapy evaluation and treatment for R42 (ICD-10-CM) - Dizziness.  Neck ROM WFL and slight symptom provocation to cervical extension-flexion and no unusual findings to oculomotor exam.  VOR x 1 provocative to symptoms with reproduction of off kilter feeling he has been experiencing.  +Head Impulse test noted on left and negative on R.   Positional tests not obvious for symptom reproduction other than provoking to off kilter feeling with Dix-Hallpike.  With right roll test observed instances of right upbeating nystagmus but asymptomatic and this was repeated twice at different points of exam and was observed in both instances, but again asymptomatic.  Unable to utilize infrared goggles for fixation suppressed but will perform at next session for comparison.  M-CTSIB revealing for increased (moderate) sway conditions 2 and 4 indicating reduced vestibular input and reduced proprioceptive awareness.    Instructed in seated/standing VOR and provided handout of vestibular hypofunction signs and symptoms for further research and investigation.    OBJECTIVE IMPAIRMENTS: decreased activity tolerance, decreased balance, decreased mobility, and dizziness.   ACTIVITY LIMITATIONS: bending, stairs, reach over head, and locomotion level  PARTICIPATION LIMITATIONS: cleaning, laundry, interpersonal relationship, yard work, and exercise routine  PERSONAL FACTORS: Age, Time since onset of injury/illness/exacerbation, and 1-2 comorbidities: cardiac issues, peripheral neuropathy are also affecting patient's functional outcome.   REHAB POTENTIAL: Good  CLINICAL DECISION MAKING: Evolving/moderate complexity  EVALUATION COMPLEXITY: Moderate   PLAN:  PT FREQUENCY: 1-2x/week  PT DURATION: 4 weeks  PLANNED INTERVENTIONS: 97750- Physical Performance Testing, 97110-Therapeutic exercises, 97530- Therapeutic activity, [id number]- Neuromuscular re-education, 97535- Self Care, [id number]- Manual therapy, [id number]- Gait training, [phone]- Canalith repositioning, and Patient/Family education  PLAN FOR NEXT SESSION: repeat tests with goggles/fixation suppressed. FGA. Balance HEP   1:01 PM, 02/23/24 M. Kelly Kelly Ranieri, PT, DPT Physical Therapist- [place] Office Number: [phone]  "

## 2024-02-28 ENCOUNTER — Ambulatory Visit

## 2024-03-04 ENCOUNTER — Ambulatory Visit (HOSPITAL_BASED_OUTPATIENT_CLINIC_OR_DEPARTMENT_OTHER): Admitting: Nurse Practitioner

## 2024-03-04 ENCOUNTER — Ambulatory Visit

## 2024-03-06 ENCOUNTER — Encounter: Admitting: Gastroenterology

## 2024-04-09 ENCOUNTER — Ambulatory Visit (HOSPITAL_BASED_OUTPATIENT_CLINIC_OR_DEPARTMENT_OTHER): Admitting: Cardiovascular Disease

## 2024-04-10 ENCOUNTER — Ambulatory Visit (HOSPITAL_BASED_OUTPATIENT_CLINIC_OR_DEPARTMENT_OTHER): Admitting: Cardiovascular Disease
